# Patient Record
Sex: Male | Born: 1935 | Race: White | Hispanic: No | Marital: Married | Smoking: Never smoker
Health system: Southern US, Community
[De-identification: ages and names within clinical notes are randomized; demographics above are authoritative.]

## PROBLEM LIST (undated history)

## (undated) DIAGNOSIS — I639 Cerebral infarction, unspecified: Secondary | ICD-10-CM

## (undated) DIAGNOSIS — J449 Chronic obstructive pulmonary disease, unspecified: Secondary | ICD-10-CM

## (undated) DIAGNOSIS — Z87442 Personal history of urinary calculi: Secondary | ICD-10-CM

## (undated) DIAGNOSIS — I10 Essential (primary) hypertension: Secondary | ICD-10-CM

## (undated) DIAGNOSIS — K449 Diaphragmatic hernia without obstruction or gangrene: Secondary | ICD-10-CM

## (undated) DIAGNOSIS — Z950 Presence of cardiac pacemaker: Secondary | ICD-10-CM

## (undated) DIAGNOSIS — I2510 Atherosclerotic heart disease of native coronary artery without angina pectoris: Secondary | ICD-10-CM

## (undated) DIAGNOSIS — M1990 Unspecified osteoarthritis, unspecified site: Secondary | ICD-10-CM

## (undated) DIAGNOSIS — K922 Gastrointestinal hemorrhage, unspecified: Secondary | ICD-10-CM

## (undated) DIAGNOSIS — C801 Malignant (primary) neoplasm, unspecified: Secondary | ICD-10-CM

## (undated) DIAGNOSIS — K222 Esophageal obstruction: Secondary | ICD-10-CM

## (undated) DIAGNOSIS — K219 Gastro-esophageal reflux disease without esophagitis: Secondary | ICD-10-CM

## (undated) DIAGNOSIS — I350 Nonrheumatic aortic (valve) stenosis: Secondary | ICD-10-CM

## (undated) DIAGNOSIS — F0390 Unspecified dementia without behavioral disturbance: Secondary | ICD-10-CM

## (undated) DIAGNOSIS — K5790 Diverticulosis of intestine, part unspecified, without perforation or abscess without bleeding: Secondary | ICD-10-CM

## (undated) DIAGNOSIS — E785 Hyperlipidemia, unspecified: Secondary | ICD-10-CM

## (undated) DIAGNOSIS — I509 Heart failure, unspecified: Secondary | ICD-10-CM

## (undated) HISTORY — DX: Hyperlipidemia, unspecified: E78.5

## (undated) HISTORY — DX: Essential (primary) hypertension: I10

## (undated) HISTORY — DX: Diverticulosis of intestine, part unspecified, without perforation or abscess without bleeding: K57.90

## (undated) HISTORY — DX: Unspecified osteoarthritis, unspecified site: M19.90

## (undated) HISTORY — DX: Diaphragmatic hernia without obstruction or gangrene: K44.9

## (undated) HISTORY — DX: Atherosclerotic heart disease of native coronary artery without angina pectoris: I25.10

## (undated) HISTORY — DX: Esophageal obstruction: K22.2

## (undated) HISTORY — DX: Cerebral infarction, unspecified: I63.9

## (undated) HISTORY — DX: Gastrointestinal hemorrhage, unspecified: K92.2

## (undated) HISTORY — PX: ARTERIOVENOUS GRAFT PLACEMENT W/ ENDOSCOPIC VEIN HARVEST: SUR1030

## (undated) HISTORY — PX: EYE SURGERY: SHX253

---

## 2004-06-09 ENCOUNTER — Inpatient Hospital Stay (HOSPITAL_COMMUNITY): Admission: RE | Admit: 2004-06-09 | Discharge: 2004-06-13 | Payer: Self-pay | Admitting: Orthopedic Surgery

## 2004-06-09 HISTORY — PX: OTHER SURGICAL HISTORY: SHX169

## 2006-08-23 DIAGNOSIS — I252 Old myocardial infarction: Secondary | ICD-10-CM | POA: Insufficient documentation

## 2007-06-16 ENCOUNTER — Ambulatory Visit: Payer: Self-pay | Admitting: Cardiology

## 2007-06-16 ENCOUNTER — Ambulatory Visit: Payer: Self-pay | Admitting: Cardiothoracic Surgery

## 2007-06-16 ENCOUNTER — Inpatient Hospital Stay (HOSPITAL_COMMUNITY): Admission: EM | Admit: 2007-06-16 | Discharge: 2007-06-25 | Payer: Self-pay | Admitting: *Deleted

## 2007-06-19 ENCOUNTER — Encounter (INDEPENDENT_AMBULATORY_CARE_PROVIDER_SITE_OTHER): Payer: Self-pay | Admitting: Neurology

## 2007-06-19 ENCOUNTER — Encounter: Payer: Self-pay | Admitting: Cardiothoracic Surgery

## 2007-06-21 HISTORY — PX: CORONARY ARTERY BYPASS GRAFT: SHX141

## 2007-07-12 ENCOUNTER — Ambulatory Visit: Payer: Self-pay | Admitting: Internal Medicine

## 2007-07-14 ENCOUNTER — Ambulatory Visit: Payer: Self-pay | Admitting: Cardiothoracic Surgery

## 2007-07-14 ENCOUNTER — Encounter: Admission: RE | Admit: 2007-07-14 | Discharge: 2007-07-14 | Payer: Self-pay | Admitting: Cardiothoracic Surgery

## 2007-10-31 ENCOUNTER — Encounter: Payer: Self-pay | Admitting: Internal Medicine

## 2007-10-31 ENCOUNTER — Ambulatory Visit: Payer: Self-pay | Admitting: Internal Medicine

## 2007-10-31 ENCOUNTER — Ambulatory Visit: Payer: Self-pay

## 2007-10-31 LAB — CONVERTED CEMR LAB
ALT: 12 units/L (ref 0–53)
AST: 15 units/L (ref 0–37)
Albumin: 3.5 g/dL (ref 3.5–5.2)
Alkaline Phosphatase: 49 units/L (ref 39–117)
BUN: 12 mg/dL (ref 6–23)
Bilirubin, Direct: 0.1 mg/dL (ref 0.0–0.3)
CO2: 31 meq/L (ref 19–32)
Calcium: 8.9 mg/dL (ref 8.4–10.5)
Chloride: 108 meq/L (ref 96–112)
Cholesterol: 130 mg/dL (ref 0–200)
Creatinine, Ser: 0.9 mg/dL (ref 0.4–1.5)
GFR calc Af Amer: 106 mL/min
GFR calc non Af Amer: 88 mL/min
Glucose, Bld: 119 mg/dL — ABNORMAL HIGH (ref 70–99)
HDL: 46.6 mg/dL (ref >39.0)
LDL Cholesterol: 59 mg/dL (ref 0–99)
Potassium: 4.5 meq/L (ref 3.5–5.1)
Sodium: 141 meq/L (ref 135–145)
Total Bilirubin: 0.6 mg/dL (ref 0.3–1.2)
Total CHOL/HDL Ratio: 2.8
Total Protein: 6.6 g/dL (ref 6.0–8.3)
Triglycerides: 120 mg/dL (ref 0–149)
VLDL: 24 mg/dL (ref 0–40)

## 2008-01-04 ENCOUNTER — Ambulatory Visit: Payer: Self-pay | Admitting: Internal Medicine

## 2008-05-10 ENCOUNTER — Ambulatory Visit: Payer: Self-pay | Admitting: Internal Medicine

## 2008-05-16 ENCOUNTER — Ambulatory Visit: Payer: Self-pay

## 2008-06-14 ENCOUNTER — Emergency Department (HOSPITAL_COMMUNITY): Admission: EM | Admit: 2008-06-14 | Discharge: 2008-06-14 | Payer: Self-pay | Admitting: Emergency Medicine

## 2009-02-05 ENCOUNTER — Ambulatory Visit: Payer: Self-pay | Admitting: Internal Medicine

## 2009-02-05 DIAGNOSIS — Z951 Presence of aortocoronary bypass graft: Secondary | ICD-10-CM | POA: Insufficient documentation

## 2009-02-05 DIAGNOSIS — I10 Essential (primary) hypertension: Secondary | ICD-10-CM | POA: Insufficient documentation

## 2009-02-07 ENCOUNTER — Encounter: Payer: Self-pay | Admitting: Internal Medicine

## 2009-06-28 ENCOUNTER — Telehealth: Payer: Self-pay | Admitting: Adult Health

## 2009-12-04 ENCOUNTER — Ambulatory Visit: Payer: Self-pay | Admitting: Internal Medicine

## 2009-12-04 DIAGNOSIS — R42 Dizziness and giddiness: Secondary | ICD-10-CM | POA: Insufficient documentation

## 2009-12-11 ENCOUNTER — Encounter: Payer: Self-pay | Admitting: Internal Medicine

## 2010-01-15 ENCOUNTER — Ambulatory Visit: Payer: Self-pay | Admitting: Internal Medicine

## 2010-01-30 ENCOUNTER — Telehealth: Payer: Self-pay | Admitting: Internal Medicine

## 2010-02-23 ENCOUNTER — Encounter: Payer: Self-pay | Admitting: Internal Medicine

## 2010-02-23 DIAGNOSIS — I2510 Atherosclerotic heart disease of native coronary artery without angina pectoris: Secondary | ICD-10-CM | POA: Insufficient documentation

## 2010-02-24 ENCOUNTER — Encounter: Payer: Self-pay | Admitting: Internal Medicine

## 2010-02-24 HISTORY — PX: COLONOSCOPY: SHX174

## 2010-02-25 ENCOUNTER — Encounter: Payer: Self-pay | Admitting: Internal Medicine

## 2010-02-26 ENCOUNTER — Telehealth: Payer: Self-pay | Admitting: Internal Medicine

## 2010-02-27 ENCOUNTER — Encounter (INDEPENDENT_AMBULATORY_CARE_PROVIDER_SITE_OTHER): Payer: Self-pay | Admitting: *Deleted

## 2010-03-03 ENCOUNTER — Ambulatory Visit: Payer: Self-pay | Admitting: Internal Medicine

## 2010-03-03 DIAGNOSIS — K922 Gastrointestinal hemorrhage, unspecified: Secondary | ICD-10-CM | POA: Insufficient documentation

## 2010-03-03 DIAGNOSIS — D62 Acute posthemorrhagic anemia: Secondary | ICD-10-CM | POA: Insufficient documentation

## 2010-03-03 DIAGNOSIS — M1990 Unspecified osteoarthritis, unspecified site: Secondary | ICD-10-CM | POA: Insufficient documentation

## 2010-03-04 LAB — CONVERTED CEMR LAB
Basophils Absolute: 0 10*3/uL (ref 0.0–0.1)
Basophils Relative: 0.6 % (ref 0.0–3.0)
Eosinophils Absolute: 0.5 10*3/uL (ref 0.0–0.7)
Eosinophils Relative: 7 % — ABNORMAL HIGH (ref 0.0–5.0)
HCT: 27.8 % — ABNORMAL LOW (ref 39.0–52.0)
Hemoglobin: 9.5 g/dL — ABNORMAL LOW (ref 13.0–17.0)
Lymphocytes Relative: 28.6 % (ref 12.0–46.0)
Lymphs Abs: 1.9 10*3/uL (ref 0.7–4.0)
MCHC: 34.3 g/dL (ref 30.0–36.0)
MCV: 96.1 fL (ref 78.0–100.0)
Monocytes Absolute: 0.7 10*3/uL (ref 0.1–1.0)
Monocytes Relative: 10.3 % (ref 3.0–12.0)
Neutro Abs: 3.5 10*3/uL (ref 1.4–7.7)
Neutrophils Relative %: 53.5 % (ref 43.0–77.0)
Platelets: 347 10*3/uL (ref 150.0–400.0)
RBC: 2.89 M/uL — ABNORMAL LOW (ref 4.22–5.81)
RDW: 14.8 % — ABNORMAL HIGH (ref 11.5–14.6)
WBC: 6.6 10*3/uL (ref 4.5–10.5)

## 2010-07-30 ENCOUNTER — Ambulatory Visit: Payer: Self-pay | Admitting: Internal Medicine

## 2010-07-30 ENCOUNTER — Encounter: Payer: Self-pay | Admitting: Internal Medicine

## 2010-08-07 ENCOUNTER — Telehealth: Payer: Self-pay | Admitting: Internal Medicine

## 2010-08-07 ENCOUNTER — Encounter: Payer: Self-pay | Admitting: Physician Assistant

## 2010-08-07 ENCOUNTER — Ambulatory Visit: Payer: Self-pay | Admitting: Physician Assistant

## 2010-08-07 DIAGNOSIS — R05 Cough: Secondary | ICD-10-CM

## 2010-08-07 DIAGNOSIS — R059 Cough, unspecified: Secondary | ICD-10-CM | POA: Insufficient documentation

## 2010-08-13 ENCOUNTER — Telehealth: Payer: Self-pay | Admitting: Internal Medicine

## 2010-08-21 ENCOUNTER — Ambulatory Visit: Payer: Self-pay | Admitting: Cardiovascular Disease

## 2010-08-25 LAB — CONVERTED CEMR LAB
BUN: 17 mg/dL (ref 6–23)
CO2: 28 meq/L (ref 19–32)
Calcium: 9.2 mg/dL (ref 8.4–10.5)
Chloride: 105 meq/L (ref 96–112)
Creatinine, Ser: 1 mg/dL (ref 0.4–1.5)
GFR calc non Af Amer: 74.55 mL/min (ref >60.00)
Glucose, Bld: 112 mg/dL — ABNORMAL HIGH (ref 70–99)
Potassium: 4.7 meq/L (ref 3.5–5.1)
Sodium: 138 meq/L (ref 135–145)

## 2010-09-08 ENCOUNTER — Encounter: Payer: Self-pay | Admitting: Physician Assistant

## 2010-09-11 ENCOUNTER — Other Ambulatory Visit: Payer: Self-pay | Admitting: Internal Medicine

## 2010-09-11 ENCOUNTER — Ambulatory Visit
Admission: RE | Admit: 2010-09-11 | Discharge: 2010-09-11 | Payer: Self-pay | Source: Home / Self Care | Attending: Internal Medicine | Admitting: Internal Medicine

## 2010-09-11 ENCOUNTER — Ambulatory Visit: Admission: RE | Admit: 2010-09-11 | Discharge: 2010-09-11 | Payer: Self-pay | Source: Home / Self Care

## 2010-09-11 ENCOUNTER — Encounter: Payer: Self-pay | Admitting: Internal Medicine

## 2010-09-11 LAB — CBC WITH DIFFERENTIAL/PLATELET
Basophils Absolute: 0 10*3/uL (ref 0.0–0.1)
Basophils Relative: 0.5 % (ref 0.0–3.0)
Eosinophils Absolute: 0.2 10*3/uL (ref 0.0–0.7)
Eosinophils Relative: 3.3 % (ref 0.0–5.0)
HCT: 36.9 % — ABNORMAL LOW (ref 39.0–52.0)
Hemoglobin: 12.7 g/dL — ABNORMAL LOW (ref 13.0–17.0)
Lymphocytes Relative: 35.3 % (ref 12.0–46.0)
Lymphs Abs: 2.1 10*3/uL (ref 0.7–4.0)
MCHC: 34.5 g/dL (ref 30.0–36.0)
MCV: 98.2 fl (ref 78.0–100.0)
Monocytes Absolute: 0.6 10*3/uL (ref 0.1–1.0)
Monocytes Relative: 9.7 % (ref 3.0–12.0)
Neutro Abs: 3 10*3/uL (ref 1.4–7.7)
Neutrophils Relative %: 51.2 % (ref 43.0–77.0)
Platelets: 298 10*3/uL (ref 150.0–400.0)
RBC: 3.76 Mil/uL — ABNORMAL LOW (ref 4.22–5.81)
RDW: 14.1 % (ref 11.5–14.6)
WBC: 5.9 10*3/uL (ref 4.5–10.5)

## 2010-09-11 LAB — BASIC METABOLIC PANEL
BUN: 14 mg/dL (ref 6–23)
CO2: 29 mEq/L (ref 19–32)
Calcium: 9 mg/dL (ref 8.4–10.5)
Chloride: 107 mEq/L (ref 96–112)
Creatinine, Ser: 0.7 mg/dL (ref 0.4–1.5)
GFR: 124.58 mL/min (ref >60.00)
Glucose, Bld: 113 mg/dL — ABNORMAL HIGH (ref 70–99)
Potassium: 4.2 mEq/L (ref 3.5–5.1)
Sodium: 142 mEq/L (ref 135–145)

## 2010-09-11 LAB — LIPID PANEL
Cholesterol: 176 mg/dL (ref 0–200)
HDL: 46.7 mg/dL (ref >39.00)
LDL Cholesterol: 106 mg/dL — ABNORMAL HIGH (ref 0–99)
Total CHOL/HDL Ratio: 4
Triglycerides: 116 mg/dL (ref 0.0–149.0)
VLDL: 23.2 mg/dL (ref 0.0–40.0)

## 2010-09-11 LAB — HEPATIC FUNCTION PANEL
ALT: 14 U/L (ref 0–53)
AST: 20 U/L (ref 0–37)
Albumin: 4 g/dL (ref 3.5–5.2)
Alkaline Phosphatase: 41 U/L (ref 39–117)
Bilirubin, Direct: 0.1 mg/dL (ref 0.0–0.3)
Total Bilirubin: 0.8 mg/dL (ref 0.3–1.2)
Total Protein: 6.8 g/dL (ref 6.0–8.3)

## 2010-09-20 LAB — CONVERTED CEMR LAB
ALT: 15 units/L (ref 0–53)
AST: 21 units/L (ref 0–37)
Albumin: 3.6 g/dL (ref 3.5–5.2)
Alkaline Phosphatase: 43 units/L (ref 39–117)
BUN: 9 mg/dL (ref 6–23)
Bilirubin, Direct: 0.1 mg/dL (ref 0.0–0.3)
CO2: 31 meq/L (ref 19–32)
Calcium: 8.4 mg/dL (ref 8.4–10.5)
Chloride: 101 meq/L (ref 96–112)
Cholesterol, target level: 200 mg/dL
Cholesterol: 105 mg/dL (ref 0–200)
Creatinine, Ser: 0.8 mg/dL (ref 0.4–1.5)
GFR calc non Af Amer: 100.21 mL/min (ref >60)
Glucose, Bld: 129 mg/dL — ABNORMAL HIGH (ref 70–99)
HDL goal, serum: 40 mg/dL
HDL: 43.7 mg/dL (ref >39.00)
LDL Cholesterol: 53 mg/dL (ref 0–99)
LDL Goal: 100 mg/dL
Potassium: 3.5 meq/L (ref 3.5–5.1)
Sodium: 142 meq/L (ref 135–145)
Total Bilirubin: 0.7 mg/dL (ref 0.3–1.2)
Total CHOL/HDL Ratio: 2
Total Protein: 6.4 g/dL (ref 6.0–8.3)
Triglycerides: 44 mg/dL (ref 0.0–149.0)
VLDL: 8.8 mg/dL (ref 0.0–40.0)

## 2010-09-22 NOTE — Letter (Signed)
Summary: [facility]  [address] [place], [place] [address]   Phone: [phone]  Fax: [phone]    02/27/2010  RE:  Blake Burgess   [address]   [place], [place]  [address]   DOB: 07-Jun-1932  TO:   Health Information   [facility]   FAX: [phone]   PHONE: [phone]  Please send any and all History and Physical, Discharge Summaries, labs, colonoscopy reports, EGD reports, radiology reports, and pathology reports pertaining to recent ER visit and admission on or around 02/20/2010.  THIS PATIENT IS BEING SEEN IN OUR OFFICE BY DR. CARL GESSNER ON 03/03/10.  Please fax these records to:  Blake Burgess, CMA [facility])  FAX:   [phone] PHONE:[phone] (direct)  Sincerely,   Blake Burgess CMA [facility])   Appended Document: [facility] Records Request Letter faxed on 02/27/10...records received on 03/02/10.

## 2010-09-22 NOTE — Procedures (Signed)
Summary: Colon/[facility]  Colon/[facility]   Imported By: Lester [place] 03/17/2010 10:54:13  _____________________________________________________________________  External Attachment:    Type:   Image     Comment:   External Document

## 2010-09-22 NOTE — Assessment & Plan Note (Signed)
Summary: RECTAL BLEEDING//SEEN AT [facility]    History of Present Illness Visit Type: Initial Consult Primary GI MD: Stan Head MD [facility] Primary Provider: Tommas Olp, MD Requesting Provider: Arvilla Meres, MD Chief Complaint: Rectal Bleeding/Seen at [facility] History of Present Illness:   77 yo white man that developed a large amount of painless hematochezia on 02/23/10, was seen at [facility] and then transferred to [facility]. EGD and colonoscopy demonstrated  a Schatzki's ring, hiatal hernia, colonoic diverticulosis and internal hemorrhoids.He was tranfused 4 units of blood between [facility] and [facility]. He denies seeing any blood in his stool since discharge on July 7th. He believes that the clinical impression was that of a diverticular bleed.  He is weak and dyspneic at times, still trying to maintain activity level as before the bleed. Wife proviodes some history. No chest pain. Plavix and Arthrotec have been held. Arthrotec was started 1 week before the bleeding began. On 81 mg ASA now.   GI Review of Systems      Denies abdominal pain, acid reflux, belching, bloating, chest pain, dysphagia with liquids, dysphagia with solids, heartburn, loss of appetite, nausea, vomiting, vomiting blood, weight loss, and  weight gain.        Denies anal fissure, black tarry stools, change in bowel habit, constipation, diarrhea, diverticulosis, fecal incontinence, heme positive stool, hemorrhoids, irritable bowel syndrome, jaundice, light color stool, liver problems, rectal bleeding, and  rectal pain. Colonoscopy  Procedure date:  02/23/2010  Findings:      Diverticulosis throughout colon Internal Hemorrhoids Normal terminal ileum  Dr. Jovita Gamma, [facility]  EGD  Procedure date:  02/23/2010  Findings:      Scahtzki's ring Hiatal hernia  Dr. Leticia Penna     Current Medications (verified): 1)  Aspirin 81 Mg Tbec (Aspirin) .... Take One Tablet By Mouth Daily 2)  Ramipril 5 Mg  Caps (Ramipril) .... Take Two By Mouth Once Daily 3)  Crestor 5 Mg Tabs (Rosuvastatin Calcium) .... Take One Tablet By Mouth Daily. 4)  Metoprolol Succinate 50 Mg Xr24h-Tab (Metoprolol Succinate) .... Take One Tablet By Mouth Daily 5)  Omeprazole 20 Mg Cpdr (Omeprazole) .... Take One Tablet By Mouth Once Daily.  Allergies (verified): No Known Drug Allergies  Past History:  Past Medical History: Hypertension Hyperlipidemia CAD s/p CABG 2008       --EF 50-55%       --Myoview 9/09 EF 53%. ? trivial inferobasilar ischemia vs gut artifact Schatzki Ring Hiatus Hernia Diverticulosis Hemorrhoids Anemia Stroke "Light" GI bleed 02/23/10 - diverticulosis suspected Osteoarthritis  Past Surgical History: Reviewed history from 11/21/2008 and no changes required. 06/21/2007  OPERATION:   1. Coronary artery bypass grafting x3 (left internal mammary artery to       LAD, saphenous vein graft to obtuse marginal, saphenous vein graft       to right coronary artery).   2. Endoscopic vein harvest of the right leg greater saphenous vein.     SURGEON:  Kerin Perna, M.D.   06/09/2004 PROCEDURE:  Left total hip replacement.  Components utilized are the Dupuy  hip system with a size 54 Pinnacle cup, 36 neutral liner, two cancellus bone  screws for the cup, an S-ROM hip system with a #[id number], #[id number] sleeve, 36  plus 12 offset stem with a 36 neutral plus 0 ball.  SURGEON:  Madlyn Frankel. Charlann Boxer, M.D.        Family History: His mother died at the age of 29 with a myocardial  infarction, history of hypertension.   His father died in the 85s of old   age.  History of diabetes.   He has one brother and one two sisters   living.   One brother has a history of diabetes.   He has three brothers   deceased, one sister deceased of unknown causes. Sister with diabetes.    No FH of Colon Cancer:  Social History: Married one son and one daughter Tobacco Use - No.  Alcohol Use - no Regular Exercise  - no Drug Use - no Daily Caffeine Use 24 oz pepsi and 1 cup coffee  Review of Systems       The patient complains of anemia, arthritis/joint pain, change in vision, fatigue, and shortness of breath.         All other ROS negative except as per HPI.   Vital Signs:  Patient profile:   77 year old male Height:      68 inches Weight:      178.8 pounds BMI:     27.28 Pulse rate:   52 / minute Pulse rhythm:   irregular BP sitting:   122 / 52  (left arm) Cuff size:   regular  Vitals Entered By: Harlow Mares CMA [facility]) (March 03, 2010 8:56 AM)  Physical Exam  General:  Well developed, well nourished, no acute distress. Eyes:  PERRLA, no icterus. Mouth:  No deformity or lesions, dentition normal. Neck:  Supple; no masses or thyromegaly. Lungs:  Clear throughout to auscultation. Heart:  Regular rate and rhythm; no murmurs, rubs,  or bruits. Abdomen:  Soft, nontender and nondistended. No masses, hepatosplenomegaly or hernias noted. Normal bowel sounds. Extremities:  no edema Neurologic:  Alert and  oriented x4;  grossly normal neurologically. Cervical Nodes:  No significant cervical or supraclavicular adenopathy.  Psych:  Alert and cooperative. Normal mood and affect.   Impression & Recommendations:  Problem # 1:  GI BLEED (ICD-578.9) Assessment New sudden painless large volume requiring 4 units work-up suggests diverticular bleed ? if arthrotec causative, maybe but not necessarily so, but certainly temporally associated as started 1 week before  He is to stay off arthrotec and Plavix for now and 2 weeks after 7/4 at least defer to other clinicians about restarting those Retrial of arthrotec not contraindicated but observe carefully for other events as he is on daily PPI the increased benefit of Arthrotec of NSAID alone is questionable  As far as NSAIDs are concerned may have deleterious cardiac effects so non-NSAID Tx of OA may be best overall  Problem # 2:  ANEMIA,  SECONDARY TO ACUTE BLOOD LOSS (ICD-285.1) Assessment: New Hgb 9.8 on 7/6 undoubtedly part or all of his dyspnea and weakness  Orders: TLB-CBC Platelet - w/Differential (85025-CBCD)  Problem # 3:  OSTEOARTHRITIS (ICD-715.90) Assessment: New try acetaminophen again he could retry arthrotec?? but see issues under GI bleed A/P  Problem # 4:  CAD, NATIVE VESSEL (ICD-414.01) Assessment: Unchanged Ok to reatsrt Plavix on 7/21 if there is a need could rebleed, he is aware but if Dr. Olevia Bowens he needs the Plavix then restart and see  Patient Instructions: 1)  Please go to the basement to have your lab tests drawn today. CBC 2)  We will call you with further follow up after reviewing these results.  3)  Please limit your activity to 1/2 of your normal. 4)  Please keep follow up appointment with Dr. Charmian Muff. 5)  Please schedule a follow-up appointment as needed.  6)  Copy sent to : Tommas Olp, MD, Arvilla Meres, MD 7)  The medication list was reviewed and reconciled.  All changed / newly prescribed medications were explained.  A complete medication list was provided to the patient / caregiver. Patient: Blake Burgess Note: All result statuses are Final unless otherwise noted.  Tests: (1) CBC Platelet w/Diff (CBCD)   White Cell Count          6.6 K/uL                    4.5-10.5   Red Cell Count       [L]  2.89 Mil/uL                 4.22-5.81   Hemoglobin           [L]  9.5 g/dL                    16.1-09.6   Hematocrit           [L]  27.8 %                      39.0-52.0   MCV                       96.1 fl                     78.0-100.0   MCHC                      34.3 g/dL                   04.5-40.9   RDW                  [H]  14.8 %                      11.5-14.6   Platelet Count            347.0 K/uL                  150.0-400.0   Neutrophil %              53.5 %                      43.0-77.0   Lymphocyte %              28.6 %                      12.0-46.0   Monocyte %                 10.3 %                      3.0-12.0   Eosinophils%         [H]  7.0 %                       0.0-5.0   Basophils %               0.6 %                       0.0-3.0   Neutrophill Absolute      3.5 K/uL  1.4-7.7   Lymphocyte Absolute       1.9 K/uL                    0.7-4.0   Monocyte Absolute         0.7 K/uL                    0.1-1.0  Eosinophils, Absolute                             0.5 K/uL                    0.0-0.7   Basophils Absolute        0.0 K/uL                    0.0-0.1  Note: An exclamation mark (!) indicates a result that was not dispersed into the flowsheet. Document Creation Date: 03/03/2010 10:36 AM _______________________________________________________________________  HE WILL START FERROUS SULFATE 325 MG two times a day AND FIOLLOW-UP WITH PCP AND DR. Gala Romney. WE WILL COMMUNICATE TO HIM.  Appended Document: RECTAL BLEEDING//SEEN AT [facility] Does not have stents; can stop Plavix and continue ASA only.   Appended Document: RECTAL BLEEDING//SEEN AT [facility] notify patient re: Dr. Kandis Mannan recommendations and also cc PCP  Appended Document: RECTAL BLEEDING//SEEN AT [facility] I have left a message for the patient to stay off Plavix.  I have asked him to call back for any questions.

## 2010-09-22 NOTE — Assessment & Plan Note (Signed)
Summary: f6w      Allergies Added: NKDA  Visit Type:  Follow-up Primary Provider:  Tommas Olp MD  CC:  fatigue.  History of Present Illness: Blake Burgess is a delightful 77 year old male with history of hypertension, hyperlipidemia, and previous stroke.  He also has a history of coronary artery disease and underwent three-vessel bypass surgery in October 2008 and ejection fraction has been in the 50-55%.  We saw him last month with severe dizziness and vertigo. BP was a little soft. We stopped Maxide and referred for stone repositioning. Dizziness much better. Only bothers him when he looks up. Had some swelling after the dentist gave him some medicine and called Ms. Brewer who told him to take [facility] for 3 days and it resolved. Has not come back since. Other wise doing well. No CP or SOB. Very active though he says he feels tired.  Current Medications (verified): 1)  Aspirin 81 Mg Tbec (Aspirin) .... Take One Tablet By Mouth Daily 2)  Ramipril 5 Mg Caps (Ramipril) .... Take One Capsule By Mouth Daily 3)  Crestor 5 Mg Tabs (Rosuvastatin Calcium) .... Take One Tablet By Mouth Daily. 4)  Plavix 75 Mg Tabs (Clopidogrel Bisulfate) .... Take One Tablet By Mouth Daily 5)  Metoprolol Succinate 50 Mg Xr24h-Tab (Metoprolol Succinate) .... Take One Tablet By Mouth Daily 6)  Omeprazole 20 Mg Cpdr (Omeprazole) .... Take One Tablet By Mouth Once Daily.  Allergies (verified): No Known Drug Allergies  Past History:  Past Medical History: Last updated: 02/05/2009  1. Hypertension  2. Hyperlipidemia  3. Previous   4. CAD s/p CABG 2008       --EF 50-55%       --Myoview 9/09 EF 53%. ? triviail inferobasilar ischemia vs gut artifact  Review of Systems       As per HPI and past medical history; otherwise all systems negative.   Vital Signs:  Patient profile:   77 year old male Height:      68 inches Weight:      178 pounds BMI:     27.16 Pulse rate:   50 / minute Resp:     16 per minute BP  sitting:   130 / 64  (right arm)  Vitals Entered By: Marrion Coy, CNA (Jan 15, 2010 11:03 AM)  Physical Exam  General:  Gen: well appearing. no resp difficulty HEENT: normal Neck: supple. no JVD. Carotids 2+ bilat; not bruits. No lymphadenopathy or thryomegaly appreciated. Cor: PMI nondisplaced. Regular rate & rhythm. No rubs, gallops, murmur. Lungs: clear Abdomen: soft, nontender, nondistended. No hepatosplenomegaly. No bruits or masses. Good bowel sounds. Extremities: no cyanosis, clubbing, rash, edema Neuro: alert & orientedx3, cranial nerves grossly intact. moves all 4 extremities w/o difficulty. affect pleasant    Impression & Recommendations:  Problem # 1:  DIZZINESS (ICD-780.4) Much improved. Suspect primarily vertigo. Could have component of basilar artery stenosis but MRA does not support this. Continue current therapy. If getting worse again can f/u with neurology.  Problem # 2:  CAD, NATIVE VESSEL (ICD-414.01) .Stable. No evidence of ischemia. Continue current regimen.  Problem # 3:  HYPERTENSION, BENIGN (ICD-401.1)  Blood pressure well controlled. Continue current regimen.  The following medications were removed from the medication list:    Triamterene-hctz 37.5-25 Mg Tabs (Triamterene-hctz) ..... Once daily His updated medication list for this problem includes:    Aspirin 81 Mg Tbec (Aspirin) .Marland Kitchen... Take one tablet by mouth daily    Ramipril 5 Mg Caps (Ramipril) .Marland KitchenMarland KitchenMarland KitchenMarland Kitchen  Take one capsule by mouth daily    Metoprolol Succinate 50 Mg Xr24h-tab (Metoprolol succinate) .Marland Kitchen... Take one tablet by mouth daily  Other Orders: EKG w/ Interpretation (93000) Pt did not have an EKG this day, this was put in in error.  Sander Nephew, RN  Patient Instructions: 1)  .Your physician recommends that you schedule a follow-up appointment in: 6 months   2)  Your physician recommends that you continue on your current medications as directed. Please refer to the Current Medication list  given to you today.

## 2010-09-22 NOTE — Progress Notes (Signed)
Summary: pt needs appt asap/retal bleeding   Phone Note Call from Patient Call back at Home Phone [phone]   Caller: Spouse/Treva Reason for Call: Talk to Nurse, Talk to Doctor Summary of Call: pt was in the hospital for rectal bleeding and was told to call and get appt since he was on coumadin Initial call taken by: Omer Jack,  February 26, 2010 9:21 AM  Follow-up for Phone Call        started bleeding Sun pm he went to [facility] and was sent to [facility], got 4 units of blood and stop coumadin, had endo and colonoscopy which didn't show anything, they told him it was from his Plavix and to f/u w/us, haven't heard of Plavix causing bleeding but can make bleeding worse, will discuss w/Dr Aftan Vint and call back Meredith Staggers, RN  February 26, 2010 9:44 AM   Additional Follow-up for Phone Call Additional follow up Details #1::        Would hold Plavix for now and have him see [facility] to cosnider capsule endoscopy. Dolores Patty, MD, [facility]  February 26, 2010 4:50 PM  pts wife aware, will call tom to sch GI appt Meredith Staggers, RN  February 26, 2010 6:30 PM   spoke w/Stephanie in GI,  Tue 7/12 at 9am w/Dr Leone Payor, pts wife aware Meredith Staggers, RN  February 27, 2010 10:40 AM

## 2010-09-22 NOTE — Procedures (Signed)
Summary: Upper GI[facility]  [facility]   Imported By: Lester [place] 03/17/2010 10:45:08  _____________________________________________________________________  External Attachment:    Type:   Image     Comment:   External Document

## 2010-09-22 NOTE — Progress Notes (Signed)
Summary: B/p running high/   FYI   Phone Note Call from Patient Call back at Home Phone [phone]   Caller: Spouse/treva Summary of Call: Pt b/p running high 174/75 this morning took in left arm Initial call taken by: Judie Grieve,  January 30, 2010 8:42 AM  Follow-up for Phone Call        Spoke with wife. Pt laying down.  states he took his AM meds this am about 6:45 and at 8am BP was elevated as noted.  States that pt is very dizzy and not feeling well.  Pt did see Tommas Olp, NP with Dr Mikey Bussing in [facility] on Monday who made medication changes.  Sugguested wife call to speak with Gigi Gin since she is the practioner that made the changes.  Wife was reluctant so I called.  RN there states she had already spoke the wife this am and given pt an appointment for 9:15 (it was 9:15 when I was speaking with her).  RN (Linda)states she will call pt back to follow up and let us know the outcome.  Attempted to call pt but line busy. Follow-up by: Charolotte Capuchin, RN,  January 30, 2010 9:22 AM  Additional Follow-up for Phone Call Additional follow up Details #1::        Spoke with Bonita Quin from Dr Neita Garnet office.  pt never showed for an appointment.  She has tried to call pt as well but line has been busy. Called patient and left message on machine that Dr Neita Garnet office would like to see pt and they are to call them for an appointment. Additional Follow-up by: Charolotte Capuchin, RN,  January 30, 2010 10:58 AM

## 2010-09-22 NOTE — Letter (Signed)
Summary: [facility]   Imported By: Lester [place] 03/17/2010 10:53:13  _____________________________________________________________________  External Attachment:    Type:   Image     Comment:   External Document

## 2010-09-22 NOTE — Assessment & Plan Note (Signed)
Summary: rov/blood pressure/mj  Medications Added METOPROLOL SUCCINATE 100 MG XR24H-TAB (METOPROLOL SUCCINATE) 1 tab once daily ENALAPRIL MALEATE 20 MG TABS (ENALAPRIL MALEATE) 1 tab two times a day TYLENOL ARTHRITIS PAIN 650 MG CR-TABS (ACETAMINOPHEN) 1 tab once daily SPIRONOLACTONE 25 MG TABS (SPIRONOLACTONE) Take 1/2 tablet daily      Allergies Added: NKDA  Visit Type:  rov Referring Provider:  Arvilla Meres, MD Primary Provider:  Tommas Olp, MD  CC:  sob at times...pt states he has terrible pain his hips all the time....denies any other complaints .  History of Present Illness: Blake Burgess is a delightful 77 year old male with history of hypertension, hyperlipidemia, and previous stroke.  He also has a history of coronary artery disease and underwent three-vessel bypass surgery in October 2008 and ejection fraction has been in the 50-55%.  We saw him in the Spring and he had severe dizziness and vertigo. BP was a little soft. We stopped Maxide and referred for stone repositioning. Dizziness much better.   Over past month or so has had problems with BPs being very high. SBP range 152-215. Saw Ms. Brewer last week and metoprolol and enalapril increased again (Dr. Mikey Bussing increased a few months before). Has not seen much of a benefit. Otherwise doing very well. Mild dyspnea especially when bedning over to tie shoes. No CP. Says he doesn't each much salt but had stir fry last night. Denies LE ede,ma but rings tight. +arthritis pain in hips but still very active.   Current Medications (verified): 1)  Aspirin 81 Mg Tbec (Aspirin) .... Take One Tablet By Mouth Daily 2)  Crestor 5 Mg Tabs (Rosuvastatin Calcium) .... Take One Tablet By Mouth Daily. 3)  Metoprolol Succinate 100 Mg Xr24h-Tab (Metoprolol Succinate) .Marland Kitchen.. 1 Tab Once Daily 4)  Omeprazole 20 Mg Cpdr (Omeprazole) .... Take One Tablet By Mouth Once Daily. 5)  Enalapril Maleate 20 Mg Tabs (Enalapril Maleate) .Marland Kitchen.. 1 Tab Two Times A  Day 6)  Tylenol Arthritis Pain 650 Mg Cr-Tabs (Acetaminophen) .Marland Kitchen.. 1 Tab Once Daily  Allergies (verified): No Known Drug Allergies  Past History:  Past Medical History: Last updated: 03/03/2010 Hypertension Hyperlipidemia CAD s/p CABG 2008       --EF 50-55%       --Myoview 9/09 EF 53%. ? trivial inferobasilar ischemia vs gut artifact Schatzki Ring Hiatus Hernia Diverticulosis Hemorrhoids Anemia Stroke "Light" GI bleed 02/23/10 - diverticulosis suspected Osteoarthritis  Review of Systems       As per HPI and past medical history; otherwise all systems negative.   Vital Signs:  Patient profile:   77 year old male Height:      68 inches Weight:      179.50 pounds BMI:     27.39 Pulse rate:   62 / minute Pulse rhythm:   regular BP sitting:   190 / 80  (left arm) Cuff size:   regular  Vitals Entered By: Danielle Rankin, CMA (July 30, 2010 8:34 AM)  Physical Exam  General:  well appearing. elderly male. walks briskly no resp difficulty HEENT: normal Neck: supple. no JVP 5-6. Carotids 2+ bilat; no bruits. No lymphadenopathy or thryomegaly appreciated. Cor: PMI nondisplaced. Regular rate & rhythm. No rubs, murmur. +s4 Lungs: clear Abdomen: soft, nontender, nondistended. No hepatosplenomegaly. No bruits or masses. Good bowel sounds. Extremities: no cyanosis, clubbing, rash, edema Neuro: alert & orientedx3, cranial nerves grossly intact. moves all 4 extremities w/o difficulty. affect pleasant    Impression & Recommendations:  Problem # 1:  CAD,  NATIVE VESSEL (ICD-414.01) Stable. No evidence of ischemia. Continue current regimen.  Problem # 2:  HYPERTENSION, BENIGN (ICD-401.1) BP markedly elevated. Suspect he may be salt sensitive. Have asked him to limit salt intake. Will add spiro 12.5. Have called Ms. Brewer's nurse and asked her to draw BMET next week. Will see back in 1 month with renal artery ultrasound to r/o renal artery stenosis. If BP remains elevated can  titrate spiro to 25 once daily and/or change enalapril to Lotrel (benazepril 20/amlodipine 5)  Other Orders: EKG w/ Interpretation (93000) Renal Artery Duplex (Renal Artery Duplex)  Patient Instructions: 1)  Your physician recommends that you schedule a follow-up appointment in: 1 month with Dr. Gala Romney  January 20,12 at 11:30am with renal artery ultrasound 2)  Your physician has recommended you make the following change in your medication: Spironolactone Prescriptions: SPIRONOLACTONE 25 MG TABS (SPIRONOLACTONE) Take 1/2 tablet daily  #30 x 11   Entered by:   Lisabeth Devoid RN   Authorized by:   Dolores Patty, MD, [facility]   Signed by:   Lisabeth Devoid RN on 07/30/2010   Method used:   Electronically to        [facility]* (retail)       [address]       [place], [place]  [address]       Ph: [phone]       Fax: [phone]   RxID:   [id number]

## 2010-09-22 NOTE — Assessment & Plan Note (Signed)
Summary: pt was seen at [facility] and was told pt is ORTHOSTATIC and he...  Medications Added TRIAMTERENE-HCTZ 37.5-25 MG TABS (TRIAMTERENE-HCTZ) once daily      Allergies Added: NKDA  Visit Type:  Follow-up Primary Provider:  Tommas Olp MD  CC:  dizziness.  History of Present Illness: Gregrey is a delightful 77 year old male with history of hypertension, hyperlipidemia, and previous stroke.  He also has a history of coronary artery disease and underwent three-vessel bypass surgery in October 2008 and ejection fraction has been in the 50-55%.  Doing ok. But over past month or two notes that often when he stands up feels dizzy and lightheaded. Sits down and goes away. Ocasioanlly room spins. Can happen 3-4x/day. Has not passed out. Saw PCP and ENT and they thought he might have brain tumor or inner ear problem. Tried meclizine without relief. Also started maxizide. Had MRI which was fine but apparently MRA showed some blockages. Saw Dr. Venetia Maxon who referred him back here. Never dizzy when walking.   Says dizziness is abolute worst when he lays on his back at night and turns onto his side. Rooms starts spinning.  Feels tired. Denies dyspnea or CP. Rare headache.   Had orhtostatics today: Lying 131/76  HR 71  Standing 129/64 78 (dizzy)   Lipid Management History:      Positive NCEP/ATP III risk factors include male age 18 years old or older, hypertension, and ASHD (either angina/prior MI/prior CABG).  Negative NCEP/ATP III risk factors include non-tobacco-user status.    Current Medications (verified): 1)  Aspirin 81 Mg Tbec (Aspirin) .... Take One Tablet By Mouth Daily 2)  Ramipril 5 Mg Caps (Ramipril) .... Take One Capsule By Mouth Daily 3)  Crestor 5 Mg Tabs (Rosuvastatin Calcium) .... Take One Tablet By Mouth Daily. 4)  Plavix 75 Mg Tabs (Clopidogrel Bisulfate) .... Take One Tablet By Mouth Daily 5)  Metoprolol Succinate 50 Mg Xr24h-Tab (Metoprolol Succinate) .... Take One Tablet By  Mouth Daily 6)  Omeprazole 20 Mg Cpdr (Omeprazole) .... Take One Tablet By Mouth Once Daily. 7)  Triamterene-Hctz 37.5-25 Mg Tabs (Triamterene-Hctz) .... Once Daily  Allergies (verified): No Known Drug Allergies  Past History:  Past Medical History: Last updated: 02/05/2009  1. Hypertension  2. Hyperlipidemia  3. Previous   4. CAD s/p CABG 2008       --EF 50-55%       --Myoview 9/09 EF 53%. ? triviail inferobasilar ischemia vs gut artifact  Vital Signs:  Patient profile:   77 year old male Height:      68 inches Weight:      172 pounds BMI:     26.25 Pulse rate:   69 / minute Pulse (ortho):   78 / minute BP standing:   128 / 64  Vitals Entered By: Hardin Negus, RMA (December 04, 2009 4:19 PM)  Serial Vital Signs/Assessments:  Time      Position  BP       Pulse  Resp  Temp     By           Lying RA  131/76   [address] [place]           Sitting   127/69   69                    Hardin Negus, [place]  Standing  128/64   78                    Sherri Bascom Levels, RMA  Comments: 2 mins - BP 141/75  P 79 4 mins - BP 128/ 70  P 75 upon sitting ws just a little lightheaded and then upon standing was dizzy, stumbled and then was better By: Hardin Negus, RMA     Impression & Recommendations:  Problem # 1:  DIZZINESS (ICD-780.4) Initially I thought problem would be orthostasis but orthostatics here were normal and he still became dizzy thus I think it truly may be vertigo. Will stop diuretic and refer back to ENT for stone repositioning. If symptoms continue will need to get copy of MRA to look for vertebro-basilar insufficiency. Can also consider holter moniotr, if needed though I think arrhtymia unlikely.   Problem # 2:  CAD, NATIVE VESSEL (ICD-414.01) Stable. No evidence of ischemia. Continue current regimen.  Other Orders: EKG w/ Interpretation (93000)  Lipid Assessment/Plan:      Based on NCEP/ATP III, the patient's risk factor category is  "history of coronary disease, peripheral vascular disease, cerebrovascular disease, or aortic aneurysm".  The patient's lipid goals are as follows: Total cholesterol goal is 200; LDL cholesterol goal is 100; HDL cholesterol goal is 40; Triglyceride goal is 150.

## 2010-09-24 NOTE — Progress Notes (Signed)
Summary: c/o b/p meds was changed making him feel  bad  Medications Added AMLODIPINE BESYLATE 5 MG TABS (AMLODIPINE BESYLATE) Take one tablet by mouth daily       Phone Note Call from Patient Call back at Home Phone [phone]   Caller: Patient Reason for Call: Talk to Nurse Summary of Call: per pt calling c/o b/p med was changed making pt feel bad.  Initial call taken by: Lorne Skeens,  August 13, 2010 8:26 AM  Follow-up for Phone Call        spoke w/pt and his wife, he saw Lorin Picket last Fri for elevated BP, his spiro was increased to 25mg  once daily and enalapril was changed to Diovan due to cough, since then he has cont. to feel bad w/increase in fatigue and BP cont. to run high.  When he 1st gets up BP is around 180-200s about 1-3 hours after his meds it only comes down to 170-180s over 70-90s HR in the 60s, he reports BP is elevated in afternoons as well.  They are both concerned b/c BP is not coming down and he feels bad, will discuss w/Dr Bensimhon and call pt back Meredith Staggers, RN  August 13, 2010 10:36 AM   Additional Follow-up for Phone Call Additional follow up Details #1::        per Dr Gala Romney add amlodipine 5mg  keep appt 12/30 w/Scott, pts wife aware rx sent in [facility], RN  August 13, 2010 3:33 PM     New/Updated Medications: AMLODIPINE BESYLATE 5 MG TABS (AMLODIPINE BESYLATE) Take one tablet by mouth daily Prescriptions: AMLODIPINE BESYLATE 5 MG TABS (AMLODIPINE BESYLATE) Take one tablet by mouth daily  #30 x 6   Entered by:   Meredith Staggers, RN   Authorized by:   Dolores Patty, MD, [facility]   Signed by:   Meredith Staggers, RN on 08/13/2010   Method used:   Electronically to        [facility]* (retail)       [address]       [place], [place]  [address]       Ph: [phone]       Fax: [phone]   RxID:   [id number]

## 2010-09-24 NOTE — Assessment & Plan Note (Signed)
Summary: OK PER PAT TO DOUB. BOOK/BP AND MED CHANGE/SAF  Medications Added DIOVAN 160 MG TABS (VALSARTAN) 2 tab qam as per pt      Allergies Added: NKDA  Visit Type:  2 wk f/u Referring Provider:  Arvilla Meres, MD Primary Provider:  Tommas Olp, MD  CC:  no cardiac complaints today.  History of Present Illness: Primary Cardiologist:  Dr. Arvilla Meres  [name] is a 77 year old male with history of hypertension, hyperlipidemia, and previous stroke.  He also has a history of coronary artery disease and underwent three-vessel bypass surgery in October 2008 and ejection fraction has been in the 50-55%.  I saw him recently for high BP.  I increased his spironolactone to 25 mg once daily and changed his ACE to Diovan due to cough.  He called back with continued elevated BPs and had norvasc added.  He returns for follow up.  Since he was last seen, his Diovan was increased to 320 mg a day.  In the last couple of days, his blood pressures at home have been better controlled.  He denies chest pain, shortness of breath, syncope.  He denies extremity edema.  He denies any change in his cough with switching to an ARB.  Current Medications (verified): 1)  Aspirin 81 Mg Tbec (Aspirin) .... Take One Tablet By Mouth Daily 2)  Crestor 5 Mg Tabs (Rosuvastatin Calcium) .... Take One Tablet By Mouth Daily. 3)  Metoprolol Succinate 100 Mg Xr24h-Tab (Metoprolol Succinate) .Marland Kitchen.. 1 Tab Once Daily 4)  Omeprazole 20 Mg Cpdr (Omeprazole) .... Take One Tablet By Mouth Once Daily. 5)  Diovan 160 Mg Tabs (Valsartan) .... 2 Tab Qam As Per Pt 6)  Tylenol Arthritis Pain 650 Mg Cr-Tabs (Acetaminophen) .Marland Kitchen.. 1 Tab Once Daily 7)  Spironolactone 25 Mg Tabs (Spironolactone) .... Take 1 Tablet Daily For Blood Pressure 8)  Amlodipine Besylate 5 Mg Tabs (Amlodipine Besylate) .... Take One Tablet By Mouth Daily  Allergies (verified): No Known Drug Allergies  Past History:  Past Medical History: Last updated:  03/03/2010 Hypertension Hyperlipidemia CAD s/p CABG 2008       --EF 50-55%       --Myoview 9/09 EF 53%. ? trivial inferobasilar ischemia vs gut artifact Schatzki Ring Hiatus Hernia Diverticulosis Hemorrhoids Anemia Stroke "Light" GI bleed 02/23/10 - diverticulosis suspected Osteoarthritis  Review of Systems       He has some headaches when his BP is high.  Otherwise, as per  the HPI.  All other systems reviewed and negative.   Vital Signs:  Patient profile:   77 year old male Height:      69 inches Weight:      177.50 pounds BMI:     26.31 Pulse rate:   78 / minute Pulse rhythm:   regular BP sitting:   146 / 72  (left arm) Cuff size:   regular  Vitals Entered By: Danielle Rankin, CMA (August 21, 2010 9:35 AM)  Physical Exam  General:  Well nourished, well developed, in no acute distress HEENT: normal Neck: no JVD Cardiac:  normal S1, S2; RRR; no murmur Lungs:  clear to auscultation bilaterally, no wheezing, rhonchi or rales Abd: soft, nontender, no hepatomegaly Ext: no clubbing, cyanosis or edema Vascular: no carotid  bruits Skin: warm and dry Neuro:  CNs 2-12 intact, no focal abnormalities noted    Impression & Recommendations:  Problem # 1:  HYPERTENSION, BENIGN (ICD-401.1) Looks much better.  His cough did not improve with changing to an  ARB.  Therefore, he will switch back to enalapril 20 mg b.i.d.  He will need a bmet 2 weeks after changing.  We will check a bmet today as well.   He just started amlodipine.  He will continue to check his pressures at home.  If his pressure remains elevated we can increase his amlodipine to 10 mg a day.  He has renal Dopplers scheduled later this month and followup with Dr. Gala Romney.  He can keep that appointment.  Problem # 2:  COUGH (ICD-786.2) Not related to ACE inhibitor.  He will need to followup with his primary care physician.  Other Orders: TLB-BMP (Basic Metabolic Panel-BMET) (80048-METABOL)  Patient  Instructions: 1)  Finish your current bottle of Diovan. 2)  Then go back to Enalapril 20 mg two times a day. 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 4)  Your physician recommends that you return for lab work in: TODAY FOR A BMET 401.1 5)  Your physician recommends that you schedule a follow-up appointment in: KEEP YOUR APPOINTMENT THAT IS ALREADY SCHEDULED WITH DR. Gala Romney IN JANUARY 2012.

## 2010-09-24 NOTE — Assessment & Plan Note (Signed)
Summary: High B/P /nm  Medications Added DIOVAN 160 MG TABS (VALSARTAN) Take 1 tablet by mouth once a day for blood pressure SPIRONOLACTONE 25 MG TABS (SPIRONOLACTONE) Take 1 tablet daily for blood pressure        Referring Cray Monnin:  Arvilla Meres, MD Primary Sarita Hakanson:  Tommas Olp, MD   History of Present Illness: Primary Cardiologist:  Dr. Arvilla Meres  Blake Burgess is a 77 year old male with history of hypertension, hyperlipidemia, and previous stroke.  He also has a history of coronary artery disease and underwent three-vessel bypass surgery in October 2008 and ejection fraction has been in the 50-55%.  He had his maxzide discontinued this past Spring after developing vertigo and having low BPs.  He saw Dr. Gala Romney Dec. 8 and was started back on Spironolactone 12.5 mg.  He has had continued high BPs and went to the ED at [facility] last night.  He is added on to my schedule.  He was getting out of the shower yesterday and his wife thought that his face was swollen.  She took his blood pressure and it was extremely high  (209/118) and his heart rate was 44.  He felt lightheaded with this.  He denies palpitations.  He denies chest discomfort.  He denies shortness of breath.  He denies orthopnea or PND.  He denies significant pedal edema.  He was concerned and went to the emergency room at [facility].  It is not clear what medicine he was given to bring his pressure down.  He has a discharge note that indicates he may have  been given extra metoprolol.  It also states that he had PVCs.  He denies any other recent symptoms of lightheadedness or near syncope.  His primary care physician recently suggested that he increase his spironolactone to a whole tablet a day.  He has not yet done this.   Current Medications (verified): 1)  Aspirin 81 Mg Tbec (Aspirin) .... Take One Tablet By Mouth Daily 2)  Crestor 5 Mg Tabs (Rosuvastatin Calcium) .... Take One Tablet By Mouth  Daily. 3)  Metoprolol Succinate 100 Mg Xr24h-Tab (Metoprolol Succinate) .Marland Kitchen.. 1 Tab Once Daily 4)  Omeprazole 20 Mg Cpdr (Omeprazole) .... Take One Tablet By Mouth Once Daily. 5)  Enalapril Maleate 20 Mg Tabs (Enalapril Maleate) .Marland Kitchen.. 1 Tab Two Times A Day 6)  Tylenol Arthritis Pain 650 Mg Cr-Tabs (Acetaminophen) .Marland Kitchen.. 1 Tab Once Daily 7)  Spironolactone 25 Mg Tabs (Spironolactone) .... Take 1/2 Tablet Daily  Allergies: No Known Drug Allergies  Past History:  Past Medical History: Last updated: 03/03/2010 Hypertension Hyperlipidemia CAD s/p CABG 2008       --EF 50-55%       --Myoview 9/09 EF 53%. ? trivial inferobasilar ischemia vs gut artifact Schatzki Ring Hiatus Hernia Diverticulosis Hemorrhoids Anemia Stroke "Light" GI bleed 02/23/10 - diverticulosis suspected Osteoarthritis  Social History: Reviewed history from 03/03/2010 and no changes required. Married one son and one daughter Tobacco Use - No.  Alcohol Use - no Regular Exercise - no Drug Use - no Daily Caffeine Use 24 oz pepsi and 1 cup coffee  Review of Systems       He reports a cough that has been present for a couple of months.  It is not getting better or worse.  He denies fevers or chills.  He states he has a scant, greenish sputum.  He denies hemoptysis.  He feels that this all began when he started on enalapril.  Otherwise, as  per  the HPI.  All other systems reviewed and negative.   Vital Signs:  Patient profile:   77 year old male Height:      69 inches Weight:      179 pounds BMI:     26.53 Pulse rate:   62 / minute Resp:     16 per minute BP sitting:   160 / 70  (left arm)  Vitals Entered By: Blake Burgess (August 07, 2010 12:25 PM)  Physical Exam  General:  Well nourished, well developed, in no acute distress HEENT: normal Neck: no JVD Cardiac:  normal S1, S2; RRR; no murmur Lungs:  clear to auscultation bilaterally, no wheezing, rhonchi or rales Abd: soft, nontender, no  hepatomegaly Ext: no clubbing, cyanosis or edema Vascular: no carotid  bruits Skin: warm and dry Neuro:  CNs 2-12 intact, no focal abnormalities noted    EKG  Procedure date:  08/07/2010  Findings:      Normal Sinus Rhythm Heart rate 64 No ischemic changes  Impression & Recommendations:  Problem # 1:  HYPERTENSION, BENIGN (ICD-401.1)  He continues to have problems controlling his blood pressure.  His episode last night is unclear.  He has a normal heart rate on his EKG today.  Question whether or not he may have had a vagal episode after getting out of the shower.  I agree with increasing his spironolactone to 25 mg a day.  I would not adjust his Toprol.  He is having a cough and I have asked him to stop his enalapril and start on Diovan 160 mg a day.  It is not entirely clear to me that his cough is related to the ACE inhibitor.  He does have some sputum production which would go against an ACE inhibitor induced cough.  Therefore, if his cough does not improve with the change in his medication he will need further workup of his cough with his primary care Blake Burgess.  He will get a followup basic metabolic panel with his primary care Blake Burgess next week with results faxed to [place].  I will see him back in 2 weeks to followup on his blood pressure.  Of note, his renal arterial Dopplers are pending.  Orders: EKG w/ Interpretation (93000)  Problem # 2:  COUGH (ICD-786.2) As above.  Patient Instructions: 1)  Your physician has recommended you make the following change in your medication:  2)  Stop the Enalapril. 3)  Start Diovan for blood pressure.  This is in place of the Enalapril 4)  Increase Spironolactone to 25 mg 1 tab by mouth once daily. 5)  Your physician recommends that you return for lab work in: 1 week (BMET).  You can get drawn at your PCP's office and have them fax to [place].  It is very important you get this done. 6)  Your physician recommends that you schedule a follow-up  appointment in: 2 weeks with Blake Newcomer, Blake Burgess Prescriptions: SPIRONOLACTONE 25 MG TABS (SPIRONOLACTONE) Take 1 tablet daily for blood pressure  #30 x 3   Entered and Authorized by:   Blake Newcomer Blake Burgess   Signed by:   Blake Newcomer Blake Burgess on 08/07/2010   Method used:   Electronically to        [facility]* (retail)       [address]       [place], [place]  [address]       Ph: [phone]       Fax: [phone]  RxID:   [id number] DIOVAN 160 MG TABS (VALSARTAN) Take 1 tablet by mouth once a day for blood pressure  #30 x 3   Entered and Authorized by:   Blake Newcomer Blake Burgess   Signed by:   Blake Newcomer Blake Burgess on 08/07/2010   Method used:   Electronically to        [facility]* (retail)       [address]       [place], [place]  [address]       Ph: [phone]       Fax: [phone]   RxID:   [id number]   Appended Document: High B/P /nm LM FOR DR Janene Madeira OFFICE TO CALL BACK WITH FAX NUMBER PHONE NUMBER [phone] . OFFICE CLOSED UNTIL  SAT AM . NEED OT FAX OFFICE NOTE PER SCOTT WEAVER PA  Appended Document: High B/P /nm fax number [phone] Claris Gladden, RN, BSN

## 2010-09-24 NOTE — Progress Notes (Signed)
Summary: heart rate low/high blood pressure   Phone Note Call from Patient Call back at Home Phone [phone]   Caller: Spouse/treva Reason for Call: Talk to Nurse Summary of Call: pt heart rate drop to 41 and pt felt funny. pt went to er last night. blood pressure was high as well. pt is home now. Initial call taken by: Roe Coombs,  August 07, 2010 9:19 AM  Follow-up for Phone Call        Spoke with pt's wife regarding pt's B/P. Wife states pt's B/P was 209/118  pulse 44 yesterday. He was feeling funny, so he went to   the ER.  Pt was given an extra Metoprolol 50 mg in the ER. B/P went down. This morning B/P was 170/90 prior taken Metoprolol 100mg . Now B/P is 172/131. Pt. has an appointment with Dr. Gala Romney on Jan.20, 2012. Pt. wants to be seen sooner. An appointemnt was made for pt. with Tereso Newcomer PA. Pt. aware. Follow-up by: Ollen Gross, RN, BSN,  August 07, 2010 11:00 AM

## 2010-09-30 NOTE — Assessment & Plan Note (Signed)
Summary: f7m/dfg  Medications Added CRESTOR 5 MG TABS (ROSUVASTATIN CALCIUM) Take one tablet by mouth daily. (OUT) SIMVASTATIN 20 MG TABS (SIMVASTATIN) Take one tablet by mouth daily at bedtime ENALAPRIL MALEATE 20 MG TABS (ENALAPRIL MALEATE) Take one tablet by mouth twice a day AMLODIPINE BESYLATE 10 MG TABS (AMLODIPINE BESYLATE) Take one tablet by mouth daily at bedtime      Allergies Added: NKDA  Visit Type:  Follow-up Referring Provider:  Arvilla Meres, MD Primary Provider:  Tommas Olp  CC:  no complaints.  History of Present Illness: Primary Cardiologist:  Dr. Arvilla Meres  Mr. Bontempo is a 77 year old male with history of hypertension, hyperlipidemia, and previous stroke.  He also has a history of coronary artery disease and underwent three-vessel bypass surgery in October 2008 and ejection fraction has been in the 50-55%.  We have been following him closely recently due to severe HTN.  Tereso Newcomer in our office increased his spironolactone to 25 mg once daily and changed his ACE to Diovan due to cough.  He called back with continued elevated BPs and had norvasc added.  He returns for follow up.  Since he was last seen, his Diovan was stopped and switched back to enalapril as cough didn't change with switch to ARB.  He returns for routine f/u.  Checking SBPs at home range 150-173. Feels very good. No low BPs. He denies chest pain, shortness of breath, syncope.  He denies lower extremity edema. Having trouble affording Crestor.   Renal u/s today: Left renal normal R 0-59% (mild) stenosis)    Current Medications (verified): 1)  Aspirin 81 Mg Tbec (Aspirin) .... Take One Tablet By Mouth Daily 2)  Crestor 5 Mg Tabs (Rosuvastatin Calcium) .... Take One Tablet By Mouth Daily. (Out) 3)  Metoprolol Succinate 100 Mg Xr24h-Tab (Metoprolol Succinate) .Marland Kitchen.. 1 Tab Once Daily 4)  Omeprazole 20 Mg Cpdr (Omeprazole) .... Take One Tablet By Mouth Once Daily. 5)  Enalapril Maleate 20  Mg Tabs (Enalapril Maleate) .... Take One Tablet By Mouth Twice A Day 6)  Tylenol Arthritis Pain 650 Mg Cr-Tabs (Acetaminophen) .Marland Kitchen.. 1 Tab Once Daily 7)  Spironolactone 25 Mg Tabs (Spironolactone) .... Take 1 Tablet Daily For Blood Pressure 8)  Amlodipine Besylate 5 Mg Tabs (Amlodipine Besylate) .... Take One Tablet By Mouth Daily  Allergies (verified): No Known Drug Allergies  Past History:  Past Medical History: Last updated: 03/03/2010 Hypertension Hyperlipidemia CAD s/p CABG 2008       --EF 50-55%       --Myoview 9/09 EF 53%. ? trivial inferobasilar ischemia vs gut artifact Schatzki Ring Hiatus Hernia Diverticulosis Hemorrhoids Anemia Stroke "Light" GI bleed 02/23/10 - diverticulosis suspected Osteoarthritis  Review of Systems       As per HPI and past medical history; otherwise all systems negative.   Vital Signs:  Patient profile:   77 year old male Height:      69 inches Weight:      174 pounds BMI:     25.79 Pulse rate:   75 / minute BP sitting:   135 / 64  (left arm) Cuff size:   regular  Vitals Entered By: Hardin Negus, RMA (September 11, 2010 11:27 AM)  Physical Exam  General:  Well nourished, well developed, in no acute distress HEENT: normal Neck: no JVD Cardiac:  normal S1, S2; RRR; no murmur Lungs:  clear to auscultation bilaterally, no wheezing, rhonchi or rales Abd: soft, nontender, no hepatomegaly Ext: no clubbing, cyanosis or edema Vascular:  no carotid  bruits Skin: warm and dry Neuro:  CNs 2-12 intact, no focal abnormalities noted    Impression & Recommendations:  Problem # 1:  HYPERTENSION, BENIGN (ICD-401.1) Improved but still a bit up particularly in am. Will increase amlodipine ato 10 mg and have him take it at night.  Problem # 2:  CAD, NATIVE VESSEL (ICD-414.01) Stable. No evidence of ischemia. Continue current regimen.  Problem # 3:  HYPERLIPIDEMIA (ICD-272.4)  Will change Crestor to Zocor 20. Check lipids/liver today. Goal  LDL < 70.   Orders: TLB-BMP (Basic Metabolic Panel-BMET) (80048-METABOL) TLB-CBC Platelet - w/Differential (85025-CBCD) TLB-Hepatic/Liver Function Pnl (80076-HEPATIC) TLB-Lipid Panel (80061-LIPID)  Patient Instructions: 1)  Increase Amlodipine to 10mg  at bedtime  2)  Stop Crestor 3)  Start Simvastatin 20mg  at bedtime  4)  Labs today 5)  Follow up in 6 weeks Prescriptions: SIMVASTATIN 20 MG TABS (SIMVASTATIN) Take one tablet by mouth daily at bedtime  #30 x 6   Entered by:   Meredith Staggers, RN   Authorized by:   Dolores Patty, MD, [facility]   Signed by:   Meredith Staggers, RN on 09/11/2010   Method used:   Electronically to        [facility]* (retail)       [address]       [place], [place]  [address]       Ph: [phone]       Fax: [phone]   RxID:   [id number] AMLODIPINE BESYLATE 10 MG TABS (AMLODIPINE BESYLATE) Take one tablet by mouth daily at bedtime  #30 x 6   Entered by:   Meredith Staggers, RN   Authorized by:   Dolores Patty, MD, [facility]   Signed by:   Meredith Staggers, RN on 09/11/2010   Method used:   Electronically to        [facility]* (retail)       [address]       [place], [place]  [address]       Ph: [phone]       Fax: [phone]   RxID:   [id number]

## 2010-10-19 ENCOUNTER — Encounter: Payer: Self-pay | Admitting: Internal Medicine

## 2010-10-19 ENCOUNTER — Ambulatory Visit (INDEPENDENT_AMBULATORY_CARE_PROVIDER_SITE_OTHER): Payer: Medicare Other | Admitting: Internal Medicine

## 2010-10-19 DIAGNOSIS — I10 Essential (primary) hypertension: Secondary | ICD-10-CM

## 2010-10-19 DIAGNOSIS — R5383 Other fatigue: Secondary | ICD-10-CM

## 2010-10-19 DIAGNOSIS — I2510 Atherosclerotic heart disease of native coronary artery without angina pectoris: Secondary | ICD-10-CM

## 2010-10-19 DIAGNOSIS — R5381 Other malaise: Secondary | ICD-10-CM

## 2010-10-29 ENCOUNTER — Encounter (INDEPENDENT_AMBULATORY_CARE_PROVIDER_SITE_OTHER): Payer: Self-pay | Admitting: *Deleted

## 2010-10-30 ENCOUNTER — Telehealth: Payer: Self-pay | Admitting: Internal Medicine

## 2010-11-03 NOTE — Progress Notes (Addendum)
Summary: pt cant take simvastatin   Phone Note Call from Patient Call back at Home Phone [phone]   Caller: Spouse Reason for Call: Talk to Nurse, Talk to Doctor Summary of Call: pt cant take simvastatin it makes his hips hurt and his b/p has went up  Initial call taken by: Omer Jack,  October 30, 2010 9:21 AM  Follow-up for Phone Call        10/30/10--0930am--pt calling stating having much hip discomfort since starting simvastatin--does not want to take anymore--advised i would pass information along to dr bensimhon for review--nt Follow-up by: Ledon Snare, RN,  October 30, 2010 9:43 AM     Appended Document: pt cant take simvastatin switch back to crestor 5 daily  Appended Document: pt cant take simvastatin called pt, spoke w/family member she states pt has a call into pcp to see if there is something else to try less expensive, will c/b if needs [place]

## 2010-11-03 NOTE — Letter (Signed)
Summary: Appointment - Reschedule  [facility], Main Office  [address]   [place], [place] [address]   Phone: [phone]  Fax: [phone]     October 29, 2010 MRN: [id number]   Blake Burgess [address] [place], [place]  [address]   Dear Mr. Eagen,   Due to a change in our office schedule, your appointment on March 29,2012 at 10:45 must be changed.  It is very important that we reach you to reschedule this appointment. We look forward to participating in your health care needs. Please contact us at the number listed above at your earliest convenience to reschedule this appointment.     Sincerely, Judie Grieve [facility] Scheduling Team

## 2010-11-03 NOTE — Assessment & Plan Note (Signed)
Summary: 6 wks./DM/car      Allergies Added: ! NEOSPORIN  Visit Type:  Follow-up Referring Provider:  Arvilla Meres, MD Primary Provider:  Tommas Olp  CC:  Fatigued.  History of Present Illness: Primary Cardiologist:  Dr. Arvilla Meres  Blake Burgess is a 77 year old male with history of hypertension, hyperlipidemia, and previous stroke.  He also has a history of coronary artery disease and underwent three-vessel bypass surgery in October 2008 and ejection fraction has been in the 50-55%.  We have been following him closely recently due to severe HTN. Has had multiple recent changes in his BP medicines.  Renal u/s 1/12: 1-59% mild R RAS. Left ok.  At his last visit we increased his amlodipine and changed it to night dosing. Says he feels "fair". Really no change from previous. Feels like he just doesn't have the enegy he wants.  However, once he gets going can do anything he wants to do. Says he can shovel for 2 hours without a problem but if he is piddling in yard he gets fatigued.  Occasional brief CP but not related to exertion. No CHF.   Following BP closely. Initially after increasing amlodipine BP was low but now better. Now ranges 107/73 to 162/83. (Average SBP probably 130-140).     Current Medications (verified): 1)  Aspirin 81 Mg Tbec (Aspirin) .... Take One Tablet By Mouth Daily 2)  Simvastatin 20 Mg Tabs (Simvastatin) .... Take One Tablet By Mouth Daily At Bedtime 3)  Metoprolol Succinate 100 Mg Xr24h-Tab (Metoprolol Succinate) .Marland Kitchen.. 1 Tab Once Daily 4)  Omeprazole 20 Mg Cpdr (Omeprazole) .... Take One Tablet By Mouth Once Daily. 5)  Enalapril Maleate 20 Mg Tabs (Enalapril Maleate) .... Take One Tablet By Mouth Twice A Day 6)  Tylenol Arthritis Pain 650 Mg Cr-Tabs (Acetaminophen) .Marland Kitchen.. 1 Tab Once Daily 7)  Spironolactone 25 Mg Tabs (Spironolactone) .... Take 1 Tablet Daily For Blood Pressure  Allergies (verified): 1)  ! Neosporin  Past History:  Past Medical  History: Last updated: 03/03/2010 Hypertension Hyperlipidemia CAD s/p CABG 2008       --EF 50-55%       --Myoview 9/09 EF 53%. ? trivial inferobasilar ischemia vs gut artifact Schatzki Ring Hiatus Hernia Diverticulosis Hemorrhoids Anemia Stroke "Light" GI bleed 02/23/10 - diverticulosis suspected Osteoarthritis  Review of Systems       As per HPI and past medical history; otherwise all systems negative.   Vital Signs:  Patient profile:   77 year old male Height:      69 inches Weight:      181.25 pounds BMI:     26.86 Pulse rate:   57 / minute BP sitting:   138 / 58  (left arm) Cuff size:   regular  Vitals Entered By: Caralee Ates CMA (October 19, 2010 12:52 PM)  Physical Exam  General:  Well nourished, well developed, in no acute distress HEENT: normal Neck: no JVD Cardiac:  normal S1, S2; RRR; no murmur Lungs:  clear to auscultation bilaterally, no wheezing, rhonchi or rales Abd: soft, nontender, no hepatomegaly Ext: no clubbing, cyanosis or edema Vascular: no carotid  bruits Skin: warm and dry Neuro:  CNs 2-12 intact, no focal abnormalities noted    Impression & Recommendations:  Problem # 1:  HYPERTENSION, BENIGN (ICD-401.1) BP much improved. Will continue to montior closely and make adjustments as needed.   Problem # 2:  CAD, NATIVE VESSEL (ICD-414.01) Given fatigue and occasional CP will proceed with ETT to assess  exercise capacity, HR response to exercise and for presence of ischemia.   Other Orders: Treadmill (Treadmill)  Patient Instructions: 1)  Your physician has requested that you have an exercise tolerance test.  For further information please visit [email or web address]  Please also follow instruction sheet, as given. 2)  Your physician recommends that you schedule a follow-up appointment in: 4 months.

## 2010-11-07 ENCOUNTER — Encounter: Payer: Self-pay | Admitting: Internal Medicine

## 2010-11-19 ENCOUNTER — Encounter: Payer: Medicare Other | Admitting: Internal Medicine

## 2010-11-24 ENCOUNTER — Ambulatory Visit (INDEPENDENT_AMBULATORY_CARE_PROVIDER_SITE_OTHER): Payer: Medicare Other | Admitting: Physician Assistant

## 2010-11-24 ENCOUNTER — Encounter: Payer: Self-pay | Admitting: *Deleted

## 2010-11-24 DIAGNOSIS — R9431 Abnormal electrocardiogram [ECG] [EKG]: Secondary | ICD-10-CM

## 2010-11-24 DIAGNOSIS — R9439 Abnormal result of other cardiovascular function study: Secondary | ICD-10-CM

## 2010-11-24 DIAGNOSIS — I2510 Atherosclerotic heart disease of native coronary artery without angina pectoris: Secondary | ICD-10-CM

## 2010-11-24 NOTE — Progress Notes (Signed)
Exercise Treadmill Test  Pre-Exercise Testing Evaluation Rhythm: normal sinus  Rate: 66   PR:  .17 QRS:  .09  QT:  .43 QTc: .45     Test  Exercise Tolerance Test Ordering MD: Arvilla Meres, MD  Interpreting MD:  Tereso Newcomer PA-C  Unique Test No: 1  Treadmill:  1  Indication for ETT: Fatigue  Contraindication to ETT: No   Stress Modality: exercise - treadmill  Cardiac Imaging Performed: non   Protocol: standard Bruce - maximal  Max BP: 187/94  Max MPHR (bpm):  144 85% MPR (bpm): 122  MPHR obtained (bpm):  157 % MPHR obtained:  112  Reached 85% MPHR (min:sec):  1:00 Total Exercise Time (min-sec):  2:00  Workload in METS:  4.7 Borg Scale: 15  Reason ETT Terminated:  fatigue    ST Segment Analysis At Rest: normal ST segments - no evidence of significant ST depression With Exercise: significant ischemic ST depression  Other Information Arrhythmia:  Yes Angina during ETT:  absent (0) Quality of ETT:  diagnostic  ETT Interpretation:  abnormal - evidence of ST depression consistent with ischemia  Comments: Poor exercise tolerance. Test stopped due to hip pain. Normal BP response. No chest pain.  Patient did report DOE. EKG with 2 mm ST depression in leads V4-6 at max. Exercise. Increased ectopy with frequent PVCs, bigeminy, couplet x 1, triplet x 1, burst of SVT (4-5 beats)   Recommendations: Discussed with Dr. Excell Seltzer. With known CAD, will arrange left heart cath.

## 2010-11-24 NOTE — Assessment & Plan Note (Signed)
Abnormal exercise treadmill test.  Will arrange cardiac catheterization with Dr. Gala Romney.  The patient will remain on aspirin.  Risks and benefits of cardiac catheterization have been discussed with the patient.  These include bleeding, infection, kidney damage, stroke, heart attack, death.  The patient understands these risks and is willing to proceed.

## 2010-11-25 ENCOUNTER — Encounter: Payer: Self-pay | Admitting: *Deleted

## 2010-11-26 ENCOUNTER — Other Ambulatory Visit (INDEPENDENT_AMBULATORY_CARE_PROVIDER_SITE_OTHER): Payer: Medicare Other | Admitting: *Deleted

## 2010-11-26 ENCOUNTER — Telehealth: Payer: Self-pay | Admitting: *Deleted

## 2010-11-26 DIAGNOSIS — I2510 Atherosclerotic heart disease of native coronary artery without angina pectoris: Secondary | ICD-10-CM

## 2010-11-26 DIAGNOSIS — Z01810 Encounter for preprocedural cardiovascular examination: Secondary | ICD-10-CM

## 2010-11-26 DIAGNOSIS — R9430 Abnormal result of cardiovascular function study, unspecified: Secondary | ICD-10-CM

## 2010-11-26 LAB — BASIC METABOLIC PANEL
BUN: 11 mg/dL (ref 6–23)
CO2: 30 mEq/L (ref 19–32)
Calcium: 8.7 mg/dL (ref 8.4–10.5)
Chloride: 105 mEq/L (ref 96–112)
Creatinine, Ser: 0.8 mg/dL (ref 0.4–1.5)
GFR: 94.26 mL/min (ref >60.00)
Glucose, Bld: 119 mg/dL — ABNORMAL HIGH (ref 70–99)
Potassium: 3.8 mEq/L (ref 3.5–5.1)
Sodium: 143 mEq/L (ref 135–145)

## 2010-11-26 LAB — CBC WITH DIFFERENTIAL/PLATELET
Basophils Absolute: 0 10*3/uL (ref 0.0–0.1)
Basophils Relative: 0.4 % (ref 0.0–3.0)
Eosinophils Absolute: 0.3 10*3/uL (ref 0.0–0.7)
Eosinophils Relative: 4.6 % (ref 0.0–5.0)
HCT: 38.1 % — ABNORMAL LOW (ref 39.0–52.0)
Hemoglobin: 13.1 g/dL (ref 13.0–17.0)
Lymphocytes Relative: 31.1 % (ref 12.0–46.0)
Lymphs Abs: 1.9 10*3/uL (ref 0.7–4.0)
MCHC: 34.5 g/dL (ref 30.0–36.0)
MCV: 99.1 fl (ref 78.0–100.0)
Monocytes Absolute: 0.7 10*3/uL (ref 0.1–1.0)
Monocytes Relative: 11.4 % (ref 3.0–12.0)
Neutro Abs: 3.2 10*3/uL (ref 1.4–7.7)
Neutrophils Relative %: 52.5 % (ref 43.0–77.0)
Platelets: 307 10*3/uL (ref 150.0–400.0)
RBC: 3.84 Mil/uL — ABNORMAL LOW (ref 4.22–5.81)
RDW: 12.8 % (ref 11.5–14.6)
WBC: 6.1 10*3/uL (ref 4.5–10.5)

## 2010-11-26 LAB — PROTIME-INR
INR: 1 ratio (ref 0.8–1.0)
Prothrombin Time: 11 s (ref 10.2–12.4)

## 2010-11-26 NOTE — Telephone Encounter (Signed)
PT AWARE OF CORRECT TIME FOR CATH AND TO BE THERE AT 8:30 AM FOR 9:30 CATH ON  11/27/10 .Marland KitchenMarland KitchenAPOLOGIZED FOR ALL THE CONFUSION.

## 2010-11-27 ENCOUNTER — Inpatient Hospital Stay (HOSPITAL_BASED_OUTPATIENT_CLINIC_OR_DEPARTMENT_OTHER)
Admission: RE | Admit: 2010-11-27 | Discharge: 2010-11-27 | Disposition: A | Discharge status: Home / Self Care | Payer: Medicare Other | Source: Ambulatory Visit | Attending: Internal Medicine | Admitting: Internal Medicine

## 2010-11-27 DIAGNOSIS — R0989 Other specified symptoms and signs involving the circulatory and respiratory systems: Secondary | ICD-10-CM | POA: Insufficient documentation

## 2010-11-27 DIAGNOSIS — R0789 Other chest pain: Secondary | ICD-10-CM | POA: Insufficient documentation

## 2010-11-27 DIAGNOSIS — I2510 Atherosclerotic heart disease of native coronary artery without angina pectoris: Secondary | ICD-10-CM | POA: Insufficient documentation

## 2010-11-27 DIAGNOSIS — I2582 Chronic total occlusion of coronary artery: Secondary | ICD-10-CM | POA: Insufficient documentation

## 2010-11-27 DIAGNOSIS — R0609 Other forms of dyspnea: Secondary | ICD-10-CM | POA: Insufficient documentation

## 2010-11-27 DIAGNOSIS — I10 Essential (primary) hypertension: Secondary | ICD-10-CM | POA: Insufficient documentation

## 2010-11-30 ENCOUNTER — Other Ambulatory Visit: Payer: Medicare Other | Admitting: *Deleted

## 2010-11-30 ENCOUNTER — Telehealth: Payer: Self-pay | Admitting: Internal Medicine

## 2010-11-30 NOTE — Telephone Encounter (Signed)
Pt is aware and will increase med

## 2010-11-30 NOTE — Telephone Encounter (Signed)
Lets try to double prilosec for a few weeks and see if that helps.

## 2010-11-30 NOTE — Telephone Encounter (Signed)
Spoke w/pt he states that he is cont. To have chest pain, he states it comes on and last just a couple of sec. And then is gone, he states it is right under his left breast and doesn't radiate anywhere.  He also c/o of increased SOB in last couple of days, he states he has not taken ntg b/c it doesn't last long enough, reassured pt that cath was ok on Fri, all graphs were patent, he states he understands that but doesn't know why he is cont. To have pain.  Will let Dr Gala Romney know

## 2010-12-04 ENCOUNTER — Inpatient Hospital Stay (HOSPITAL_BASED_OUTPATIENT_CLINIC_OR_DEPARTMENT_OTHER): Admission: RE | Admit: 2010-12-04 | Payer: Medicare Other | Source: Ambulatory Visit | Admitting: Internal Medicine

## 2010-12-13 ENCOUNTER — Encounter: Payer: Self-pay | Admitting: Physician Assistant

## 2010-12-14 ENCOUNTER — Ambulatory Visit (INDEPENDENT_AMBULATORY_CARE_PROVIDER_SITE_OTHER): Payer: Medicare Other | Admitting: Physician Assistant

## 2010-12-14 ENCOUNTER — Encounter: Payer: Self-pay | Admitting: Physician Assistant

## 2010-12-14 VITALS — BP 189/70 | HR 61 | Ht 68.0 in | Wt 177.0 lb

## 2010-12-14 DIAGNOSIS — R0602 Shortness of breath: Secondary | ICD-10-CM

## 2010-12-14 DIAGNOSIS — I209 Angina pectoris, unspecified: Secondary | ICD-10-CM | POA: Insufficient documentation

## 2010-12-14 DIAGNOSIS — I10 Essential (primary) hypertension: Secondary | ICD-10-CM

## 2010-12-14 DIAGNOSIS — I2510 Atherosclerotic heart disease of native coronary artery without angina pectoris: Secondary | ICD-10-CM

## 2010-12-14 DIAGNOSIS — R079 Chest pain, unspecified: Secondary | ICD-10-CM

## 2010-12-14 LAB — BASIC METABOLIC PANEL
BUN: 14 mg/dL (ref 6–23)
CO2: 30 mEq/L (ref 19–32)
Calcium: 8.6 mg/dL (ref 8.4–10.5)
Chloride: 101 mEq/L (ref 96–112)
Creatinine, Ser: 0.9 mg/dL (ref 0.4–1.5)
GFR: 90.51 mL/min (ref >60.00)
Glucose, Bld: 116 mg/dL — ABNORMAL HIGH (ref 70–99)
Potassium: 4.2 mEq/L (ref 3.5–5.1)
Sodium: 141 mEq/L (ref 135–145)

## 2010-12-14 MED ORDER — AMLODIPINE BESYLATE 5 MG PO TABS: ORAL_TABLET | ORAL | Status: DC

## 2010-12-14 NOTE — Assessment & Plan Note (Signed)
As noted, bypass grafts patent.  Continue aspirin.

## 2010-12-14 NOTE — Assessment & Plan Note (Signed)
Uncontrolled.  Repeat blood pressure by me 166/66.  Restart amlodipine 5 mg daily.

## 2010-12-14 NOTE — Patient Instructions (Addendum)
Your physician recommends that you schedule a follow-up appointment in: 4 WEEKS WITH DR. Gala Romney AS PER SCOTT WEAVER, PA-C.  Your physician has recommended you make the following change in your medication: START NORVASC 5 MG ; FOR THE FIRST 2-3 DAYS TAKE ONLY 1/2 (HALF) TABLET THEN INCREASE TO 1 TABLET DAILY.  Your physician recommends that you return for lab work in: BMET, BNP 401.1, 786.50, 786.05

## 2010-12-14 NOTE — Assessment & Plan Note (Signed)
Check a BNP is noted.  Add very low dose Lasix if this is abnormal.

## 2010-12-14 NOTE — Progress Notes (Signed)
History of Present Illness: Primary Cardiologist:  Dr. Arvilla Meres  Blake Burgess is a 77 y.o. male with a history of hypertension, hyperlipidemia, and previous stroke.  He also has a history of coronary artery disease and underwent three-vessel bypass surgery in October 2008 and ejection fraction has been in the 50-55%.  We have been following him closely recently due to severe HTN. Has had multiple recent changes in his BP medicines.  Renal u/s 1/12: 1-59% mild R RAS. Left ok.  I recently saw him for an exercise treadmill test.  This was abnormal and we set him up for cardiac catheterization.  This was done on April 10 and demonstrated 3 of 3 patent bypass grafts and normal LV function.  The patient returns for follow up.  He continues to note occasional chest pain.  This is atypical.  It is left-sided.  It lasts a second or less.  No activity brings it on.  He continues to note dyspnea with exertion.  When he is active, he feels fine.  However, if he tries to go up steps he notes shortness of breath.  He really describes NYHA class II symptoms.  He denies orthopnea or PND.  He denies lower extremity edema.  He denies syncope.  Past Medical History  Diagnosis Date  . Other and unspecified hyperlipidemia   . Hypertension   . Coronary artery disease     a.  s/p CABG;   b. cath 4/12: EF 55%, 3vCAD, patent L-LAD, patent S-RCA, patent S-CFX (done 2/2 false pos. ETT)  . Schatzki's ring   . HH (hiatus hernia)   . Diverticular disease   . Hemorrhoids   . Stroke   . GI bleed   . Osteoarthritis     Current Outpatient Prescriptions  Medication Sig Dispense Refill  . acetaminophen (TYLENOL ARTHRITIS PAIN) 650 MG CR tablet Take 650 mg by mouth daily.        Marland Kitchen aspirin 81 MG tablet Take 81 mg by mouth daily.        Marland Kitchen co-enzyme Q-10 30 MG capsule Take 100 mg by mouth 2 (two) times daily.        . enalapril (VASOTEC) 20 MG tablet Take 20 mg by mouth 2 (two) times daily.        Marland Kitchen LIPITOR 10 MG tablet        . metoprolol (TOPROL-XL) 100 MG 24 hr tablet Take 100 mg by mouth daily.        Marland Kitchen omeprazole (PRILOSEC) 20 MG capsule Take 20 mg by mouth daily.        Marland Kitchen amLODipine (NORVASC) 5 MG tablet TAKE ONLY 1/2 (HALF) TABLET FOR THE FIRST 2-3 DAYS, THEN INCREASE TO 1 TABLET DAILY  30 tablet  11  . DISCONTD: simvastatin (ZOCOR) 20 MG tablet Take 20 mg by mouth at bedtime.        Marland Kitchen DISCONTD: spironolactone (ALDACTONE) 25 MG tablet Take 25 mg by mouth daily.          Allergies  Allergen Reactions  . Triple Antibiotic    History  Substance Use Topics  . Smoking status: Never Smoker   . Smokeless tobacco: Not on file  . Alcohol Use: No    ROS:    See history of present illness.  He denies fevers, chills, cough, wheezing.  He denies melena or hematochezia.  All other systems reviewed and negative.  Vital Signs: BP 189/70  Pulse 61  Ht 5\' 8"  (1.727 m)  Wt 177 lb (80.287 kg)  BMI 26.91 kg/m2  PHYSICAL EXAM: Well nourished, well developed, in no acute distress HEENT: normal Neck: no JVD Cardiac:  normal S1, S2; RRR; no murmur Lungs:  clear to auscultation bilaterally, no wheezing, rhonchi or rales Abd: soft, nontender, no hepatomegaly Ext: no edema; RFA site without hematoma or bruit Skin: warm and dry Neuro:  CNs 2-12 intact, no focal abnormalities noted  EKG:  Sinus rhythm, heart rate 61, normal axis, poor R wave progression, nonspecific ST-T wave changes  ASSESSMENT AND PLAN:

## 2010-12-14 NOTE — Assessment & Plan Note (Addendum)
He continues to have symptoms of atypical chest pain.  As noted, his cardiac catheterization demonstrated patent bypass grafts and normal LV function.  He called the office recently and his omeprazole was increased.  This did not help and he denies any association with meals.  His blood pressure continues to be uncontrolled.  He also has symptoms of shortness of breath.  An echocardiogram done in 2009 in demonstrates evidence of diastolic dysfunction.  He does not particularly look volume overloaded to me on exam today.  I will obtain a basic metabolic panel and BNP.  If his BNP is slightly elevated, we could try a very low dose of Lasix to see if this helps his shortness of breath.  I question whether or not his blood pressure may be exacerbating some of his symptoms as well.  For some reason, he is off of his amlodipine.  I will restart this at 5 mg a day.  When I told him his goal blood pressure is 120/80 he noted that he feels bad when his blood pressure drops this slow.  He can take a half tablet a day at first to see if this helps bring his pressure down more slowly.  He can follow up with Dr. Gala Romney in one month.

## 2010-12-15 ENCOUNTER — Telehealth: Payer: Self-pay | Admitting: *Deleted

## 2010-12-15 DIAGNOSIS — I10 Essential (primary) hypertension: Secondary | ICD-10-CM

## 2010-12-15 LAB — BRAIN NATRIURETIC PEPTIDE: Pro B Natriuretic peptide (BNP): 156 pg/mL — ABNORMAL HIGH (ref 0.0–100.0)

## 2010-12-15 MED ORDER — FUROSEMIDE 20 MG PO TABS: 20.0000 mg | ORAL_TABLET | Freq: Every day | ORAL | Status: DC

## 2010-12-15 MED ORDER — POTASSIUM CHLORIDE 10 MEQ PO TBCR: 10.0000 meq | EXTENDED_RELEASE_TABLET | Freq: Every day | ORAL | Status: DC

## 2010-12-15 NOTE — Telephone Encounter (Signed)
See meds and orders for new rx

## 2010-12-15 NOTE — Telephone Encounter (Signed)
See phone note

## 2010-12-17 ENCOUNTER — Telehealth: Payer: Self-pay | Admitting: Internal Medicine

## 2010-12-17 NOTE — Telephone Encounter (Signed)
Discussed w/Dr Bensimhon have pt stop both meds and see how he does, pt's wife is aware

## 2010-12-17 NOTE — Telephone Encounter (Signed)
pts wife states he started taking furosemide and potassium yesterday and today he has felt horrible, he has double vision and just doesn't feel right, she has not checked his BP, will discuss w/Dr Bensimhon and call her back

## 2010-12-22 NOTE — Cardiovascular Report (Signed)
NAME:  JAELIN, FACKLER                 ACCOUNT NO.:  [id number]  MEDICAL RECORD NO.:  [id number]          PATIENT TYPE:  LOCATION:                                 FACILITY:  PHYSICIAN:  Bevelyn Buckles. Kiah Vanalstine, MD     DATE OF BIRTH:  DATE OF PROCEDURE: DATE OF DISCHARGE:                           CARDIAC CATHETERIZATION   PATIENT IDENTIFICATION:  Blake Burgess is very pleasant 77 year old male with a history of severe hypertension.  He also has history of coronary artery disease status post bypass surgery in 2008 by Dr. Donata Clay.  He presented with some atypical chest pain and dyspnea.  He underwent stress test which was early positive with significant ST depression in the first few minutes of exercise.  He is thus referred to cardiac catheterization.  PROCEDURES PERFORMED: 1. Selective coronary angiography. 2. Saphenous vein graft angiography x2. 3. LIMA angiography. 4. Left heart cath. 5. Left ventriculogram.  DESCRIPTION OF PROCEDURE:  Risks and indication were explained.  Consent was signed and placed on the chart. A 4-French arterial sheath was placed in the right femoral artery using modified Seldinger technique. Standard catheters including JL-4, 3-D RCA left coronary bypass catheter and angled pigtail were used.  All catheter exchanges made over wire. There were no apparent complications.  Central aortic pressure 170/58 with a mean of 103.  LV pressure 175/10 with an EP of 23.  There was no aortic stenosis.  CORONARY ANGIOGRAPHY: 1. 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 2. LAD had an 80-90% tubular lesion proximally with some mild to     moderate stenosis in the midsection.  The distal LAD was filled by     competitive flow from the LIMA.  Left circumflex gave off a tiny OM1, moderate sized OM2, and then the distal circumflex and OM3 filled from competitive flow from the graft. There was 80% lesion in the mid AV groove circ followed by tubular  50% lesion and then in the distal AV groove circ appeared to be 80-90% lesion just prior to the takeoff of the OM3 though this was not visualized.  Right coronary artery was totally occluded proximally.  LIMA to the LAD tightened about the mid LAD was widely patent with good flow down the distal LAD.  There was no high-grade stenosis distally.  Saphenous vein graft to the OM3 was widely patent with good flow into the distal circumflex and OM3.  Saphenous vein graft to the distal right coronary artery was widely patent.  It back-filled only to the mid right coronary artery and also filled distally well to reveal a PDA and several posterolaterals.  There was no high-grade disease distally.  Left ventriculogram done in the RAO position showed an EF of 50-55%.  No regional wall motion abnormalities.  ASSESSMENT: 1. Three-vessel coronary artery disease. 2. Intact revascularization with 3 out of 3 bypass grafts patent. 3. Left ventricular ejection fraction of 50-55% with moderately     elevated filling pressures.  PLAN/DISCUSSION:  Based on his angiography, I suspect his stress test is a false positive.  We will continue with aggressive medical therapy.  Bevelyn Buckles. Kemiah Booz, MD     DRB/MEDQ  D:  11/27/2010  T:  11/28/2010  Job:  [id number]  cc:   Tommas Olp  Electronically Signed by Arvilla Meres MD on 12/22/2010 07:14:10 PM

## 2010-12-23 ENCOUNTER — Other Ambulatory Visit: Payer: Medicare Other | Admitting: *Deleted

## 2011-01-05 NOTE — Op Note (Signed)
NAMENORVILLE, DANI NO.:  [id number]   MEDICAL RECORD NO.:  [id number]          PATIENT TYPE:  INP   LOCATION:  2307                         FACILITY:  [facility]   PHYSICIAN:  Kerin Perna, M.D.  DATE OF BIRTH:  [birth year]   DATE OF PROCEDURE:  06/21/2007  DATE OF DISCHARGE:                               OPERATIVE REPORT   OPERATION:  1. Coronary artery bypass grafting x3 (left internal mammary artery to      LAD, saphenous vein graft to obtuse marginal, saphenous vein graft      to right coronary artery).  2. Endoscopic vein harvest of the right leg greater saphenous vein.   SURGEON:  Kerin Perna, M.D.   ASSISTANTS:  1. Charlett Lango, MD.  2. Rick _Arozco_________ , SA.   PREOPERATIVE DIAGNOSIS:  Severe three-vessel coronary artery disease  with unstable angina and reduced left ventricular function.   POSTOPERATIVE DIAGNOSIS:  Severe three-vessel coronary artery disease  with unstable angina and reduced left ventricular function.   ANESTHESIA:  General.   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  Cardiac catheterization by Dr. Gala Romney demonstrated severe three-  vessel coronary disease including total occlusion right coronary, 99%  stenosis of the LAD, 80% stenosis of the circumflex.  EF was 40%.  He  was on chronic Plavix after a lacunar stroke four years previously.  He  is felt to be candidate for surgical revascularization after a Plavix  washout.   Prior to surgery, I reviewed the results of the cardiac cath with the  patient and his family.  I discussed the indication and expected  benefits of coronary artery bypass surgery for treatment of the coronary  artery disease.  I also reviewed the major aspects of the planned  procedure including the use of general anesthesia and cardiopulmonary  bypass, the location of the surgical incisions and  the choice of  conduit, and the expected postoperative hospital recovery.  I reviewed  the specific risks of the operation to the patient including risks of  MI, CVA, bleeding, blood transfusion requirement, infection, and death.  He understood that due to his long term Plavix therapy, he was at  increased risk for requiring blood transfusions.  After reviewing this  information, he demonstrated his understanding and agreed to proceed  with the operation as planned under what I felt was an informed consent.   OPERATIVE FINDINGS:  The vein was harvested endoscopically and was of  good quality.  The mammary artery was a good vessel with excellent flow.  The patient had a hemoglobin of less than 8 grams on bypass and received  2 units of packed cells during the operation.   PROCEDURE:  The patient was brought to the operating room and placed  supine on the operating table where general anesthesia was induced.  The  chest, abdomen and legs were prepped with Betadine and draped as a  sterile field.  A sternal incision was made as the saphenous vein was  harvested  endoscopically from the right leg.  The left internal mammary  artery was harvested as a pedicle graft from its origin at the  subclavian vessels.  Heparin was administered and the ACT was documented  as being therapeutic.  After the vein was inspected and found to be  adequate, pursestrings were placed in the ascending aorta and right  atrium and the patient was cannulated and placed on bypass.  The  coronaries were identified for grafting and the mammary artery and vein  grafts were prepared for the distal anastomoses.  Cardioplegia catheters  were placed for both antegrade and retrograde cold blood cardioplegia.  The patient was cooled to 32 degrees and the aortic crossclamp was  applied.  There was 800 mL of cold blood cardioplegia delivered in split  doses between the antegrade aortic and retrograde coronary sinus  catheters.   There was a good cardioplegic arrest with septal temperature  dropping less than 12 degrees.  Cardioplegia was delivered every 20  minutes while the crossclamp was in place.   The distal coronary anastomoses were then performed.  First distal  anastomosis was to the distal right.  This was totally occluded  proximally.  It was a 1.5 mm vessel and a good target.  A reverse  saphenous vein was sewn end-to-side with running 7-0 Prolene with good  flow through graft.  The second distal anastomosis was of the obtuse  marginal.  This was a large 1.7 mm vessel with proximal 80% stenosis.  A  reverse saphenous vein was sewn end-to-side with running 7-0 Prolene  with good flow through the graft.  Cardioplegia was redosed.  The third  distal anastomosis was to the distal LAD.  This had a proximal 99%  stenosis.  The left IMA pedicle was brought through an opening created  in the left lateral pericardium and was brought down onto the LAD and  sewn end-to-side with running 8-0 Prolene.  There was excellent flow  through the anastomosis after briefly releasing the pedicle bulldog on  the mammary artery.  The bulldog was reapplied and the pedicle was  secured to the epicardium.  Cardioplegia was redosed.   While the crossclamp was still in place, two proximal vein anastomoses  were performed on the ascending aorta using a 4.0 mm punch and running 7-  0 Prolene.  Prior to tying down the final proximal anastomosis, air was  vented from the coronaries with a dose of retrograde warm blood  cardioplegia.  The final proximal anastomosis was tied and the  crossclamp was removed.   The heart was cardioverted back to a regular rhythm.  The cardioplegia  catheters were removed.  Air was aspirated from the vein grafts with a  27 gauge needle.  The vein grafts were opened and each had good flow.  Hemostasis was documented at the proximal and distal anastomoses.  The  patient was then rewarmed to 37 degrees.   Temporary pacing wires were  applied.  The lungs were re-expanded and the ventilator was resumed.  When the patient was adequately rewarmed, he was weaned from bypass  without difficulty without inotropes.  Blood pressure and cardiac output  were stable.  Protamine was administered without adverse reaction.  The  cannula was removed.  The mediastinum was irrigated with warm antibiotic  irrigation.  The patient had persistent diffuse coagulopathy consistent  with Plavix and he was given platelets and FFP transfusion.  The leg  incision was closed in a standard fashion.  The  superior pericardial fat  was closed.  Two mediastinal and a left pleural chest tube were placed  and brought out through separate incisions.  The sternum was closed with  interrupted steel wire.  Pectoralis fascia was closed with a running #1  Vicryl.  Subcutaneous and skin layers were closed with a running Vicryl  and sterile dressings were applied.  Total bypass time is 84 minutes,  crossclamp time of 52 minutes.      Kerin Perna, M.D.  Electronically Signed    PV/MEDQ  D:  06/21/2007  T:  06/22/2007  Job:  [id number]   cc:   Luis Abed, MD, [facility]

## 2011-01-05 NOTE — Discharge Summary (Signed)
Blake Burgess, Blake Burgess NO.:  [id number]   MEDICAL RECORD NO.:  [id number]          PATIENT TYPE:  INP   LOCATION:  2033                         FACILITY:  [facility]   PHYSICIAN:  Kerin Perna, M.D.  DATE OF BIRTH:  Nov 01, 1931   DATE OF ADMISSION:  06/16/2007  DATE OF DISCHARGE:                               DISCHARGE SUMMARY   REASON FOR CONSULTATION:  Severe 3-vessel coronary artery disease with  unstable angina.   CHIEF COMPLAINT:  Chest pain.   HISTORY OF PRESENT ILLNESS:  I was asked to evaluate this 77 year old  white male from [facility] for consideration of surgical coronary  revascularization for recently diagnosed severe 3-vessel coronary artery  disease.  The patient had no heart history until recently when he  developed exertional chest tightness resolved with rest, which  progressed to nocturnal and which woke him from sleep at 5 a.m. on the  date of admission, October 24th.  He subsequently developed dyspnea and  increased severity of the pain which not resolve with rest, and he was  brought to the emergency department, where he had nonspecific EKG  changes and a negative cardiac enzyme panel.  He underwent urgent  cardiac catheterization late yesterday which showed severe three-vessel  disease with a proximal 95% stenosis of the LAD, total occlusion of the  right coronary, and an 80% stenosis of the circumflex.  His EF was 45%  to 50%, and left ventricular end-diastolic pressure was 17 mmHg.  There  was no evidence of mitral regurgitation.  Based on his coronary anatomy  and his symptoms, he was felt to be a candidate for surgical  revascularization.  The mammary artery was patent without proximal  subclavian stenosis at the time of cath.  He is currently stable on IV  nitroglycerin and heparin in the cardiac telemetry unit.   PAST MEDICAL HISTORY:  1. Hypertension.  2. History of lacunar CVA left parietal lobe, 2004; on chronic Plavix  therapy since that time.  3. Borderline diabetes controlled by diet with baseline hemoglobin A1c      6.5.  4. GERD.  History of esophageal dilatation 3 years ago.  5. No known drug allergies.  6. Status post a left hip replacement and inguinal hernia repair.   HOME MEDICATIONS:  1. Altace 2.5 mg a day.  2. Plavix 75 mg a day.  3. Aspirin 81 mg a day.  4. Zocor 20 mg nightly.   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  He does not smoke or drink alcohol.   FAMILY HISTORY:  Positive for myocardial infarction in his mother at age  65.  Positive for diabetes in his father.  One brother and one sister  have diabetes.  One brother died of a myocardial infarction.   REVIEW OF SYSTEMS:  CONSTITUTIONAL:  Review is negative for fever or  weight loss.  ENT:  Review is negative for dental problems or current  difficulty swallowing.  He did have an esophageal dilatation in the  past.  THORACIC:  Review is negative for chest trauma or abnormal chest  x-ray, or a productive cough.  CARDIAC:  Review is positive for coronary  disease.  ABDOMINAL:  Review is positive for his GERD.  Negative for  hepatitis, jaundice, blood per rectum.  UROLOGIC:  Negative for BPH or  prostate cancer.  ENDOCRINE:  Review is the negative for thyroid  disease, but positive for borderline diabetes.  VASCULAR:  Review is  positive for his previous stroke, at which time he had normal carotid  Dopplers.  Negative for DVT claudication.   PHYSICAL EXAMINATION:  The patient is 6 feet tall and weighs 174 pounds,  blood pressure is 158/77, pulse 80 and regular, saturation 98% on room  air.  GENERAL APPEARANCE:  A pleasant 77 year old male in no distress.  HEENT EXAM:  Normocephalic.  NECK:  Without JVD or mass.  He has a right carotid bruit.  LYMPHATICS:  Reveal no palpable supraclavicular or cervical adenopathy.  Breath sounds are clear and equal, and there is no thoracic  deformity.  CARDIAC EXAM:  Reveals regular rhythm without S3, gallop or murmur.  ABDOMINAL EXAM:  Slightly scaphoid but without pulsatile mass,  tenderness or bruit.  EXTREMITIES:  Reveal no clubbing, cyanosis, edema.  Peripheral pulses  are 2+ in all extremities and there is no venous insufficiency of the  lower extremities.  NEUROLOGIC EXAM:  Is alert and oriented.  He is right-hand dominant.   LABORATORY DATA:  I have reviewed the coronary arteriograms and his  chest x-ray.  He has severe 3-vessel disease with fairly well preserved  LV function with EF of 45% to 50%.  He would benefit from surgical  revascularization.   PLAN:  The patient will undergo carotid Doppler testing and a 2-D echo,  and we will allow a few days for Plavix washout before scheduling his  surgery 4 days from today.  This with plan was discussed with the  patient, including a detailed discussion of the operation.  He  understands and agrees.      Kerin Perna, M.D.  Electronically Signed     PV/MEDQ  D:  06/17/2007  T:  06/19/2007  Job:  [id number]

## 2011-01-05 NOTE — Discharge Summary (Signed)
NAMEAQIB, LOUGH NO.:  [id number]   MEDICAL RECORD NO.:  [id number]          PATIENT TYPE:  INP   LOCATION:  2017                         FACILITY:  [facility]   PHYSICIAN:  Kerin Perna, M.D.  DATE OF BIRTH:  [birth year]   DATE OF ADMISSION:  06/16/2007  DATE OF DISCHARGE:                               DISCHARGE SUMMARY   PRIMARY ADMITTING DIAGNOSIS:  Chest pain.   ADDITIONAL-DISCHARGE DIAGNOSES:  1. Severe three-vessel coronary artery disease.  2. Unstable angina.  3. Hypertension.  4. Gastroesophageal reflux disease.  5. History of cerebrovascular accident with mild residual right-sided      weakness.  6. History of migraines.  7. History of osteoarthritis.  8. Hyperlipidemia.   PROCEDURES PERFORMED:  1. Cardiac catheterization.  2. Coronary artery bypass grafting x3 (left internal mammary artery to      the LAD, saphenous vein graft to the obtuse marginal, saphenous      vein graft to the right coronary artery).  3. Endoscopic vein harvest, right leg.   HISTORY:  The patient is a 77 year old male who developed chest pain on  the date of this admission which was acute with shortness of breath and  dyspnea on exertion.  He had symptoms at least a month prior to this,  mainly dyspnea on exertion with dull chest tightness, but these episodes  would be relieved within 5-10 minutes with rest.  On this day, he was  taken to his primary care physician's office where he received baby  aspirin and sublingual nitroglycerin which reduced his chest discomfort,  but it did persist.  An EKG performed in the office showed some  nonspecific ST and T-wave changes, and he was subsequently transferred  to [facility] for further evaluation and treatment.  He was  seen by [facility], and it was felt that he should be admitted  for cardiac catheterization.   HOSPITAL COURSE:  Mr. Dollar and was taken to the cardiac catheterization  lab and underwent  catheterization by Dr. Gala Romney.  He was found to  have severe three-vessel coronary artery disease which was not felt to  be amenable to percutaneous intervention.  Because of these findings,  surgical consultation was obtained.  The patient was seen by Dr. Kathlee Nations Trigt.  His films were reviewed, and it was agreed that he should  proceed with CABG at this time.  He had been on chronic Plavix following  his stroke 4 years previously, and it was felt that he should undergo a  Plavix washout prior to proceeding with surgery.  He remained in the  hospital during his workup, and this included carotid Doppler studies  which showed moderate plaque in the bifurcation areas with extension  into the internal carotid and external carotid arteries bilaterally.  Velocities were in the 40-60% stenosis range.  Lower extremity Doppler  showed calcified vessels.  He remained stable and pain free on heparin.   He was taken to the operating room on June 21, 2007, here he  underwent CABG x3 as described in detail above, performed  by Dr. Donata Clay.  He tolerated the procedure well and was transferred to the SICU  in stable condition.  He was able to be extubated shortly after surgery.  He was hemodynamically stable and doing well on postop day #1.  At that  time, he was transferred to the step-down unit.   Overall, his postoperative course has been uneventful.  He has been  started on beta blocker, an ACE inhibitor, and a statin  as well as  aspirin and restarted on Plavix.  He had been somewhat volume overloaded  and has been started on Lasix to which he is responding well.  He has  been afebrile, and vital signs have been stable.  His labs on postop day  #2 showed hemoglobin of 10.3, hematocrit 30.8, white count 8.9,  platelets 250. Sodium 135, potassium 3.7 which has been replaced, BUN  10, creatinine 0.88.  He is ambulating the halls without difficulty.  He  is tolerating a regular diet and  having normal bowel and bladder  function.  It is felt that, if he continues to remain stable over the  next 24 hours, he will hopefully be ready for discharge home by June 25, 2007.   DISCHARGE MEDICATIONS:  1. Enteric-coated aspirin 81 mg daily.  2. Toprol XL 25 mg daily.  3. Altace 2.5 mg daily.  4. Lipitor 80 mg nightly.  5. Plavix 75 mg daily.  6. Multivitamin daily.  7. Oxycodone 5 mg one to two q.4 h p.r.n. for pain.   DISCHARGE INSTRUCTIONS:  He is asked to refrain from driving, heavy  lifting or strenuous activity.  He may continue ambulating daily and  using his incentive spirometer.  He may shower daily and clean his  incisions with soap and water.  He will continue low-fat, low-sodium  diet.   DISCHARGE FOLLOWUP:  He was need to make an appoint see Dr. Gala Romney in  2 weeks, and he will see Dr. Donata Clay in 3 weeks with a chest x-ray  from [facility].  Our office will contact him with this  appointment.  In the interim, he is asked to contact our office if he  experiences any problems or has questions.      Coral Ceo, P.A.      Kerin Perna, M.D.  Electronically Signed    GC/MEDQ  D:  06/24/2007  T:  06/25/2007  Job:  [id number]   cc:   [facility] Office  Bevelyn Buckles. Bensimhon, MD  Dr. Mikey Bussing

## 2011-01-05 NOTE — H&P (Signed)
NAMENEWTON, FRUTIGER NO.:  [id number]   MEDICAL RECORD NO.:  [id number]          PATIENT TYPE:  INP   LOCATION:  2033                         FACILITY:  [facility]   PHYSICIAN:  Luis Abed, MD, FACCDATE OF BIRTH:  1931-12-14   DATE OF ADMISSION:  06/16/2007  DATE OF DISCHARGE:                              HISTORY & PHYSICAL   BRIEF HISTORY:  Blake Burgess is a 77 year old white male who was referred  to [facility] emergency room for cardiac evaluation, secondary to chest  discomfort.   Blake Burgess describes at least a month history of dyspnea on exertion and  then a week history of anterior chest dull tightness, without radiation  and occasionally associated with shortness of breath but denies nausea,  vomiting or diaphoresis.  These episodes would primarily occur with  exertion and be relieved within 10 minutes of rest.  However, he had  occasional rest episodes.  He states that he has had several episodes  per day but cannot specifically quantify.  He states that he has not had  any nocturnal episodes and would give these episodes a 5 to 6 on scale  of 0 to 10.  This morning, he woke up as usual around 5:00 to eat  breakfast, and around 7:30 while sitting in a recliner, he gradually  developed similar symptoms.  He did notice some shortness of breath and  dyspnea on exertion, gave it a 5.  However, the discomfort did not go  away within 10 minutes as it had previously; thus, he informed his wife  and he went to his primary care physician around 9:00 this morning.  He  received a baby aspirin and a sublingual nitroglycerin which reduced his  discomfort, but it continued to persist.  An EKG in the office showed  normal sinus rhythm, delayed R-wave, non-specific ST-T wave changes.  CK-  MB and a troponin were within normal limits.  His son transported him to  [facility], where he received four baby aspirin, another sublingual  nitroglycerin, and now his discomfort  is a 1.  He denies prior  occurrences and states that this discomfort is different from his usual  GERD.   PAST MEDICAL HISTORY:  NO KNOWN DRUG ALLERGIES.   MEDICATIONS:  Prior to admission include:  1. Include Altace 2.5 mg daily.  2. Plavix 75 mg daily.  3. Aspirin 81 mg daily.  4. Zocor 20 mg q.h.s.   MEDICAL HISTORY:  Notable for hypertension, GERD with esophageal  dilatation, on July 17, 2002.  Type 2 diabetes which is diet-  controlled.  CVA in 2004 for which he was not hospitalized and has an  occasional residual right-sided weakness.  Carotid Dopplers were done on  August 21, 2003, showed antegrade bilateral vertebral flow, mild-to-  moderate thickening, and the right carotid artery Doppler showed only  mild to moderate flow abnormality without significant stenoses.  Left  carotid shows mild-to-moderate thickening plaque seen in all vessels.  Again, Doppler showed mild-to-moderate flow abnormality without  significant stenosis.  He has a history of remote migraines and  osteoarthritis, underwent a left hip replacement on October 2005 with  Dr. Charlann Boxer and has had several left inguinal hernia repairs.  He  specifically denies any bleeding dyscrasias.  No myocardial infarction,  COPD or thyroid disorder.  He does not know his cholesterol status.   SOCIAL HISTORY:  He resides in [place] with his wife.  He has one  adopted daughter and one son.  He has three grandchildren from his  adopted daughter, no great-grandchildren.  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  He denies any tobacco, alcohol, drugs,  herbal medication, tries to follow an ADA diet.  He does not exercise  specifically, but is very active on his farm.   FAMILY HISTORY:  His mother died at the age of 80 with a myocardial  infarction, history of hypertension.  His father died in the 3s of old  age.  History of diabetes.  He has one brother and one two sisters  living.  One brother  has a history of diabetes.  He has three brothers  deceased, one sister deceased of unknown causes.   REVIEW OF SYSTEMS:  Remote headaches, reading glasses, poor dentition.  He has had multiple skin cancers removed, rare cough, weakness on the  right side, hand arthralgias, GERD, and he does complain of rare food  dysphagia.  All other systems are negative.   PHYSICAL EXAMINATION:  GENERAL:  Well-nourished, well-developed pleasant  white male in no apparent distress.  VITAL SIGNS:  Temperature is 97.1, blood pressure initially is 163/177,  pulse 83, respirations 20, 98% sats on room air.  HEENT:  Is grossly unremarkable, except for poor dentition.  NECK:  Supple without thyromegaly, adenopathy, JVD.  He does have a loud  right carotid bruit.  CHEST/MEDICAL EXCURSION:  Lungs were clear to auscultation bilaterally.  HEART:  PMI is not displaced.  Regular rate and rhythm.  I do not  appreciate any murmurs, rubs, clicks or gallops.  All pulses are  symmetrical and intact.  However, DP and PT are approximately 1+.  I do  not appreciate any abdominal or femoral bruits.  SKIN/INTEGUMENT:  Appears to be intact; however, he has some old  evolving ecchymotic areas on his abdomen and on his chest.  ABDOMEN:  Slightly rounded.  Bowel sounds present without organomegaly, masses or  tenderness.  EXTREMITIES:  Negative cyanosis, clubbing or edema.  MUSCULOSKELETAL:  Unremarkable.  He does not have any chest wall  tenderness, even over the ecchymotic areas.  NEURO:  Grossly unremarkable.   Chest x-ray shows no active disease.  EKG in our emergency room shows  normal sinus rhythm, normal intervals, borderline LVH, nonspecific ST-T  wave changes, essentially no change from October 2005.  H&H is 13.0 and  38.6, normal indices,  platelets 400, WBCs 4.8.  Remaining labs were  pending.   IMPRESSION:  1. Chest discomfort without EKG changes, and initial CK-MB and      troponin at his primary care  physician's office are within normal      limits.  However, his history is concerning for coronary artery      disease.  2. Right carotid bruit with a history of remote stroke and Dopplers      that did not show any significant stenosis as in 2004.  History is      noted per past medical history and review of systems.   DISPOSITION:  Dr. Myrtis Ser reviewed the patient's history, spoke with and  examined the patient and agrees with the above.  Given his ongoing  systems and the prolonged nature of his discomfort this morning, it was  felt that he would be best served by cardiac catheterization to further  evaluate his symptoms and make definitive diagnosis in regards to  coronary artery disease.  The procedure, risks, benefits have been  explained to the patient and his son by Dr. Myrtis Ser,  and he agrees to proceed as planned.  Fortunately, we will be able to do  the heart catheterization this afternoon.  We will bolus him with IV  heparin, begin a beta blocker and continue his home medications.  Future  labs are pending which will be reviewed.  Future course is based on  findings.      Joellyn Rued, PA-C      Luis Abed, MD, [facility]  Electronically Signed    EW/MEDQ  D:  06/16/2007  T:  06/17/2007  Job:  [id number]   cc:   Blake Olp, NP - Dr. Carron Curie, MD, [facility]  Madlyn Frankel. Charlann Boxer, M.D.

## 2011-01-05 NOTE — Assessment & Plan Note (Signed)
[facility]                            CARDIOLOGY OFFICE NOTE   ISAACK, PREBLE                          MRN:          [id number]  DATE:01/04/2008                            DOB:          02-04-1932    PRIMARY CARE Synia Douglass:  Tommas Olp, nurse practitioner in [facility].   INTERVAL HISTORY:  Blake Burgess is a delightful 77 year old male with a history  of hypertension, hyperlipidemia and previous stroke.  He underwent  bypass surgery for three-vessel coronary artery disease in October 2008.  This went quite well.  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  He does not walk for exercise sake,  but does all kinds of activities around the farm without any limitation.  He has not had any heart failure.  His ejection fraction has been 50-  55%.   REVIEW OF SYSTEMS:  No orthopnea.  No PND.  No dizziness.  No syncope.  No presyncope.  No focal neurologic symptoms.  The remainder of the  review of systems is negative.   CURRENT MEDICATIONS:  1. Aspirin 81.  2. Metoprolol 25 a day.  3. Ramipril 5 a day.  4. Crestor 5 a day.  5. Plavix 75 a day.   PHYSICAL EXAMINATION:  GENERAL:  He is an elderly male in no acute  distress, ambulates around the clinic without any respiratory  difficulty.  VITAL SIGNS:  Blood pressure is 122/70, heart rate 74.  Weight is 175.  HEENT:  Normal.  NECK:  Supple.  No JVD.  Carotid 2+ bilaterally without bruits.  There  is no lymphadenopathy or thyromegaly.  CARDIAC:  Regular rate and rhythm.  No murmurs, rubs or gallops.  LUNGS:  Clear.  ABDOMEN:  Soft, nontender, nondistended.  No hepatosplenomegaly, no  bruits and no masses.  EXTREMITIES:  Warm with no cyanosis, clubbing or  edema.  No rash.  NEURO:  Alert and oriented x3.  Cranial nerves II-XII are intact.  Moves  all fours extremities without difficulty.  Affect is pleasant.   EKG shows sinus rhythm at 74.  No ST-T wave  changes.   ASSESSMENT/PLAN:  1. Coronary artery disease.  He is doing well.  Continue current      therapy.  No evidence of ischemia.  2. Hypertension.  Blood pressure is well-controlled.  If needed, could      consider switching his Toprol over to Coreg for better blood      pressure control as needed.  3. Hyperlipidemia.  Goal LDL is less than 70.  This is followed by Ms.      Brewer.  He was transitioned off high-dose Lipitor to Crestor due      to muscle aches.  4. Carotid artery stenosis, bilateral 40-60%.  He will need a follow-      up next year.   DISPOSITION:  Will see him back in the clinic in 6 months with a carotid  ultrasound.     Bevelyn Buckles. Bensimhon, MD  Electronically Signed    DRB/MedQ  DD: 01/04/2008  DT: 01/04/2008  Job #: [id number]

## 2011-01-05 NOTE — Assessment & Plan Note (Signed)
OFFICE VISIT   Blake Burgess, Blake Burgess  DOB:  1932/03/25                                        July 14, 2007  CHART #:  [id number]   HISTORY OF PRESENT ILLNESS:  The patient is a 77 year old male,  approximately 3 weeks status post his coronary revascularization,  specifically coronary artery bypass grafting x3 on June 21, 2007.  The procedure was done by Dr. Kathlee Nations Trigt.  He presented with  progressive fatigue and weakness, as well as chest tightness.  He  reports that he is doing quite well.  He has no significant issues  related to pain.  He is increasing his activities.  He has followed up  with Dr. Gala Romney, who has made some minor adjustments to his  medications.  He has no specific complaints.   PHYSICAL EXAMINATION:  Vital signs:  Blood pressure 121/69.  Heart rate  104.  Respiratory rate 18.  Oxygen saturation 98% on room air.  Cardiac  exam:  Regular rate and rhythm.  Normal S1 and S2.  No murmurs, gallops,  or rubs.  Pulmonary examination:  Slightly diminished in the left base,  otherwise clear.  Incisions healing well without evidence of infection.  Extremities:  No edema.   Chest x-ray:  The chest x-ray was reviewed, and it reveals a very small  left-sided effusion with no other acute abnormalities.   ASSESSMENT:  Blake Burgess is doing quite well following his surgery.  He is  encouraged to continue restrictions on heavy lifting to no more than 20  pounds over the next 2 months.  He will follow up with cardiac  rehabilitation.  We will see him in the surgical office on a p.r.n.  basis.   Blake Burgess, P.A.-C.   Blake Burgess  D:  07/14/2007  T:  07/15/2007  Job:  [id number]   cc:   Blake Burgess, M.D.  Blake Buckles. Bensimhon, MD  Blake Burgess, N.P.

## 2011-01-05 NOTE — Cardiovascular Report (Signed)
NAMEKENDALL, Blake Burgess NO.:  [id number]   MEDICAL RECORD NO.:  [id number]          PATIENT TYPE:  INP   LOCATION:  2033                         FACILITY:  [facility]   PHYSICIAN:  Bevelyn Buckles. Bensimhon, MDDATE OF BIRTH:  1932-08-02   DATE OF PROCEDURE:  06/16/2007  DATE OF DISCHARGE:                            CARDIAC CATHETERIZATION   PRIMARY CARE PHYSICIAN:  Dr. Mikey Bussing in [place].   PHYSICIAN'S ASSISTANT:  Tommas Olp.   PATIENT INDICATIONS:  Mr. Morrie Sheldon is a very pleasant 77 year old male,  history of diabetes, hypertension and hyperlipidemia.  He is admitted  with chest pain.  His EKG was non-acute.  Cardiac enzymes were normal.  He was brought to the cardiac cath lab for further evaluation of  unstable angina.   PROCEDURES PERFORMED:  1. Selective coronary angiography.  2. Left heart cath.  3. Left ventriculogram.  4. Left subclavian angiography for possible CABG.  5. Angio-Seal femoral closure.   DESCRIPTION OF PROCEDURE:  The risks and indications of the procedure  were reviewed.  Consent was signed and placed on the chart.  A 5-French  arterial sheath was placed in the right femoral artery, using a modified  Seldinger technique.  Standard catheters including a JL-4, JL-3.5, JR-4  and angled pigtail were used for procedure.  All catheter exchanges were  made over wire.  There are no apparent complications.  We did use a JL-4  to image the left circumflex in the JL 03/05 to image the LAD.   Central aortic pressure 167/71 a mean of 111.  LV pressure was 166/49  with an EDP of 17.  There is no aortic stenosis.   Left main:  Was absent.   LAD was a long vessel wrapping the apex.  It gave off several small  diagonals.  There was a 90% to 95% ostial lesion followed by a diffuse  70% to 80% lesion throughout the proximal and mid-portion that was  diffusely calcified.   Left circumflex:  Once again, he had a separate ostia, gave off an on OM-  1, an OM-2 and  a large OM-3.  There was a 70% to 80% lesion in mid-  portion of the left circumflex and a 50% to 60% lesion, more distally.  In the ostium of the small second OM, there was an 80% stenosis.   Right coronary artery was totally occluded proximally.  There was faint  filling of the distal right coronary artery through left-to-left  collaterals from the LAD and circumflex.   Left ventriculogram done in the RAO position showed an ejection fraction  approximately 45%, with inferior basilar akinesis which extends into the  mid-portion of the inferior wall.   Left subclavian was widely patent, with an intact LIMA.   ASSESSMENT:  1. Severe three-vessel coronary artery disease, as described above.  2. Mildly decreased left ventricular function.   I will plan for a CVTS consult for possible bypass.      Bevelyn Buckles. Bensimhon, MD  Electronically Signed     DRB/MEDQ  D:  06/16/2007  T:  06/18/2007  Job:  [id number]   cc:   Wannetta Sender.  Tommas Olp, PA

## 2011-01-05 NOTE — Assessment & Plan Note (Signed)
[facility]                            CARDIOLOGY OFFICE NOTE   Blake Burgess, Blake Burgess                          MRN:          [id number]  DATE:05/10/2008                            DOB:          1932/03/19    PRIMARY CARE Rawleigh Rode:  Tommas Olp, NP, [facility], office of Dr.  Lindwood Qua.   INTERVAL HISTORY:  Latoya is a delightful 77 year old male with history  of hypertension, hyperlipidemia, and previous stroke.  He also has a  history of coronary artery disease and underwent three-vessel bypass  surgery in October 2008 and ejection fraction has been in the 50-55%  range.   He returns today for routine followup.  He says he had good days and bad  days.  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  His chest has been sore at the  sternotomy site, but he denies any actual angina.  He had 2 days last  week when he felt really wiped out, but that has resolved.   CURRENT MEDICATIONS:  1. Aspirin 81.  2. Toprol 25 a day.  3. Ramipril 5 a day.  4. Crestor 5 a day.  5. Plavix 75 a day.   PHYSICAL EXAMINATION:  GENERAL:  He is well appearing in no acute  distress.  Ambulates around the clinic without any respiratory  difficulty.  VITAL SIGNS:  Blood pressure is 110/60, heart rate 71, and weight is  177.  HEENT:  Normal.  NECK:  Supple.  No JVD.  Carotids are 2+ bilaterally with a faint bruit  on the right.  There is no lymphadenopathy or thyromegaly.  CARDIAC:  PMI is nondisplaced.  He is regular with no murmurs, rubs, or  gallops.  The sternal incision looks good.  LUNGS:  Clear.  ABDOMEN:  Soft, nontender, and nondistended.  No hepatosplenomegaly.  No  bruits.  No masses.  Good bowel sounds.  EXTREMITIES:  Warm with no cyanosis, clubbing, or edema.  SKIN:  No rash.  NEURO:  Alert and oriented x3.  Cranial nerves  II-XII are intact.  Moves  all 4 extremities without difficulty.  Affect is pleasant.   ASSESSMENT AND PLAN:  1. Coronary artery disease.  He is status post bypass surgery.      Overall, he seems to be doing fairly well, but he does have a      persistent fatigue.  I think it is reasonable to put him on a      treadmill and do a treadmill Myoview to gauge his functional      capacity and reassess his ejection fraction and if there is any      ischemia.  2. Fatigue as above.  We will get a Myoview.  We will also check      baseline labs including a CBC, electrolytes, and a thyroid panel.  3. Hypertension.  Blood pressure is well controlled.  4. Hyperlipidemia.  Goal LDL is less than 70.  This is followed by Ms.      Brewer.  Titrate statin as needed.  5. Carotid stenosis.  This has been in the moderate range.  He is      scheduled for a followup ultrasound in the next few months.   DISPOSITION:  Depending on the results of his tests.  Otherwise, we will  see him back for routine followup in 6 months.     Bevelyn Buckles. Bensimhon, MD  Electronically Signed    DRB/MedQ  DD: 05/10/2008  DT: 05/11/2008  Job #: [id number]

## 2011-01-05 NOTE — Assessment & Plan Note (Signed)
Blake Burgess                            CARDIOLOGY OFFICE NOTE   Blake Burgess                          MRN:          [id number]  DATE:07/12/2007                            DOB:          Jan 21, 1932    PRIMARY Blake Burgess:  Tommas Olp, nurse practitioner in [facility].   INTERVAL HISTORY:  Blake Burgess is a delightful 77 year old male with a  history of hypertension, hyperlipidemia, and previous stroke who was  admitted last month with unstable angina.  He was found to have severe 3-  vessel coronary artery disease with an ejection fraction of 45%.  He  eventually underwent bypass surgery with Dr. Kathlee Nations Trigt with a LIMA  to the LAD, a saphenous vein graft to the obtuse marginal and a  saphenous vein graft to the right coronary artery.  Postoperative course  was uncomplicated.  He did have a carotid ultrasound in the hospital,  which showed 40-60% bilateral carotid stenosis disease.   He returns today for routine followup.  He is doing great.  No chest  pain, no shortness of breath.  He is ambulating around the house as  needed without any difficulty.   CURRENT MEDICATIONS:  1. Aspirin 81.  2. Ramipril 2.5 a day.  3. Lipitor 80 a day.  4. Enalapril 5 a day.  5. Metoprolol 25 a day.  6. Plavix 75 mg 1/2 tablet a day.   PHYSICAL EXAM:  He is well-appearing.  He ambulates around the clinic  without any respiratory difficulty.  Blood pressure is 122/64, heart rate is 93, weight is 168.  HEENT:  Normal.  NECK:  Supple.  There is no JVD.  Carotids are 2+ bilaterally with a  faint bruit on the right.  There is no lymphadenopathy or thyromegaly.  CARDIAC:  PMI is not displaced.  He is regular.  No murmur, rub, or  gallop.  Sternal incision is well-healed.  No evidence of infection.  LUNGS:  Clear.  ABDOMEN:  Soft, nontender, and nondistended.  There is no  hepatosplenomegaly.  No bruits.  No masses.  Good bowel sounds.  EXTREMITIES:  Warm with no  cyanosis, clubbing, or edema.  No rash.  NEURO:  He is alert and oriented x3.  Cranial nerves 2-12 are grossly  intact.  Moves all 4 extremities with difficulty.  Affect is very  pleasant.   EKG shows normal sinus rhythm with a rate of 93 with no ST-T wave  abnormalities.   ASSESSMENT AND PLAN:  1. Coronary artery disease status post bypass.  He is doing very well      with no evidence of ischemia.  He will follow up with Dr. Donata Clay      on Friday.  I suggested he go to cardiac rehab in [place].      Regarding his medications, I will continue him on aspirin.  He is      taking Plavix due to his stroke, and we will increase this back up      to 75 a day.  He is on 2 ACE inhibitors.  We will stop the      enalapril and put him on ramipril 5 mg a day.  2. Hypertension.  Blood pressure is well controlled.  See above for      changes.  3. Hyperlipidemia.  Goal LDL is less than 70.  We will continue      Lipitor.  Recheck lipids in 3 months.  4. Carotid stenosis.  This is moderate.  He will get a followup      ultrasound every year.   DISPOSITION:  I will see him back in the clinic in 3 months to check his  lipids and follow up his echocardiogram.     Blake Buckles. Bensimhon, MD  Electronically Signed    DRB/MedQ  DD: 07/12/2007  DT: 07/12/2007  Job #: [id number]   cc:   Tommas Olp, NP

## 2011-01-08 NOTE — Discharge Summary (Signed)
NAME:  Blake Burgess, Blake Burgess                 ACCOUNT NO.:  [id number]   MEDICAL RECORD NO.:  [id number]          PATIENT TYPE:  INP   LOCATION:  0474                         FACILITY:  [facility]   PHYSICIAN:  Madlyn Frankel. Charlann Boxer, M.D.  DATE OF BIRTH:  05-22-1932   DATE OF ADMISSION:  06/09/2004  DATE OF DISCHARGE:  06/13/2004                                 DISCHARGE SUMMARY   ADMISSION DIAGNOSES:  1.  Osteoarthritis, left hip.  2.  Hypertension.  3.  Gastroesophageal reflux disease.  4.  Borderline diabetes mellitus, diet controlled.  5.  History of cerebrovascular accident in 2004.  6.  Migraines.   DISCHARGE DIAGNOSES:  1.  Osteoarthritis, left hip; status post left total hip replacement      arthroplasty.  2.  Mild postoperative blood loss anemia, does not require transfusion.  3.  Hypertension.  4.  Gastroesophageal reflux disease.  5.  Borderline diabetes mellitus, diet controlled.  6.  History of cerebrovascular accident in 2004.  7.  Migraines.   PROCEDURE:  On June 09, 2004, total hip arthroplasty; surgeon, Dr.  Durene Romans; assistant, Alvera Novel, PA-C; anesthesia, general; blood  loss, 5000 cc; fluid replaced, 2500; drain, times one.   BRIEF HISTORY:  The patient is a pleasant 77 year old general who is  followed for left hip pain. He is noted to have severe degenerative joint  disease. He has failed conservative measures and wished to proceed with  surgical intervention. He was subsequently admitted to the hospital.   CONSULTATIONS:  None.   LABORATORY DATA:  Preoperative CBC reveals hemoglobin of 13.9, hematocrit  41.2, white count 5.1, differential within normal limits with the exception  of elevated of monos of 12, elevated eos of 6.  Postoperative H&H of 10.4  and 31.5. Last known H&H of 10.6 and 31.7.  Preoperative PT and PTT 11.9 and  26 respectively. INR 0.8. Serial protimes followed. Last known PT-INR 19.7  and 2.1. Chem panel on admission all within normal  limits. Serial BMETs were  followed. CO2 went up from 30 to 32 and remaining electrolytes within normal  limits.  Preop UA negative. Blood group A positive.   Preoperative EKG dated June 03, 2004, reveals normal sinus rhythm, normal  EKG, confirmed by Dr. Sharyn Lull. A two-view chest preoperatively reveals no  active disease, dated June 03, 2004. Left hip films dated June 03, 2004, reveals advanced degenerative arthritis. Pelvis film postoperatively  on June 09, 2004, reveals satisfactory appearance of left total hip  arthroplasty.   HOSPITAL COURSE:  The patient admitted to [facility], taken to the  operating room, and underwent the above-stated procedure without  complications. The patient tolerated the procedure well, later transferred  to the recovery room, and then to the orthopedic floor for continued  postoperative care. The patient was doing fairly well on day one with  exception of moderate pain. Hemovac was discontinued and pulled without  difficulty. Discharge planning called to assist with postoperative care. He  started to get up with physical therapy. By day two he was doing much  better, pain  was under much better control, dressings changed, incision was  healing well. Labs were within normal limits.  He started to progress  initially slowly with a physical therapy. He was up ambulating approximately  20 feet by day two, but then increased to 90 feet and 130 feet by day three.  He was doing quite well by day three and was progressing with physical  therapy. Discharge planning arranged post hospital care. By June 13, 2004, he was doing quite well, he was moving better with therapy, and he was  ready to go home.   DISCHARGE PLAN:  1.  The patient is discharged on June 13, 2004.  2.  Discharge diagnosis; please see above.  3.  Discharge medications: Percocet, Robaxin, and Coumadin.  4.  Activity: Partial weightbearing 50% to left lower extremity,  home health      PT and home health nursing, hip precautions.  5.  Diet: Low sodium diabetic diet.  6.  Follow-up: In two weeks after surgery; to call the office for an      appointment.   DISPOSITION:  Home.   CONDITION ON DISCHARGE:  Improved.      ALP/MEDQ  D:  07/22/2004  T:  07/22/2004  Job:  [id number]

## 2011-01-08 NOTE — Op Note (Signed)
NAME:  Blake Burgess, Blake Burgess                 ACCOUNT NO.:  [id number]   MEDICAL RECORD NO.:  [id number]          PATIENT TYPE:  INP   LOCATION:  0003                         FACILITY:  [facility]   PHYSICIAN:  Madlyn Frankel. Charlann Boxer, M.D.  DATE OF BIRTH:  Mar 28, 1932   DATE OF PROCEDURE:  06/09/2004  DATE OF DISCHARGE:                                 OPERATIVE REPORT   PREOPERATIVE DIAGNOSIS:  End-stage left hip osteoarthritis.   POSTOPERATIVE DIAGNOSIS:  End-stage left hip osteoarthritis.   PROCEDURE:  Left total hip replacement.  Components utilized are the Dupuy  hip system with a size 54 Pinnacle cup, 36 neutral liner, two cancellus bone  screws for the cup, an S-ROM hip system with a #1813, #18FXXL sleeve, 36  plus 12 offset stem with a 36 neutral plus 0 ball.   SURGEON:  Madlyn Frankel. Charlann Boxer, M.D.   ASSISTANTDruscilla Brownie. Cherlynn June.   ANESTHESIA:  General.   ESTIMATED BLOOD LOSS:  Was 500 mL.   FLUIDS REPLACED:  Was 2500 mL.   DRAINS:  Was x1.   COMPLICATIONS:  None apparent.   INDICATIONS FOR PROCEDURE:  The patient is a very pleasant 77 year old  gentleman who had been followed in the office for left hip pain.  He was  noted to have radiographically-severe degenerative joint disease present.  He had failed conservative measures and wished not to proceed with any  further conservative effort.  This would include the use of a cane,  cortisone injection and anti-inflammatories.  He had reached a point that he  felt that his quality of life was diminishing secondary to his pain, and he  wished to proceed with a left total hip replacement.  We reviewed the risks  and benefits, including bleeding, neurovascular injury, component injury, as  well as a discussion of the implants, including bearing surfaces; and based  on his age at 77 years of age, healthy and active, I felt that he would be a  candidate for a metal on metal bearing surface, with a theoretical benefit  of this lasting for the  remainder of his life without consequence.  After a  review of all of these risks and benefits, he would consent for a left total  hip replacement.   DESCRIPTION OF PROCEDURE:  The patient was brought to the operating theater.  Once adequate anesthesia and preoperative antibiotics, with 1 g of Ancef  were administered, the patient was positioned in the right lateral decubitus  position with the left side up.  The left lower extremity was then prepped  and draped in a sterile fashion.  A laterally-based incision was made for a  posterior approach to the hip.  The short external rotators were then taken  down and severed from the posterior capsule which was later repaired, to the  posterior trochanter.  Following hip exposure, the hip was dislocated.  The  neck osteotomy was made, based off of the preoperative radiographs and the  anatomical landmarks.  Debridement of the femoral osteophytes was carried  out at this time, especially on the neck.  Following  this the hip was  rotated out of the way, and attention was directed to the acetabulum.  The  acetabular exposure was obtained with the use of retractors.  The sciatic  nerve was palpated and retracted out of the way carefully with the use of  retraction of the posterior capsule.  The significant osteophytes were noted  throughout the circumferential aspect of the acetabulum, including an  ossified labrum.  Any soft tissue was debrided.  Reaming was commenced with  a 47 reamer and carried up.  The mid-osteophyte could obscure the true size  of the acetabulum; however, the reaming to the medial wall helped visualize  the true acetabulum.  I had excellent purchase reamed at 53, and I did not  need to ream further.  A final 54 cup was then impacted into position at 15  degrees of forward flexion and 35 to 40 degrees of abduction.  There was  excellent purchase.  Two cancellus bone screws were placed, however.  A  trial liner placed.  Using  the hip guide, the hip position was identified.  With the cup in position, a 5/8th osteotome was used to debride the anterior  osteophytes off of the acetabulum.  The posterior osteophytes were later  debrided upon determining that they were significant with external rotation  and extension of the hip.  Following this preparation of the acetabulum,  attention was directed to the femur.  The femur was exposed and prepared per  protocol for the S-ROM component with actual reaming carried up to a 13.5  reamer distally.  Proximal reaming was carried up to an 80F, with good  purchase.  Milling of the proximal femur was carried up to an XXL.  The  femoral stem was noted to be at about 10 degrees of anteversion.  I added  about 5 degrees in the final component.  The final component anteversion of  the patient's hip was about 45 degrees of anteversion.  With the trial  components in place, initially I tried a 36 plus 8 offset stem; however,  there was a significant amount of impingement, and for this reason I chose a  36 plus 12 offset stem, in order to provide that extra offset for clearance.  The leg lengths appeared equal with the 36 plus 0 ball.  The hip was stable  in the sleep position, external rotation and extension, particularly after  removing the posterior osteophytes, but had some evidence of anterior  impingement with forward flexion and internal rotation.  Time was spent at  this point to debride some of the anterior capsule.  After debriding the  anterior capsule, which was noted to be hypertrophied, the hip was much more  stable.  With these final determinations, the final components were brought  to the field.  The final 36 neutral liner was positioned and impacted into  the acetabulum without complications.  Note that a hole eliminator was used.  The final 80FXXL sleeve was then impacted to the level of the neck cut and where the trial reduction was placed.  The final stem was  positioned at  about 5 degrees of anteversion, based on the patient's neutral anatomy,  which gave him about 15-20 degrees of anteversion.  Again combined  anteversion was about 45 degrees.  The hip was copiously irrigated with  normal saline solution.  A medium Hemovac drain was placed.  The posterior  capsule was prepared to reapproximate to the posterior trochanter through  drill holes.  This was carried out.  The ilial tibial band was then  reapproximated using #1 Ethibond, and the gluteus fascia reapproximated  using a #1 Vicryl running.  The remainder of the wound was then closed in  layers with #4-0 Monocryl in the skin.   The patient's hip was then cleaned, dried and dressed sterilely with Steri-  Strips, dressing, sponges and tape.  The patient was extubated and  transferred to the recovery room in stable condition.      MDO/MEDQ  D:  06/09/2004  T:  06/09/2004  Job:  [id number]

## 2011-01-08 NOTE — H&P (Signed)
NAME:  Blake Burgess, Blake Burgess                 ACCOUNT NO.:  [id number]   MEDICAL RECORD NO.:  [id number]          PATIENT TYPE:  INP   LOCATION:  NA                           [facility]ACILITY[facility]  [facility]   PHYSICIAN:  Madlyn Frankel. Charlann Boxer, M.D.  DATE OF BIRTH:  1932-02-03   DATE OF ADMISSION:  06/09/2004  DATE OF DISCHARGE:                                HISTORY & PHYSICAL   The patient IS A 77 year old gentleman who comes in complaining about left  hip pain.   HISTORY OF PRESENT ILLNESS:  Left hip pain has been off and on for many  years, refractory to any type of conservative management.  The patient was a  little bit confused, was complaining about groin pain, thought it was not  his hip.  The patient is to have a left total hip arthroplasty completed by  Dr. Charlann Boxer on Tuesday, October 18.   ALLERGIES:  None.   MEDICATIONS:  1.  Plavix 75 mg daily.  2.  Aspirin 81 mg daily.  3.  Altace one tablet daily, the patient is unsure of the dosage.   PAST MEDICAL HISTORY:  1.  Hypertension.  2.  GERD.  3.  Borderline diabetes controlled with diet.  4.  CVA in 2004.  5.  Migraines.   FAMILY MEDICAL HISTORY:  Diabetes mellitus, coronary artery disease.   SOCIAL HISTORY:  The patient is a retired Visual merchandiser.  He is married.  He has a  two-floor house with 12 stairs.   REVIEW OF SYSTEMS:  The patient is having difficulty ambulating with pain in  that left hip that has been going off and on for many years.   PHYSICAL EXAMINATION:  VITAL SIGNS:  Pulse 80, respirations 18, blood  pressure 152/78.  GENERAL:  The patient is a 77 year old healthy-appearing male in no acute  distress, pleasant mood and affect.  Alert and oriented x3.  HEENT:  Examination of the head and neck shows cranial nerves II-XII are  grossly intact.  He has full range of motion of his cervical spine with no  tenderness to palpation of paraspinal muscles or spinous processes.  CHEST:  Examination of the lungs shows active breath sounds with no  wheezes,  rhonchi, or rales.  CARDIAC:  Regular rate and rhythm, no murmur.  ABDOMEN:  Nontender, nondistended, active bowel sounds in all four  quadrants.  No pulsatile masses.  EXTREMITIES:  Examination of bilateral upper extremities shows full range of  motion, sensation grossly intact, no gross deformity.  There is moderate  tenderness to the left hip extending into his left groin with any type of  movement or weightbearing.  The skin shows no rashes.  Neurovascularly he is  intact distally, and no pedal edema.  Strength is 5/5 bilaterally in the  quadriceps, hamstrings, and plantar and dorsiflexion.   X-rays show severe left hip osteoarthritis.   IMPRESSION:  Left hip osteoarthritis, severe.   PLAN:  Left total hip arthroplasty to be completed by Madlyn Frankel. Charlann Boxer, M.D.,  on Tuesday, June 09, 2004.      TBD/MEDQ  D:  06/03/2004  T:  06/03/2004  Job:  [id number]

## 2011-01-11 ENCOUNTER — Ambulatory Visit (INDEPENDENT_AMBULATORY_CARE_PROVIDER_SITE_OTHER): Payer: Medicare Other | Admitting: Internal Medicine

## 2011-01-11 ENCOUNTER — Encounter: Payer: Self-pay | Admitting: Internal Medicine

## 2011-01-11 VITALS — BP 150/68 | HR 67 | Resp 14 | Ht 68.0 in | Wt 181.0 lb

## 2011-01-11 DIAGNOSIS — I10 Essential (primary) hypertension: Secondary | ICD-10-CM

## 2011-01-11 DIAGNOSIS — I2510 Atherosclerotic heart disease of native coronary artery without angina pectoris: Secondary | ICD-10-CM

## 2011-01-11 MED ORDER — AMLODIPINE BESYLATE 5 MG PO TABS: 5.0000 mg | ORAL_TABLET | Freq: Every day | ORAL | Status: DC

## 2011-01-11 NOTE — Assessment & Plan Note (Signed)
Doing well. Recent cath reassuring. Continue current regimen.

## 2011-01-11 NOTE — Patient Instructions (Signed)
Start Amlodipine 5 mg daily    Your physician recommends that you schedule a follow-up appointment in: 2 months.  

## 2011-01-11 NOTE — Progress Notes (Signed)
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  He also has a history of coronary artery disease and underwent three-vessel bypass surgery in October 2008 and ejection fraction has been in the 50-55%.  We have been following him closely recently due to severe HTN. Has had multiple recent changes in his BP medicines.  Renal u/s 1/12: 1-59% mild R RAS. Left ok.  I recently saw him for an exercise treadmill test.  This was abnormal and we set him up for cardiac catheterization.  This was done on April 10, 2012and demonstrated 3 of 3 patent bypass grafts and normal LV function.   He returns for f/u.   He saw Tereso Newcomer recently and continued to note occasional atypical chest pain. Amlodipine restarted for SBP 180s. Also tried low dose lasix due to BNP 150. Lasix made him feel terrible so he stopped. Feels pretty good except for fatigue. No dyspnea. Occasional mild edema. Feels bad if SBP < 140.   ROS: All other systems normal except as listed in the HPI and Problem List.     Past Medical History  Diagnosis Date  . Other and unspecified hyperlipidemia   . Hypertension   . Coronary artery disease     a.  s/p CABG;   b. cath 4/12: EF 55%, 3vCAD, patent L-LAD, patent S-RCA, patent S-CFX (done 2/2 false pos. ETT)  . Schatzki's ring   . HH (hiatus hernia)   . Diverticular disease   . Hemorrhoids   . Stroke   . GI bleed   . Osteoarthritis     Current Outpatient Prescriptions  Medication Sig Dispense Refill  . acetaminophen (TYLENOL ARTHRITIS PAIN) 650 MG CR tablet Take 650 mg by mouth daily.        Marland Kitchen aspirin 81 MG tablet Take 81 mg by mouth daily.        Marland Kitchen co-enzyme Q-10 30 MG capsule Take 100 mg by mouth daily.       . enalapril (VASOTEC) 20 MG tablet Take 20 mg by mouth 2 (two) times daily.        Marland Kitchen LIPITOR 10 MG tablet Take 10 mg by mouth daily.       . metoprolol  (TOPROL-XL) 100 MG 24 hr tablet Take 100 mg by mouth daily.        Marland Kitchen omeprazole (PRILOSEC) 20 MG capsule Take 20 mg by mouth 2 (two) times daily.       Marland Kitchen DISCONTD: amLODipine (NORVASC) 5 MG tablet TAKE ONLY 1/2 (HALF) TABLET FOR THE FIRST 2-3 DAYS, THEN INCREASE TO 1 TABLET DAILY  30 tablet  11  . DISCONTD: furosemide (LASIX) 20 MG tablet Take 1 tablet (20 mg total) by mouth daily.  30 tablet  11  . DISCONTD: potassium chloride (KLOR-CON) 10 MEQ CR tablet Take 1 tablet (10 mEq total) by mouth daily.  30 tablet  11    Allergies  Allergen Reactions  . Triple Antibiotic    History  Substance Use Topics  . Smoking status: Never Smoker   . Smokeless tobacco: Not on file  . Alcohol Use: No    ROS:    See history of present illness.  He denies fevers, chills, cough, wheezing.  He denies melena or hematochezia.  All other systems reviewed and negative.  Vital Signs: BP 150/68  Pulse 67  Resp 14  Ht 5\' 8"  (1.727 m)  Wt 181 lb (82.101 kg)  BMI 27.52 kg/m2  PHYSICAL EXAM: Well nourished, well developed, in no acute distress HEENT: normal Neck: no JVD Cardiac:  normal S1, S2; RRR; no murmur Lungs:  clear to auscultation bilaterally, no wheezing, rhonchi or rales Abd: soft, nontender, no hepatomegaly Ext: no edema; RFA site without hematoma or bruit Skin: warm and dry Neuro:  CNs 2-12 intact, no focal abnormalities noted    ASSESSMENT AND PLAN:

## 2011-01-11 NOTE — Assessment & Plan Note (Signed)
BP up. Will restart amlodipine at 5mg /day. Given how symptomatic he gets with SBP < 140. Will compromise a bit and try to keep systolics around 140-145 and see how he does. Perhaps we can come down gradually over the course of the year.

## 2011-02-05 ENCOUNTER — Ambulatory Visit: Payer: Medicare Other | Admitting: Internal Medicine

## 2011-03-18 ENCOUNTER — Encounter: Payer: Self-pay | Admitting: Internal Medicine

## 2011-03-22 ENCOUNTER — Ambulatory Visit (INDEPENDENT_AMBULATORY_CARE_PROVIDER_SITE_OTHER): Payer: Medicare Other | Admitting: Internal Medicine

## 2011-03-22 ENCOUNTER — Encounter (INDEPENDENT_AMBULATORY_CARE_PROVIDER_SITE_OTHER): Payer: Medicare Other

## 2011-03-22 ENCOUNTER — Encounter: Payer: Self-pay | Admitting: Internal Medicine

## 2011-03-22 VITALS — BP 134/64 | HR 61 | Resp 14 | Ht 67.0 in | Wt 168.0 lb

## 2011-03-22 DIAGNOSIS — R0989 Other specified symptoms and signs involving the circulatory and respiratory systems: Secondary | ICD-10-CM

## 2011-03-22 DIAGNOSIS — I10 Essential (primary) hypertension: Secondary | ICD-10-CM

## 2011-03-22 NOTE — Patient Instructions (Signed)
48 hr Ambulatory BP cuff  Your physician recommends that you schedule a follow-up appointment in: 3 months.

## 2011-03-22 NOTE — Progress Notes (Signed)
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  He also has a history of coronary artery disease and underwent three-vessel bypass surgery in October 2008 and ejection fraction has been in the 50-55%.  We have been following him closely recently due to severe HTN. Has had multiple recent changes in his BP medicines.  Renal u/s 1/12: 1-59% mild R RAS. Left ok.  Underwent exercise treadmill test in march 2012.  This was abnormal and we set him up for cardiac catheterization.  This was done on December 01, 2010 and demonstrated 3 of 3 patent bypass grafts and normal LV function.   He returns for f/u.   Over past year we have been aggressively titrating his BP meds. However this has been complicated by the fact that he feels miserable if SBP < 140.  Also tried low dose lasix due to BNP 150. Lasix made him feel terrible so he stopped.   In May we added back amlodipine 5mg /day for BP 150/88. York Spaniel it made him feel terrible so he stopped. Only takes it once in a while. Feels pretty good except for fatigue. No dyspnea or CP. Diarrhea x 2 months - seeing Tommas Olp. Taking taking BP regularly at home but feels that it is up and down.    ROS: All other systems normal except as listed in the HPI and Problem List.     Past Medical History  Diagnosis Date  . Other and unspecified hyperlipidemia   . Hypertension   . Coronary artery disease     a.  s/p CABG;   b. cath 4/12: EF 55%, 3vCAD, patent L-LAD, patent S-RCA, patent S-CFX (done 2/2 false pos. ETT)  . Schatzki's ring   . HH (hiatus hernia)   . Diverticular disease   . Hemorrhoids   . Stroke   . GI bleed   . Osteoarthritis     Current Outpatient Prescriptions  Medication Sig Dispense Refill  . acetaminophen (TYLENOL ARTHRITIS PAIN) 650 MG CR tablet Take 650 mg by mouth daily.        Marland Kitchen aspirin 81 MG tablet Take 81 mg by mouth  daily.        Marland Kitchen co-enzyme Q-10 30 MG capsule Take 100 mg by mouth daily.       . enalapril (VASOTEC) 20 MG tablet Take 20 mg by mouth 2 (two) times daily.        Marland Kitchen LIPITOR 10 MG tablet Take 10 mg by mouth daily.       . metoprolol (TOPROL-XL) 100 MG 24 hr tablet Take 100 mg by mouth daily.        Marland Kitchen omeprazole (PRILOSEC) 20 MG capsule Take 20 mg by mouth 2 (two) times daily.         Allergies  Allergen Reactions  . Triple Antibiotic    History  Substance Use Topics  . Smoking status: Never Smoker   . Smokeless tobacco: Not on file  . Alcohol Use: No    ROS:    See history of present illness.  He denies fevers, chills, cough, wheezing.  He denies melena or hematochezia.  All other systems reviewed and negative.  Vital Signs: BP 128/61  Pulse 61  Resp 14  Ht 5\' 7"  (1.702 m)  Wt 168 lb (76.204 kg)  BMI 26.31 kg/m2  PHYSICAL EXAM: Well nourished, well developed, in no acute distress HEENT: normal  Neck: no JVD Cardiac:  normal S1, S2; RRR; no murmur Lungs:  clear to auscultation bilaterally, no wheezing, rhonchi or rales Abd: soft, nontender, no hepatomegaly Ext: no edema; RFA site without hematoma or bruit Skin: warm and dry Neuro:  CNs 2-12 intact, no focal abnormalities noted  ECG: Sinus brady 61 occasional PVC 1avb 200 ms   No ST-T wave abnormalities.    ASSESSMENT AND PLAN:

## 2011-05-24 LAB — DIFFERENTIAL
Basophils Absolute: 0
Basophils Relative: 0
Eosinophils Absolute: 0
Eosinophils Relative: 0
Lymphocytes Relative: 13
Lymphs Abs: 1
Monocytes Absolute: 0.9
Monocytes Relative: 13 — ABNORMAL HIGH
Neutro Abs: 5.4
Neutrophils Relative %: 74

## 2011-05-24 LAB — POCT I-STAT, CHEM 8
BUN: 13
Calcium, Ion: 1.14
Chloride: 101
Creatinine, Ser: 1.2
Glucose, Bld: 117 — ABNORMAL HIGH
HCT: 40
Hemoglobin: 13.6
Potassium: 4.1
Sodium: 136
TCO2: 24

## 2011-05-24 LAB — CBC
HCT: 39.8
Hemoglobin: 13.1
MCHC: 33
MCV: 97.4
Platelets: 275
RBC: 4.09 — ABNORMAL LOW
RDW: 12.7
WBC: 7.4

## 2011-06-01 LAB — BASIC METABOLIC PANEL
BUN: 10
CO2: 27
Calcium: 8.5
Chloride: 102
Creatinine, Ser: 0.83
GFR calc Af Amer: >60
GFR calc non Af Amer: >60
Glucose, Bld: 105 — ABNORMAL HIGH
Potassium: 3.6
Sodium: 138

## 2011-06-02 LAB — COMPREHENSIVE METABOLIC PANEL
AST: 15
BUN: 8
CO2: 24
Chloride: 104
Creatinine, Ser: 0.72
GFR calc Af Amer: >60
GFR calc non Af Amer: >60
Glucose, Bld: 95
Total Bilirubin: 0.6

## 2011-06-02 LAB — BLOOD GAS, ARTERIAL
Acid-Base Excess: 0.3
Bicarbonate: 23.7
FIO2: 0.21
O2 Saturation: 97.2
TCO2: 24.8
pO2, Arterial: 84.3

## 2011-06-02 LAB — BASIC METABOLIC PANEL
BUN: 10
BUN: 10
BUN: 8
CO2: 23
CO2: 25
CO2: 27
CO2: 28
Calcium: 8.2 — ABNORMAL LOW
Calcium: 8.3 — ABNORMAL LOW
Calcium: 8.3 — ABNORMAL LOW
Calcium: 8.7
Chloride: 100
Chloride: 105
Chloride: 107
Chloride: 109
Creatinine, Ser: 0.87
Creatinine, Ser: 0.88
Creatinine, Ser: 0.95
GFR calc Af Amer: >60
GFR calc Af Amer: >60
GFR calc Af Amer: >60
GFR calc Af Amer: >60
GFR calc non Af Amer: >60
GFR calc non Af Amer: >60
GFR calc non Af Amer: >60
GFR calc non Af Amer: >60
Glucose, Bld: 114 — ABNORMAL HIGH
Glucose, Bld: 115 — ABNORMAL HIGH
Glucose, Bld: 76
Glucose, Bld: 96
Potassium: 3.5
Potassium: 3.7
Potassium: 3.9
Potassium: 4.3
Sodium: 135
Sodium: 138
Sodium: 139
Sodium: 141

## 2011-06-02 LAB — POCT I-STAT 4, (NA,K, GLUC, HGB,HCT)
Glucose, Bld: 123 — ABNORMAL HIGH
Glucose, Bld: 127 — ABNORMAL HIGH
Glucose, Bld: 87
HCT: 30 — ABNORMAL LOW
HCT: 33 — ABNORMAL LOW
Hemoglobin: 10.2 — ABNORMAL LOW
Hemoglobin: 11.2 — ABNORMAL LOW
Hemoglobin: 11.6 — ABNORMAL LOW
Hemoglobin: 7.5 — CL
Operator id: 3406
Operator id: 3406
Potassium: 3.8
Potassium: 3.8
Potassium: 4.3
Potassium: 4.9
Sodium: 133 — ABNORMAL LOW
Sodium: 134 — ABNORMAL LOW
Sodium: 139

## 2011-06-02 LAB — CBC
HCT: 27.8 — ABNORMAL LOW
HCT: 30.8 — ABNORMAL LOW
HCT: 30.8 — ABNORMAL LOW
HCT: 31.8 — ABNORMAL LOW
HCT: 35.7 — ABNORMAL LOW
HCT: 36 — ABNORMAL LOW
HCT: 36.2 — ABNORMAL LOW
HCT: 38.6 — ABNORMAL LOW
Hemoglobin: 10.3 — ABNORMAL LOW
Hemoglobin: 10.5 — ABNORMAL LOW
Hemoglobin: 10.7 — ABNORMAL LOW
Hemoglobin: 12.1 — ABNORMAL LOW
Hemoglobin: 12.5 — ABNORMAL LOW
Hemoglobin: 9.4 — ABNORMAL LOW
MCHC: 33.4
MCHC: 33.6
MCHC: 33.6
MCHC: 33.7
MCHC: 33.7
MCHC: 33.8
MCHC: 34.1
MCHC: 34.5
MCV: 93
MCV: 94.8
MCV: 94.8
MCV: 94.9
MCV: 95.2
MCV: 95.6
MCV: 96.7
MCV: 96.7
MCV: 97.3
Platelets: 212
Platelets: 216
Platelets: 250
Platelets: 253
Platelets: 333
Platelets: 344
Platelets: 352
Platelets: 366
RBC: 2.92 — ABNORMAL LOW
RBC: 3.22 — ABNORMAL LOW
RBC: 3.31 — ABNORMAL LOW
RBC: 3.36 — ABNORMAL LOW
RBC: 3.72 — ABNORMAL LOW
RBC: 3.82 — ABNORMAL LOW
RBC: 3.99 — ABNORMAL LOW
RDW: 12.4
RDW: 12.7
RDW: 12.8
RDW: 13.5
RDW: 13.5
RDW: 13.8
RDW: 13.9
WBC: 4.8
WBC: 7.3
WBC: 7.9
WBC: 7.9
WBC: 8.9
WBC: 9.9

## 2011-06-02 LAB — CREATININE, SERUM
Creatinine, Ser: 0.86
GFR calc Af Amer: >60
GFR calc non Af Amer: >60

## 2011-06-02 LAB — CROSSMATCH
ABO/RH(D): A POS
Antibody Screen: NEGATIVE

## 2011-06-02 LAB — LIPID PANEL
HDL: 36 — ABNORMAL LOW
Triglycerides: 57
VLDL: 11

## 2011-06-02 LAB — POCT I-STAT 3, ART BLOOD GAS (G3+)
Acid-base deficit: 1
Acid-base deficit: 2
Acid-base deficit: 3 — ABNORMAL HIGH
Bicarbonate: 23.1
Bicarbonate: 23.8
Bicarbonate: 25.9 — ABNORMAL HIGH
O2 Saturation: 100
O2 Saturation: 96
O2 Saturation: 98
Operator id: 3406
TCO2: 23
TCO2: 24
TCO2: 25
TCO2: 27
pCO2 arterial: 33.3 — ABNORMAL LOW
pCO2 arterial: 44.4
pH, Arterial: 7.378
pH, Arterial: 7.405
pO2, Arterial: 119 — ABNORMAL HIGH
pO2, Arterial: 276 — ABNORMAL HIGH
pO2, Arterial: 388 — ABNORMAL HIGH

## 2011-06-02 LAB — PREPARE PLATELET PHERESIS

## 2011-06-02 LAB — HEMOGLOBIN A1C
Hgb A1c MFr Bld: 6.5 — ABNORMAL HIGH
Mean Plasma Glucose: 154

## 2011-06-02 LAB — APTT
aPTT: 29
aPTT: 37

## 2011-06-02 LAB — PREPARE FRESH FROZEN PLASMA

## 2011-06-02 LAB — I-STAT EC8
Acid-base deficit: 3 — ABNORMAL HIGH
Bicarbonate: 22.9
Glucose, Bld: 124 — ABNORMAL HIGH
Sodium: 140
TCO2: 24
pH, Arterial: 7.342 — ABNORMAL LOW

## 2011-06-02 LAB — MAGNESIUM
Magnesium: 2.7 — ABNORMAL HIGH
Magnesium: 3 — ABNORMAL HIGH

## 2011-06-02 LAB — CARDIAC PANEL(CRET KIN+CKTOT+MB+TROPI)
CK, MB: 2.2
Total CK: 67
Troponin I: 0.03
Troponin I: 0.04

## 2011-06-02 LAB — PROTIME-INR
INR: 0.9
INR: 1.4
Prothrombin Time: 17.6 — ABNORMAL HIGH

## 2011-06-02 LAB — HEMOGLOBIN AND HEMATOCRIT, BLOOD: HCT: 23.2 — ABNORMAL LOW

## 2011-06-02 LAB — CK TOTAL AND CKMB (NOT AT ARMC): Relative Index: INVALID

## 2011-06-02 LAB — HEPARIN LEVEL (UNFRACTIONATED)
Heparin Unfractionated: 0.55
Heparin Unfractionated: 0.64

## 2011-06-02 LAB — TSH: TSH: 1.306

## 2011-06-02 LAB — POCT I-STAT GLUCOSE: Operator id: 190201

## 2011-06-02 LAB — TROPONIN I: Troponin I: 0.01

## 2012-02-04 ENCOUNTER — Encounter (HOSPITAL_COMMUNITY): Payer: Self-pay | Admitting: Physical Medicine and Rehabilitation

## 2012-02-04 ENCOUNTER — Emergency Department (HOSPITAL_COMMUNITY): Payer: Medicare Other

## 2012-02-04 ENCOUNTER — Emergency Department (HOSPITAL_COMMUNITY)
Admission: EM | Admit: 2012-02-04 | Discharge: 2012-02-04 | Disposition: A | Discharge status: Home / Self Care | Payer: Medicare Other | Attending: Emergency Medicine | Admitting: Emergency Medicine

## 2012-02-04 DIAGNOSIS — R42 Dizziness and giddiness: Secondary | ICD-10-CM | POA: Insufficient documentation

## 2012-02-04 DIAGNOSIS — I25810 Atherosclerosis of coronary artery bypass graft(s) without angina pectoris: Secondary | ICD-10-CM | POA: Insufficient documentation

## 2012-02-04 DIAGNOSIS — R0789 Other chest pain: Secondary | ICD-10-CM | POA: Insufficient documentation

## 2012-02-04 DIAGNOSIS — R079 Chest pain, unspecified: Secondary | ICD-10-CM | POA: Insufficient documentation

## 2012-02-04 DIAGNOSIS — I10 Essential (primary) hypertension: Secondary | ICD-10-CM | POA: Insufficient documentation

## 2012-02-04 DIAGNOSIS — R059 Cough, unspecified: Secondary | ICD-10-CM | POA: Insufficient documentation

## 2012-02-04 DIAGNOSIS — R05 Cough: Secondary | ICD-10-CM | POA: Insufficient documentation

## 2012-02-04 DIAGNOSIS — H538 Other visual disturbances: Secondary | ICD-10-CM | POA: Insufficient documentation

## 2012-02-04 DIAGNOSIS — R51 Headache: Secondary | ICD-10-CM

## 2012-02-04 LAB — DIFFERENTIAL
Basophils Absolute: 0 10*3/uL (ref 0.0–0.1)
Basophils Relative: 1 % (ref 0–1)
Eosinophils Relative: 6 % — ABNORMAL HIGH (ref 0–5)
Monocytes Absolute: 0.6 10*3/uL (ref 0.1–1.0)

## 2012-02-04 LAB — POCT I-STAT TROPONIN I: Troponin i, poc: 0 ng/mL (ref 0.00–0.08)

## 2012-02-04 LAB — BASIC METABOLIC PANEL
BUN: 19 mg/dL (ref 6–23)
Calcium: 9 mg/dL (ref 8.4–10.5)
GFR calc non Af Amer: 81 mL/min — ABNORMAL LOW (ref >90)
Glucose, Bld: 139 mg/dL — ABNORMAL HIGH (ref 70–99)
Sodium: 140 mEq/L (ref 135–145)

## 2012-02-04 LAB — CBC
HCT: 38.7 % — ABNORMAL LOW (ref 39.0–52.0)
MCHC: 33.6 g/dL (ref 30.0–36.0)
MCV: 95.3 fL (ref 78.0–100.0)
RDW: 12.5 % (ref 11.5–15.5)

## 2012-02-04 MED ORDER — TRAMADOL HCL 50 MG PO TABS: 50.0000 mg | ORAL_TABLET | Freq: Four times a day (QID) | ORAL | Status: AC | PRN

## 2012-02-04 MED ORDER — MORPHINE SULFATE 4 MG/ML IJ SOLN
4.0000 mg | Freq: Once | INTRAMUSCULAR | Status: AC
  Administered 2012-02-04: 4 mg via INTRAVENOUS
  Filled 2012-02-04: qty 1

## 2012-02-04 MED ORDER — ONDANSETRON HCL 4 MG/2ML IJ SOLN
4.0000 mg | Freq: Once | INTRAMUSCULAR | Status: AC
  Administered 2012-02-04: 4 mg via INTRAVENOUS
  Filled 2012-02-04: qty 2

## 2012-02-04 MED ORDER — SODIUM CHLORIDE 0.9 % IV BOLUS (SEPSIS)
500.0000 mL | Freq: Once | INTRAVENOUS | Status: AC
  Administered 2012-02-04: 500 mL via INTRAVENOUS

## 2012-02-04 NOTE — Discharge Instructions (Signed)

## 2012-02-04 NOTE — ED Provider Notes (Signed)
History     CSN: [id number]  Arrival date & time 02/04/12  1118   First MD Initiated Contact with Patient 02/04/12 1324      Chief Complaint  Patient presents with  . Chest Pain  . Headache    (Consider location/radiation/quality/duration/timing/severity/associated sxs/prior treatment) HPI Pt has had 2 days of episodic HA with blurred vision. Pt is unable to describe pain. No N/v, photophobia, focal weakness or numbness. Pt c/o chronic nasal congestion. No fever or chills. No neck pain or meningismus. Pt also states he has had episodic chest pain only last a few sec then resolves. This has been more chronic in nature. Currently no chest pain. Pain does not radiate or seem to be associated with HA.   Past Medical History  Diagnosis Date  . Other and unspecified hyperlipidemia   . Hypertension   . Coronary artery disease     a.  s/p CABG;   b. cath 4/12: EF 55%, 3vCAD, patent L-LAD, patent S-RCA, patent S-CFX (done 2/2 false pos. ETT)  . Schatzki's ring   . HH (hiatus hernia)   . Diverticular disease   . Hemorrhoids   . Stroke   . GI bleed   . Osteoarthritis     Past Surgical History  Procedure Date  . Coronary artery bypass graft 06/21/2007    CABG x 3 Surgeon Kerin Perna, MD  . Hip replace 06/09/2004    left hip Surgeon Madlyn Frankel. Charlann Boxer, MD  . Arteriovenous graft placement w/ endoscopic vein harvest     of the right leg greater spahenous vein. Surgeon: Kathlee Nations Trigt,M.D.  . Colonoscopy 02/24/2010    Hemorrhoids, Diverticulosis. Performed at [facility] hospital. Normal terminal ileum. Dr. Jovita Gamma, [facility]    Family History  Problem Relation Age of Onset  . Heart attack Mother   . Hypertension Mother   . Diabetes Father   . Diabetes Brother   . Diabetes Sister     History  Substance Use Topics  . Smoking status: Never Smoker   . Smokeless tobacco: Not on file  . Alcohol Use: No      Review of Systems  Constitutional: Negative for fever, chills and diaphoresis.    HENT: Positive for congestion. Negative for neck pain.   Eyes: Negative for photophobia.  Respiratory: Negative for shortness of breath.   Cardiovascular: Positive for chest pain. Negative for palpitations and leg swelling.  Gastrointestinal: Negative for nausea, vomiting and abdominal pain.  Musculoskeletal: Negative for back pain.  Skin: Negative for pallor and rash.  Neurological: Positive for light-headedness and headaches. Negative for weakness and numbness.    Allergies  Neomycin-bacitracin zn-polymyx  Home Medications   Current Outpatient Rx  Name Route Sig Dispense Refill  . ACETAMINOPHEN ER 650 MG PO TBCR Oral Take 650 mg by mouth daily as needed. For pain    . ASPIRIN 81 MG PO TABS Oral Take 81 mg by mouth daily.      . ENALAPRIL MALEATE 20 MG PO TABS Oral Take 20 mg by mouth 2 (two) times daily.      Marland Kitchen LIPITOR 10 MG PO TABS Oral Take 10 mg by mouth daily.     Marland Kitchen METOPROLOL SUCCINATE ER 100 MG PO TB24 Oral Take 100 mg by mouth daily.      Marland Kitchen OMEPRAZOLE 20 MG PO CPDR Oral Take 20 mg by mouth daily.     . TRAMADOL HCL 50 MG PO TABS Oral Take 1 tablet (50 mg total) by mouth  every 6 (six) hours as needed for pain. 20 tablet 0    BP 175/52  Pulse 67  Temp 97.3 F (36.3 C) (Oral)  Resp 15  SpO2 99%  Physical Exam  Nursing note and vitals reviewed. Constitutional: He is oriented to person, place, and time. He appears well-developed and well-nourished. No distress.  HENT:  Head: Normocephalic and atraumatic.  Mouth/Throat: Oropharynx is clear and moist.       Palpation of maxillary sinuses worsens HA. Mild turbinate swelling   Eyes: EOM are normal. Pupils are equal, round, and reactive to light.  Neck: Normal range of motion. Neck supple.       No meningismus   Cardiovascular: Normal rate and regular rhythm.   Pulmonary/Chest: Effort normal and breath sounds normal. No respiratory distress. He has no wheezes. He has no rales.  Abdominal: Soft. Bowel sounds are normal.  There is no tenderness. There is no rebound and no guarding.  Musculoskeletal: Normal range of motion. He exhibits no edema and no tenderness.  Neurological: He is alert and oriented to person, place, and time.       Cn II-XII intact, sensation intact, 5/5 motor  Skin: Skin is warm and dry. No rash noted. No erythema.  Psychiatric: He has a normal mood and affect. His behavior is normal.    ED Course  Procedures (including critical care time)  Labs Reviewed  BASIC METABOLIC PANEL - Abnormal; Notable for the following:    Glucose, Bld 139 (*)     GFR calc non Af Amer 81 (*)     All other components within normal limits  CBC - Abnormal; Notable for the following:    RBC 4.06 (*)     HCT 38.7 (*)     All other components within normal limits  DIFFERENTIAL - Abnormal; Notable for the following:    Eosinophils Relative 6 (*)     All other components within normal limits  POCT I-STAT TROPONIN I  PROTIME-INR  APTT  POCT I-STAT TROPONIN I  URINALYSIS, ROUTINE W REFLEX MICROSCOPIC   Dg Chest 2 View  02/04/2012  *RADIOLOGY REPORT*  Clinical Data: Chest pain, headache, productive cough, history CABG, hypertension  CHEST - 2 VIEW  Comparison: 08/06/2010  Findings: Normal heart size post CABG. Mediastinal contours and pulmonary vascularity normal. Lungs clear. Minimal chronic peribronchial thickening noted. No pleural effusion or pneumothorax. Minimal end plate spur formation thoracic spine.  IMPRESSION: No acute abnormalities.  Original Report Authenticated By: Lollie Marrow, M.D.   Ct Head Wo Contrast  02/04/2012  *RADIOLOGY REPORT*  Clinical Data: Headaches with dizziness and blurred vision for 2 days.  CT HEAD WITHOUT CONTRAST  Technique:  Contiguous axial images were obtained from the base of the skull through the vertex without contrast.  Comparison: Head CT 05/08/2010.  MRI brain 10/30/2009.  Findings: There is stable mild chronic periventricular white matter disease.  Scattered foci of  low density in the basal ganglia and left thalamus are stable.  There is no evidence of acute cortical based infarct, acute intracranial hemorrhage, mass lesion, brain edema or extra-axial fluid collection.  Increased density of the basilar artery is unchanged.  The visualized paranasal sinuses are clear and the calvarium is intact.  IMPRESSION: Stable chronic small vessel ischemic changes.  No acute or reversible findings identified.  Original Report Authenticated By: Gerrianne Scale, M.D.     1. Atypical chest pain   2. Headache      Date: 02/04/2012  Rate:  60  Rhythm: normal sinus rhythm  QRS Axis: normal  Intervals: normal  ST/T Wave abnormalities: normal  Conduction Disutrbances:none  Narrative Interpretation:   Old EKG Reviewed: unchanged    MDM  Pt with very atypical chest pain for ACS. Neg trop, normal EKG and relatively recent cath that was normal. No chest pain currently. Will repeat trop at 3 hrs.   HA now improved with small dose of pain meds. Pt appears comfortable. CT head with no acute findings. Will finish fluid bolus and if 2nd trop neg f/u with PMD and cardiologist.         Loren Racer, MD 02/04/12 [phone]

## 2012-02-04 NOTE — ED Notes (Addendum)
Pt presents to department for evaluation of midsternal non radiating chest pain and headache. States pain 2/10, intermittent x2 days. Also states "throbbing" headache and shortness of breath. He also states dizziness this morning, but denies fall and syncopal episode. He is conscious alert and oriented x4. Speaking complete sentences. History of CABG.

## 2012-02-22 ENCOUNTER — Ambulatory Visit (INDEPENDENT_AMBULATORY_CARE_PROVIDER_SITE_OTHER): Payer: Medicare Other | Admitting: Cardiology

## 2012-02-22 ENCOUNTER — Encounter: Payer: Self-pay | Admitting: Cardiology

## 2012-02-22 VITALS — BP 170/80 | HR 64 | Ht 68.0 in | Wt 172.8 lb

## 2012-02-22 DIAGNOSIS — I2510 Atherosclerotic heart disease of native coronary artery without angina pectoris: Secondary | ICD-10-CM

## 2012-02-22 DIAGNOSIS — R5383 Other fatigue: Secondary | ICD-10-CM

## 2012-02-22 DIAGNOSIS — E785 Hyperlipidemia, unspecified: Secondary | ICD-10-CM

## 2012-02-22 DIAGNOSIS — I10 Essential (primary) hypertension: Secondary | ICD-10-CM

## 2012-02-22 DIAGNOSIS — R5381 Other malaise: Secondary | ICD-10-CM

## 2012-02-22 NOTE — Assessment & Plan Note (Signed)
Since his symptoms are no different than last year I doubt that this is an ischemic equivalent. He will continue his secondary risk reduction. No further cardiovascular testing is suggested.

## 2012-02-22 NOTE — Assessment & Plan Note (Signed)
I have asked him to get a lipid profile with a goal LDL less than 100 and HDL greater than 40.

## 2012-02-22 NOTE — Assessment & Plan Note (Signed)
It sounds like his blood pressure is quite labile. He would be reluctant to have up titration of his medications will explant the risk of stroke. No change in therapy is planned today.

## 2012-02-22 NOTE — Assessment & Plan Note (Signed)
This is his predominant complaint and I will check a testosterone level and I wrote level.

## 2012-02-22 NOTE — Patient Instructions (Addendum)
The current medical regimen is effective;  continue present plan and medications.  Please have fasting blood work at your primary care doctor's office  (Fasting Lipid, liver, testosterone level and TSH)  Follow up in 1 year with Dr Antoine Poche.  You will receive a letter in the mail 2 months before you are due.  Please call us when you receive this letter to schedule your follow up appointment.

## 2012-02-22 NOTE — Progress Notes (Signed)
HPI The patient presents for one-year followup of his known coronary disease. Last year he did have an abnormal stress test followed by catheterization which demonstrated patent bypass grafts. At that time he was complaining of increased fatigue. He still gets this. Some days are worse than others. He says his back they are more frequent than his good days. He doesn't describe any of the dyspnea that he had prior to bypass. He doesn't describe any chest pressure, neck or arm discomfort. He's not having any palpitations, presyncope syncope. He has no PND or orthopnea. He thinks he sleeps well. Of note his blood pressure is elevated today. He says he doesn't feel well and his blood pressure is low. He says it does fluctuate he says recently systolic was 111 and another time 124.  Allergies  Allergen Reactions  . Neomycin-Bacitracin Zn-Polymyx Other (See Comments)    Hurt and redness    Current Outpatient Prescriptions  Medication Sig Dispense Refill  . acetaminophen (TYLENOL ARTHRITIS PAIN) 650 MG CR tablet Take 650 mg by mouth daily as needed. For pain      . aspirin 81 MG tablet Take 81 mg by mouth daily.        . enalapril (VASOTEC) 20 MG tablet Take 20 mg by mouth 2 (two) times daily.        Marland Kitchen LIPITOR 10 MG tablet Take 10 mg by mouth daily.       . metoprolol (TOPROL-XL) 100 MG 24 hr tablet Take 100 mg by mouth daily.        Marland Kitchen omeprazole (PRILOSEC) 20 MG capsule Take 20 mg by mouth daily.         Past Medical History  Diagnosis Date  . Other and unspecified hyperlipidemia   . Hypertension   . Coronary artery disease     a.  s/p CABG;   b. cath 4/12: EF 55%, 3vCAD, patent L-LAD, patent S-RCA, patent S-CFX (done 2/2 false pos. ETT)  . Schatzki's ring   . HH (hiatus hernia)   . Diverticular disease   . Hemorrhoids   . Stroke   . GI bleed   . Osteoarthritis     Past Surgical History  Procedure Date  . Coronary artery bypass graft 06/21/2007    CABG x 3 Surgeon Kerin Perna,  MD  . Hip replace 06/09/2004    left hip Surgeon Madlyn Frankel. Charlann Boxer, MD  . Arteriovenous graft placement w/ endoscopic vein harvest     of the right leg greater spahenous vein. Surgeon: Kathlee Nations Trigt,M.D.  . Colonoscopy 02/24/2010    Hemorrhoids, Diverticulosis. Performed at [facility] hospital. Normal terminal ileum. Dr. Jovita Gamma, [facility]    ROS:  Hip pain.  Otherwise as stated in the HPI and negative for all other systems.  PHYSICAL EXAM BP 170/80  Pulse 64  Ht 5\' 8"  (1.727 m)  Wt 172 lb 12.8 oz (78.382 kg)  BMI 26.27 kg/m2 GENERAL:  Well appearing HEENT:  Pupils equal round and reactive, fundi not visualized, oral mucosa unremarkable, poor dentition NECK:  No jugular venous distention, waveform within normal limits, carotid upstroke brisk and symmetric, no bruits, no thyromegaly LYMPHATICS:  No cervical, inguinal adenopathy LUNGS:  Clear to auscultation bilaterally BACK:  No CVA tenderness CHEST:  Well healed sternotomy scar. HEART:  PMI not displaced or sustained,S1 and S2 within normal limits, no S3, no S4, no clicks, no rubs, no murmurs ABD:  Flat, positive bowel sounds normal in frequency in pitch, no bruits, no  rebound, no guarding, no midline pulsatile mass, no hepatomegaly, no splenomegaly EXT:  2 plus pulses throughout, no edema, no cyanosis no clubbing SKIN:  No rashes no nodules NEURO:  Cranial nerves II through XII grossly intact, motor grossly intact throughout PSYCH:  Cognitively intact, oriented to person place and time  EKG:  ASSESSMENT AND PLAN

## 2012-03-03 NOTE — Addendum Note (Signed)
Addended by: Lacie Scotts on: 03/03/2012 02:49 PM   Modules accepted: Orders

## 2013-01-10 DIAGNOSIS — M1990 Unspecified osteoarthritis, unspecified site: Secondary | ICD-10-CM | POA: Insufficient documentation

## 2013-02-18 DIAGNOSIS — N200 Calculus of kidney: Secondary | ICD-10-CM | POA: Insufficient documentation

## 2013-02-18 DIAGNOSIS — E785 Hyperlipidemia, unspecified: Secondary | ICD-10-CM | POA: Insufficient documentation

## 2013-02-18 DIAGNOSIS — M1990 Unspecified osteoarthritis, unspecified site: Secondary | ICD-10-CM | POA: Insufficient documentation

## 2013-02-18 DIAGNOSIS — I639 Cerebral infarction, unspecified: Secondary | ICD-10-CM | POA: Insufficient documentation

## 2013-02-18 DIAGNOSIS — K219 Gastro-esophageal reflux disease without esophagitis: Secondary | ICD-10-CM | POA: Insufficient documentation

## 2013-02-18 DIAGNOSIS — D649 Anemia, unspecified: Secondary | ICD-10-CM | POA: Insufficient documentation

## 2013-02-18 DIAGNOSIS — K222 Esophageal obstruction: Secondary | ICD-10-CM | POA: Insufficient documentation

## 2013-02-18 DIAGNOSIS — E538 Deficiency of other specified B group vitamins: Secondary | ICD-10-CM | POA: Insufficient documentation

## 2013-02-19 DIAGNOSIS — Z96649 Presence of unspecified artificial hip joint: Secondary | ICD-10-CM | POA: Insufficient documentation

## 2013-02-19 DIAGNOSIS — I498 Other specified cardiac arrhythmias: Secondary | ICD-10-CM | POA: Insufficient documentation

## 2013-02-19 DIAGNOSIS — E119 Type 2 diabetes mellitus without complications: Secondary | ICD-10-CM | POA: Insufficient documentation

## 2013-02-19 DIAGNOSIS — Z951 Presence of aortocoronary bypass graft: Secondary | ICD-10-CM | POA: Insufficient documentation

## 2013-02-19 DIAGNOSIS — I519 Heart disease, unspecified: Secondary | ICD-10-CM | POA: Insufficient documentation

## 2013-02-19 DIAGNOSIS — I6789 Other cerebrovascular disease: Secondary | ICD-10-CM | POA: Insufficient documentation

## 2013-02-19 DIAGNOSIS — Z8673 Personal history of transient ischemic attack (TIA), and cerebral infarction without residual deficits: Secondary | ICD-10-CM | POA: Insufficient documentation

## 2013-02-19 DIAGNOSIS — I252 Old myocardial infarction: Secondary | ICD-10-CM | POA: Insufficient documentation

## 2013-03-22 DIAGNOSIS — H9201 Otalgia, right ear: Secondary | ICD-10-CM | POA: Insufficient documentation

## 2013-03-22 DIAGNOSIS — B029 Zoster without complications: Secondary | ICD-10-CM | POA: Insufficient documentation

## 2013-06-03 ENCOUNTER — Inpatient Hospital Stay (HOSPITAL_COMMUNITY)
Admission: EM | Admit: 2013-06-03 | Discharge: 2013-06-05 | DRG: 378 | Disposition: A | Discharge status: Home / Self Care | Payer: Medicare Other | Source: Other Acute Inpatient Hospital | Attending: Internal Medicine | Admitting: Internal Medicine

## 2013-06-03 ENCOUNTER — Observation Stay (HOSPITAL_COMMUNITY)
Admission: AD | Admit: 2013-06-03 | Discharge: 2013-06-03 | Disposition: A | Discharge status: Home / Self Care | Payer: Medicare Other | Source: Other Acute Inpatient Hospital | Attending: Internal Medicine | Admitting: Internal Medicine

## 2013-06-03 ENCOUNTER — Telehealth (HOSPITAL_COMMUNITY): Payer: Self-pay | Admitting: *Deleted

## 2013-06-03 ENCOUNTER — Other Ambulatory Visit: Payer: Self-pay | Admitting: Internal Medicine

## 2013-06-03 DIAGNOSIS — R5381 Other malaise: Secondary | ICD-10-CM

## 2013-06-03 DIAGNOSIS — R05 Cough: Secondary | ICD-10-CM

## 2013-06-03 DIAGNOSIS — Z833 Family history of diabetes mellitus: Secondary | ICD-10-CM

## 2013-06-03 DIAGNOSIS — I10 Essential (primary) hypertension: Secondary | ICD-10-CM

## 2013-06-03 DIAGNOSIS — R059 Cough, unspecified: Secondary | ICD-10-CM

## 2013-06-03 DIAGNOSIS — Z8249 Family history of ischemic heart disease and other diseases of the circulatory system: Secondary | ICD-10-CM

## 2013-06-03 DIAGNOSIS — K219 Gastro-esophageal reflux disease without esophagitis: Secondary | ICD-10-CM | POA: Diagnosis present

## 2013-06-03 DIAGNOSIS — R1013 Epigastric pain: Secondary | ICD-10-CM

## 2013-06-03 DIAGNOSIS — R1314 Dysphagia, pharyngoesophageal phase: Secondary | ICD-10-CM

## 2013-06-03 DIAGNOSIS — G8929 Other chronic pain: Secondary | ICD-10-CM

## 2013-06-03 DIAGNOSIS — R079 Chest pain, unspecified: Secondary | ICD-10-CM

## 2013-06-03 DIAGNOSIS — A048 Other specified bacterial intestinal infections: Secondary | ICD-10-CM | POA: Diagnosis present

## 2013-06-03 DIAGNOSIS — E869 Volume depletion, unspecified: Secondary | ICD-10-CM | POA: Insufficient documentation

## 2013-06-03 DIAGNOSIS — E785 Hyperlipidemia, unspecified: Secondary | ICD-10-CM

## 2013-06-03 DIAGNOSIS — K922 Gastrointestinal hemorrhage, unspecified: Secondary | ICD-10-CM

## 2013-06-03 DIAGNOSIS — J4489 Other specified chronic obstructive pulmonary disease: Secondary | ICD-10-CM | POA: Diagnosis present

## 2013-06-03 DIAGNOSIS — R1310 Dysphagia, unspecified: Secondary | ICD-10-CM

## 2013-06-03 DIAGNOSIS — M1990 Unspecified osteoarthritis, unspecified site: Secondary | ICD-10-CM

## 2013-06-03 DIAGNOSIS — I2510 Atherosclerotic heart disease of native coronary artery without angina pectoris: Secondary | ICD-10-CM

## 2013-06-03 DIAGNOSIS — J449 Chronic obstructive pulmonary disease, unspecified: Secondary | ICD-10-CM | POA: Diagnosis present

## 2013-06-03 DIAGNOSIS — F17200 Nicotine dependence, unspecified, uncomplicated: Secondary | ICD-10-CM | POA: Diagnosis present

## 2013-06-03 DIAGNOSIS — K5731 Diverticulosis of large intestine without perforation or abscess with bleeding: Principal | ICD-10-CM | POA: Diagnosis present

## 2013-06-03 DIAGNOSIS — R0602 Shortness of breath: Secondary | ICD-10-CM

## 2013-06-03 DIAGNOSIS — K921 Melena: Secondary | ICD-10-CM | POA: Insufficient documentation

## 2013-06-03 DIAGNOSIS — R1319 Other dysphagia: Secondary | ICD-10-CM

## 2013-06-03 DIAGNOSIS — D62 Acute posthemorrhagic anemia: Secondary | ICD-10-CM

## 2013-06-03 DIAGNOSIS — R42 Dizziness and giddiness: Secondary | ICD-10-CM

## 2013-06-03 LAB — URINALYSIS, ROUTINE W REFLEX MICROSCOPIC
Bilirubin Urine: NEGATIVE
Glucose, UA: NEGATIVE mg/dL
Hgb urine dipstick: NEGATIVE
Ketones, ur: NEGATIVE mg/dL
Leukocytes, UA: NEGATIVE
Nitrite: NEGATIVE
Protein, ur: NEGATIVE mg/dL
Specific Gravity, Urine: 1.008 (ref 1.005–1.030)
Urobilinogen, UA: 0.2 mg/dL (ref 0.0–1.0)
pH: 5.5 (ref 5.0–8.0)

## 2013-06-03 LAB — COMPREHENSIVE METABOLIC PANEL
ALT: 8 U/L (ref 0–53)
AST: 17 U/L (ref 0–37)
BUN: 15 mg/dL (ref 6–23)
CO2: 26 mEq/L (ref 19–32)
Calcium: 8.2 mg/dL — ABNORMAL LOW (ref 8.4–10.5)
Creatinine, Ser: 0.78 mg/dL (ref 0.50–1.35)
GFR calc Af Amer: >90 mL/min (ref >90)
GFR calc non Af Amer: >90 mL/min (ref >90)
Glucose, Bld: 121 mg/dL — ABNORMAL HIGH (ref 70–99)
Sodium: 140 mEq/L (ref 135–145)
Total Protein: 5.9 g/dL — ABNORMAL LOW (ref 6.0–8.3)

## 2013-06-03 LAB — CBC WITH DIFFERENTIAL/PLATELET
Basophils Absolute: 0 K/uL (ref 0.0–0.1)
Basophils Relative: 0 % (ref 0–1)
Eosinophils Absolute: 0.3 K/uL (ref 0.0–0.7)
Eosinophils Relative: 4 % (ref 0–5)
HCT: 32.6 % — ABNORMAL LOW (ref 39.0–52.0)
Hemoglobin: 11.2 g/dL — ABNORMAL LOW (ref 13.0–17.0)
Lymphocytes Relative: 25 % (ref 12–46)
Lymphs Abs: 1.5 K/uL (ref 0.7–4.0)
MCH: 32.1 pg (ref 26.0–34.0)
MCHC: 34.4 g/dL (ref 30.0–36.0)
MCV: 93.4 fL (ref 78.0–100.0)
Monocytes Absolute: 0.5 K/uL (ref 0.1–1.0)
Monocytes Relative: 9 % (ref 3–12)
Neutro Abs: 3.8 K/uL (ref 1.7–7.7)
Neutrophils Relative %: 62 % (ref 43–77)
Platelets: 249 K/uL (ref 150–400)
RBC: 3.49 MIL/uL — ABNORMAL LOW (ref 4.22–5.81)
RDW: 12.7 % (ref 11.5–15.5)
WBC: 6.2 K/uL (ref 4.0–10.5)

## 2013-06-03 LAB — PROTIME-INR
INR: 1.02 (ref 0.00–1.49)
Prothrombin Time: 13.2 seconds (ref 11.6–15.2)

## 2013-06-03 MED ORDER — LORAZEPAM 0.5 MG PO TABS: 0.5000 mg | ORAL_TABLET | Freq: Two times a day (BID) | ORAL | Status: DC | PRN

## 2013-06-03 MED ORDER — SODIUM CHLORIDE 0.9 % IJ SOLN: 3.0000 mL | Freq: Two times a day (BID) | INTRAMUSCULAR | Status: DC

## 2013-06-03 MED ORDER — ATENOLOL 50 MG PO TABS
50.0000 mg | ORAL_TABLET | Freq: Every day | ORAL | Status: DC
  Administered 2013-06-03: 50 mg via ORAL
  Filled 2013-06-03: qty 1

## 2013-06-03 MED ORDER — MORPHINE SULFATE 2 MG/ML IJ SOLN: 1.0000 mg | INTRAMUSCULAR | Status: DC | PRN

## 2013-06-03 MED ORDER — PANTOPRAZOLE SODIUM 40 MG IV SOLR
40.0000 mg | Freq: Two times a day (BID) | INTRAVENOUS | Status: DC
  Administered 2013-06-03: 40 mg via INTRAVENOUS
  Filled 2013-06-03 (×2): qty 40

## 2013-06-03 MED ORDER — ONDANSETRON HCL 4 MG PO TABS: 4.0000 mg | ORAL_TABLET | Freq: Four times a day (QID) | ORAL | Status: DC | PRN

## 2013-06-03 MED ORDER — OXYCODONE HCL 5 MG PO TABS
5.0000 mg | ORAL_TABLET | ORAL | Status: DC | PRN
  Administered 2013-06-04: 5 mg via ORAL
  Filled 2013-06-03: qty 1

## 2013-06-03 MED ORDER — TIOTROPIUM BROMIDE MONOHYDRATE 18 MCG IN CAPS
18.0000 ug | ORAL_CAPSULE | Freq: Every day | RESPIRATORY_TRACT | Status: DC
  Filled 2013-06-03: qty 5

## 2013-06-03 MED ORDER — SODIUM CHLORIDE 0.9 % IV SOLN
INTRAVENOUS | Status: DC
  Administered 2013-06-03: 16:00:00 via INTRAVENOUS

## 2013-06-03 MED ORDER — IPRATROPIUM BROMIDE 0.02 % IN SOLN: 0.5000 mg | RESPIRATORY_TRACT | Status: DC

## 2013-06-03 MED ORDER — ONDANSETRON HCL 4 MG/2ML IJ SOLN: 4.0000 mg | Freq: Four times a day (QID) | INTRAMUSCULAR | Status: DC | PRN

## 2013-06-03 MED ORDER — ALBUTEROL SULFATE (5 MG/ML) 0.5% IN NEBU: 2.5000 mg | INHALATION_SOLUTION | Freq: Four times a day (QID) | RESPIRATORY_TRACT | Status: DC | PRN

## 2013-06-03 MED ORDER — IPRATROPIUM BROMIDE 0.02 % IN SOLN: 0.5000 mg | Freq: Four times a day (QID) | RESPIRATORY_TRACT | Status: DC | PRN

## 2013-06-03 MED ORDER — AMLODIPINE BESYLATE 10 MG PO TABS
10.0000 mg | ORAL_TABLET | Freq: Every day | ORAL | Status: DC
  Administered 2013-06-03: 10 mg via ORAL
  Filled 2013-06-03: qty 1

## 2013-06-03 MED ORDER — OXYCODONE HCL 5 MG PO TABS: 5.0000 mg | ORAL_TABLET | ORAL | Status: DC | PRN

## 2013-06-03 MED ORDER — ALUM & MAG HYDROXIDE-SIMETH 200-200-20 MG/5ML PO SUSP: 30.0000 mL | Freq: Four times a day (QID) | ORAL | Status: DC | PRN

## 2013-06-03 MED ORDER — ACETAMINOPHEN 325 MG PO TABS: 650.0000 mg | ORAL_TABLET | Freq: Four times a day (QID) | ORAL | Status: DC | PRN

## 2013-06-03 MED ORDER — LABETALOL HCL 5 MG/ML IV SOLN: 10.0000 mg | INTRAVENOUS | Status: DC | PRN

## 2013-06-03 MED ORDER — AMLODIPINE BESYLATE 10 MG PO TABS
10.0000 mg | ORAL_TABLET | Freq: Every day | ORAL | Status: DC
  Administered 2013-06-04 – 2013-06-05 (×2): 10 mg via ORAL
  Filled 2013-06-03 (×2): qty 1

## 2013-06-03 MED ORDER — ALBUTEROL SULFATE (5 MG/ML) 0.5% IN NEBU: 2.5000 mg | INHALATION_SOLUTION | RESPIRATORY_TRACT | Status: DC | PRN

## 2013-06-03 MED ORDER — SODIUM CHLORIDE 0.9 % IV SOLN
INTRAVENOUS | Status: DC
  Administered 2013-06-03 – 2013-06-05 (×3): via INTRAVENOUS

## 2013-06-03 MED ORDER — ATORVASTATIN CALCIUM 40 MG PO TABS
40.0000 mg | ORAL_TABLET | Freq: Every day | ORAL | Status: DC
  Filled 2013-06-03: qty 1

## 2013-06-03 MED ORDER — SODIUM CHLORIDE 0.9 % IJ SOLN
3.0000 mL | Freq: Two times a day (BID) | INTRAMUSCULAR | Status: DC
  Administered 2013-06-03: 3 mL via INTRAVENOUS

## 2013-06-03 MED ORDER — ONDANSETRON HCL 4 MG/2ML IJ SOLN
4.0000 mg | Freq: Four times a day (QID) | INTRAMUSCULAR | Status: DC | PRN
Start: 2013-06-03 — End: 2013-06-05

## 2013-06-03 MED ORDER — ATENOLOL 50 MG PO TABS
50.0000 mg | ORAL_TABLET | Freq: Every day | ORAL | Status: DC
  Administered 2013-06-04 – 2013-06-05 (×2): 50 mg via ORAL
  Filled 2013-06-03 (×2): qty 1

## 2013-06-03 MED ORDER — ACETAMINOPHEN 650 MG RE SUPP: 650.0000 mg | Freq: Four times a day (QID) | RECTAL | Status: DC | PRN

## 2013-06-03 MED ORDER — ACETAMINOPHEN 325 MG PO TABS
650.0000 mg | ORAL_TABLET | Freq: Four times a day (QID) | ORAL | Status: DC | PRN
  Administered 2013-06-04: 325 mg via ORAL
  Filled 2013-06-03: qty 2

## 2013-06-03 MED ORDER — INFLUENZA VAC SPLIT QUAD 0.5 ML IM SUSP: 0.5000 mL | INTRAMUSCULAR | Status: DC

## 2013-06-03 MED ORDER — PANTOPRAZOLE SODIUM 40 MG IV SOLR
40.0000 mg | Freq: Two times a day (BID) | INTRAVENOUS | Status: DC
  Administered 2013-06-03 – 2013-06-04 (×2): 40 mg via INTRAVENOUS
  Filled 2013-06-03 (×4): qty 40

## 2013-06-03 NOTE — Discharge Summary (Deleted)
[facility] History and Physical  Blake Burgess WUJ:[id number] DOB: 03/10/1950 DOA: 06/03/2013  Referring physician:  PCP: Alice Reichert, MD  Specialists: Gastroenterology  Chief Complaint: GI bleed  HPI: Blake Burgess is a 79 y.o. male with a past medical history of coronary artery disease, presently on aspirin therapy, who underwent EGD on 01/10/2013 for workup of dysphagia. Procedure revealed H. pylori gastritis, normal-appearing esophagus, dilated to 56 [place]. Procedure performed by Dr. Cindie Laroche of a [facility] gastroenterology. He reports feeling ill last Friday, having weakness, dizziness and lightheadedness, particularly going from sitting to standing. He stated overall just not feeling well through the weekend then this morning starting at approximately 5:30, had 3 episodes of maroon-colored stools. He presented to the emergency department at [facility] where he had 2 more episodes of  bloody bowel movements, characterized as maroon colored that was witnessed by emergency room staff. Patient denies nausea vomiting or hematemesis. He does report having some lower abdominal discomfort which resolved by the time he arrived at [facility]. Lab work at [place] revealed a hemoglobin of 12.4 with hematocrit of 36.4. His BUN was 22 and had an INR of 0.95. He was found to be hypertensive with systolic blood pressures in the 170s. Patient was transferred to [facility] for a GI evaluation.                                     Review of Systems: The patient denies anorexia, fever, weight loss, vision loss, decreased hearing, hoarseness, chest pain, syncope, dyspnea on exertion, peripheral edema, balance deficits, hemoptysis, severe indigestion/heartburn, hematuria, incontinence, genital sores, suspicious skin lesions, transient blindness, difficulty walking, depression, unusual weight change, enlarged lymph nodes, angioedema, and breast masses.    Past Medical History  Diagnosis  Date  . Hypertension   . Hyperlipidemia   . Anxiety   . Gastroesophageal reflux disease     possible small Schatzki's ring; esophageal dilatation in 2005  . COPD (chronic obstructive pulmonary disease)     Chronic bronchitis; 100-pack-year cigarette consumption  . Chest pain 2005    noncritical coronary disease in 2005: 40% distal RCA, 30% CX, 50% OM 2, EF-60%,  . Overweight(278.02)   . Tobacco abuse     100 pack years  . H. pylori infection 2014    treated with prevpac   Past Surgical History  Procedure Laterality Date  . Cervical spine surgery      C5-6 and C6-7: Relief of spinal stenosis with corpectomy, foraminotomy and fusion  . Orif ankle fracture      right  . Appendectomy    . Tonsillectomy    . Esophagogastroduodenoscopy  07/21/2004    RMR:   A couple of tiny, distal esophageal erosions, otherwise normal esophagus aside from a Schatzki ring status post Maloney dilation as described above/ Small hiatal hernia, otherwise normal stomach, normal D1-D2  . Colonoscopy  08/12/2004    RMR:   Friable anal canal, likely source of patient's hematochezia/  Otherwise normal rectum, normal colon.  . Orif tibia fracture      Left  . Esophagogastroduodenoscopy (egd) with esophageal dilation N/A 01/10/2013    NFA:OZHYQM esophagus s/p dilation/Abnormal stomach of uncertain clinical significance-s/p bx positive for H. pylori, status post Prevpac treatment   Social History:  reports that he has been smoking Cigarettes.  He has a 100 pack-year smoking history. He does not have any smokeless tobacco  history on file. He reports that he drinks about 1.2 ounces of alcohol per week. He reports that he does not use illicit drugs. He is currently married and lives in [place] with his wife.  Allergies  Allergen Reactions  . Tomato Rash    Family History  Problem Relation Age of Onset  . Heart attack Mother     deceased, age 68s  . Ulcers Brother   . Diabetes Brother   . Colon cancer Neg  Hx   . Hypertension       Prior to Admission medications   Medication Sig Start Date End Date Taking? Authorizing Provider  albuterol (PROVENTIL HFA;VENTOLIN HFA) 108 (90 BASE) MCG/ACT inhaler Inhale 2 puffs into the lungs every 6 (six) hours as needed. Shortness of Breath    Historical Provider, MD  amLODipine (NORVASC) 5 MG tablet Take 1 tablet (5 mg total) by mouth daily. 05/25/13   Jodelle Gross, NP  aspirin EC 81 MG tablet Take 81 mg by mouth daily.    Historical Provider, MD  atenolol (TENORMIN) 50 MG tablet Take 50 mg by mouth every morning.     Historical Provider, MD  atorvastatin (LIPITOR) 20 MG tablet Take 20 mg by mouth daily.  12/15/12   Historical Provider, MD  atorvastatin (LIPITOR) 40 MG tablet Take 40 mg by mouth daily.    Historical Provider, MD  LORazepam (ATIVAN) 1 MG tablet Take 1 mg by mouth every 4 (four) hours as needed. For nerves    Historical Provider, MD  nitroGLYCERIN (NITROSTAT) 0.4 MG SL tablet Place 0.4 mg under the tongue every 5 (five) minutes as needed. Chest Pain    Historical Provider, MD  oxyCODONE-acetaminophen (PERCOCET/ROXICET) 5-325 MG per tablet Take 1 tablet by mouth every 4 (four) hours as needed for pain. 04/18/13   Joya Gaskins, MD  pantoprazole (PROTONIX) 40 MG tablet Take 1 tablet (40 mg total) by mouth daily before breakfast. 02/28/13   Tiffany Kocher, PA-C  polyethylene glycol powder (GLYCOLAX/MIRALAX) powder Take 17 g by mouth daily. 02/28/13   Tiffany Kocher, PA-C  tiotropium (SPIRIVA) 18 MCG inhalation capsule Place 18 mcg into inhaler and inhale every morning.    Historical Provider, MD   Physical Exam: Filed Vitals:   06/03/13 1303  BP: 174/63  Pulse: 79  Temp: 98.4 F (36.9 C)  Resp: 11     General:  Patient is in no acute distress, chronically ill appearing. Awake alert and oriented  Eyes: Pupils are equal round reactive to light extraocular movement is intact  Neck: Neck is supple symmetrical no jugular venous distention  or carotid bruits  Cardiovascular: Regular rate and rhythm normal S1-S2 no murmurs rubs or gallops  Respiratory: Clear to auscultation bilaterally no wheezing rhonchi or rales normal story effort. Breath sounds are diminished bilaterally.  Abdomen:  Overall soft, mild tenderness to palpation over the lower abdominal region otherwise no palpable masses, guarding, rebound tenderness  Skin:  No skin lesions rashes noted  Musculoskeletal:  Present range of motion to all extremities, no extremity edema  Psychiatric:  Patient is awake alert and oriented x3  Neurologic:  Cranial nerves II through XII are grossly intact no alteration to sensation global 5 of 5 muscle strength  Labs on Admission:  Basic Metabolic Panel: No results found for this basename: NA, K, CL, CO2, GLUCOSE, BUN, CREATININE, CALCIUM, MG, PHOS,  in the last 168 hours Liver Function Tests: No results found for this basename: AST, ALT, ALKPHOS,  BILITOT, PROT, ALBUMIN,  in the last 168 hours No results found for this basename: LIPASE, AMYLASE,  in the last 168 hours No results found for this basename: AMMONIA,  in the last 168 hours CBC: No results found for this basename: WBC, NEUTROABS, HGB, HCT, MCV, PLT,  in the last 168 hours Cardiac Enzymes: No results found for this basename: CKTOTAL, CKMB, CKMBINDEX, TROPONINI,  in the last 168 hours  BNP (last 3 results) No results found for this basename: PROBNP,  in the last 8760 hours CBG: No results found for this basename: GLUCAP,  in the last 168 hours  Radiological Exams on Admission: No results found.    Assessment/Plan Active Problems:   Hypertension   Hyperlipidemia   Gastroesophageal reflux disease   COPD (chronic obstructive pulmonary disease)   Lower GI bleed   1. Probable lower GI bleed. Possibilities include diverticular bleed versus AVM. He reportedly had approximately 5 episodes of bloody stools this morning characterized as maroon colored. He is  hemodynamically stable, with lab work obtained at [facility] showing a hemoglobin of 12.4 with hematocrit of 36.4. I have consulted gastroenterology, spoken with Dr.Ganem. Will type and screen patient, provide Protonix IV twice a day, IV fluid hydration, recheck a CBC, discontinue antiplatelet therapy for now, awaiting further recommendations from gastroenterology.  2. Volume depletion. Patient reporting dizziness and lightheadedness, particularly going from sitting to standing. He presents with complaints of GI bleed. Initial hemoglobin 12.4. Will provide IV fluid resuscitation with normal saline at 100 mL per hour. Obtain orthostatics in a.m. 3. Chronic obstructive pulmonary disease. Stable. Continue with when necessary duo nebs. 4. Hypertension. Patient not take his a.m. antihypertensive agents, and will restart atenolol and amlodipine, provide when necessary labetalol for systolic blood pressures greater than 165.  5. Dyslipidemia. Continue statin therapy 6. History of coronary artery disease. Patient presently chest pain-free. Will discontinue antiplatelet therapy for now due to the presence of GI bleed. Will continue statin and beta blocker.  7. Gastroesophageal reflux disease. PPI therapy 8. DVT prophylaxis. Bilateral extremity SCDs     Code Status: Full Code  Family Communication: Plan discussed with patient at bedside  Disposition Plan: I do not anticipate patient will require greater than 2 night hospitalization, will place patient in obs  Time spent: 65 minutes  Jeralyn Bennett [facility] Pager [phone]  If 7PM-7AM, please contact night-coverage [email or web address] Password [email or web address] 06/03/2013, 2:41 PM

## 2013-06-03 NOTE — H&P (Addendum)
Jeralyn Bennett, MD Physician Signed  Discharge Summaries Service date: 06/03/2013 2:41 PM   [facility]  History and Physical  Blake Burgess:[id number] DOB: 03/10/1950 DOA: 06/03/2013  Referring physician:  PCP: Alice Reichert, MD  Specialists: Gastroenterology  Chief Complaint: GI bleed   HPI: Blake REIERSON is a 79 y.o. male with a past medical history of coronary artery disease, presently on aspirin therapy, who underwent EGD on 01/10/2013 for workup of dysphagia. Procedure revealed H. pylori gastritis, normal-appearing esophagus, dilated to 56 [place]. Procedure performed by Dr. Cindie Laroche of a [facility] gastroenterology. He reports feeling ill last Friday, having weakness, dizziness and lightheadedness, particularly going from sitting to standing. He stated overall just not feeling well through the weekend then this morning starting at approximately 5:30, had 3 episodes of maroon-colored stools. He presented to the emergency department at [facility] where he had 2 more episodes of bloody bowel movements, characterized as maroon colored that was witnessed by emergency room staff. Patient denies nausea vomiting or hematemesis. He does report having some lower abdominal discomfort which resolved by the time he arrived at [facility]. Lab work at [facility] revealed a hemoglobin of 12.4 with hematocrit of 36.4. His BUN was 22 and had an INR of 0.95. He was found to be hypertensive with systolic blood pressures in the 170s. Patient was transferred to [facility] for a GI evaluation.   Review of Systems: The patient denies anorexia, fever, weight loss, vision loss, decreased hearing, hoarseness, chest pain, syncope, dyspnea on exertion, peripheral edema, balance deficits, hemoptysis, severe indigestion/heartburn, hematuria, incontinence, genital sores, suspicious skin lesions, transient blindness, difficulty walking, depression, unusual weight change, enlarged lymph nodes, angioedema,  and breast masses.    Past Medical History    Diagnosis  Date    .  Hypertension     .  Hyperlipidemia     .  Anxiety     .  Gastroesophageal reflux disease       possible small Schatzki's ring; esophageal dilatation in 2005    .  COPD (chronic obstructive pulmonary disease)       Chronic bronchitis; 100-pack-year cigarette consumption    .  Chest pain  2005      noncritical coronary disease in 2005: 40% distal RCA, 30% CX, 50% OM 2, EF-60%,    .  Overweight(278.02)     .  Tobacco abuse       100 pack years    .  H. pylori infection  2014      treated with prevpac       Past Surgical History    Procedure  Laterality  Date    .  Cervical spine surgery        C5-6 and C6-7: Relief of spinal stenosis with corpectomy, foraminotomy and fusion    .  Orif ankle fracture        right    .  Appendectomy      .  Tonsillectomy      .  Esophagogastroduodenoscopy   07/21/2004      RMR: A couple of tiny, distal esophageal erosions, otherwise normal esophagus aside from a Schatzki ring status post Maloney dilation as described above/ Small hiatal hernia, otherwise normal stomach, normal D1-D2    .  Colonoscopy   08/12/2004      RMR: Friable anal canal, likely source of patient's hematochezia/ Otherwise normal rectum, normal colon.    .  Orif tibia fracture  Left    .  Esophagogastroduodenoscopy (egd) with esophageal dilation  N/A  01/10/2013      HYQ:MVHQIO esophagus s/p dilation/Abnormal stomach of uncertain clinical significance-s/p bx positive for H. pylori, status post Prevpac treatment     Social History: reports that he has been smoking Cigarettes. He has a 100 pack-year smoking history. He does not have any smokeless tobacco history on file. He reports that he drinks about 1.2 ounces of alcohol per week. He reports that he does not use illicit drugs.  He is currently married and lives in [place] with his wife.    Allergies    Allergen  Reactions    .  Tomato  Rash        Family History    Problem  Relation  Age of Onset    .  Heart attack  Mother       deceased, age 19s    .  Ulcers  Brother     .  Diabetes  Brother     .  Colon cancer  Neg Hx     .  Hypertension         Prior to Admission medications    Medication  Sig  Start Date  End Date  Taking?  Authorizing Provider    albuterol (PROVENTIL HFA;VENTOLIN HFA) 108 (90 BASE) MCG/ACT inhaler  Inhale 2 puffs into the lungs every 6 (six) hours as needed. Shortness of Breath     Historical Provider, MD    amLODipine (NORVASC) 5 MG tablet  Take 1 tablet (5 mg total) by mouth daily.  05/25/13    Jodelle Gross, NP    aspirin EC 81 MG tablet  Take 81 mg by mouth daily.     Historical Provider, MD    atenolol (TENORMIN) 50 MG tablet  Take 50 mg by mouth every morning.     Historical Provider, MD    atorvastatin (LIPITOR) 20 MG tablet  Take 20 mg by mouth daily.  12/15/12    Historical Provider, MD    atorvastatin (LIPITOR) 40 MG tablet  Take 40 mg by mouth daily.     Historical Provider, MD    LORazepam (ATIVAN) 1 MG tablet  Take 1 mg by mouth every 4 (four) hours as needed. For nerves     Historical Provider, MD    nitroGLYCERIN (NITROSTAT) 0.4 MG SL tablet  Place 0.4 mg under the tongue every 5 (five) minutes as needed. Chest Pain     Historical Provider, MD    oxyCODONE-acetaminophen (PERCOCET/ROXICET) 5-325 MG per tablet  Take 1 tablet by mouth every 4 (four) hours as needed for pain.  04/18/13    Joya Gaskins, MD    pantoprazole (PROTONIX) 40 MG tablet  Take 1 tablet (40 mg total) by mouth daily before breakfast.  02/28/13    Tiffany Kocher, PA-C    polyethylene glycol powder (GLYCOLAX/MIRALAX) powder  Take 17 g by mouth daily.  02/28/13    Tiffany Kocher, PA-C    tiotropium (SPIRIVA) 18 MCG inhalation capsule  Place 18 mcg into inhaler and inhale every morning.     Historical Provider, MD    Physical Exam:    Filed Vitals:     06/03/13 1303    BP:  174/63    Pulse:  79    Temp:  98.4 F (36.9 C)     Resp:  11     General: Patient is in no acute distress, chronically  ill appearing. Awake alert and oriented  Eyes: Pupils are equal round reactive to light extraocular movement is intact  Neck: Neck is supple symmetrical no jugular venous distention or carotid bruits  Cardiovascular: Regular rate and rhythm normal S1-S2 no murmurs rubs or gallops  Respiratory: Clear to auscultation bilaterally no wheezing rhonchi or rales normal story effort. Breath sounds are diminished bilaterally.  Abdomen: Overall soft, mild tenderness to palpation over the lower abdominal region otherwise no palpable masses, guarding, rebound tenderness  Skin: No skin lesions rashes noted  Musculoskeletal: Present range of motion to all extremities, no extremity edema  Psychiatric: Patient is awake alert and oriented x3  Neurologic: Cranial nerves II through XII are grossly intact no alteration to sensation global 5 of 5 muscle strength Labs on Admission:  Basic Metabolic Panel:  No results found for this basename: NA, K, CL, CO2, GLUCOSE, BUN, CREATININE, CALCIUM, MG, PHOS, in the last 168 hours  Liver Function Tests:  No results found for this basename: AST, ALT, ALKPHOS, BILITOT, PROT, ALBUMIN, in the last 168 hours  No results found for this basename: LIPASE, AMYLASE, in the last 168 hours  No results found for this basename: AMMONIA, in the last 168 hours  CBC:  No results found for this basename: WBC, NEUTROABS, HGB, HCT, MCV, PLT, in the last 168 hours  Cardiac Enzymes:  No results found for this basename: CKTOTAL, CKMB, CKMBINDEX, TROPONINI, in the last 168 hours  BNP (last 3 results)  No results found for this basename: PROBNP, in the last 8760 hours  CBG:  No results found for this basename: GLUCAP, in the last 168 hours  Radiological Exams on Admission:  No results found.  Assessment/Plan  Active Problems:  Hypertension  Hyperlipidemia  Gastroesophageal reflux disease  COPD (chronic obstructive  pulmonary disease)  Lower GI bleed   1. Probable lower GI bleed. Possibilities include diverticular bleed versus AVM. He reportedly had approximately 5 episodes of bloody stools this morning characterized as maroon colored. He is hemodynamically stable, with lab work obtained at [facility] showing a hemoglobin of 12.4 with hematocrit of 36.4. I have consulted gastroenterology, spoken with Dr.Ganem. Will type and screen patient, provide Protonix IV twice a day, IV fluid hydration, recheck a CBC, discontinue antiplatelet therapy for now, awaiting further recommendations from gastroenterology.  2. Volume depletion. Patient reporting dizziness and lightheadedness, particularly going from sitting to standing. He presents with complaints of GI bleed. Initial hemoglobin 12.4. Will provide IV fluid resuscitation with normal saline at 100 mL per hour. Obtain orthostatics in a.m. 3. Chronic obstructive pulmonary disease. Stable. Continue with when necessary duo nebs. 4. Hypertension. Patient not take his a.m. antihypertensive agents, and will restart atenolol and amlodipine, provide when necessary labetalol for systolic blood pressures greater than 165.  5. Dyslipidemia. Continue statin therapy 6. History of coronary artery disease. Patient presently chest pain-free. Will discontinue antiplatelet therapy for now due to the presence of GI bleed. Will continue statin and beta blocker.  7. Gastroesophageal reflux disease. PPI therapy 8. DVT prophylaxis. Bilateral extremity SCDs  Code Status: Full Code  Family Communication: Plan discussed with patient at bedside  Disposition Plan: I do not anticipate patient will require greater than 2 night hospitalization, will place patient in obs  Time spent: 65 minutes  Jeralyn Bennett  [facility]  Pager [phone]  If 7PM-7AM, please contact night-coverage  [email or web address]  Password [email or web address]  06/03/2013, 2:41 PM

## 2013-06-03 NOTE — Consult Note (Signed)
Subjective:   HPI  The patient is a 79 year old male who was not feeling well last Friday and was feeling weak dizzy and lightheaded. This morning at about 5:30 he had red and maroon-colored stools with some clots. He went to the emergency room at [place] [facility] with a couple more episodes of bloody stool. He denied vomiting hematemesis or upper abdominal pain. He was having some lower abdominal cramping discomfort. His hemoglobin was 12.4 and hematocrit 36.4 at [place] [facility]. His BUN was 22.  He tells me that this is the third time he has had GI bleeding. He states that the last time this happened he was sent to [facility] where he had both an EGD and colonoscopy. Apparently the bleeding stopped before they found a definite source but that he had diverticulosis of the colon. I do not see any records from the colonoscopy. No history of peptic ulcer. Review of Systems No chest pain or shortness of breath   Past Medical History  Diagnosis Date  . Hypertension   . Hyperlipidemia   . Anxiety   . Gastroesophageal reflux disease     possible small Schatzki's ring; esophageal dilatation in 2005  . COPD (chronic obstructive pulmonary disease)     Chronic bronchitis; 100-pack-year cigarette consumption  . Chest pain 2005    noncritical coronary disease in 2005: 40% distal RCA, 30% CX, 50% OM 2, EF-60%,  . Overweight(278.02)   . Tobacco abuse     100 pack years  . H. pylori infection 2014    treated with prevpac   Past Surgical History  Procedure Laterality Date  . Cervical spine surgery      C5-6 and C6-7: Relief of spinal stenosis with corpectomy, foraminotomy and fusion  . Orif ankle fracture      right  . Appendectomy    . Tonsillectomy    . Esophagogastroduodenoscopy  07/21/2004    RMR:   A couple of tiny, distal esophageal erosions, otherwise normal esophagus aside from a Schatzki ring status post Maloney dilation as described above/ Small hiatal hernia, otherwise normal  stomach, normal D1-D2  . Colonoscopy  08/12/2004    RMR:   Friable anal canal, likely source of patient's hematochezia/  Otherwise normal rectum, normal colon.  . Orif tibia fracture      Left  . Esophagogastroduodenoscopy (egd) with esophageal dilation N/A 01/10/2013    FAO:ZHYQMV esophagus s/p dilation/Abnormal stomach of uncertain clinical significance-s/p bx positive for H. pylori, status post Prevpac treatment   History   Social History  . Marital Status: Married    Spouse Name: N/A    Number of Children: 3  . Years of Education: N/A   Occupational History  . disability    Social History Main Topics  . Smoking status: Current Every Day Smoker -- 2.00 packs/day for 50 years    Types: Cigarettes  . Smokeless tobacco: Not on file     Comment: 1 pack per day as of 06/2012  . Alcohol Use: 1.2 oz/week    2 Cans of beer per week     Comment: occasionally, usually holidays but sometimes on weekends, prior heavy use  . Drug Use: No  . Sexual Activity: Not on file   Other Topics Concern  . Not on file   Social History Narrative  . No narrative on file   family history includes Diabetes in his brother; Heart attack in his mother; Hypertension in an other family member; Ulcers in his brother. There  is no history of Colon cancer. Current facility-administered medications:0.9 %  sodium chloride infusion, , Intravenous, Continuous, Jeralyn Bennett, MD, Last Rate: 100 mL/hr at 06/03/13 1536;  acetaminophen (TYLENOL) suppository 650 mg, 650 mg, Rectal, Q6H PRN, Jeralyn Bennett, MD;  acetaminophen (TYLENOL) tablet 650 mg, 650 mg, Oral, Q6H PRN, Jeralyn Bennett, MD;  albuterol (PROVENTIL) (5 MG/ML) 0.5% nebulizer solution 2.5 mg, 2.5 mg, Nebulization, Q4H PRN, Jeralyn Bennett, MD alum & mag hydroxide-simeth (MAALOX/MYLANTA) 200-200-20 MG/5ML suspension 30 mL, 30 mL, Oral, Q6H PRN, Jeralyn Bennett, MD;  amLODipine (NORVASC) tablet 10 mg, 10 mg, Oral, Daily, Jeralyn Bennett, MD, 10 mg at  06/03/13 1537;  atenolol (TENORMIN) tablet 50 mg, 50 mg, Oral, Daily, Jeralyn Bennett, MD, 50 mg at 06/03/13 1536;  atorvastatin (LIPITOR) tablet 40 mg, 40 mg, Oral, q1800, Jeralyn Bennett, MD ipratropium (ATROVENT) nebulizer solution 0.5 mg, 0.5 mg, Nebulization, Q4H, Jeralyn Bennett, MD;  labetalol (NORMODYNE,TRANDATE) injection 10 mg, 10 mg, Intravenous, Q2H PRN, Jeralyn Bennett, MD;  LORazepam (ATIVAN) tablet 0.5 mg, 0.5 mg, Oral, BID PRN, Jeralyn Bennett, MD;  morphine 2 MG/ML injection 1 mg, 1 mg, Intravenous, Q3H PRN, Jeralyn Bennett, MD;  ondansetron (ZOFRAN) injection 4 mg, 4 mg, Intravenous, Q6H PRN, Jeralyn Bennett, MD ondansetron (ZOFRAN) tablet 4 mg, 4 mg, Oral, Q6H PRN, Jeralyn Bennett, MD;  oxyCODONE (Oxy IR/ROXICODONE) immediate release tablet 5 mg, 5 mg, Oral, Q4H PRN, Jeralyn Bennett, MD;  pantoprazole (PROTONIX) injection 40 mg, 40 mg, Intravenous, Q12H, Jeralyn Bennett, MD, 40 mg at 06/03/13 1539;  sodium chloride 0.9 % injection 3 mL, 3 mL, Intravenous, Q12H, Jeralyn Bennett, MD Allergies  Allergen Reactions  . Tomato Rash     Objective:     BP 174/63  Pulse 79  Temp(Src) 97.7 F (36.5 C) (Oral)  Resp 11  Ht 5\' 9"  (1.753 m)  Wt 76.6 kg (168 lb 14 oz)  BMI 24.93 kg/m2  SpO2 100%   he is in no distress  Nonicteric  Heart regular rhythm  Lungs clear  Abdomen: Bowel sounds present, soft, nontender   Laboratory No components found with this basename: d1      Assessment:     Suspect lower GI bleed       Plan:      continue supportive care. Transfuse blood as needed. If he has further bleeding I would send him for a nuclear medicine GI bleeding scan to see if we can localize the region in the lower GI tract where this is coming from. We will see if we can tract down records from [facility] and there last colonoscopy.  Lab Results  Component Value Date   HGB 11.2* 06/03/2013   HGB 15.7 04/17/2013   HGB 14.9 07/31/2012   HCT 32.6* 06/03/2013   HCT 45.5  04/17/2013   HCT 44 03/10/2013   HCT 43.3 07/31/2012   ALKPHOS 70 08/08/2012   ALKPHOS 68 07/31/2012   ALKPHOS 59 06/28/2011   ALKPHOS 90 04/09/2011   AST 24 08/08/2012   AST 22 07/31/2012   AST 19 06/28/2011   AST 14 04/09/2011   ALT 23 08/08/2012   ALT 22 07/31/2012   ALT 40 06/28/2011

## 2013-06-04 DIAGNOSIS — K922 Gastrointestinal hemorrhage, unspecified: Secondary | ICD-10-CM

## 2013-06-04 DIAGNOSIS — E785 Hyperlipidemia, unspecified: Secondary | ICD-10-CM

## 2013-06-04 DIAGNOSIS — J449 Chronic obstructive pulmonary disease, unspecified: Secondary | ICD-10-CM

## 2013-06-04 DIAGNOSIS — R1314 Dysphagia, pharyngoesophageal phase: Secondary | ICD-10-CM

## 2013-06-04 DIAGNOSIS — R1013 Epigastric pain: Secondary | ICD-10-CM

## 2013-06-04 DIAGNOSIS — I10 Essential (primary) hypertension: Secondary | ICD-10-CM

## 2013-06-04 DIAGNOSIS — G8929 Other chronic pain: Secondary | ICD-10-CM

## 2013-06-04 LAB — BASIC METABOLIC PANEL
BUN: 12 mg/dL (ref 6–23)
Calcium: 8.5 mg/dL (ref 8.4–10.5)
Creatinine, Ser: 0.74 mg/dL (ref 0.50–1.35)
GFR calc Af Amer: >90 mL/min (ref >90)
GFR calc non Af Amer: 85 mL/min — ABNORMAL LOW (ref >90)
Glucose, Bld: 104 mg/dL — ABNORMAL HIGH (ref 70–99)
Potassium: 3.7 mEq/L (ref 3.5–5.1)

## 2013-06-04 LAB — CBC
HCT: 33.2 % — ABNORMAL LOW (ref 39.0–52.0)
Hemoglobin: 11.6 g/dL — ABNORMAL LOW (ref 13.0–17.0)
Hemoglobin: 11.1 g/dL — ABNORMAL LOW (ref 13.0–17.0)
MCH: 32.3 pg (ref 26.0–34.0)
MCH: 31.9 pg (ref 26.0–34.0)
MCHC: 34.8 g/dL (ref 30.0–36.0)
MCHC: 33.4 g/dL (ref 30.0–36.0)
MCV: 95.4 fL (ref 78.0–100.0)
Platelets: 256 10*3/uL (ref 150–400)
RDW: 12.6 % (ref 11.5–15.5)
RDW: 12.9 % (ref 11.5–15.5)

## 2013-06-04 LAB — TYPE AND SCREEN

## 2013-06-04 MED ORDER — PANTOPRAZOLE SODIUM 40 MG PO TBEC
40.0000 mg | DELAYED_RELEASE_TABLET | Freq: Every day | ORAL | Status: DC
  Administered 2013-06-05: 40 mg via ORAL
  Filled 2013-06-04: qty 1

## 2013-06-04 NOTE — Progress Notes (Signed)
[facility] Progress Note  Subjective: Correction on patient's age. He is 79 years old not 12 as previously stated.  He has not had any further bleeding is being here at the hospital. He tells me that he had a colonoscopy a couple of years ago and has been told that he had diverticula in the colon.  Objective: Vital signs in last 24 hours: Temp:  [97.7 F (36.5 C)-98.4 F (36.9 C)] 97.8 F (36.6 C) (10/13 0700) Pulse Rate:  [54-79] 63 (10/13 0700) Resp:  [10-21] 10 (10/13 0700) BP: (121-174)/(45-80) 150/65 mmHg (10/13 0700) SpO2:  [96 %-100 %] 100 % (10/13 0700) Weight:  [76.6 kg (168 lb 14 oz)] 76.6 kg (168 lb 14 oz) (10/12 2100) Weight change:    PE:  He is in no distress  Abdomen soft nontender  Lab Results: Results for orders placed during the hospital encounter of 06/03/13 (from the past 24 hour(s))  BASIC METABOLIC PANEL     Status: Abnormal   Collection Time    06/04/13  4:42 AM      Result Value Range   Sodium 137  135 - 145 mEq/L   Potassium 3.7  3.5 - 5.1 mEq/L   Chloride 105  96 - 112 mEq/L   CO2 24  19 - 32 mEq/L   Glucose, Bld 104 (*) 70 - 99 mg/dL   BUN 12  6 - 23 mg/dL   Creatinine, Ser 1.61  0.50 - 1.35 mg/dL   Calcium 8.5  8.4 - 09.6 mg/dL   GFR calc non Af Amer 85 (*) >90 mL/min   GFR calc Af Amer >90  >90 mL/min  CBC     Status: Abnormal   Collection Time    06/04/13  4:42 AM      Result Value Range   WBC 6.7  4.0 - 10.5 K/uL   RBC 3.59 (*) 4.22 - 5.81 MIL/uL   Hemoglobin 11.6 (*) 13.0 - 17.0 g/dL   HCT 04.5 (*) 40.9 - 81.1 %   MCV 92.8  78.0 - 100.0 fL   MCH 32.3  26.0 - 34.0 pg   MCHC 34.8  30.0 - 36.0 g/dL   RDW 91.4  78.2 - 95.6 %   Platelets 256  150 - 400 K/uL  TYPE AND SCREEN     Status: None   Collection Time    06/04/13  8:15 AM      Result Value Range   ABO/RH(D) A POS     Antibody Screen NEG     Sample Expiration 06/07/2013      Studies/Results: @[id number] @    Assessment: Lower GI bleed most likely  diverticular in origin given the patient's history. This appears to have stopped clinically for the moment.  Plan: Given the improvement in hemoglobin and hematocrit and no signs of active bleeding at this time I would recommend advancing diet and watching him clinically. If he has further bleeding a nuclear medicine GI bleed scan would be the next step if bleeding is active. I do not anticipate repeat colonoscopy.    Graylin Shiver 06/04/2013, 9:44 AM  Lab Results  Component Value Date   HGB 11.6* 06/04/2013   HGB 11.2* 06/03/2013   HGB 13.0 02/04/2012   HCT 33.3* 06/04/2013   HCT 32.6* 06/03/2013   HCT 38.7* 02/04/2012   ALKPHOS 42 06/03/2013   ALKPHOS 41 09/11/2010   ALKPHOS 43 02/05/2009   AST 17 06/03/2013   AST 20 09/11/2010   AST 21 02/05/2009  ALT 8 06/03/2013   ALT 14 09/11/2010   ALT 15 02/05/2009

## 2013-06-04 NOTE — Progress Notes (Signed)
Pt's 02 sat 84 % R/A while sleeping. 02@ 2 LNP initiated 02 sat 98-99% . We will continue to monitor.

## 2013-06-04 NOTE — H&P (Deleted)
Blake Camilli, MD Physician Signed  Discharge Summaries Service date: 06/03/2013 2:41 PM   [facility]  History and Physical  Blake Burgess MRN:[id number] DOB: 03/10/1950 DOA: 06/03/2013  Referring physician:  PCP: MCINNIS,ANGUS G, MD  Specialists: Gastroenterology  Chief Complaint: GI bleed   HPI: Blake Burgess is a 79 y.o. male with a past medical history of coronary artery disease, presently on aspirin therapy, who underwent EGD on 01/10/2013 for workup of dysphagia. Procedure revealed H. pylori gastritis, normal-appearing esophagus, dilated to 56 [place]. Procedure performed by Dr. Robert Rourke of a [facility]. He reports feeling ill last Friday, having weakness, dizziness and lightheadedness, particularly going from sitting to standing. He stated overall just not feeling well through the weekend then this morning starting at approximately 5:30, had 3 episodes of maroon-colored stools. He presented to the emergency department at [facility] where he had 2 more episodes of bloody bowel movements, characterized as maroon colored that was witnessed by emergency room staff. Patient denies nausea vomiting or hematemesis. He does report having some lower abdominal discomfort which resolved by the time he arrived at [facility]. Lab work at [facility] revealed a hemoglobin of 12.4 with hematocrit of 36.4. His BUN was 22 and had an INR of 0.95. He was found to be hypertensive with systolic blood pressures in the 170s. Patient was transferred to [facility] for a GI evaluation.   Review of Systems: The patient denies anorexia, fever, weight loss, vision loss, decreased hearing, hoarseness, chest pain, syncope, dyspnea on exertion, peripheral edema, balance deficits, hemoptysis, severe indigestion/heartburn, hematuria, incontinence, genital sores, suspicious skin lesions, transient blindness, difficulty walking, depression, unusual weight change, enlarged lymph nodes, angioedema,  and breast masses.    Past Medical History    Diagnosis  Date    .  Hypertension     .  Hyperlipidemia     .  Anxiety     .  Gastroesophageal reflux disease       possible small Schatzki's ring; esophageal dilatation in 2005    .  COPD (chronic obstructive pulmonary disease)       Chronic bronchitis; 100-pack-year cigarette consumption    .  Chest pain  2005      noncritical coronary disease in 2005: 40% distal RCA, 30% CX, 50% OM 2, EF-60%,    .  Overweight(278.02)     .  Tobacco abuse       100 pack years    .  H. pylori infection  2014      treated with prevpac       Past Surgical History    Procedure  Laterality  Date    .  Cervical spine surgery        C5-6 and C6-7: Relief of spinal stenosis with corpectomy, foraminotomy and fusion    .  Orif ankle fracture        right    .  Appendectomy      .  Tonsillectomy      .  Esophagogastroduodenoscopy   07/21/2004      RMR: A couple of tiny, distal esophageal erosions, otherwise normal esophagus aside from a Schatzki ring status post Maloney dilation as described above/ Small hiatal hernia, otherwise normal stomach, normal D1-D2    .  Colonoscopy   08/12/2004      RMR: Friable anal canal, likely source of patient's hematochezia/ Otherwise normal rectum, normal colon.    .  Orif tibia fracture          Left    .  Esophagogastroduodenoscopy (egd) with esophageal dilation  N/A  01/10/2013      RMR:Normal esophagus s/p dilation/Abnormal stomach of uncertain clinical significance-s/p bx positive for H. pylori, status post Prevpac treatment     Social History: reports that he has been smoking Cigarettes. He has a 100 pack-year smoking history. He does not have any smokeless tobacco history on file. He reports that he drinks about 1.2 ounces of alcohol per week. He reports that he does not use illicit drugs.  He is currently married and lives in [place] with his wife.    Allergies    Allergen  Reactions    .  Tomato  Rash        Family History    Problem  Relation  Age of Onset    .  Heart attack  Mother       deceased, age 50s    .  Ulcers  Brother     .  Diabetes  Brother     .  Colon cancer  Neg Hx     .  Hypertension         Prior to Admission medications    Medication  Sig  Start Date  End Date  Taking?  Authorizing Provider    albuterol (PROVENTIL HFA;VENTOLIN HFA) 108 (90 BASE) MCG/ACT inhaler  Inhale 2 puffs into the lungs every 6 (six) hours as needed. Shortness of Breath     Historical Provider, MD    amLODipine (NORVASC) 5 MG tablet  Take 1 tablet (5 mg total) by mouth daily.  05/25/13    Kathryn M Lawrence, NP    aspirin EC 81 MG tablet  Take 81 mg by mouth daily.     Historical Provider, MD    atenolol (TENORMIN) 50 MG tablet  Take 50 mg by mouth every morning.     Historical Provider, MD    atorvastatin (LIPITOR) 20 MG tablet  Take 20 mg by mouth daily.  12/15/12    Historical Provider, MD    atorvastatin (LIPITOR) 40 MG tablet  Take 40 mg by mouth daily.     Historical Provider, MD    LORazepam (ATIVAN) 1 MG tablet  Take 1 mg by mouth every 4 (four) hours as needed. For nerves     Historical Provider, MD    nitroGLYCERIN (NITROSTAT) 0.4 MG SL tablet  Place 0.4 mg under the tongue every 5 (five) minutes as needed. Chest Pain     Historical Provider, MD    oxyCODONE-acetaminophen (PERCOCET/ROXICET) 5-325 MG per tablet  Take 1 tablet by mouth every 4 (four) hours as needed for pain.  04/18/13    Donald W Wickline, MD    pantoprazole (PROTONIX) 40 MG tablet  Take 1 tablet (40 mg total) by mouth daily before breakfast.  02/28/13    Leslie S Lewis, PA-C    polyethylene glycol powder (GLYCOLAX/MIRALAX) powder  Take 17 g by mouth daily.  02/28/13    Leslie S Lewis, PA-C    tiotropium (SPIRIVA) 18 MCG inhalation capsule  Place 18 mcg into inhaler and inhale every morning.     Historical Provider, MD    Physical Exam:    Filed Vitals:     06/03/13 1303    BP:  174/63    Pulse:  79    Temp:  98.4 F (36.9 C)     Resp:  11     General: Patient is in no acute distress, chronically   ill appearing. Awake alert and oriented  Eyes: Pupils are equal round reactive to light extraocular movement is intact  Neck: Neck is supple symmetrical no jugular venous distention or carotid bruits  Cardiovascular: Regular rate and rhythm normal S1-S2 no murmurs rubs or gallops  Respiratory: Clear to auscultation bilaterally no wheezing rhonchi or rales normal story effort. Breath sounds are diminished bilaterally.  Abdomen: Overall soft, mild tenderness to palpation over the lower abdominal region otherwise no palpable masses, guarding, rebound tenderness  Skin: No skin lesions rashes noted  Musculoskeletal: Present range of motion to all extremities, no extremity edema  Psychiatric: Patient is awake alert and oriented x3  Neurologic: Cranial nerves II through XII are grossly intact no alteration to sensation global 5 of 5 muscle strength Labs on Admission:  Basic Metabolic Panel:  No results found for this basename: NA, K, CL, CO2, GLUCOSE, BUN, CREATININE, CALCIUM, MG, PHOS, in the last 168 hours  Liver Function Tests:  No results found for this basename: AST, ALT, ALKPHOS, BILITOT, PROT, ALBUMIN, in the last 168 hours  No results found for this basename: LIPASE, AMYLASE, in the last 168 hours  No results found for this basename: AMMONIA, in the last 168 hours  CBC:  No results found for this basename: WBC, NEUTROABS, HGB, HCT, MCV, PLT, in the last 168 hours  Cardiac Enzymes:  No results found for this basename: CKTOTAL, CKMB, CKMBINDEX, TROPONINI, in the last 168 hours  BNP (last 3 results)  No results found for this basename: PROBNP, in the last 8760 hours  CBG:  No results found for this basename: GLUCAP, in the last 168 hours  Radiological Exams on Admission:  No results found.  Assessment/Plan  Active Problems:  Hypertension  Hyperlipidemia  Gastroesophageal reflux disease  COPD (chronic obstructive  pulmonary disease)  Lower GI bleed   1. Probable lower GI bleed. Possibilities include diverticular bleed versus AVM. He reportedly had approximately 5 episodes of bloody stools this morning characterized as maroon colored. He is hemodynamically stable, with lab work obtained at [facility] showing a hemoglobin of 12.4 with hematocrit of 36.4. I have consulted gastroenterology, spoken with Dr.Ganem. Will type and screen patient, provide Protonix IV twice a day, IV fluid hydration, recheck a CBC, discontinue antiplatelet therapy for now, awaiting further recommendations from gastroenterology.  2. Volume depletion. Patient reporting dizziness and lightheadedness, particularly going from sitting to standing. He presents with complaints of GI bleed. Initial hemoglobin 12.4. Will provide IV fluid resuscitation with normal saline at 100 mL per hour. Obtain orthostatics in a.m. 3. Chronic obstructive pulmonary disease. Stable. Continue with when necessary duo nebs. 4. Hypertension. Patient not take his a.m. antihypertensive agents, and will restart atenolol and amlodipine, provide when necessary labetalol for systolic blood pressures greater than 165.  5. Dyslipidemia. Continue statin therapy 6. History of coronary artery disease. Patient presently chest pain-free. Will discontinue antiplatelet therapy for now due to the presence of GI bleed. Will continue statin and beta blocker.  7. Gastroesophageal reflux disease. PPI therapy 8. DVT prophylaxis. Bilateral extremity SCDs  Code Status: Full Code  Family Communication: Plan discussed with patient at bedside  Disposition Plan: I do not anticipate patient will require greater than 2 night hospitalization, will place patient in obs  Time spent: 65 minutes  Neziah Vogelgesang  [facility]  Pager [phone]  If 7PM-7AM, please contact night-coverage  [email or web address]  Password [email or web address]  06/03/2013, 2:41 PM       with patient at bedside  Disposition Plan: I do not anticipate patient will require greater than 2 night hospitalization, will place patient in obs  Time spent: 65 minutes  Vanessa Barbara [facility]  [facility]  Pager [phone]  If 7PM-7AM, please contact night-coverage  [email or web address]  Password [email or web address]   06/03/2013, 2:41 PM

## 2013-06-04 NOTE — Progress Notes (Addendum)
Pt TX to 4N-31, VSS, called report. Per Pt: passed 'normal brown stool'

## 2013-06-04 NOTE — Progress Notes (Signed)
[facility] Progress Note  TEAM 1 - Stepdown/ICU TEAM   Blake Burgess NFA:[id number] DOB: May 29, 1932 DOA: 06/03/2013 PCP: No primary provider on file.  Admit HPI / Brief Narrative: 79 y.o. male with a past medical history of coronary artery disease, presently on aspirin therapy, who underwent EGD on 01/10/2013 for workup of dysphagia. Procedure revealed H. pylori gastritis, normal-appearing esophagus, dilated to 56 [place]. Procedure performed by Dr. Cindie Laroche of a [facility]. He reported feeling ill, having weakness, dizziness and lightheadedness, particularly going from sitting to standing. He stated overall just not feeling well then had 3 episodes of maroon-colored stools. He presented to the emergency department at [facility] where he had 2 more episodes of bloody bowel movements, characterized as maroon colored. Patient denied nausea vomiting or hematemesis. He did report having some lower abdominal discomfort which resolved by the time he arrived at [facility]. Lab work at [facility] revealed a hemoglobin of 12.4 with hematocrit of 36.4. His BUN was 22 and had an INR of 0.95. He was found to be hypertensive with systolic blood pressures in the 170s. Patient was transferred to [facility] for a GI evaluation.   Assessment/Plan:  Lower GI Bleed - suspect diverticular source  No plans for colonoscopy at the present time - GI following  Acute blood loss anemia  Due to GIB - has NOT required transfusion thus far this admit - follow serial CBC  Presyncope / volume depletion  Due to above - begin physical therapy/occupational therapy - blood pressure stable  GERD Continue home treatment regimen  HTN BP reasonably controlled at the present time given current clinical situation - follow without change today  COPD w/ ongoing tobacco abuse Well compensated at the present time  CAD noncritical coronary disease in 2005: 40% distal RCA, 30% CX, 50%  OM 2, EF-60%  HLD Continue home medical therapy  Code Status: FULL Family Communication: no family present at time of exam Disposition Plan: transfer to medical bed - follow serial CBC - PT/OT evaluations  Consultants: GI - [facility]   Procedures: none  Antibiotics: none  DVT prophylaxis: SCDs  HPI/Subjective: The patient is alert and conversant.  He denies having a maroon stool over the last 24 hours.  He denies abdominal pain cramps nausea or vomiting.  He is somewhat hungry.  Objective: Blood pressure 150/65, pulse 63, temperature 97.8 F (36.6 C), temperature source Oral, resp. rate 10, height 5\' 9"  (1.753 m), weight 76.6 kg (168 lb 14 oz), SpO2 100.00%.  Intake/Output Summary (Last 24 hours) at 06/04/13 1141 Last data filed at 06/04/13 0800  Gross per 24 hour  Intake 978.33 ml  Output   2000 ml  Net -1021.67 ml   Exam: General: No acute respiratory distress Lungs: Clear to auscultation bilaterally without wheezes or crackles Cardiovascular: Regular rate and rhythm without murmur gallop or rub normal S1 and S2 Abdomen: Nontender, nondistended, soft, bowel sounds positive, no rebound, no ascites, no appreciable mass Extremities: No significant cyanosis, clubbing, or edema bilateral lower extremities  Data Reviewed: Basic Metabolic Panel:  Recent Labs Lab 06/03/13 1544 06/04/13 0442  NA 140 137  K 4.5 3.7  CL 106 105  CO2 26 24  GLUCOSE 121* 104*  BUN 15 12  CREATININE 0.78 0.74  CALCIUM 8.2* 8.5   Liver Function Tests:  Recent Labs Lab 06/03/13 1544  AST 17  ALT 8  ALKPHOS 42  BILITOT 0.5  PROT 5.9*  ALBUMIN 3.3*   CBC:  Recent  Labs Lab 06/03/13 1544 06/04/13 0442  WBC 6.2 6.7  NEUTROABS 3.8  --   HGB 11.2* 11.6*  HCT 32.6* 33.3*  MCV 93.4 92.8  PLT 249 256    Recent Results (from the past 240 hour(s))  MRSA PCR SCREENING     Status: None   Collection Time    06/03/13  1:46 PM      Result Value Range Status   MRSA by PCR NEGATIVE   NEGATIVE Final   Comment:            The GeneXpert MRSA Assay (FDA     approved for NASAL specimens     only), is one component of a     comprehensive MRSA colonization     surveillance program. It is not     intended to diagnose MRSA     infection nor to guide or     monitor treatment for     MRSA infections.     Studies:  Recent x-ray studies have been reviewed in detail by the Attending Physician  Scheduled Meds:  Scheduled Meds: . amLODipine  10 mg Oral Daily  . atenolol  50 mg Oral Daily  . pantoprazole (PROTONIX) IV  40 mg Intravenous Q12H  . sodium chloride  3 mL Intravenous Q12H    Time spent on care of this patient: 35 mins   [facility]  [facility] Office  [phone] Pager - Text Page per Loretha Stapler as per below:  On-Call/Text Page:      [email or web address]      password [email or web address]  If 7PM-7AM, please contact night-coverage [email or web address] Password [email or web address] 06/04/2013, 11:41 AM   LOS: 1 day

## 2013-06-04 NOTE — H&P (Signed)
Sharron Petruska, MD Physician Signed  Discharge Summaries Service date: 06/03/2013 2:41 PM   [facility]  History and Physical  Blake Burgess MRN:[id number] DOB: 03/10/1950 DOA: 06/03/2013  Referring physician:  PCP: MCINNIS,ANGUS G, MD  Specialists: Gastroenterology  Chief Complaint: GI bleed   HPI: Blake Burgess is a 79 y.o. male with a past medical history of coronary artery disease, presently on aspirin therapy, who underwent EGD on 01/10/2013 for workup of dysphagia. Procedure revealed H. pylori gastritis, normal-appearing esophagus, dilated to 56 [place]. Procedure performed by Dr. Robert Rourke of a [facility]. He reports feeling ill last Friday, having weakness, dizziness and lightheadedness, particularly going from sitting to standing. He stated overall just not feeling well through the weekend then this morning starting at approximately 5:30, had 3 episodes of maroon-colored stools. He presented to the emergency department at [facility] where he had 2 more episodes of bloody bowel movements, characterized as maroon colored that was witnessed by emergency room staff. Patient denies nausea vomiting or hematemesis. He does report having some lower abdominal discomfort which resolved by the time he arrived at [facility]. Lab work at [facility] revealed a hemoglobin of 12.4 with hematocrit of 36.4. His BUN was 22 and had an INR of 0.95. He was found to be hypertensive with systolic blood pressures in the 170s. Patient was transferred to [facility] for a GI evaluation.   Review of Systems: The patient denies anorexia, fever, weight loss, vision loss, decreased hearing, hoarseness, chest pain, syncope, dyspnea on exertion, peripheral edema, balance deficits, hemoptysis, severe indigestion/heartburn, hematuria, incontinence, genital sores, suspicious skin lesions, transient blindness, difficulty walking, depression, unusual weight change, enlarged lymph nodes, angioedema,  and breast masses.    Past Medical History    Diagnosis  Date    .  Hypertension     .  Hyperlipidemia     .  Anxiety     .  Gastroesophageal reflux disease       possible small Schatzki's ring; esophageal dilatation in 2005    .  COPD (chronic obstructive pulmonary disease)       Chronic bronchitis; 100-pack-year cigarette consumption    .  Chest pain  2005      noncritical coronary disease in 2005: 40% distal RCA, 30% CX, 50% OM 2, EF-60%,    .  Overweight(278.02)     .  Tobacco abuse       100 pack years    .  H. pylori infection  2014      treated with prevpac       Past Surgical History    Procedure  Laterality  Date    .  Cervical spine surgery        C5-6 and C6-7: Relief of spinal stenosis with corpectomy, foraminotomy and fusion    .  Orif ankle fracture        right    .  Appendectomy      .  Tonsillectomy      .  Esophagogastroduodenoscopy   07/21/2004      RMR: A couple of tiny, distal esophageal erosions, otherwise normal esophagus aside from a Schatzki ring status post Maloney dilation as described above/ Small hiatal hernia, otherwise normal stomach, normal D1-D2    .  Colonoscopy   08/12/2004      RMR: Friable anal canal, likely source of patient's hematochezia/ Otherwise normal rectum, normal colon.    .  Orif tibia fracture          Left    .  Esophagogastroduodenoscopy (egd) with esophageal dilation  N/A  01/10/2013      RMR:Normal esophagus s/p dilation/Abnormal stomach of uncertain clinical significance-s/p bx positive for H. pylori, status post Prevpac treatment     Social History: reports that he has been smoking Cigarettes. He has a 100 pack-year smoking history. He does not have any smokeless tobacco history on file. He reports that he drinks about 1.2 ounces of alcohol per week. He reports that he does not use illicit drugs.  He is currently married and lives in [place] with his wife.    Allergies    Allergen  Reactions    .  Tomato  Rash        Family History    Problem  Relation  Age of Onset    .  Heart attack  Mother       deceased, age 50s    .  Ulcers  Brother     .  Diabetes  Brother     .  Colon cancer  Neg Hx     .  Hypertension         Prior to Admission medications    Medication  Sig  Start Date  End Date  Taking?  Authorizing Provider    albuterol (PROVENTIL HFA;VENTOLIN HFA) 108 (90 BASE) MCG/ACT inhaler  Inhale 2 puffs into the lungs every 6 (six) hours as needed. Shortness of Breath     Historical Provider, MD    amLODipine (NORVASC) 5 MG tablet  Take 1 tablet (5 mg total) by mouth daily.  05/25/13    Kathryn M Lawrence, NP    aspirin EC 81 MG tablet  Take 81 mg by mouth daily.     Historical Provider, MD    atenolol (TENORMIN) 50 MG tablet  Take 50 mg by mouth every morning.     Historical Provider, MD    atorvastatin (LIPITOR) 20 MG tablet  Take 20 mg by mouth daily.  12/15/12    Historical Provider, MD    atorvastatin (LIPITOR) 40 MG tablet  Take 40 mg by mouth daily.     Historical Provider, MD    LORazepam (ATIVAN) 1 MG tablet  Take 1 mg by mouth every 4 (four) hours as needed. For nerves     Historical Provider, MD    nitroGLYCERIN (NITROSTAT) 0.4 MG SL tablet  Place 0.4 mg under the tongue every 5 (five) minutes as needed. Chest Pain     Historical Provider, MD    oxyCODONE-acetaminophen (PERCOCET/ROXICET) 5-325 MG per tablet  Take 1 tablet by mouth every 4 (four) hours as needed for pain.  04/18/13    Donald W Wickline, MD    pantoprazole (PROTONIX) 40 MG tablet  Take 1 tablet (40 mg total) by mouth daily before breakfast.  02/28/13    Leslie S Lewis, PA-C    polyethylene glycol powder (GLYCOLAX/MIRALAX) powder  Take 17 g by mouth daily.  02/28/13    Leslie S Lewis, PA-C    tiotropium (SPIRIVA) 18 MCG inhalation capsule  Place 18 mcg into inhaler and inhale every morning.     Historical Provider, MD    Physical Exam:    Filed Vitals:     06/03/13 1303    BP:  174/63    Pulse:  79    Temp:  98.4 F (36.9 C)     Resp:  11     General: Patient is in no acute distress, chronically   ill appearing. Awake alert and oriented  Eyes: Pupils are equal round reactive to light extraocular movement is intact  Neck: Neck is supple symmetrical no jugular venous distention or carotid bruits  Cardiovascular: Regular rate and rhythm normal S1-S2 no murmurs rubs or gallops  Respiratory: Clear to auscultation bilaterally no wheezing rhonchi or rales normal story effort. Breath sounds are diminished bilaterally.  Abdomen: Overall soft, mild tenderness to palpation over the lower abdominal region otherwise no palpable masses, guarding, rebound tenderness  Skin: No skin lesions rashes noted  Musculoskeletal: Present range of motion to all extremities, no extremity edema  Psychiatric: Patient is awake alert and oriented x3  Neurologic: Cranial nerves II through XII are grossly intact no alteration to sensation global 5 of 5 muscle strength Labs on Admission:  Basic Metabolic Panel:  No results found for this basename: NA, K, CL, CO2, GLUCOSE, BUN, CREATININE, CALCIUM, MG, PHOS, in the last 168 hours  Liver Function Tests:  No results found for this basename: AST, ALT, ALKPHOS, BILITOT, PROT, ALBUMIN, in the last 168 hours  No results found for this basename: LIPASE, AMYLASE, in the last 168 hours  No results found for this basename: AMMONIA, in the last 168 hours  CBC:  No results found for this basename: WBC, NEUTROABS, HGB, HCT, MCV, PLT, in the last 168 hours  Cardiac Enzymes:  No results found for this basename: CKTOTAL, CKMB, CKMBINDEX, TROPONINI, in the last 168 hours  BNP (last 3 results)  No results found for this basename: PROBNP, in the last 8760 hours  CBG:  No results found for this basename: GLUCAP, in the last 168 hours  Radiological Exams on Admission:  No results found.  Assessment/Plan  Active Problems:  Hypertension  Hyperlipidemia  Gastroesophageal reflux disease  COPD (chronic obstructive  pulmonary disease)  Lower GI bleed   1. Probable lower GI bleed. Possibilities include diverticular bleed versus AVM. He reportedly had approximately 5 episodes of bloody stools this morning characterized as maroon colored. He is hemodynamically stable, with lab work obtained at [facility] showing a hemoglobin of 12.4 with hematocrit of 36.4. I have consulted gastroenterology, spoken with Dr.Ganem. Will type and screen patient, provide Protonix IV twice a day, IV fluid hydration, recheck a CBC, discontinue antiplatelet therapy for now, awaiting further recommendations from gastroenterology.  2. Volume depletion. Patient reporting dizziness and lightheadedness, particularly going from sitting to standing. He presents with complaints of GI bleed. Initial hemoglobin 12.4. Will provide IV fluid resuscitation with normal saline at 100 mL per hour. Obtain orthostatics in a.m. 3. Chronic obstructive pulmonary disease. Stable. Continue with when necessary duo nebs. 4. Hypertension. Patient not take his a.m. antihypertensive agents, and will restart atenolol and amlodipine, provide when necessary labetalol for systolic blood pressures greater than 165.  5. Dyslipidemia. Continue statin therapy 6. History of coronary artery disease. Patient presently chest pain-free. Will discontinue antiplatelet therapy for now due to the presence of GI bleed. Will continue statin and beta blocker.  7. Gastroesophageal reflux disease. PPI therapy 8. DVT prophylaxis. Bilateral extremity SCDs  Code Status: Full Code  Family Communication: Plan discussed with patient at bedside  Disposition Plan: I do not anticipate patient will require greater than 2 night hospitalization, will place patient in obs  Time spent: 65 minutes  Blake Burgess  [facility]  Pager [phone]  If 7PM-7AM, please contact night-coverage  [email or web address]  Password [email or web address]  06/03/2013, 2:41 PM       with patient at bedside  Disposition Plan: I do not anticipate patient will require greater than 2 night hospitalization, will place patient in obs  Time spent: 65 minutes  Vanessa Barbara [facility]  [facility]  Pager [phone]  If 7PM-7AM, please contact night-coverage  [email or web address]  Password [email or web address]   06/03/2013, 2:41 PM

## 2013-06-04 NOTE — Progress Notes (Signed)
Utilization review completed.  

## 2013-06-04 NOTE — Progress Notes (Signed)
Pt to be TX to 4N-31, VSS, called report

## 2013-06-05 DIAGNOSIS — I2510 Atherosclerotic heart disease of native coronary artery without angina pectoris: Secondary | ICD-10-CM

## 2013-06-05 MED ORDER — ASPIRIN 81 MG PO TABS: 81.0000 mg | ORAL_TABLET | Freq: Every day | ORAL | Status: DC

## 2013-06-05 MED ORDER — AMLODIPINE BESYLATE 10 MG PO TABS: 5.0000 mg | ORAL_TABLET | Freq: Every day | ORAL | Status: DC

## 2013-06-05 MED ORDER — INFLUENZA VAC SPLIT QUAD 0.5 ML IM SUSP: 0.5000 mL | INTRAMUSCULAR | Status: DC

## 2013-06-05 NOTE — Progress Notes (Signed)
[facility] Progress Note  Subjective: No signs of further bleeding. Review of her records showed that he had a colonoscopy in 2011 by Dr. Stan Head and it showed universal diverticulosis.  Objective: Vital signs in last 24 hours: Temp:  [97.3 F (36.3 C)-98 F (36.7 C)] 98 F (36.7 C) (10/14 0600) Pulse Rate:  [54-71] 71 (10/14 0600) Resp:  [13-18] 18 (10/14 0600) BP: (112-153)/(61-70) 153/70 mmHg (10/14 0600) SpO2:  [97 %-100 %] 97 % (10/14 0600) Weight change:    PE:  He is in no distress  Abdomen soft nontender  Lab Results: Results for orders placed during the hospital encounter of 06/03/13 (from the past 24 hour(s))  CBC     Status: Abnormal   Collection Time    06/04/13  2:00 PM      Result Value Range   WBC 6.1  4.0 - 10.5 K/uL   RBC 3.48 (*) 4.22 - 5.81 MIL/uL   Hemoglobin 11.1 (*) 13.0 - 17.0 g/dL   HCT 16.1 (*) 09.6 - 04.5 %   MCV 95.4  78.0 - 100.0 fL   MCH 31.9  26.0 - 34.0 pg   MCHC 33.4  30.0 - 36.0 g/dL   RDW 40.9  81.1 - 91.4 %   Platelets 262  150 - 400 K/uL    Studies/Results: @RISRSLT24 @    Assessment: Diverticular bleed, resolved  Plan: From a GI standpoint I think he is okay for discharge.    Andris Brothers F 06/05/2013, 9:57 AM

## 2013-06-05 NOTE — Evaluation (Signed)
Occupational Therapy Evaluation Patient Details Name: Blake Burgess MRN: [id number] DOB: 1931/10/04 Today's Date: 06/05/2013 Time: 1030-1046 OT Time Calculation (min): 16 min  OT Assessment / Plan / Recommendation History of present illness 79 y.o. male with a past medical history of coronary artery disease, presently on aspirin therapy, who underwent EGD on 01/10/2013 for workup of dysphagia. Procedure revealed H. pylori gastritis, normal-appearing esophagus, dilated to 56 [place]. Procedure performed by Dr. Cindie Laroche of a [facility]. He reported feeling ill, having weakness, dizziness and lightheadedness, particularly going from sitting to standing. He stated overall just not feeling well then had 3 episodes of maroon-colored stools. He presented to the emergency department at [facility] where he had 2 more episodes of bloody bowel movements, characterized as maroon colored. Patient denied nausea vomiting or hematemesis. He did report having some lower abdominal discomfort which resolved by the time he arrived at [facility]. Lab work at [facility] revealed a hemoglobin of 12.4 with hematocrit of 36.4. His BUN was 22 and had an INR of 0.95. He was found to be hypertensive with systolic blood pressures in the 170s. Patient was transferred to [facility] for a GI evaluation.     Clinical Impression   Pt moving well during evaluation. OT provided education to pt on safety. No further OT needs at this time.     OT Assessment  Patient does not need any further OT services    Follow Up Recommendations  No OT follow up;Supervision - Intermittent    Barriers to Discharge      Equipment Recommendations  None recommended by OT    Recommendations for Other Services    Frequency       Precautions / Restrictions     Pertinent Vitals/Pain No pain reported.     ADL  Eating/Feeding: Independent Where Assessed - Eating/Feeding: Chair Grooming: Supervision/safety (to  retrieve items) Where Assessed - Grooming: Supported sitting Upper Body Bathing: Supervision/safety (to retrieve items) Where Assessed - Upper Body Bathing: Supported sitting Lower Body Bathing: Supervision/safety Where Assessed - Lower Body Bathing: Unsupported sit to stand Upper Body Dressing: Supervision/safety (to retrieve items) Where Assessed - Upper Body Dressing: Supported sitting Lower Body Dressing: Supervision/safety Where Assessed - Lower Body Dressing: Unsupported sit to stand Toilet Transfer: Modified independent Toilet Transfer Method: Sit to Barista: Regular height toilet Tub/Shower Transfer: Simulated;Modified independent Tub/Shower Transfer Method: Science writer: Walk in shower Transfers/Ambulation Related to ADLs: Supervision for ambulation as he was holding onto furniture in room. Mod I for transfers ADL Comments: Pt reports that he feels his balance has been declining over time. OT recommended pt sitting on his shower chair in shower to wash LE's for safety and also educated to not grab his towel rack when stepping out as he says he does-told him to use hand held assist instead. Educated to stand in front of bed/chair when pulling up LB clothing. Spoke with him about safe shoewear and also to have rugs picked up in house to prevent falls.     OT Diagnosis:    OT Problem List:   OT Treatment Interventions:     OT Goals(Current goals can be found in the care plan section)    Visit Information  Last OT Received On: 06/05/13 Assistance Needed: +1 History of Present Illness: 79 y.o. male with a past medical history of coronary artery disease, presently on aspirin therapy, who underwent EGD on 01/10/2013 for workup of dysphagia. Procedure revealed H. pylori gastritis,  normal-appearing esophagus, dilated to 56 [place]. Procedure performed by Dr. Cindie Laroche of a [facility]. He reported feeling ill, having  weakness, dizziness and lightheadedness, particularly going from sitting to standing. He stated overall just not feeling well then had 3 episodes of maroon-colored stools. He presented to the emergency department at [facility] where he had 2 more episodes of bloody bowel movements, characterized as maroon colored. Patient denied nausea vomiting or hematemesis. He did report having some lower abdominal discomfort which resolved by the time he arrived at [facility]. Lab work at [facility] revealed a hemoglobin of 12.4 with hematocrit of 36.4. His BUN was 22 and had an INR of 0.95. He was found to be hypertensive with systolic blood pressures in the 170s. Patient was transferred to [facility] for a GI evaluation.         Prior Functioning     Home Living Family/patient expects to be discharged to:: Private residence Living Arrangements: Spouse/significant other Available Help at Discharge: Family Type of Home: House Home Access: Stairs to enter Secretary/administrator of Steps: 1 Entrance Stairs-Rails: None Home Layout: Able to live on main level with bedroom/bathroom Home Equipment: Cane - single point;Shower seat Prior Function Level of Independence: Independent Communication Communication: HOH Dominant Hand: Right         Vision/Perception     Cognition  Cognition Arousal/Alertness: Awake/alert Behavior During Therapy: WFL for tasks assessed/performed Overall Cognitive Status: Within Functional Limits for tasks assessed    Extremity/Trunk Assessment Upper Extremity Assessment Upper Extremity Assessment: Overall WFL for tasks assessed Lower Extremity Assessment Lower Extremity Assessment: Defer to PT evaluation     Mobility Bed Mobility Bed Mobility: Not assessed Transfers Transfers: Sit to Stand;Stand to Sit Sit to Stand: 6: Modified independent (Device/Increase time);From chair/3-in-1;From toilet Stand to Sit: 6: Modified independent (Device/Increase time);To  toilet;To chair/3-in-1 Details for Transfer Assistance: simulated regular height toilet transfer and cues for hand placement and technique-pt did well.      Exercise     Balance     End of Session OT - End of Session Activity Tolerance: Patient tolerated treatment well Patient left: in chair;with call bell/phone within reach  GO     Earlie Raveling OTR/L [phone] 06/05/2013, 1:41 PM

## 2013-06-05 NOTE — Progress Notes (Signed)
Pt d/c to home by car with family. Assessment stable. Prescriptions given. Pt verbalizes understanding of d/c instructions. 

## 2013-06-05 NOTE — Evaluation (Addendum)
Physical Therapy Evaluation Patient Details Name: Blake Burgess MRN: [id number] DOB: 1932-06-12 Today's Date: 06/05/2013 Time: 4098-1191 PT Time Calculation (min): 13 min  PT Assessment / Plan / Recommendation History of Present Illness  79 y.o. male with a past medical history of coronary artery disease, presently on aspirin therapy, who underwent EGD on 01/10/2013 for workup of dysphagia. Procedure revealed H. pylori gastritis, normal-appearing esophagus, dilated to 56 [place]. Procedure performed by Dr. Cindie Laroche of a [facility]. He reported feeling ill, having weakness, dizziness and lightheadedness, particularly going from sitting to standing. He stated overall just not feeling well then had 3 episodes of maroon-colored stools. He presented to the emergency department at [facility] where he had 2 more episodes of bloody bowel movements, characterized as maroon colored. Patient denied nausea vomiting or hematemesis. He did report having some lower abdominal discomfort which resolved by the time he arrived at [facility]. Lab work at [facility] revealed a hemoglobin of 12.4 with hematocrit of 36.4. His BUN was 22 and had an INR of 0.95. He was found to be hypertensive with systolic blood pressures in the 170s. Patient was transferred to [facility] for a GI evaluation.    Clinical Impression  NO ACUTE PT NEEDS AT THIS TIME, PT INDEPENDENT    PT Assessment  Patent does not need any further PT services    Follow Up Recommendations  No PT follow up          Equipment Recommendations  None recommended by PT             Mobility  Bed Mobility Bed Mobility: Supine to Sit;Sitting - Scoot to Edge of Bed Supine to Sit: 6: Modified independent (Device/Increase time) Sitting - Scoot to Edge of Bed: 6: Modified independent (Device/Increase time) Details for Bed Mobility Assistance: increased time to perform, VCs for positioing  Transfers Transfers: Sit to  Stand;Stand to Sit Sit to Stand: 6: Modified independent (Device/Increase time) Stand to Sit: 6: Modified independent (Device/Increase time) Details for Transfer Assistance: No assist needed Ambulation/Gait Ambulation/Gait Assistance: 7: Independent Ambulation Distance (Feet): 400 Feet Assistive device: None Ambulation/Gait Assistance Details: WFL Stairs: Yes Stairs Assistance: 7: Independent Stair Management Technique: One rail Right;Alternating pattern;Forwards Number of Stairs: 5      PT Goals(Current goals can be found in the care plan section) Acute Rehab PT Goals Patient Stated Goal: to go home PT Goal Formulation: No goals set, d/c therapy  Visit Information  Last PT Received On: 06/05/13 Assistance Needed: +1 History of Present Illness: 79 y.o. male with a past medical history of coronary artery disease, presently on aspirin therapy, who underwent EGD on 01/10/2013 for workup of dysphagia. Procedure revealed H. pylori gastritis, normal-appearing esophagus, dilated to 56 [place]. Procedure performed by Dr. Cindie Laroche of a [facility]. He reported feeling ill, having weakness, dizziness and lightheadedness, particularly going from sitting to standing. He stated overall just not feeling well then had 3 episodes of maroon-colored stools. He presented to the emergency department at [facility] where he had 2 more episodes of bloody bowel movements, characterized as maroon colored. Patient denied nausea vomiting or hematemesis. He did report having some lower abdominal discomfort which resolved by the time he arrived at [facility]. Lab work at [facility] revealed a hemoglobin of 12.4 with hematocrit of 36.4. His BUN was 22 and had an INR of 0.95. He was found to be hypertensive with systolic blood pressures in the 170s. Patient was transferred to [facility] for  a GI evaluation.         Prior Functioning  Home Living Family/patient expects to be discharged to::  Private residence Living Arrangements: Spouse/significant other Available Help at Discharge: Family Type of Home: House Home Access: Stairs to enter Secretary/administrator of Steps: 1 Entrance Stairs-Rails: None Home Layout: Able to live on main level with bedroom/bathroom Home Equipment: Cane - single point;Shower seat Prior Function Level of Independence: Independent Communication Communication: HOH Dominant Hand: Right    Cognition  Cognition Arousal/Alertness: Awake/alert Behavior During Therapy: WFL for tasks assessed/performed Overall Cognitive Status: Within Functional Limits for tasks assessed    Extremity/Trunk Assessment Upper Extremity Assessment Upper Extremity Assessment: Defer to OT evaluation Lower Extremity Assessment Lower Extremity Assessment: Overall WFL for tasks assessed   Balance High Level Balance High Level Balance Activites: Side stepping;Backward walking;Direction changes;Turns;Head turns High Level Balance Comments: Independent  End of Session PT - End of Session Equipment Utilized During Treatment: Gait belt Activity Tolerance: Patient tolerated treatment well Patient left: in bed;with call bell/phone within reach (sitting EOB) Nurse Communication: Mobility status  GP Functional Assessment Tool Used: clinical judgement Functional Limitation: Mobility: Walking and moving around Mobility: Walking and Moving Around Current Status (Q4696): At least 1 percent but less than 20 percent impaired, limited or restricted Mobility: Walking and Moving Around Goal Status [phone]): At least 1 percent but less than 20 percent impaired, limited or restricted Mobility: Walking and Moving Around Discharge Status [phone]): At least 1 percent but less than 20 percent impaired, limited or restricted   Fabio Asa 06/05/2013, 9:49 AM  Charlotte Crumb, PT DPT  [phone]

## 2013-06-05 NOTE — Discharge Summary (Addendum)
Physician Discharge Summary  Blake Burgess:[id number] DOB: [birth year] DOA: 06/03/2013  PCP: No primary provider on file.  Admit date: 06/03/2013 Discharge date: 06/05/2013  Time spent: >35 minutes  Recommendations for Outpatient Follow-up:  F/u with PCP in 2 weeks  Discharge Diagnoses:  Active Problems: GI bleeding;   HTN, CAD Presyncope / volume depletion   Discharge Condition: stable   Diet recommendation: heart healthy   Filed Weights   06/03/13 2100  Weight: 76.6 kg (168 lb 14 oz)    History of present illness:  79 y.o. male with a past medical history of coronary artery disease, presently on aspirin therapy, who underwent EGD on 01/10/2013 for workup of dysphagia. Procedure revealed H. pylori gastritis, normal-appearing esophagus, dilated to 56 [place]. Procedure performed by Dr. Cindie Laroche of a [facility] gastroenterology. He reports feeling ill last Friday, having weakness, dizziness and lightheadedness, particularly going from sitting to standing. He stated overall just not feeling well through the weekend then this morning starting at approximately 5:30, had 3 episodes of maroon-colored stools. He presented to the emergency department at [facility] where he had 2 more episodes of bloody bowel movements, characterized as maroon colored that was witnessed by emergency room staff. Patient denies nausea vomiting or hematemesis. He does report having some lower abdominal discomfort which resolved by the time he arrived at [facility]. Lab work at [place] revealed a hemoglobin of 12.4 with hematocrit of 36.4.    Hospital Course:  Patient had no new new episodes of bleeding; no dizziness, hematochezia resolved likely due to diverticulosis; patient was seen evaluated by GI who did not recommend colonoscopy at this time; he cleared the patient to be discharged and follow up outpatient   Procedures:  CT (i.e. Studies not automatically included, echos, thoracentesis, etc;  not x-rays)  Consultations:  GI  Discharge Exam: Filed Vitals:   06/05/13 0600  BP: 153/70  Pulse: 71  Temp: 98 F (36.7 C)  Resp: 18    General: alert Cardiovascular: s1,s2 rrr Respiratory: cta bl   Discharge Instructions  Discharge Orders   Future Orders Complete By Expires   Diet - low sodium heart healthy  As directed    Discharge instructions  As directed    Comments:     Please follow up with primary care doctor in 1-2 weeks   Increase activity slowly  As directed        Medication List    STOP taking these medications       meloxicam 7.5 MG tablet  Commonly known as:  MOBIC      TAKE these medications       amLODipine 10 MG tablet  Commonly known as:  NORVASC  Take 0.5 tablets (5 mg total) by mouth daily.     aspirin 81 MG tablet  Take 1 tablet (81 mg total) by mouth daily.  Start taking on:  06/12/2013     atorvastatin 10 MG tablet  Commonly known as:  LIPITOR  Take 10 mg by mouth at bedtime.     enalapril 20 MG tablet  Commonly known as:  VASOTEC  Take 20 mg by mouth daily.     gabapentin 100 MG capsule  Commonly known as:  NEURONTIN  Take 100 mg by mouth daily.     HYDROcodone-acetaminophen 5-325 MG per tablet  Commonly known as:  NORCO/VICODIN  Take 1 tablet by mouth 2 (two) times daily as needed for pain.     metoprolol succinate 100 MG 24 hr  tablet  Commonly known as:  TOPROL-XL  Take 50 mg by mouth daily.     omeprazole 20 MG capsule  Commonly known as:  PRILOSEC  Take 20 mg by mouth daily.       No Known Allergies     Follow-up Information   Follow up with Graylin Shiver, MD In 4 weeks.   Specialty:  Gastroenterology   Contact information:   [address] [place] [address] [phone]        The results of significant diagnostics from this hospitalization (including imaging, microbiology, ancillary and laboratory) are listed below for reference.    Significant Diagnostic Studies: No results  found.  Microbiology: Recent Results (from the past 240 hour(s))  MRSA PCR SCREENING     Status: None   Collection Time    06/03/13  1:46 PM      Result Value Range Status   MRSA by PCR NEGATIVE  NEGATIVE Final   Comment:            The GeneXpert MRSA Assay (FDA     approved for NASAL specimens     only), is one component of a     comprehensive MRSA colonization     surveillance program. It is not     intended to diagnose MRSA     infection nor to guide or     monitor treatment for     MRSA infections.     Labs: Basic Metabolic Panel:  Recent Labs Lab 06/03/13 1544 06/04/13 0442  NA 140 137  K 4.5 3.7  CL 106 105  CO2 26 24  GLUCOSE 121* 104*  BUN 15 12  CREATININE 0.78 0.74  CALCIUM 8.2* 8.5   Liver Function Tests:  Recent Labs Lab 06/03/13 1544  AST 17  ALT 8  ALKPHOS 42  BILITOT 0.5  PROT 5.9*  ALBUMIN 3.3*   No results found for this basename: LIPASE, AMYLASE,  in the last 168 hours No results found for this basename: AMMONIA,  in the last 168 hours CBC:  Recent Labs Lab 06/03/13 1544 06/04/13 0442 06/04/13 1400  WBC 6.2 6.7 6.1  NEUTROABS 3.8  --   --   HGB 11.2* 11.6* 11.1*  HCT 32.6* 33.3* 33.2*  MCV 93.4 92.8 95.4  PLT 249 256 262   Cardiac Enzymes: No results found for this basename: CKTOTAL, CKMB, CKMBINDEX, TROPONINI,  in the last 168 hours BNP: BNP (last 3 results) No results found for this basename: PROBNP,  in the last 8760 hours CBG: No results found for this basename: GLUCAP,  in the last 168 hours     Signed:  Esperanza Sheets  [facility] 06/05/2013, 9:18 AM

## 2013-08-31 ENCOUNTER — Encounter: Payer: Self-pay | Admitting: Cardiology

## 2013-08-31 ENCOUNTER — Ambulatory Visit (INDEPENDENT_AMBULATORY_CARE_PROVIDER_SITE_OTHER): Payer: Medicare Other | Admitting: Cardiology

## 2013-08-31 VITALS — BP 154/67 | HR 71 | Ht 69.0 in | Wt 166.0 lb

## 2013-08-31 DIAGNOSIS — R5381 Other malaise: Secondary | ICD-10-CM

## 2013-08-31 DIAGNOSIS — I10 Essential (primary) hypertension: Secondary | ICD-10-CM

## 2013-08-31 DIAGNOSIS — I2510 Atherosclerotic heart disease of native coronary artery without angina pectoris: Secondary | ICD-10-CM

## 2013-08-31 DIAGNOSIS — R5383 Other fatigue: Secondary | ICD-10-CM

## 2013-08-31 DIAGNOSIS — R42 Dizziness and giddiness: Secondary | ICD-10-CM

## 2013-08-31 LAB — CBC
HCT: 34.7 % — ABNORMAL LOW (ref 39.0–52.0)
Hemoglobin: 11.5 g/dL — ABNORMAL LOW (ref 13.0–17.0)
MCHC: 33.2 g/dL (ref 30.0–36.0)
MCV: 92.7 fl (ref 78.0–100.0)
PLATELETS: 402 10*3/uL — AB (ref 150.0–400.0)
RBC: 3.74 Mil/uL — ABNORMAL LOW (ref 4.22–5.81)
RDW: 13 % (ref 11.5–14.6)
WBC: 7.2 10*3/uL (ref 4.5–10.5)

## 2013-08-31 NOTE — Progress Notes (Signed)
HPI The patient presents for one-year followup of his known coronary disease  In 2012 he did have an abnormal stress test followed by catheterization which demonstrated patent bypass grafts. He does have some dyspnea. This is sporadic. He can do activities such as shoveling out the pig stye and not necessarily have significant complaints although he might get winded. With this activity he doesn't get chest pressure, neck or arm discomfort. However, on another day he might get dyspneic just taking a shower. She's not describing any resting shortness of breath, PND or orthopnea. He's not having any palpitations, presyncope or syncope. He has had no weight gain edema. I did note that he was in the hospital in the fall with some GI bleeding without clear etiology. I reviewed these records.  No Known Allergies  Current Outpatient Prescriptions  Medication Sig Dispense Refill  . aspirin 81 MG tablet Take 1 tablet (81 mg total) by mouth daily.  30 tablet    . atorvastatin (LIPITOR) 10 MG tablet Take 10 mg by mouth at bedtime.       . enalapril (VASOTEC) 20 MG tablet Take 20 mg by mouth daily.       . meloxicam (MOBIC) 7.5 MG tablet Take 7.5 mg by mouth as directed.       . metoprolol (TOPROL-XL) 100 MG 24 hr tablet Take 50 mg by mouth 2 (two) times daily.       Marland Kitchen omeprazole (PRILOSEC) 20 MG capsule Take 20 mg by mouth daily.        No current facility-administered medications for this visit.    Past Medical History  Diagnosis Date  . Other and unspecified hyperlipidemia   . Hypertension   . Coronary artery disease     a.  s/p CABG;   b. cath 4/12: EF 55%, 3vCAD, patent L-LAD, patent S-RCA, patent S-CFX (done 2/2 false pos. ETT)  . Schatzki's ring   . HH (hiatus hernia)   . Diverticular disease   . Hemorrhoids   . Stroke   . GI bleed   . Osteoarthritis     Past Surgical History  Procedure Laterality Date  . Coronary artery bypass graft  06/21/2007    CABG x 3 Surgeon Ivin Poot, MD    . Hip replace  06/09/2004    left hip Surgeon Pietro Cassis. Alvan Dame, MD  . Arteriovenous graft placement w/ endoscopic vein harvest      of the right leg greater spahenous vein. Surgeon: Tharon Aquas Trigt,M.D.  . Colonoscopy  02/24/2010    Hemorrhoids, Diverticulosis. Performed at [facility]. Normal terminal ileum. Dr. June Leap, [facility]    ROS:  Hip pain.  Otherwise as stated in the HPI and negative for all other systems.  PHYSICAL EXAM BP 154/67  Pulse 71  Ht 5\' 9"  (1.753 m)  Wt 166 lb (75.297 kg)  BMI 24.50 kg/m2 GENERAL:  Well appearing HEENT:  Pupils equal round and reactive, fundi not visualized, oral mucosa unremarkable, poor dentition NECK:  No jugular venous distention, waveform within normal limits, carotid upstroke brisk and symmetric, no bruits, no thyromegaly LYMPHATICS:  No cervical, inguinal adenopathy LUNGS:  Clear to auscultation bilaterally BACK:  No CVA tenderness CHEST:  Well healed sternotomy scar. HEART:  PMI not displaced or sustained,S1 and S2 within normal limits, no S3, no S4, no clicks, no rubs, no murmurs ABD:  Flat, positive bowel sounds normal in frequency in pitch, no bruits, no rebound, no guarding, no midline pulsatile mass, no hepatomegaly, no  splenomegaly EXT:  2 plus pulses throughout, no edema, no cyanosis no clubbing  EKG:  Sinus rhythm, rate 73, right axis deviation probably lead placement, premature ventricular contraction, no acute ST-T wave changes, QTC slightly prolonged.  08/31/2013  ASSESSMENT AND PLAN  CAD:  He has some atypical symptoms but nothing that he says has really changed since his catheterization in 2012. I nothing stress testing would be helpful to evaluate his mild dyspnea as he had a false positive before. We will continue with risk reduction.  HTN:  He does not feel good when his blood pressure is lower. We will continue him on the meds as listed.  DYSPNEA:  Given his GI bleeding and this complaint I will check a CBC.

## 2013-08-31 NOTE — Patient Instructions (Signed)
The current medical regimen is effective;  continue present plan and medications.  Please have CBC today.  Follow up in 1 year with Dr Percival Spanish.  You will receive a letter in the mail 2 months before you are due.  Please call us when you receive this letter to schedule your follow up appointment.

## 2014-09-02 ENCOUNTER — Ambulatory Visit (INDEPENDENT_AMBULATORY_CARE_PROVIDER_SITE_OTHER): Payer: Medicare Other | Admitting: Cardiology

## 2014-09-02 ENCOUNTER — Encounter: Payer: Self-pay | Admitting: Cardiology

## 2014-09-02 VITALS — BP 156/80 | HR 69 | Ht 69.0 in | Wt 168.1 lb

## 2014-09-02 DIAGNOSIS — I10 Essential (primary) hypertension: Secondary | ICD-10-CM

## 2014-09-02 DIAGNOSIS — I2510 Atherosclerotic heart disease of native coronary artery without angina pectoris: Secondary | ICD-10-CM

## 2014-09-02 DIAGNOSIS — R0989 Other specified symptoms and signs involving the circulatory and respiratory systems: Secondary | ICD-10-CM

## 2014-09-02 NOTE — Progress Notes (Signed)
HPI The patient presents for one-year followup of his known coronary disease  In 2012 he did have an abnormal stress test followed by catheterization which demonstrated patent bypass grafts.   Since then he has had no new cardiovascular complaints.  He still does some activity such as shoveling out the pig stye.  However, he is limited by some joint pain.  The patient denies any new symptoms such as neck or arm discomfort. There has been no new shortness of breath, PND or orthopnea. There have been no reported palpitations, presyncope or syncope.   He does have fatigue.  He has had some rare fleeting sharp chest pain not like previous angina.    No Known Allergies  Current Outpatient Prescriptions  Medication Sig Dispense Refill  . acetaminophen (TYLENOL) 500 MG tablet Take 1,000 mg by mouth 2 (two) times daily.    Marland Kitchen aspirin 81 MG tablet Take 1 tablet (81 mg total) by mouth daily. 30 tablet   . atorvastatin (LIPITOR) 10 MG tablet Take 10 mg by mouth at bedtime.     . enalapril (VASOTEC) 20 MG tablet Take 20 mg by mouth daily.     . metoprolol (TOPROL-XL) 100 MG 24 hr tablet Take 50 mg by mouth daily.     Marland Kitchen omeprazole (PRILOSEC) 20 MG capsule Take 20 mg by mouth daily.      No current facility-administered medications for this visit.    Past Medical History  Diagnosis Date  . Other and unspecified hyperlipidemia   . Hypertension   . Coronary artery disease     a.  s/p CABG;   b. cath 4/12: EF 55%, 3vCAD, patent L-LAD, patent S-RCA, patent S-CFX (done 2/2 false pos. ETT)  . Schatzki's ring   . HH (hiatus hernia)   . Diverticular disease   . Hemorrhoids   . Stroke   . GI bleed   . Osteoarthritis     Past Surgical History  Procedure Laterality Date  . Coronary artery bypass graft  06/21/2007    CABG x 3 Surgeon Ivin Poot, MD  . Hip replace  06/09/2004    left hip Surgeon Pietro Cassis. Alvan Dame, MD  . Arteriovenous graft placement w/ endoscopic vein harvest      of the right leg  greater spahenous vein. Surgeon: Tharon Aquas Trigt,M.D.  . Colonoscopy  02/24/2010    Hemorrhoids, Diverticulosis. Performed at [facility]. Normal terminal ileum. Dr. June Leap, [facility]    ROS:  Hip pain and fatigue.  Otherwise as stated in the HPI and negative for all other systems.  PHYSICAL EXAM BP 156/80 mmHg  Pulse 69  Ht 5\' 9"  (1.753 m)  Wt 168 lb 1.6 oz (76.25 kg)  BMI 24.81 kg/m2 GENERAL:  Well appearing HEENT:  Pupils equal round and reactive, fundi not visualized, oral mucosa unremarkable, poor dentition NECK:  No jugular venous distention, waveform within normal limits, carotid upstroke brisk and symmetric, soft right bruitIand aa, no thyromegaly LYMPHATICS:  No cervical, inguinal adenopathy LUNGS:  Clear to auscultation bilaterally BACK:  No CVA tenderness CHEST:  Well healed sternotomy scar. HEART:  PMI not displaced or sustained,S1 and S2 within normal limits, no S3, no S4, no clicks, no rubs, no murmurs ABD:  Flat, positive bowel sounds normal in frequency in pitch, no bruits, no rebound, no guarding, no midline pulsatile mass, no hepatomegaly, no splenomegaly EXT:  2 plus pulses throughout, no edema, no cyanosis no clubbing  EKG:  Sinus rhythm, rate 69, axis within normal  limits, intervals within normal limits, no acute ST-T wave changes..  09/02/2014  ASSESSMENT AND PLAN  CAD:  He has some atypical symptoms but nothing that he says has really changed since his catheterization in 2012.   No stress testing is indicated.  He will continue with risk reduction.   HTN:  He does not feel good when his blood pressure is lower.  He reports and that is in the 130s at home. He will check his blood pressure cuff reduced his primary care doctor to make sure it is accurate. He will remain on the meds as listed.  BRUIT:  I will order a carotid Doppler.  HYPERLIPIDEMIA:  He had this checked with his PCP.  I differ to her therapy.

## 2014-09-02 NOTE — Patient Instructions (Signed)
Your physician wants you to follow-up in: 1 Year You will receive a reminder letter in the mail two months in advance. If you don't receive a letter, please call our office to schedule the follow-up appointment.  Your physician has requested that you have a carotid duplex. This test is an ultrasound of the carotid arteries in your neck. It looks at blood flow through these arteries that supply the brain with blood. Allow one hour for this exam. There are no restrictions or special instructions.

## 2014-10-21 ENCOUNTER — Ambulatory Visit (HOSPITAL_COMMUNITY)
Admission: RE | Admit: 2014-10-21 | Discharge: 2014-10-21 | Disposition: A | Discharge status: Home / Self Care | Payer: Medicare Other | Source: Ambulatory Visit | Attending: Cardiology | Admitting: Cardiology

## 2014-10-21 DIAGNOSIS — R0989 Other specified symptoms and signs involving the circulatory and respiratory systems: Secondary | ICD-10-CM

## 2014-10-21 NOTE — Progress Notes (Signed)
Carotid Duplex Completed.  No evidence of a significant stenosis in bilateral ICAs. Oda Cogan, BS, RDMS, RVT

## 2015-02-19 ENCOUNTER — Telehealth: Payer: Self-pay | Admitting: Cardiology

## 2015-02-19 NOTE — Telephone Encounter (Signed)
Sounds good .. I agree with that recommendation.  Dr. Lemmie Evens

## 2015-02-19 NOTE — Telephone Encounter (Signed)
Called patient back. States "has to make himself go" (to get up, move around the house). Fatigued only - denies any dyspnea, chest pain, other pain, nausea, dizziness, fever, or URI symptoms. Denies weight gain, edema. No recent med changes.  Wife reports checking BP, was 158/79 and HR of 69  Notes these symptoms occur occasionally.  Wife thinks he can be seen today by primary. Advised to contact PCP for work-in - will route to [employer] for additional considerations.

## 2015-02-19 NOTE — Telephone Encounter (Signed)
Mrs.Stellmach is calling because Mr. Erhard has been very fatique , no energy. Please call   Thanks

## 2015-09-04 ENCOUNTER — Ambulatory Visit: Payer: Medicare Other | Admitting: Cardiology

## 2015-10-09 NOTE — Progress Notes (Signed)
HPI The patient presents for one-year followup of his known coronary disease  In 2012 he did have an abnormal stress test followed by catheterization which demonstrated patent bypass grafts.   Since then he has had no new cardiovascular complaints.  He still does some activity such as shoveling out the pig stye. 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  He did have a recent ED visit with hip pain.  This was apparently severe but much improved since injection.  The patient denies any new symptoms such as chest discomfort, neck or arm discomfort. There has been no new shortness of breath, PND or orthopnea. There have been no reported palpitations, presyncope or syncope.  No Known Allergies  Current Outpatient Prescriptions  Medication Sig Dispense Refill  . aspirin 81 MG tablet Take 1 tablet (81 mg total) by mouth daily. 30 tablet   . atorvastatin (LIPITOR) 10 MG tablet Take 10 mg by mouth at bedtime.     . enalapril (VASOTEC) 20 MG tablet Take 20 mg by mouth daily.     . metoprolol (TOPROL-XL) 100 MG 24 hr tablet Take 50 mg by mouth daily.     Marland Kitchen omeprazole (PRILOSEC) 20 MG capsule Take 20 mg by mouth daily.      No current facility-administered medications for this visit.    Past Medical History  Diagnosis Date  . Other and unspecified hyperlipidemia   . Hypertension   . Coronary artery disease     a.  s/p CABG;   b. cath 4/12: EF 55%, 3vCAD, patent L-LAD, patent S-RCA, patent S-CFX (done after a false pos. ETT)  . Schatzki's ring   . HH (hiatus hernia)   . Diverticular disease   . Hemorrhoids   . Stroke ([place])   . GI bleed   . Osteoarthritis     Past Surgical History  Procedure Laterality Date  . Coronary artery bypass graft  06/21/2007    CABG x 3 Surgeon Ivin Poot, MD  . Hip replace  06/09/2004    left hip Surgeon Pietro Cassis. Alvan Dame, MD  . Arteriovenous graft placement w/ endoscopic vein harvest      of the right leg greater spahenous vein. Surgeon: Tharon Aquas Trigt,M.D.  .  Colonoscopy  02/24/2010    Hemorrhoids, Diverticulosis. Performed at [facility]. Normal terminal ileum. Dr. June Leap, [facility]    ROS:  As stated in the HPI and negative for all other systems.  PHYSICAL EXAM BP 150/70 mmHg  Pulse 54  Ht 5\' 8"  (1.727 m)  Wt 168 lb 7 oz (76.403 kg)  BMI 25.62 kg/m2 GENERAL:  Well appearing HEENT:  Pupils equal round and reactive, fundi not visualized, oral mucosa unremarkable, poor dentition NECK:  No jugular venous distention, waveform within normal limits, carotid upstroke brisk and symmetric, soft right bruitIand aa, no thyromegaly LYMPHATICS:  No cervical, inguinal adenopathy LUNGS:  Clear to auscultation bilaterally BACK:  No CVA tenderness CHEST:  Well healed sternotomy scar. HEART:  PMI not displaced or sustained,S1 and S2 within normal limits, no S3, no S4, no clicks, no rubs, no murmurs ABD:  Flat, positive bowel sounds normal in frequency in pitch, no bruits, no rebound, no guarding, no midline pulsatile mass, no hepatomegaly, no splenomegaly EXT:  2 plus pulses throughout, no edema, no cyanosis no clubbing  EKG:  Sinus rhythm, rate 55, axis within normal limits, intervals within normal limits, no acute ST-T wave changes..  10/10/2015  ASSESSMENT AND PLAN  CAD:  The patient has  no new sypmtoms.  No further cardiovascular testing is indicated.  We will continue with aggressive risk reduction and meds as listed.  HTN:  His blood pressure is mildly elevated but he does not want to take more medications.     HYPERLIPIDEMIA:  He has not had a recent lipid profile and I will ask him to get this and have the results sent to me.  He was given a written order.

## 2015-10-10 ENCOUNTER — Encounter: Payer: Self-pay | Admitting: Cardiology

## 2015-10-10 ENCOUNTER — Ambulatory Visit (INDEPENDENT_AMBULATORY_CARE_PROVIDER_SITE_OTHER): Payer: Medicare Other | Admitting: Cardiology

## 2015-10-10 VITALS — BP 150/70 | HR 54 | Ht 68.0 in | Wt 168.4 lb

## 2015-10-10 DIAGNOSIS — I2510 Atherosclerotic heart disease of native coronary artery without angina pectoris: Secondary | ICD-10-CM | POA: Diagnosis not present

## 2015-10-10 NOTE — Patient Instructions (Signed)
Medication Instructions:   NO CHANGE  Labwork:  Your physician recommends that you return for lab work [place]  Follow-Up:  Your physician wants you to follow-up in: [place] will receive a reminder letter in the mail two months in advance. If you don't receive a letter, please call our office to schedule the follow-up appointment.   If you need a refill on your cardiac medications before your next appointment, please call your pharmacy.

## 2015-10-27 ENCOUNTER — Encounter: Payer: Self-pay | Admitting: Cardiology

## 2016-01-05 ENCOUNTER — Telehealth: Payer: Self-pay | Admitting: Cardiology

## 2016-01-05 NOTE — Telephone Encounter (Signed)
Called pt back. He says he's been experiencing some swelling in his ankles and feet. His socks leave a small indention when he removes them. It does not pit when the pt presses in on the swelling with his fingers. He denies CP, palpitations, dizziness, n/v. He says he only gets SOB when he really exerts himself.  Please advise, will route to Dr Percival Spanish and Meredith Pel.

## 2016-01-05 NOTE — Telephone Encounter (Signed)
Pt c/o swelling: STAT is pt has developed SOB within 24 hours  1. How long have you been experiencing swelling? About a week  2. Where is the swelling located? Feet/ankles- both  3.  Are you currently taking a "fluid pill"? Yes- pt wife does not know name  4.  Are you currently SOB? No- pt wife states he does not feel well  5.  Have you traveled recently?no

## 2016-01-05 NOTE — Telephone Encounter (Signed)
Without SOB I would not suggest a change in medications.

## 2016-01-06 NOTE — Telephone Encounter (Signed)
F/u  Pt stated- returning RN phone call; Please call back and discuss.   

## 2016-01-06 NOTE — Telephone Encounter (Signed)
Spoke with pt, aware of dr hochrein's recommendations. ?

## 2016-02-03 DIAGNOSIS — R42 Dizziness and giddiness: Secondary | ICD-10-CM | POA: Insufficient documentation

## 2016-02-05 ENCOUNTER — Telehealth: Payer: Self-pay | Admitting: Internal Medicine

## 2016-02-05 NOTE — Telephone Encounter (Signed)
I received an on-call page from Dr. Aggie Moats in the [place], [place] emergency department regarding Blake Burgess. Apparently he presented yesterday with chest pain and underwent triple rule out which was negative. Coronary enzymes were negative. He presented again less than 24 hours later with some recurrent chest pain symptoms. Initial troponin there is negative and EKG is nonischemic. The etiology of his pain is unclear. We were contacted because of his history of coronary disease. Blake Burgess had bypass surgery in 2008 and a heart catheterization in 2012 after an abnormal stress test which was a false positive and showed patent bypass grafts. I discussed the case with the ER physician and feel that if the patient has a second negative troponin, he would be safe for discharge, but we would certainly like to consider stress testing in our office. I will recheck to Dr. Percival Spanish to schedule that study and follow-up with him.  Pixie Casino, MD, [facility] Attending Cardiologist [place]

## 2016-02-05 NOTE — Telephone Encounter (Signed)
Please schedule follow-up with me next week.

## 2016-02-09 NOTE — Telephone Encounter (Signed)
Pt have appt on 06/23 @ 2:00 pm

## 2016-02-12 ENCOUNTER — Ambulatory Visit: Payer: Medicare Other | Admitting: Cardiology

## 2016-02-12 NOTE — Progress Notes (Signed)
HPI The patient presents for followup of his known coronary disease  In 2012 he did have an abnormal stress test followed by catheterization which demonstrated patent bypass grafts.  He was added to the schedule because he wa sin the ED in [place].  He had negative cardiac enzymes. He was sent home and came back within 24 hours again with chest pain but no evidence of ischemia on EKG or elevated enzymes. He presents for follow-up of this. Since I last saw him he was in the hospital at [facility].  He first came in after a very brief syncopal episode and was found to be bradycardic. I did get to review some discharge records but not complete records. There was apparently some heart block. He was taken off the beta blocker. Norvasc was added. He was supposed to increase his ACE inhibitor to twice daily but he didn't do this. She came back a couple of days later with some chest discomfort and was just in the emergency room at that time. He was not thought to have any angina and his heart rate was improved. He now returns for follow-up of this. He says he is doing well. He denies any chest pressure ongoing. He's had no neck or arm discomfort. She's had no further presyncope or syncope other than some mild orthostatic symptoms. He denies any shortness of breath, PND or orthopnea. He's not been as active as he was previously since he got out of the hospital.  No Known Allergies  Current Outpatient Prescriptions  Medication Sig Dispense Refill  . amLODipine (NORVASC) 5 MG tablet Take 2.5 mg by mouth daily.    Marland Kitchen aspirin 81 MG tablet Take 1 tablet (81 mg total) by mouth daily. 30 tablet   . atorvastatin (LIPITOR) 10 MG tablet Take 10 mg by mouth at bedtime.     . enalapril (VASOTEC) 20 MG tablet Take 20 mg by mouth.     Marland Kitchen omeprazole (PRILOSEC) 20 MG capsule Take 20 mg by mouth daily.      No current facility-administered medications for this visit.    Past Medical History  Diagnosis Date  . Other and  unspecified hyperlipidemia   . Hypertension   . Coronary artery disease     a.  s/p CABG;   b. cath 4/12: EF 55%, 3vCAD, patent L-LAD, patent S-RCA, patent S-CFX (done after a false pos. ETT)  . Schatzki's ring   . HH (hiatus hernia)   . Diverticular disease   . Hemorrhoids   . Stroke ([place])   . GI bleed   . Osteoarthritis     Past Surgical History  Procedure Laterality Date  . Coronary artery bypass graft  06/21/2007    CABG x 3 Surgeon Ivin Poot, MD  . Hip replace  06/09/2004    left hip Surgeon Pietro Cassis. Alvan Dame, MD  . Arteriovenous graft placement w/ endoscopic vein harvest      of the right leg greater spahenous vein. Surgeon: Tharon Aquas Trigt,M.D.  . Colonoscopy  02/24/2010    Hemorrhoids, Diverticulosis. Performed at [place]. Normal terminal ileum. Dr. June Leap, [facility]    ROS:  As stated in the HPI and negative for all other systems.  PHYSICAL EXAM BP 146/42 mmHg  Pulse 52  Ht 5\' 9"  (1.753 m)  Wt 175 lb (79.379 kg)  BMI 25.83 kg/m2 GENERAL:  Well appearing HEENT:  Pupils equal round and reactive, fundi not visualized, oral mucosa unremarkable, poor dentition NECK:  No jugular venous distention,  waveform within normal limits, carotid upstroke brisk and symmetric, soft bilateral bruits, no thyromegaly LYMPHATICS:  No cervical, inguinal adenopathy LUNGS:  Clear to auscultation bilaterally BACK:  No CVA tenderness CHEST:  Well healed sternotomy scar. HEART:  PMI not displaced or sustained,S1 and S2 within normal limits, no S3, no S4, no clicks, no rubs, 2 out of 6 apical early peaking systolic murmur radiating out the aortic Tract, no diastolic murmurs ABD:  Flat, positive bowel sounds normal in frequency in pitch, no bruits, no rebound, no guarding, no midline pulsatile mass, no hepatomegaly, no splenomegaly EXT:  2 plus pulses upper and decreased DP/PT bilateral, no edema, no cyanosis no clubbing  EKG:  Sinus rhythm , 2-1 AV block, ventricular rate 60,  interventricular conduction delay, no acute ST-T wave changes.   02/13/2016  ASSESSMENT AND PLAN  CAD:  Given the recent chest pain I will plan a stress test.  He cannot walk on a treadmill.  Therefore he will have a [employer].  HTN:  His blood pressure is mildly elevated.  He will continue on the meds as listed.    HYPERLIPIDEMIA:   His LDL was 55 in March.  He will continue on the meds as listed.  BRADYCARDIA:  He doesn't seem to have any symptoms. At this point he will remain off the atenolol. If he has further symptoms I will consider an event monitor or Holter to evaluate.    DECREASED PEDAL PULSES:  Check ABIs.

## 2016-02-13 ENCOUNTER — Ambulatory Visit (INDEPENDENT_AMBULATORY_CARE_PROVIDER_SITE_OTHER): Payer: Medicare Other | Admitting: Cardiology

## 2016-02-13 ENCOUNTER — Encounter: Payer: Self-pay | Admitting: Cardiology

## 2016-02-13 VITALS — BP 146/42 | HR 52 | Ht 69.0 in | Wt 175.0 lb

## 2016-02-13 DIAGNOSIS — R001 Bradycardia, unspecified: Secondary | ICD-10-CM

## 2016-02-13 DIAGNOSIS — I2510 Atherosclerotic heart disease of native coronary artery without angina pectoris: Secondary | ICD-10-CM | POA: Diagnosis not present

## 2016-02-13 DIAGNOSIS — R55 Syncope and collapse: Secondary | ICD-10-CM

## 2016-02-13 DIAGNOSIS — I739 Peripheral vascular disease, unspecified: Secondary | ICD-10-CM

## 2016-02-13 NOTE — Patient Instructions (Signed)
Medication Instructions:  Continue current medications  Labwork: NONE  Testing/Procedures: Your physician has requested that you have a lexiscan myoview. For further information please visit [email or web address]. Please follow instruction sheet, as given.  Your physician has requested that you have an ankle brachial index (ABI). During this test an ultrasound and blood pressure cuff are used to evaluate the arteries that supply the arms and legs with blood. Allow thirty minutes for this exam. There are no restrictions or special instructions.  Follow-Up: Your physician recommends that you schedule a follow-up appointment in: 2 Months   Any Other Special Instructions Will Be Listed Below (If Applicable).   If you need a refill on your cardiac medications before your next appointment, please call your pharmacy.

## 2016-02-17 ENCOUNTER — Telehealth (HOSPITAL_COMMUNITY): Payer: Self-pay

## 2016-02-17 NOTE — Telephone Encounter (Signed)
Encounter complete. 

## 2016-02-18 ENCOUNTER — Ambulatory Visit (HOSPITAL_COMMUNITY)
Admission: RE | Admit: 2016-02-18 | Discharge: 2016-02-18 | Disposition: A | Discharge status: Home / Self Care | Payer: Medicare Other | Source: Ambulatory Visit | Attending: Cardiovascular Disease | Admitting: Cardiovascular Disease

## 2016-02-18 ENCOUNTER — Other Ambulatory Visit: Payer: Self-pay | Admitting: Cardiology

## 2016-02-18 DIAGNOSIS — R42 Dizziness and giddiness: Secondary | ICD-10-CM | POA: Insufficient documentation

## 2016-02-18 DIAGNOSIS — I10 Essential (primary) hypertension: Secondary | ICD-10-CM | POA: Diagnosis not present

## 2016-02-18 DIAGNOSIS — I2510 Atherosclerotic heart disease of native coronary artery without angina pectoris: Secondary | ICD-10-CM | POA: Diagnosis present

## 2016-02-18 DIAGNOSIS — R5383 Other fatigue: Secondary | ICD-10-CM | POA: Insufficient documentation

## 2016-02-18 DIAGNOSIS — I779 Disorder of arteries and arterioles, unspecified: Secondary | ICD-10-CM | POA: Diagnosis not present

## 2016-02-18 DIAGNOSIS — R0989 Other specified symptoms and signs involving the circulatory and respiratory systems: Secondary | ICD-10-CM

## 2016-02-18 DIAGNOSIS — R079 Chest pain, unspecified: Secondary | ICD-10-CM | POA: Insufficient documentation

## 2016-02-18 DIAGNOSIS — R0609 Other forms of dyspnea: Secondary | ICD-10-CM | POA: Insufficient documentation

## 2016-02-18 LAB — MYOCARDIAL PERFUSION IMAGING
CHL CUP NUCLEAR SRS: 0
CSEPPHR: 77 {beats}/min
LV dias vol: 141 mL (ref 62–150)
LVSYSVOL: 66 mL
Rest HR: 52 {beats}/min
SDS: 0
SSS: 0
TID: 1.16

## 2016-02-18 MED ORDER — TECHNETIUM TC 99M TETROFOSMIN IV KIT
9.4000 | PACK | Freq: Once | INTRAVENOUS | Status: AC | PRN
  Administered 2016-02-18: 9.4 via INTRAVENOUS
  Filled 2016-02-18: qty 9

## 2016-02-18 MED ORDER — REGADENOSON 0.4 MG/5ML IV SOLN
0.4000 mg | Freq: Once | INTRAVENOUS | Status: AC
  Administered 2016-02-18: 0.4 mg via INTRAVENOUS

## 2016-02-18 MED ORDER — TECHNETIUM TC 99M TETROFOSMIN IV KIT
31.0000 | PACK | Freq: Once | INTRAVENOUS | Status: AC | PRN
  Administered 2016-02-18: 31 via INTRAVENOUS
  Filled 2016-02-18: qty 31

## 2016-02-18 MED ORDER — AMINOPHYLLINE 25 MG/ML IV SOLN
75.0000 mg | Freq: Once | INTRAVENOUS | Status: AC
  Administered 2016-02-18: 75 mg via INTRAVENOUS

## 2016-02-20 ENCOUNTER — Ambulatory Visit (HOSPITAL_COMMUNITY)
Admission: RE | Admit: 2016-02-20 | Discharge: 2016-02-20 | Disposition: A | Discharge status: Home / Self Care | Payer: Medicare Other | Source: Ambulatory Visit | Attending: Cardiology | Admitting: Cardiology

## 2016-02-20 DIAGNOSIS — E785 Hyperlipidemia, unspecified: Secondary | ICD-10-CM | POA: Insufficient documentation

## 2016-02-20 DIAGNOSIS — R938 Abnormal findings on diagnostic imaging of other specified body structures: Secondary | ICD-10-CM | POA: Insufficient documentation

## 2016-02-20 DIAGNOSIS — R0989 Other specified symptoms and signs involving the circulatory and respiratory systems: Secondary | ICD-10-CM | POA: Insufficient documentation

## 2016-02-20 DIAGNOSIS — I10 Essential (primary) hypertension: Secondary | ICD-10-CM | POA: Insufficient documentation

## 2016-02-20 DIAGNOSIS — I2510 Atherosclerotic heart disease of native coronary artery without angina pectoris: Secondary | ICD-10-CM | POA: Insufficient documentation

## 2016-04-01 ENCOUNTER — Encounter: Payer: Self-pay | Admitting: Cardiology

## 2016-04-12 NOTE — Progress Notes (Signed)
HPI The patient presents for followup of his known coronary disease  In 2012 he did have an abnormal stress test followed by catheterization which demonstrated patent bypass grafts.  I saw him in June after an ED visit in [place] for chest pain.  He also had syncope and bradycardia.  His beta blocker was stopped in the ED.  I ordered a perfusion study that was negative for ischemia.  Since I last saw him he has done well.  The patient denies any new symptoms such as chest discomfort, neck or arm discomfort. There has been no new shortness of breath, PND or orthopnea. There have been no reported palpitations, presyncope or syncope.  He does have fatigue but no further events such as that he had in June.   He has five pigs.    No Known Allergies  Current Outpatient Prescriptions  Medication Sig Dispense Refill  . amLODipine (NORVASC) 5 MG tablet Take 5 mg by mouth daily.    Marland Kitchen aspirin 81 MG tablet Take 1 tablet (81 mg total) by mouth daily. 30 tablet   . atorvastatin (LIPITOR) 10 MG tablet Take 10 mg by mouth at bedtime.     . enalapril (VASOTEC) 20 MG tablet Take 20 mg by mouth.     Marland Kitchen omeprazole (PRILOSEC) 20 MG capsule Take 20 mg by mouth daily.     . tamsulosin (FLOMAX) 0.4 MG CAPS capsule Take 0.4 mg by mouth daily.     No current facility-administered medications for this visit.     Past Medical History:  Diagnosis Date  . Coronary artery disease    a.  s/p CABG;   b. cath 4/12: EF 55%, 3vCAD, patent L-LAD, patent S-RCA, patent S-CFX (done after a false pos. ETT)  . Diverticular disease   . GI bleed   . Hemorrhoids   . HH (hiatus hernia)   . Hypertension   . Osteoarthritis   . Other and unspecified hyperlipidemia   . Schatzki's ring   . Stroke [facility])     Past Surgical History:  Procedure Laterality Date  . ARTERIOVENOUS GRAFT PLACEMENT W/ ENDOSCOPIC VEIN HARVEST     of the right leg greater spahenous vein. Surgeon: Tharon Aquas Trigt,M.D.  . COLONOSCOPY  02/24/2010   Hemorrhoids,  Diverticulosis. Performed at [place]. Normal terminal ileum. Dr. June Leap, [place] GRAFT  06/21/2007   CABG x 3 Surgeon Ivin Poot, MD  . hip replace  06/09/2004   left hip Surgeon Pietro Cassis. Alvan Dame, MD    ROS:    As stated in the HPI and negative for all other systems.  PHYSICAL EXAM BP (!) 158/73   Pulse 86   Ht 5\' 8"  (1.727 m)   Wt 170 lb (77.1 kg)   BMI 25.85 kg/m  GENERAL:  Well appearing HEENT:  Pupils equal round and reactive, fundi not visualized, oral mucosa unremarkable, poor dentition NECK:  No jugular venous distention, waveform within normal limits, carotid upstroke brisk and symmetric, soft bilateral bruits, no thyromegaly LUNGS:  Clear to auscultation bilaterally BACK:  No CVA tenderness CHEST:  Well healed sternotomy scar. HEART:  PMI not displaced or sustained,S1 and S2 within normal limits, no S3, no S4, no clicks, no rubs, 2 out of 6 apical early peaking systolic murmur radiating out the aortic tract, no diastolic murmurs ABD:  Flat, positive bowel sounds normal in frequency in pitch, no bruits, no rebound, no guarding, no midline pulsatile mass, no hepatomegaly, no splenomegaly EXT:  2 plus pulses upper and decreased DP/PT bilateral, no edema, no cyanosis no clubbing   ASSESSMENT AND PLAN  CAD:  He denies any new symptoms since his Lexiscan Myoview.  No change in therapy is planned.   HTN:  His blood pressure is mildly elevated.  He will continue on the meds as listed.  He does not want it to be any lower because he does not feel good when it is below 140.    HYPERLIPIDEMIA:   His LDL was 55 in March.  He will continue on the meds as listed.  BRADYCARDIA:  He doesn't seem to have any symptoms. No change in therapy is planned.    DECREASED PEDAL PULSES:  ABIs this year did not demonstrate significant disease.

## 2016-04-15 ENCOUNTER — Encounter: Payer: Self-pay | Admitting: Cardiology

## 2016-04-15 ENCOUNTER — Encounter (INDEPENDENT_AMBULATORY_CARE_PROVIDER_SITE_OTHER): Payer: Self-pay

## 2016-04-15 ENCOUNTER — Ambulatory Visit (INDEPENDENT_AMBULATORY_CARE_PROVIDER_SITE_OTHER): Payer: Medicare Other | Admitting: Cardiology

## 2016-04-15 VITALS — BP 158/73 | HR 86 | Ht 68.0 in | Wt 170.0 lb

## 2016-04-15 DIAGNOSIS — I2510 Atherosclerotic heart disease of native coronary artery without angina pectoris: Secondary | ICD-10-CM

## 2016-04-15 NOTE — Patient Instructions (Signed)
Medication Instructions:  Continue current medication therapy  Labwork: NONE ORDERED  Testing/Procedures: NONE ORDERED  Follow-Up: Your physician wants you to follow-up in: 1 Year. You will receive a reminder letter in the mail two months in advance. If you don't receive a letter, please call our office to schedule the follow-up appointment.   Any Other Special Instructions Will Be Listed Below (If Applicable).   If you need a refill on your cardiac medications before your next appointment, please call your pharmacy.

## 2016-04-16 ENCOUNTER — Encounter: Payer: Self-pay | Admitting: Cardiology

## 2016-07-09 ENCOUNTER — Telehealth: Payer: Self-pay | Admitting: *Deleted

## 2016-07-09 NOTE — Telephone Encounter (Signed)
Requesting surgical clearance:   1. Type of surgery: Lumbar Surgery  2. Surgeon: Dr Izell [place]  3. Surgical date: 08/04/16  4. Medications that need to be help: Aspirin  5. [facility]- (P) [phone]  (F) [phone]   Pt saw you last on August 24th, Is pt cleared for surgery and how long Aspirin need to be held.

## 2016-07-12 ENCOUNTER — Telehealth: Payer: Self-pay | Admitting: Cardiology

## 2016-07-12 NOTE — Telephone Encounter (Signed)
New message  Pt needs surgical clearance  1. Back surgery 2. 12/13 3. No meds have been discussed 4. Dr. Jimmye Norman @ [facility] 5. [phone]

## 2016-07-12 NOTE — Telephone Encounter (Signed)
Request for surgical clearance:  1. What type of surgery is being performed? L3-5 decompression & L4-5 fusion (possible L3-5 fusion)  2. When is this surgery scheduled? 08/04/16  3. Are there any medications that need to be held prior to surgery and how long? ASA  4. Name of physician performing surgery? Dr. Jimmye Norman  5. What is your office phone and fax number? Ph: [phone] Fax: [phone]

## 2016-07-14 NOTE — Telephone Encounter (Signed)
He had a negative perfusion study this year and no new symptoms since then.  Therefore, based on ACC/AHA guidelines, the patient would be at acceptable risk for the planned procedure without further cardiovascular testing.  He can hold his ASA x 5 days prior to his surgery.

## 2016-07-19 NOTE — Telephone Encounter (Signed)
Clearance faxed via Epic and faxed Machine 

## 2016-08-04 DIAGNOSIS — M48062 Spinal stenosis, lumbar region with neurogenic claudication: Secondary | ICD-10-CM | POA: Insufficient documentation

## 2016-11-25 DIAGNOSIS — Z62822 Parent-foster child conflict: Secondary | ICD-10-CM | POA: Insufficient documentation

## 2016-12-21 DIAGNOSIS — Z9181 History of falling: Secondary | ICD-10-CM | POA: Insufficient documentation

## 2017-02-03 ENCOUNTER — Encounter: Payer: Self-pay | Admitting: Cardiology

## 2017-02-03 ENCOUNTER — Ambulatory Visit (INDEPENDENT_AMBULATORY_CARE_PROVIDER_SITE_OTHER): Payer: Medicare Other | Admitting: Cardiology

## 2017-02-03 VITALS — BP 140/74 | HR 86 | Ht 69.0 in | Wt 166.0 lb

## 2017-02-03 DIAGNOSIS — I2510 Atherosclerotic heart disease of native coronary artery without angina pectoris: Secondary | ICD-10-CM | POA: Diagnosis not present

## 2017-02-03 DIAGNOSIS — I10 Essential (primary) hypertension: Secondary | ICD-10-CM | POA: Diagnosis not present

## 2017-02-03 DIAGNOSIS — E785 Hyperlipidemia, unspecified: Secondary | ICD-10-CM | POA: Diagnosis not present

## 2017-02-03 NOTE — Patient Instructions (Signed)
Medication Instructions:  Your physician recommends that you continue on your current medications as directed. Please refer to the Current Medication list given to you today.  Follow-Up: Your physician wants you to follow-up in: 1 year with Dr. Hochrein. You will receive a reminder letter in the mail two months in advance. If you don't receive a letter, please call our office to schedule the follow-up appointment.   Any Other Special Instructions Will Be Listed Below (If Applicable).     If you need a refill on your cardiac medications before your next appointment, please call your pharmacy.   

## 2017-02-03 NOTE — Progress Notes (Signed)
HPI The patient presents for followup of his known coronary disease  In 2012 he did have an abnormal stress test followed by catheterization which demonstrated patent bypass grafts.  His last stress test last year was unremarkable.  Since I last saw him he has had back surgery and has done well with this.  He now has more than 10 pigs.  He completed PT.  He is doing well.  The patient denies any new symptoms such as chest discomfort, neck or arm discomfort. There has been no new shortness of breath, PND or orthopnea. There have been no reported palpitations, presyncope or syncope.  He did get dizzy with squatting recently.     No Known Allergies  Current Outpatient Prescriptions  Medication Sig Dispense Refill  . amLODipine (NORVASC) 5 MG tablet Take 5 mg by mouth daily.    Marland Kitchen aspirin 81 MG tablet Take 1 tablet (81 mg total) by mouth daily. 30 tablet   . atorvastatin (LIPITOR) 10 MG tablet Take 10 mg by mouth at bedtime.     Marland Kitchen doxycycline (VIBRAMYCIN) 100 MG capsule Take 100 mg by mouth 2 (two) times daily.    . enalapril (VASOTEC) 20 MG tablet Take 20 mg by mouth.     . furosemide (LASIX) 20 MG tablet Take 20 mg by mouth as needed.    Marland Kitchen omeprazole (PRILOSEC) 20 MG capsule Take 20 mg by mouth daily.     . tamsulosin (FLOMAX) 0.4 MG CAPS capsule Take 0.4 mg by mouth daily.     No current facility-administered medications for this visit.     Past Medical History:  Diagnosis Date  . Coronary artery disease    a.  s/p CABG;   b. cath 4/12: EF 55%, 3vCAD, patent L-LAD, patent S-RCA, patent S-CFX (done after a false pos. ETT)  . Diverticular disease   . GI bleed   . Hemorrhoids   . HH (hiatus hernia)   . Hypertension   . Osteoarthritis   . Other and unspecified hyperlipidemia   . Schatzki's ring   . Stroke [facility])     Past Surgical History:  Procedure Laterality Date  . ARTERIOVENOUS GRAFT PLACEMENT W/ ENDOSCOPIC VEIN HARVEST     of the right leg greater spahenous vein. Surgeon: Tharon Aquas Trigt,M.D.  . COLONOSCOPY  02/24/2010   Hemorrhoids, Diverticulosis. Performed at [place]. Normal terminal ileum. Dr. June Leap, [place] GRAFT  06/21/2007   CABG x 3 Surgeon Ivin Poot, MD  . hip replace  06/09/2004   left hip Surgeon Pietro Cassis. Alvan Dame, MD    ROS:    As stated in the HPI and negative for all other systems.  PHYSICAL EXAM BP 140/74   Pulse 86   Ht 5\' 9"  (1.753 m)   Wt 166 lb (75.3 kg)   BMI 24.51 kg/m   GENERAL:  Well appearing NECK:  No jugular venous distention, waveform within normal limits, carotid upstroke brisk and symmetric, no bruits, no thyromegaly LUNGS:  Clear to auscultation bilaterally CHEST:  Well healed sternotomy scar. HEART:  PMI not displaced or sustained,S1 and S2 within normal limits, no S3, no S4, no clicks, no rubs, 2/6 apical systolic murmur, no diastolic murmurs ABD:  Flat, positive bowel sounds normal in frequency in pitch, no bruits, no rebound, no guarding, no midline pulsatile mass, no hepatomegaly, no splenomegaly EXT:  2 plus pulses upper and decreased DP/PT bilateral, no edema, no cyanosis no clubbing  EKG:  Sinus  rhythm, rate 86, axis within normal limits, intervals within normal limits, rare premature ectopic complexes, no acute ST-T wave changes.  ASSESSMENT AND PLAN  CAD:  He denies any new symptoms since his [place].  No change in therapy is planned.  HTN:  The blood pressure is at target. No change in medications is indicated. We will continue with therapeutic lifestyle changes (TLC).  HYPERLIPIDEMIA:   I do not see a recent lipid profile.  I will defer to Lacie Draft, NP  BRADYCARDIA:  He has no symptoms with this and it is improved off of beta blocker.   DECREASED PEDAL PULSES:    He has no symptoms related to this.

## 2017-09-30 ENCOUNTER — Other Ambulatory Visit: Payer: Self-pay

## 2017-09-30 ENCOUNTER — Encounter (HOSPITAL_COMMUNITY): Payer: Self-pay

## 2017-09-30 ENCOUNTER — Emergency Department (HOSPITAL_COMMUNITY): Payer: Medicare Other

## 2017-09-30 ENCOUNTER — Inpatient Hospital Stay (HOSPITAL_COMMUNITY)
Admission: EM | Admit: 2017-09-30 | Discharge: 2017-10-04 | DRG: 244 | Disposition: A | Discharge status: Home / Self Care | Payer: Medicare Other | Attending: Cardiology | Admitting: Cardiology

## 2017-09-30 DIAGNOSIS — Z8249 Family history of ischemic heart disease and other diseases of the circulatory system: Secondary | ICD-10-CM

## 2017-09-30 DIAGNOSIS — Z8673 Personal history of transient ischemic attack (TIA), and cerebral infarction without residual deficits: Secondary | ICD-10-CM

## 2017-09-30 DIAGNOSIS — R079 Chest pain, unspecified: Secondary | ICD-10-CM | POA: Diagnosis not present

## 2017-09-30 DIAGNOSIS — Z833 Family history of diabetes mellitus: Secondary | ICD-10-CM | POA: Diagnosis not present

## 2017-09-30 DIAGNOSIS — I441 Atrioventricular block, second degree: Secondary | ICD-10-CM | POA: Diagnosis not present

## 2017-09-30 DIAGNOSIS — E785 Hyperlipidemia, unspecified: Secondary | ICD-10-CM | POA: Diagnosis not present

## 2017-09-30 DIAGNOSIS — Z951 Presence of aortocoronary bypass graft: Secondary | ICD-10-CM | POA: Diagnosis not present

## 2017-09-30 DIAGNOSIS — Z7982 Long term (current) use of aspirin: Secondary | ICD-10-CM | POA: Diagnosis not present

## 2017-09-30 DIAGNOSIS — R0602 Shortness of breath: Secondary | ICD-10-CM | POA: Diagnosis not present

## 2017-09-30 DIAGNOSIS — I442 Atrioventricular block, complete: Secondary | ICD-10-CM | POA: Diagnosis not present

## 2017-09-30 DIAGNOSIS — I10 Essential (primary) hypertension: Secondary | ICD-10-CM | POA: Diagnosis not present

## 2017-09-30 DIAGNOSIS — I2510 Atherosclerotic heart disease of native coronary artery without angina pectoris: Secondary | ICD-10-CM

## 2017-09-30 DIAGNOSIS — Z79899 Other long term (current) drug therapy: Secondary | ICD-10-CM

## 2017-09-30 DIAGNOSIS — Z955 Presence of coronary angioplasty implant and graft: Secondary | ICD-10-CM | POA: Diagnosis not present

## 2017-09-30 DIAGNOSIS — E119 Type 2 diabetes mellitus without complications: Secondary | ICD-10-CM | POA: Diagnosis not present

## 2017-09-30 DIAGNOSIS — Z950 Presence of cardiac pacemaker: Secondary | ICD-10-CM

## 2017-09-30 DIAGNOSIS — I361 Nonrheumatic tricuspid (valve) insufficiency: Secondary | ICD-10-CM | POA: Diagnosis not present

## 2017-09-30 DIAGNOSIS — E7800 Pure hypercholesterolemia, unspecified: Secondary | ICD-10-CM | POA: Diagnosis not present

## 2017-09-30 DIAGNOSIS — R51 Headache: Secondary | ICD-10-CM | POA: Diagnosis not present

## 2017-09-30 LAB — COMPREHENSIVE METABOLIC PANEL
ALBUMIN: 3.3 g/dL — AB (ref 3.5–5.0)
ALT: 13 U/L — AB (ref 17–63)
AST: 17 U/L (ref 15–41)
Alkaline Phosphatase: 47 U/L (ref 38–126)
Anion gap: 10 (ref 5–15)
BUN: 10 mg/dL (ref 6–20)
CHLORIDE: 107 mmol/L (ref 101–111)
CO2: 24 mmol/L (ref 22–32)
CREATININE: 0.88 mg/dL (ref 0.61–1.24)
Calcium: 8.7 mg/dL — ABNORMAL LOW (ref 8.9–10.3)
GFR calc Af Amer: >60 mL/min (ref >60)
GLUCOSE: 110 mg/dL — AB (ref 65–99)
POTASSIUM: 3.7 mmol/L (ref 3.5–5.1)
Sodium: 141 mmol/L (ref 135–145)
Total Bilirubin: 0.8 mg/dL (ref 0.3–1.2)
Total Protein: 5.8 g/dL — ABNORMAL LOW (ref 6.5–8.1)

## 2017-09-30 LAB — I-STAT TROPONIN, ED: TROPONIN I, POC: 0 ng/mL (ref 0.00–0.08)

## 2017-09-30 LAB — BRAIN NATRIURETIC PEPTIDE: B Natriuretic Peptide: 522 pg/mL — ABNORMAL HIGH (ref 0.0–100.0)

## 2017-09-30 LAB — CBC
HEMATOCRIT: 33.7 % — AB (ref 39.0–52.0)
HEMATOCRIT: 32 % — AB (ref 39.0–52.0)
Hemoglobin: 10.8 g/dL — ABNORMAL LOW (ref 13.0–17.0)
Hemoglobin: 10.3 g/dL — ABNORMAL LOW (ref 13.0–17.0)
MCH: 30.9 pg (ref 26.0–34.0)
MCH: 30.7 pg (ref 26.0–34.0)
MCHC: 32 g/dL (ref 30.0–36.0)
MCHC: 32.2 g/dL (ref 30.0–36.0)
MCV: 96.3 fL (ref 78.0–100.0)
MCV: 95.5 fL (ref 78.0–100.0)
PLATELETS: 266 10*3/uL (ref 150–400)
Platelets: 286 10*3/uL (ref 150–400)
RBC: 3.5 MIL/uL — ABNORMAL LOW (ref 4.22–5.81)
RBC: 3.35 MIL/uL — AB (ref 4.22–5.81)
RDW: 13.8 % (ref 11.5–15.5)
RDW: 13.8 % (ref 11.5–15.5)
WBC: 6.3 10*3/uL (ref 4.0–10.5)
WBC: 5.6 10*3/uL (ref 4.0–10.5)

## 2017-09-30 LAB — BASIC METABOLIC PANEL
Anion gap: 11 (ref 5–15)
BUN: 10 mg/dL (ref 6–20)
CALCIUM: 8.8 mg/dL — AB (ref 8.9–10.3)
CO2: 23 mmol/L (ref 22–32)
Chloride: 107 mmol/L (ref 101–111)
Creatinine, Ser: 0.89 mg/dL (ref 0.61–1.24)
GFR calc Af Amer: >60 mL/min (ref >60)
GLUCOSE: 118 mg/dL — AB (ref 65–99)
Potassium: 3.8 mmol/L (ref 3.5–5.1)
Sodium: 141 mmol/L (ref 135–145)

## 2017-09-30 LAB — TSH: TSH: 1.165 u[IU]/mL (ref 0.350–4.500)

## 2017-09-30 LAB — MAGNESIUM
MAGNESIUM: 2 mg/dL (ref 1.7–2.4)
Magnesium: 2.1 mg/dL (ref 1.7–2.4)

## 2017-09-30 LAB — TROPONIN I: Troponin I: <0.03 ng/mL (ref <0.03)

## 2017-09-30 MED ORDER — ASPIRIN 81 MG PO CHEW
324.0000 mg | CHEWABLE_TABLET | ORAL | Status: AC
  Filled 2017-09-30: qty 4

## 2017-09-30 MED ORDER — ASPIRIN 300 MG RE SUPP: 300.0000 mg | RECTAL | Status: AC

## 2017-09-30 MED ORDER — ONDANSETRON HCL 4 MG/2ML IJ SOLN: 4.0000 mg | Freq: Four times a day (QID) | INTRAMUSCULAR | Status: DC | PRN

## 2017-09-30 MED ORDER — ASPIRIN EC 81 MG PO TBEC: 81.0000 mg | DELAYED_RELEASE_TABLET | Freq: Every day | ORAL | Status: DC

## 2017-09-30 MED ORDER — NITROGLYCERIN 0.4 MG SL SUBL: 0.4000 mg | SUBLINGUAL_TABLET | SUBLINGUAL | Status: DC | PRN

## 2017-09-30 MED ORDER — PANTOPRAZOLE SODIUM 40 MG PO TBEC
40.0000 mg | DELAYED_RELEASE_TABLET | Freq: Every day | ORAL | Status: DC
  Administered 2017-10-01 – 2017-10-04 (×4): 40 mg via ORAL
  Filled 2017-09-30 (×4): qty 1

## 2017-09-30 MED ORDER — ASPIRIN EC 81 MG PO TBEC
81.0000 mg | DELAYED_RELEASE_TABLET | Freq: Every day | ORAL | Status: DC
  Administered 2017-10-01 – 2017-10-04 (×4): 81 mg via ORAL
  Filled 2017-09-30 (×4): qty 1

## 2017-09-30 MED ORDER — ENALAPRIL MALEATE 10 MG PO TABS
20.0000 mg | ORAL_TABLET | Freq: Every day | ORAL | Status: DC
  Administered 2017-10-01 – 2017-10-04 (×4): 20 mg via ORAL
  Filled 2017-09-30 (×4): qty 2

## 2017-09-30 MED ORDER — ACETAMINOPHEN 325 MG PO TABS
650.0000 mg | ORAL_TABLET | ORAL | Status: DC | PRN
  Administered 2017-10-03: 650 mg via ORAL
  Filled 2017-09-30: qty 2

## 2017-09-30 MED ORDER — HEPARIN SODIUM (PORCINE) 5000 UNIT/ML IJ SOLN
5000.0000 [IU] | Freq: Three times a day (TID) | INTRAMUSCULAR | Status: DC
  Administered 2017-09-30 – 2017-10-04 (×10): 5000 [IU] via SUBCUTANEOUS
  Filled 2017-09-30 (×10): qty 1

## 2017-09-30 MED ORDER — ATORVASTATIN CALCIUM 10 MG PO TABS
10.0000 mg | ORAL_TABLET | Freq: Every day | ORAL | Status: DC
  Administered 2017-09-30 – 2017-10-03 (×4): 10 mg via ORAL
  Filled 2017-09-30 (×4): qty 1

## 2017-09-30 NOTE — ED Notes (Signed)
Heart healthy tray ordered 

## 2017-09-30 NOTE — ED Provider Notes (Signed)
[facility] EMERGENCY DEPARTMENT Provider Note  CSN: [id number] Arrival date & time: 09/30/17 1410  Chief Complaint(s) Chest Pain  HPI Blake Burgess is a 84 y.o. male with a history of hypertension, diabetes, CAD status post stenting and CABG with a recent echo performed at [facility] which revealed mild to moderate aortic stenosis.  He presents today for 2 weeks of gradually worsening dyspnea on exertion with intermittent substernal chest discomfort that is nonradiating and nonexertional.  Chest pain is brief lasting only seconds to minutes.  Self resolves.  No associated diaphoresis or nausea.  He denies any recent fevers but does endorse several weeks of congestion and nonproductive cough.  No vomiting, diarrhea, abdominal pain.  No urinary symptoms.  No peripheral edema.  Patient was  Seen several times since the beginning of February for this chest pain and shortness of breath.  He was noted to have a new murmur by his primary care provider who ordered an echocardiogram revealing the mild to moderate aortic stenosis.  There is no mention of ejection fracture in the node itself and I do not have access to the results on care everywhere.  Last ejection fracture that we have was from a Lexiscan stress test performed in June 2017 revealing an ejection fracture of 55-65%.  HPI  Past Medical History Past Medical History:  Diagnosis Date  . Coronary artery disease    a.  s/p CABG;   b. cath 4/12: EF 55%, 3vCAD, patent L-LAD, patent S-RCA, patent S-CFX (done after a false pos. ETT)  . Diverticular disease   . GI bleed   . Hemorrhoids   . HH (hiatus hernia)   . Hypertension   . Osteoarthritis   . Other and unspecified hyperlipidemia   . Schatzki's ring   . Stroke [facility])    Patient Active Problem List   Diagnosis Date Noted  . Chest pain, unspecified 12/14/2010  . Shortness of breath 12/14/2010  . FATIGUE 10/19/2010  . COUGH 08/07/2010  . ANEMIA, SECONDARY TO  ACUTE BLOOD LOSS 03/03/2010  . GI BLEED 03/03/2010  . OSTEOARTHRITIS 03/03/2010  . DIZZINESS 12/04/2009  . HYPERLIPIDEMIA 02/05/2009  . HYPERTENSION, BENIGN 02/05/2009  . CAD, NATIVE VESSEL 02/05/2009   Home Medication(s) Prior to Admission medications   Medication Sig Start Date End Date Taking? Authorizing Provider  aspirin 81 MG tablet Take 1 tablet (81 mg total) by mouth daily. 06/12/13   Kinnie Feil, MD  atorvastatin (LIPITOR) 10 MG tablet Take 10 mg by mouth at bedtime.     [provider]  doxycycline (VIBRAMYCIN) 100 MG capsule Take 100 mg by mouth 2 (two) times daily.    [provider]  enalapril (VASOTEC) 20 MG tablet Take 20 mg by mouth.     [provider]  furosemide (LASIX) 20 MG tablet Take 20 mg by mouth as needed.    [provider]  omeprazole (PRILOSEC) 20 MG capsule Take 20 mg by mouth daily.     [provider]  tamsulosin (FLOMAX) 0.4 MG CAPS capsule Take 0.4 mg by mouth daily. 02/02/16   [provider]  Past Surgical History Past Surgical History:  Procedure Laterality Date  . ARTERIOVENOUS GRAFT PLACEMENT W/ ENDOSCOPIC VEIN HARVEST     of the right leg greater spahenous vein. Surgeon: Tharon Aquas Trigt,M.D.  . COLONOSCOPY  02/24/2010   Hemorrhoids, Diverticulosis. Performed at [place]. Normal terminal ileum. Dr. June Leap, [place] GRAFT  06/21/2007   CABG x 3 Surgeon Ivin Poot, MD  . hip replace  06/09/2004   left hip Surgeon Pietro Cassis. Alvan Dame, MD   Family History Family History  Problem Relation Age of Onset  . Heart attack Mother   . Hypertension Mother   . Diabetes Father   . Diabetes Brother   . Diabetes Sister     Social History Social History   Tobacco Use  . Smoking status: Never Smoker  . Smokeless tobacco: Never Used    Substance Use Topics  . Alcohol use: No  . Drug use: No   Allergies Patient has no known allergies.  Review of Systems Review of Systems All other systems are reviewed and are negative for acute change except as noted in the HPI  Physical Exam Vital Signs  I have reviewed the triage vital signs BP (!) 190/58   Pulse 76   Temp 98.1 F (36.7 C)   Resp 18   Wt 75.3 kg (166 lb)   SpO2 100%   BMI 24.51 kg/m   Physical Exam  Constitutional: He is oriented to person, place, and time. He appears well-developed and well-nourished. No distress.  HENT:  Head: Normocephalic and atraumatic.  Nose: Nose normal.  Eyes: Conjunctivae and EOM are normal. Pupils are equal, round, and reactive to light. Right eye exhibits no discharge. Left eye exhibits no discharge. No scleral icterus.  Neck: Normal range of motion. Neck supple.  Cardiovascular: A regularly irregular rhythm present. Bradycardia present. Exam reveals no gallop and no friction rub.  Murmur heard.  Systolic murmur is present with a grade of 3/6. Pulses:      Radial pulses are 2+ on the right side, and 2+ on the left side.  Pulmonary/Chest: Effort normal and breath sounds normal. No stridor. No respiratory distress. He has no rales.  Abdominal: Soft. He exhibits no distension. There is no tenderness.  Musculoskeletal: He exhibits no edema or tenderness.  Neurological: He is alert and oriented to person, place, and time.  Skin: Skin is warm and dry. No rash noted. He is not diaphoretic. No erythema.  Psychiatric: He has a normal mood and affect.  Vitals reviewed.   ED Results and Treatments Labs (all labs ordered are listed, but only abnormal results are displayed) Labs Reviewed  BASIC METABOLIC PANEL - Abnormal; Notable for the following components:      Result Value   Glucose, Bld 118 (*)    Calcium 8.8 (*)    All other components within normal limits  CBC - Abnormal; Notable for the following components:   RBC  3.50 (*)    Hemoglobin 10.8 (*)    HCT 33.7 (*)    All other components within normal limits  MAGNESIUM  BRAIN NATRIURETIC PEPTIDE  I-STAT TROPONIN, ED  EKG  EKG Interpretation  Date/Time:  Friday September 30 2017 14:18:53 EST Ventricular Rate:  57 PR Interval:    QRS Duration: 100 QT Interval:  472 QTC Calculation: 459 R Axis:   91 Text Interpretation:   Critical Test Result: AV Block possible 3rd degree block vs Sinus bradycardia with 2nd degree A-V block (Mobitz I) with 2:1 A-V conduction with occasional Premature ventricular complexes in a pattern of bigeminy   Rightward axis Septal infarct , age undetermined Abnormal ECG changed from prior ecg Reconfirmed by Addison Lank [phone]) on 09/30/2017 3:47:42 PM      Radiology Dg Chest Port 1 View  Result Date: 09/30/2017 CLINICAL DATA:  Chest pain short of breath EXAM: PORTABLE CHEST 1 VIEW COMPARISON:  09/28/2017 FINDINGS: Postop CABG. Negative for heart failure. Lungs are clear without infiltrate or effusion. No interval change from the prior study. IMPRESSION: No active disease. Electronically Signed   By: Franchot Gallo M.D.   On: 09/30/2017 15:18   Pertinent labs & imaging results that were available during my care of the patient were reviewed by me and considered in my medical decision making (see chart for details).  Medications Ordered in ED Medications - No data to display                                                                                                                                  Procedures Procedures CRITICAL CARE Performed by: Grayce Sessions Keene Gilkey Total critical care time: 45 minutes Critical care time was exclusive of separately billable procedures and treating other patients. Critical care was necessary to treat or prevent imminent or life-threatening deterioration. Critical care  was time spent personally by me on the following activities: development of treatment plan with patient and/or surrogate as well as nursing, discussions with consultants, evaluation of patient's response to treatment, examination of patient, obtaining history from patient or surrogate, ordering and performing treatments and interventions, ordering and review of laboratory studies, ordering and review of radiographic studies, pulse oximetry and re-evaluation of patient's condition.   (including critical care time)  Medical Decision Making / ED Course I have reviewed the nursing notes for this encounter and the patient's prior records (if available in EHR or on provided paperwork).    EKG concerning for 3rd block with junctional beat vs 2nd block with abbarent conduction. HDS at this time. Pads applied. Screening labs obtained.   Chest x-ray without evidence suggestive of pneumonia, pneumothorax, pneumomediastinum.  No abnormal contour of the mediastinum to suggest dissection. No evidence of acute injuries.  Consulting cardiology.  5:21 PM Cardiology at bedside and agreed with 3rd degree block. Will admit for further management.   Final Clinical Impression(s) / ED Diagnoses Final diagnoses:  Complete heart block ([place])      This chart was dictated using voice recognition software.  Despite best efforts to proofread,  errors can occur which can change the documentation meaning.  Fatima Blank, MD 09/30/17 1807

## 2017-09-30 NOTE — ED Provider Notes (Signed)
MSE was initiated and I personally evaluated the patient and placed orders (if any) at  2:39 PM on September 30, 2017.  Patient is here with exertional shortness of breath and mild chest discomfort.  Symptom has been ongoing for the past 2 weeks.  Report having some mild chest discomfort earlier today lasting for less than 10 minutes and improves with aspirin.  Currently chest pain-free.  Was seen at an outside hospital twice for the past 2 days for the same complaint.  Was told that his workup has been unremarkable.  No active complaint at this time.  Heart is regular with systolic murmur, Lungs CTAB, abd soft and non tender, no peripheral edema.  The patient appears stable so that the remainder of the MSE may be completed by another provider.   Domenic Moras, PA-C 09/30/17 1442    Jola Schmidt, MD 10/01/17 1027

## 2017-09-30 NOTE — ED Triage Notes (Signed)
Pt presents to the ed with complaints of chest pain that is worse with exertion and decreases with rest.  He was seen yesterday at [facility] for the same and discharged. Pt is pain free at this time and received 324 of ASA PTA

## 2017-09-30 NOTE — H&P (Addendum)
Admit date: 09/30/2017 Referring Physician: Dr. Leonette Monarch Primary Cardiologist: none Chief complaint/reason for admission: chest pain, SOB and heart block  HPI: Blake Burgess is a 84 y.o. male who is being seen today for the evaluation of heart block and chest pain at the request of Dr. Leonette Monarch.  This is an 84yo male with a history of ASCAD s/p CABG and last cath 2012 with patent LIMA to LAD, SVG to RCA and SVG to LCx (done for false positive ETT), hiatal hernia, HTN, CVA and Schatzki's ring.  He had an echo done at [facility] recently showing mild to moderate AS.  This was done after his PCP noted a new murmur.  He had a normal nuclear stress test in June 2017.  He has been having SOB and mild chest pain off and on for 2 weeks.  He went to ER at [facility] twice in the past 2 days with similar complains and workup was normal and he was sent home. This am he had CP lasting about 10 minutes and took ASA with improvement in his sx.  He says that the CP is exertional and improves with rest as well as NTG.  He says that the pain only lasts a few minutes and usually resolves on its own.    In ER today chest xray is normal.  EKG shows NSR with PVCs and intermittent heart block.  Initial POC trop 0.  He currently denies any CP or SOB.  He says that he has felt weak and fatigued for several weeks with dizziness but no syncope.  PMH:    Past Medical History:  Diagnosis Date  . Coronary artery disease    a.  s/p CABG;   b. cath 4/12: EF 55%, 3vCAD, patent L-LAD, patent S-RCA, patent S-CFX (done after a false pos. ETT)  . Diverticular disease   . GI bleed   . Hemorrhoids   . HH (hiatus hernia)   . Hypertension   . Osteoarthritis   . Other and unspecified hyperlipidemia   . Schatzki's ring   . Stroke [facility])     PSH:    Past Surgical History:  Procedure Laterality Date  . ARTERIOVENOUS GRAFT PLACEMENT W/ ENDOSCOPIC VEIN HARVEST     of the right leg greater spahenous vein. Surgeon: Tharon Aquas Trigt,M.D.   . COLONOSCOPY  02/24/2010   Hemorrhoids, Diverticulosis. Performed at [place]. Normal terminal ileum. Dr. June Leap, [place] GRAFT  06/21/2007   CABG x 3 Surgeon Ivin Poot, MD  . hip replace  06/09/2004   left hip Surgeon Pietro Cassis. Alvan Dame, MD    ALLERGIES:   Patient has no known allergies.  Prior to Admit Meds:   (Not in a hospital admission) Family HX:    Family History  Problem Relation Age of Onset  . Heart attack Mother   . Hypertension Mother   . Diabetes Father   . Diabetes Brother   . Diabetes Sister    Social HX:    Social History   Socioeconomic History  . Marital status: Married    Spouse name: Not on file  . Number of children: 2  . Years of education: Not on file  . Highest education level: Not on file  Social Needs  . Financial resource strain: Not on file  . Food insecurity - worry: Not on file  . Food insecurity - inability: Not on file  . Transportation needs - medical: Not on file  .  Transportation needs - non-medical: Not on file  Occupational History    Employer: RETIRED  Tobacco Use  . Smoking status: Never Smoker  . Smokeless tobacco: Never Used  Substance and Sexual Activity  . Alcohol use: No  . Drug use: No  . Sexual activity: Not on file  Other Topics Concern  . Not on file  Social History Narrative   No Regular exercise. Daily Caffeine: 24 oz pepsi and 1 cup coffee.      ROS:  All ROS were addressed and are negative except what is stated in the HPI  PHYSICAL EXAM Vitals:   09/30/17 1615 09/30/17 1630  BP: (!) 181/62 (!) 168/66  Pulse: 65 (!) 58  Resp: 17 13  Temp:    SpO2: 99% 97%   General: Well developed, well nourished, in no acute distress Head: Eyes PERRLA, No xanthomas.   Normal cephalic and atramatic  Lungs:   Clear bilaterally to auscultation and percussion. Heart:   HRRR S1 S2 Pulses are 2+ & equal. 2/6 SM at RUSB to LLSB            No carotid bruit. No JVD.  No abdominal bruits. No  femoral bruits. Abdomen: Bowel sounds are positive, abdomen soft and non-tender without masses or                  Hernia's noted. Msk:  Back normal, normal gait. Normal strength and tone for age. Extremities:   No clubbing, cyanosis or edema.  DP +1 Neuro: Alert and oriented X 3. Psych:  Good affect, responds appropriately   Labs:   Lab Results  Component Value Date   WBC 6.3 09/30/2017   HGB 10.8 (L) 09/30/2017   HCT 33.7 (L) 09/30/2017   MCV 96.3 09/30/2017   PLT 286 09/30/2017   Recent Labs  Lab 09/30/17 1440  NA 141  K 3.8  CL 107  CO2 23  BUN 10  CREATININE 0.89  CALCIUM 8.8*  GLUCOSE 118*   Lab Results  Component Value Date   CKTOTAL 64 06/17/2007   CKMB 2.2 06/17/2007   TROPONINI 0.04        NO INDICATION OF MYOCARDIAL INJURY. 06/17/2007   No results found for: PTT Lab Results  Component Value Date   INR 1.02 06/03/2013   INR 1.05 02/04/2012   INR 1.0 11/26/2010     Lab Results  Component Value Date   CHOL 176 09/11/2010   CHOL 105 02/05/2009   CHOL 130 10/31/2007   Lab Results  Component Value Date   HDL 46.70 09/11/2010   HDL 43.70 02/05/2009   HDL 46.6 10/31/2007   Lab Results  Component Value Date   LDLCALC 106 (H) 09/11/2010   LDLCALC 53 02/05/2009   LDLCALC 59 10/31/2007   Lab Results  Component Value Date   TRIG 116.0 09/11/2010   TRIG 44.0 02/05/2009   TRIG 120 10/31/2007   Lab Results  Component Value Date   CHOLHDL 4 09/11/2010   CHOLHDL 2 02/05/2009   CHOLHDL 2.8 CALC 10/31/2007   No results found for: LDLDIRECT    Radiology:  Dg Chest Port 1 View  Result Date: 09/30/2017 CLINICAL DATA:  Chest pain short of breath EXAM: PORTABLE CHEST 1 VIEW COMPARISON:  09/28/2017 FINDINGS: Postop CABG. Negative for heart failure. Lungs are clear without infiltrate or effusion. No interval change from the prior study. IMPRESSION: No active disease. Electronically Signed   By: Franchot Gallo M.D.   On: 09/30/2017 15:18  Telemetry     NSR with PVCs and complete heart block with low junctional escape and PJCs- Personally Reviewed  ECG    NSR with PVCs and complete heart block with low junctional escape and PJCs - Personally Reviewed   ASSESSMENT/PLAN:   1.  Chest pain - this is exertional and associated with SOB.  EKG shows no acute ST changes and initial POC trop is 0.   - will admit to tele bed - cycle trop - Heparin per pharmacy for DVT prophylaxis - transition to IV heparin only if trop becomes positive - no BB due to heart block - repeat echo - echo a year ago with mild to moderate AS  2.  SOB - chest xray is clear.  No h/o fever, chills, cough - likely related to heart block - check BNP  3.  ASCAD s/p remote CABG with cath 2012 with patent grafts and nuclear stress test 2017 with no ischemia.  - continue ASA, statin  4.  HTN - Bp elevated - continue ACE I - may need additional BP meds but avoid AVN blocking agents  5.  Hyperlipidemia with LDL goal < 70 - check FLP and ALT in am  6.  Complete heart block - he is on no rate slowing or AVN blocking agents - continue to follow on tele - check TSH - EP consult in am - continue Zoll pads  Fransico Him, MD  09/30/2017  5:00 PM

## 2017-10-01 ENCOUNTER — Inpatient Hospital Stay (HOSPITAL_COMMUNITY): Payer: Medicare Other

## 2017-10-01 DIAGNOSIS — I361 Nonrheumatic tricuspid (valve) insufficiency: Secondary | ICD-10-CM

## 2017-10-01 LAB — BASIC METABOLIC PANEL
ANION GAP: 11 (ref 5–15)
BUN: 9 mg/dL (ref 6–20)
CALCIUM: 8.7 mg/dL — AB (ref 8.9–10.3)
CO2: 24 mmol/L (ref 22–32)
CREATININE: 0.86 mg/dL (ref 0.61–1.24)
Chloride: 104 mmol/L (ref 101–111)
GFR calc Af Amer: >60 mL/min (ref >60)
GLUCOSE: 100 mg/dL — AB (ref 65–99)
Potassium: 3.6 mmol/L (ref 3.5–5.1)
Sodium: 139 mmol/L (ref 135–145)

## 2017-10-01 LAB — CBC
HEMATOCRIT: 33.3 % — AB (ref 39.0–52.0)
Hemoglobin: 10.5 g/dL — ABNORMAL LOW (ref 13.0–17.0)
MCH: 30.1 pg (ref 26.0–34.0)
MCHC: 31.5 g/dL (ref 30.0–36.0)
MCV: 95.4 fL (ref 78.0–100.0)
Platelets: 272 10*3/uL (ref 150–400)
RBC: 3.49 MIL/uL — ABNORMAL LOW (ref 4.22–5.81)
RDW: 13.6 % (ref 11.5–15.5)
WBC: 5.9 10*3/uL (ref 4.0–10.5)

## 2017-10-01 LAB — LIPID PANEL
Cholesterol: 112 mg/dL (ref 0–200)
HDL: 39 mg/dL — AB (ref >40)
LDL CALC: 59 mg/dL (ref 0–99)
Total CHOL/HDL Ratio: 2.9 RATIO
Triglycerides: 68 mg/dL (ref <150)
VLDL: 14 mg/dL (ref 0–40)

## 2017-10-01 LAB — PROTIME-INR
INR: 1.18
Prothrombin Time: 14.9 seconds (ref 11.4–15.2)

## 2017-10-01 LAB — TROPONIN I

## 2017-10-01 LAB — SURGICAL PCR SCREEN
MRSA, PCR: NEGATIVE
Staphylococcus aureus: NEGATIVE

## 2017-10-01 LAB — ECHOCARDIOGRAM COMPLETE: WEIGHTICAEL: 2529.6 [oz_av]

## 2017-10-01 NOTE — Progress Notes (Signed)
  Echocardiogram 2D Echocardiogram has been performed.  Blake Burgess 10/01/2017, 1:09 PM

## 2017-10-01 NOTE — Consult Note (Signed)
Cardiology Consultation:   Patient ID: [name]; [id number]; [birth year]   Admit date: 09/30/2017 Date of Consult: 10/01/2017  Primary Care Provider: Lacie Draft, NP Primary Cardiologist: Hochrein Primary Electrophysiologist: New   Patient Profile:   Blake Burgess is a 84 y.o. male with a hx of coronary artery disease who is being seen today for the evaluation of heart block at the request of Golden Hurter.  History of Present Illness:   [name] he has a history of coronary artery disease status post CABG with cath in 2012 showing patent LIMA to LAD, saphenous vein to the RCA, and saphenous vein to the circumflex, hypertension, CVA, and a Schatzki's ring.  He had a recent echo at [facility] with mild to moderate left ear.  He had been having shortness of breath and mild chest pain for 2 weeks.  He went to the emergency room at [facility] twice within the past 2 days of admission and was sent home with a normal workup.  He had chest pain at home lasting 10 minutes on the day of presentation.  He took an aspirin with improvement in symptoms.  The chest pain was exertional and improved with rest.  EKG in the emergency room showed sinus rhythm with PVCs and intermittent heart block.  He is currently in 2-1 heart block.  He does say that he gets short of breath with just about any exertion as well as fatigued and weak.  He generally feels well at rest.  Currently he has a headache.  Past Medical History:  Diagnosis Date  . Coronary artery disease    a.  s/p CABG;   b. cath 4/12: EF 55%, 3vCAD, patent L-LAD, patent S-RCA, patent S-CFX (done after a false pos. ETT)  . Diverticular disease   . GI bleed   . Hemorrhoids   . HH (hiatus hernia)   . Hypertension   . Osteoarthritis   . Other and unspecified hyperlipidemia   . Schatzki's ring   . Stroke [facility])     Past Surgical History:  Procedure Laterality Date  . ARTERIOVENOUS GRAFT PLACEMENT W/ ENDOSCOPIC VEIN HARVEST     of the  right leg greater spahenous vein. Surgeon: Tharon Aquas Trigt,M.D.  . COLONOSCOPY  02/24/2010   Hemorrhoids, Diverticulosis. Performed at [facility]. Normal terminal ileum. Dr. June Leap, [place] GRAFT  06/21/2007   CABG x 3 Surgeon Ivin Poot, MD  . hip replace  06/09/2004   left hip Surgeon Pietro Cassis. Alvan Dame, MD     Home Medications:  Prior to Admission medications   Medication Sig Start Date End Date Taking? Authorizing Provider  acetaminophen (TYLENOL) 500 MG tablet Take 1,000 mg by mouth 2 (two) times daily.   Yes [provider]  aspirin 81 MG tablet Take 1 tablet (81 mg total) by mouth daily. 06/12/13  Yes Buriev, Arie Sabina, MD  atorvastatin (LIPITOR) 10 MG tablet Take 10 mg by mouth at bedtime.    Yes [provider]  Cyanocobalamin 1000 MCG/ML KIT Inject 1,000 mcg as directed every 30 (thirty) days.   Yes [provider]  enalapril (VASOTEC) 20 MG tablet Take 20 mg by mouth daily.    Yes [provider]  furosemide (LASIX) 20 MG tablet Take 20 mg by mouth as needed for fluid or edema.    Yes [provider]  omeprazole (PRILOSEC) 20 MG capsule Take 20 mg by mouth daily.    Yes [provider]  tamsulosin (FLOMAX) 0.4 MG CAPS capsule Take 0.4 mg by mouth daily. 02/02/16  Yes [provider]  triamcinolone cream (KENALOG) 0.1 % Apply 1 application to both ankles once a day for itching 08/18/17  Yes [provider]    Inpatient Medications: Scheduled Meds: . aspirin  324 mg Oral NOW   Or  . aspirin  300 mg Rectal NOW  . aspirin EC  81 mg Oral Daily  . atorvastatin  10 mg Oral QHS  . enalapril  20 mg Oral Daily  . heparin  5,000 Units Subcutaneous Q8H  . pantoprazole  40 mg Oral Daily   Continuous Infusions:  PRN Meds: acetaminophen, nitroGLYCERIN, ondansetron (ZOFRAN) IV  Allergies:   No Known Allergies  Social History:   Social History   Socioeconomic History  . Marital  status: Married    Spouse name: Not on file  . Number of children: 2  . Years of education: Not on file  . Highest education level: Not on file  Social Needs  . Financial resource strain: Not on file  . Food insecurity - worry: Not on file  . Food insecurity - inability: Not on file  . Transportation needs - medical: Not on file  . Transportation needs - non-medical: Not on file  Occupational History    Employer: RETIRED  Tobacco Use  . Smoking status: Never Smoker  . Smokeless tobacco: Never Used  Substance and Sexual Activity  . Alcohol use: No  . Drug use: No  . Sexual activity: Not on file  Other Topics Concern  . Not on file  Social History Narrative   No Regular exercise. Daily Caffeine: 24 oz pepsi and 1 cup coffee.     Family History:    Family History  Problem Relation Age of Onset  . Heart attack Mother   . Hypertension Mother   . Diabetes Father   . Diabetes Brother   . Diabetes Sister      ROS:  Please see the history of present illness.   All other ROS reviewed and negative.     Physical Exam/Data:   Vitals:   10/01/17 0103 10/01/17 0558 10/01/17 0600 10/01/17 0903  BP: (!) 163/58 (!) 155/69 (!) 181/66 (!) 156/60  Pulse: (!) 58 62 66   Resp:  18 20   Temp: 97.7 F (36.5 C) 98.5 F (36.9 C) 98.5 F (36.9 C)   TempSrc: Oral Oral Oral   SpO2: (!) 12% 93% 97%   Weight: 158 lb 1.6 oz (71.7 kg)       Intake/Output Summary (Last 24 hours) at 10/01/2017 0909 Last data filed at 10/01/2017 0600 Gross per 24 hour  Intake -  Output 800 ml  Net -800 ml   Filed Weights   09/30/17 1424 10/01/17 0103  Weight: 166 lb (75.3 kg) 158 lb 1.6 oz (71.7 kg)   Body mass index is 23.35 kg/m.  General:  Well nourished, well developed, in no acute distress HEENT: normal Lymph: no adenopathy Neck: no JVD Endocrine:  No thryomegaly Vascular: No carotid bruits; FA pulses 2+ bilaterally without bruits  Cardiac:  normal S1, S2; RRR; no murmur  Lungs:  clear to  auscultation bilaterally, no wheezing, rhonchi or rales  Abd: soft, nontender, no hepatomegaly  Ext: no edema Musculoskeletal:  No deformities, BUE and BLE strength normal and equal Skin: warm and dry  Neuro:  CNs 2-12 intact, no focal abnormalities noted Psych:  Normal affect   EKG:  The EKG was personally reviewed and demonstrates:  Sinus rhythm, Second degree AV block Telemetry:  Telemetry was personally reviewed and demonstrates:  Sinus rhythm, 2:1 AV block, intermittent PVCs  Relevant CV Studies: Myoview 02/18/16  The left ventricular ejection fraction is normal (55-65%).  Nuclear stress EF: 54%.  There was no ST segment deviation noted during stress.  The study is normal.  This is a low risk study.   This is a normal pharmacologic nuclear study with no evidence of prior infarct or ischemia. Normal LVEF.   Laboratory Data:  Chemistry Recent Labs  Lab 09/30/17 1440 09/30/17 1809 10/01/17 0619  NA 141 141 139  K 3.8 3.7 3.6  CL 107 107 104  CO2 23 24 24   GLUCOSE 118* 110* 100*  BUN 10 10 9   CREATININE 0.89 0.88 0.86  CALCIUM 8.8* 8.7* 8.7*  GFRNONAA >60 >60 >60  GFRAA >60 >60 >60  ANIONGAP 11 10 11     Recent Labs  Lab 09/30/17 1809  PROT 5.8*  ALBUMIN 3.3*  AST 17  ALT 13*  ALKPHOS 47  BILITOT 0.8   Hematology Recent Labs  Lab 09/30/17 1440 09/30/17 1809 10/01/17 0619  WBC 6.3 5.6 5.9  RBC 3.50* 3.35* 3.49*  HGB 10.8* 10.3* 10.5*  HCT 33.7* 32.0* 33.3*  MCV 96.3 95.5 95.4  MCH 30.9 30.7 30.1  MCHC 32.0 32.2 31.5  RDW 13.8 13.8 13.6  PLT 286 266 272   Cardiac Enzymes Recent Labs  Lab 09/30/17 1809 09/30/17 2344 10/01/17 0619  TROPONINI <0.03 <0.03 <0.03    Recent Labs  Lab 09/30/17 1540  TROPIPOC 0.00    BNP Recent Labs  Lab 09/30/17 1530  BNP 522.0*    DDimer No results for input(s): DDIMER in the last 168 hours.  Radiology/Studies:  Dg Chest Port 1 View  Result Date: 09/30/2017 CLINICAL DATA:  Chest pain short of  breath EXAM: PORTABLE CHEST 1 VIEW COMPARISON:  09/28/2017 FINDINGS: Postop CABG. Negative for heart failure. Lungs are clear without infiltrate or effusion. No interval change from the prior study. IMPRESSION: No active disease. Electronically Signed   By: Franchot Gallo M.D.   On: 09/30/2017 15:18    Assessment and Plan:   1. 2:1 AV block: Patient presented to the hospital complaining of weakness, fatigue, shortness of breath, and chest pain.  Troponin has been negative thus far.  He is currently on heparin for DVT prophylaxis but otherwise is not heparinized.  It is likely that his heart block is causing his symptoms of weakness and fatigue.  I did discuss with him pacemaker implant.  He is agreeable and Forney Kleinpeter plan for Monday.  Awaiting results of echocardiogram to be performed today. 2. Shortness of breath: Chest x-ray without major abnormality.  Likely due to complete heart block 3. Coronary artery disease status post CABG: Cath in 2012 with patent grafts and stress test 2017 without ischemia.  No current chest pain.  Continue aspirin and statin. 4. Hypertension: Blood pressure elevated today.  Continue his ACE inhibitor.  Avoiding AV nodal blockers.  Elevated blood pressure likely due to heart block. 5. Hyperlipidemia: Currently at goal of less than 70. 6. Headaches: Tylenol as needed   For questions or updates, please contact [place] Please consult [email or web address] for contact info under Cardiology/STEMI.   Signed, Julyanna Scholle Meredith Leeds, MD  10/01/2017 9:09 AM

## 2017-10-02 DIAGNOSIS — I441 Atrioventricular block, second degree: Secondary | ICD-10-CM

## 2017-10-02 MED ORDER — SODIUM CHLORIDE 0.9 % IR SOLN
80.0000 mg | Status: AC
  Administered 2017-10-03: 80 mg

## 2017-10-02 MED ORDER — SODIUM CHLORIDE 0.9 % IV SOLN
INTRAVENOUS | Status: DC
  Administered 2017-10-03: 06:00:00 via INTRAVENOUS

## 2017-10-02 MED ORDER — CEFAZOLIN SODIUM-DEXTROSE 2-4 GM/100ML-% IV SOLN
2.0000 g | INTRAVENOUS | Status: AC
  Administered 2017-10-03: 2 g via INTRAVENOUS

## 2017-10-02 NOTE — Progress Notes (Signed)
Progress Note  Patient Name: Blake Burgess Date of Encounter: 10/02/2017  Primary Cardiologist: No primary care provider on file.   Subjective   No complaints overnight.  Inpatient Medications    Scheduled Meds: . aspirin EC  81 mg Oral Daily  . atorvastatin  10 mg Oral QHS  . enalapril  20 mg Oral Daily  . heparin  5,000 Units Subcutaneous Q8H  . pantoprazole  40 mg Oral Daily   Continuous Infusions:  PRN Meds: acetaminophen, nitroGLYCERIN, ondansetron (ZOFRAN) IV   Vital Signs    Vitals:   10/01/17 1934 10/02/17 0027 10/02/17 0452 10/02/17 0824  BP: (!) 163/56 (!) 167/52 (!) 172/46 (!) 155/50  Pulse: (!) 54 (!) 51 (!) 49 (!) 41  Resp: 16 16 16 19   Temp: 97.9 F (36.6 C) 98 F (36.7 C) 98.1 F (36.7 C)   TempSrc: Oral Oral Oral   SpO2: 99% 97% 97% 97%  Weight:        Intake/Output Summary (Last 24 hours) at 10/02/2017 1040 Last data filed at 10/02/2017 0455 Gross per 24 hour  Intake -  Output 400 ml  Net -400 ml   Filed Weights   09/30/17 1424 10/01/17 0103  Weight: 166 lb (75.3 kg) 158 lb 1.6 oz (71.7 kg)    Telemetry    This rhythm with frequent episodes of second-degree AV block, pauses up to 2-2.5 seconds- Personally Reviewed  ECG    No new tracing- Personally Reviewed  Physical Exam  Comfortable, relaxed GEN: No acute distress.   Neck: No JVD Cardiac:  irregular, early peaking aortic systolic ejection murmur, no diastolic murmurs, rubs, or gallops.  Respiratory: Clear to auscultation bilaterally. GI: Soft, nontender, non-distended  MS: No edema; No deformity. Neuro:  Nonfocal  Psych: Normal affect   Labs    Chemistry Recent Labs  Lab 09/30/17 1440 09/30/17 1809 10/01/17 0619  NA 141 141 139  K 3.8 3.7 3.6  CL 107 107 104  CO2 23 24 24   GLUCOSE 118* 110* 100*  BUN 10 10 9   CREATININE 0.89 0.88 0.86  CALCIUM 8.8* 8.7* 8.7*  PROT  --  5.8*  --   ALBUMIN  --  3.3*  --   AST  --  17  --   ALT  --  13*  --   ALKPHOS  --  47   --   BILITOT  --  0.8  --   GFRNONAA >60 >60 >60  GFRAA >60 >60 >60  ANIONGAP 11 10 11      Hematology Recent Labs  Lab 09/30/17 1440 09/30/17 1809 10/01/17 0619  WBC 6.3 5.6 5.9  RBC 3.50* 3.35* 3.49*  HGB 10.8* 10.3* 10.5*  HCT 33.7* 32.0* 33.3*  MCV 96.3 95.5 95.4  MCH 30.9 30.7 30.1  MCHC 32.0 32.2 31.5  RDW 13.8 13.8 13.6  PLT 286 266 272    Cardiac Enzymes Recent Labs  Lab 09/30/17 1809 09/30/17 2344 10/01/17 0619  TROPONINI <0.03 <0.03 <0.03    Recent Labs  Lab 09/30/17 1540  TROPIPOC 0.00     BNP Recent Labs  Lab 09/30/17 1530  BNP 522.0*     DDimer No results for input(s): DDIMER in the last 168 hours.   Radiology    Dg Chest Port 1 View  Result Date: 09/30/2017 CLINICAL DATA:  Chest pain short of breath EXAM: PORTABLE CHEST 1 VIEW COMPARISON:  09/28/2017 FINDINGS: Postop CABG. Negative for heart failure. Lungs are clear without infiltrate or effusion.  No interval change from the prior study. IMPRESSION: No active disease. Electronically Signed   By: Franchot Gallo M.D.   On: 09/30/2017 15:18    Cardiac Studies   Echo October 01, 2017 - Left ventricle: Abnormal septal motion The cavity size was mildly   dilated. Wall thickness was increased in a pattern of mild LVH.   Systolic function was normal. The estimated ejection fraction was   in the range of 50% to 55%. Doppler parameters are consistent   with abnormal left ventricular relaxation (grade 1 diastolic   dysfunction). - Aortic valve: There was mild stenosis. Valve area (VTI): 1.88   cm^2. Valve area (Vmax): 1.95 cm^2. Valve area (Vmean): 1.98   cm^2. - Mitral valve: Calcified annulus. Moderately thickened, moderately   calcified leaflets . There was mild regurgitation. - Left atrium: The atrium was mildly dilated. - Atrial septum: No defect or patent foramen ovale was identified.  Myoview 02/18/16  The left ventricular ejection fraction is normal (55-65%).  Nuclear stress EF:  54%.  There was no ST segment deviation noted during stress.  The study is normal.  This is a low risk study.  This is a normal pharmacologic nuclear study with no evidence of prior infarct or ischemia. Normal LVEF.   Patient Profile     84 y.o. male with symptomatic second-degree AV block, history of CAD with previous bypass surgery, treated hypertension and hyperlipidemia.  Assessment & Plan    He feels well as long as he does not get out of a chair.  His echo shows normal left ventricular systolic function and only mild aortic valve stenosis.  Scheduled for a dual-chamber permanent pacemaker tomorrow. This procedure has been fully reviewed with the patient and written informed consent has been obtained.   For questions or updates, please contact [place] Please consult [email or web address] for contact info under Cardiology/STEMI.      Signed, Sanda Klein, MD  10/02/2017, 10:40 AM

## 2017-10-03 ENCOUNTER — Inpatient Hospital Stay (HOSPITAL_COMMUNITY)
Admission: EM | Disposition: A | Discharge status: Home / Self Care | Payer: Self-pay | Source: Home / Self Care | Attending: Cardiology

## 2017-10-03 ENCOUNTER — Encounter (HOSPITAL_COMMUNITY): Payer: Self-pay | Admitting: Internal Medicine

## 2017-10-03 HISTORY — PX: PACEMAKER IMPLANT: EP1218

## 2017-10-03 SURGERY — PACEMAKER IMPLANT

## 2017-10-03 MED ORDER — ACETAMINOPHEN 325 MG PO TABS
325.0000 mg | ORAL_TABLET | ORAL | Status: DC | PRN
  Administered 2017-10-03 – 2017-10-04 (×2): 650 mg via ORAL
  Filled 2017-10-03 (×2): qty 2

## 2017-10-03 MED ORDER — FENTANYL CITRATE (PF) 100 MCG/2ML IJ SOLN
INTRAMUSCULAR | Status: DC | PRN
  Administered 2017-10-03: 12.5 ug via INTRAVENOUS

## 2017-10-03 MED ORDER — CEFAZOLIN SODIUM-DEXTROSE 1-4 GM/50ML-% IV SOLN
1.0000 g | Freq: Four times a day (QID) | INTRAVENOUS | Status: AC
  Administered 2017-10-03 – 2017-10-04 (×3): 1 g via INTRAVENOUS
  Filled 2017-10-03 (×3): qty 50

## 2017-10-03 MED ORDER — HEPARIN (PORCINE) IN NACL 2-0.9 UNIT/ML-% IJ SOLN
INTRAMUSCULAR | Status: AC
  Filled 2017-10-03: qty 500

## 2017-10-03 MED ORDER — CEFAZOLIN SODIUM-DEXTROSE 2-4 GM/100ML-% IV SOLN
INTRAVENOUS | Status: AC
  Filled 2017-10-03: qty 100

## 2017-10-03 MED ORDER — MIDAZOLAM HCL 5 MG/5ML IJ SOLN
INTRAMUSCULAR | Status: AC
  Filled 2017-10-03: qty 5

## 2017-10-03 MED ORDER — ONDANSETRON HCL 4 MG/2ML IJ SOLN
4.0000 mg | Freq: Four times a day (QID) | INTRAMUSCULAR | Status: DC | PRN
Start: 2017-10-03 — End: 2017-10-04

## 2017-10-03 MED ORDER — SODIUM CHLORIDE 0.9 % IR SOLN
Status: AC
  Filled 2017-10-03: qty 2

## 2017-10-03 MED ORDER — METOPROLOL TARTRATE 5 MG/5ML IV SOLN
INTRAVENOUS | Status: DC | PRN
  Administered 2017-10-03: 5 mg via INTRAVENOUS

## 2017-10-03 MED ORDER — METOPROLOL TARTRATE 5 MG/5ML IV SOLN
INTRAVENOUS | Status: AC
  Filled 2017-10-03: qty 5

## 2017-10-03 MED ORDER — HYDRALAZINE HCL 20 MG/ML IJ SOLN
INTRAMUSCULAR | Status: DC | PRN
  Administered 2017-10-03: 10 mg via INTRAVENOUS

## 2017-10-03 MED ORDER — HEPARIN (PORCINE) IN NACL 2-0.9 UNIT/ML-% IJ SOLN
INTRAMUSCULAR | Status: AC | PRN
  Administered 2017-10-03: 500 mL

## 2017-10-03 MED ORDER — FENTANYL CITRATE (PF) 100 MCG/2ML IJ SOLN
INTRAMUSCULAR | Status: AC
  Filled 2017-10-03: qty 2

## 2017-10-03 MED ORDER — LIDOCAINE HCL (PF) 1 % IJ SOLN
INTRAMUSCULAR | Status: DC | PRN
  Administered 2017-10-03: 60 mL

## 2017-10-03 MED ORDER — LIDOCAINE HCL 1 % IJ SOLN
INTRAMUSCULAR | Status: AC
  Filled 2017-10-03: qty 60

## 2017-10-03 MED ORDER — MIDAZOLAM HCL 5 MG/5ML IJ SOLN
INTRAMUSCULAR | Status: DC | PRN
  Administered 2017-10-03: 1 mg via INTRAVENOUS

## 2017-10-03 MED ORDER — HYDRALAZINE HCL 20 MG/ML IJ SOLN
INTRAMUSCULAR | Status: AC
  Filled 2017-10-03: qty 1

## 2017-10-03 SURGICAL SUPPLY — 12 items
CABLE SURGICAL S-101-97-12 (CABLE) ×2 IMPLANT
CATH RIGHTSITE C315HIS02 (CATHETERS) ×2 IMPLANT
IPG PACE AZUR XT DR MRI W1DR01 (Pacemaker) ×1 IMPLANT
LEAD CAPSURE NOVUS 5076-52CM (Lead) ×2 IMPLANT
LEAD SELECT SECURE 3830 383069 (Lead) ×1 IMPLANT
PACE AZURE XT DR MRI W1DR01 (Pacemaker) ×2 IMPLANT
PAD DEFIB LIFELINK (PAD) ×2 IMPLANT
SELECT SECURE 3830 383069 (Lead) ×2 IMPLANT
SHEATH CLASSIC 7F (SHEATH) ×4 IMPLANT
SLITTER 6232ADJ (MISCELLANEOUS) ×2 IMPLANT
TRAY PACEMAKER INSERTION (PACKS) ×2 IMPLANT
WIRE HI TORQ VERSACORE-J 145CM (WIRE) ×2 IMPLANT

## 2017-10-03 NOTE — Progress Notes (Signed)
Pt's bp elevated greater than 034 systolic.  Paged cardiology master at this time.

## 2017-10-03 NOTE — Progress Notes (Signed)
Notified [facility] of pt being in bigeminy without any s/s of acute distress.  States, "Its ok.  We will be ordering meds to help with this."

## 2017-10-03 NOTE — Progress Notes (Signed)
Electrophysiology Rounding Note  Patient Name: Blake Burgess Date of Encounter: 10/03/2017  Primary Cardiologist: Percival Spanish Electrophysiologist: Lovena Le (new this admission)   Subjective   The patient is doing well today.  At this time, the patient denies chest pain, shortness of breath, or any new concerns.  Inpatient Medications    Scheduled Meds: . aspirin EC  81 mg Oral Daily  . atorvastatin  10 mg Oral QHS  . enalapril  20 mg Oral Daily  . gentamicin irrigation  80 mg Irrigation On Call  . heparin  5,000 Units Subcutaneous Q8H  . pantoprazole  40 mg Oral Daily   Continuous Infusions: . sodium chloride 50 mL/hr at 10/03/17 0554  .  ceFAZolin (ANCEF) IV     PRN Meds: acetaminophen, nitroGLYCERIN, ondansetron (ZOFRAN) IV   Vital Signs    Vitals:   10/02/17 2140 10/02/17 2339 10/03/17 0535 10/03/17 0600  BP: (!) 150/55 (!) 145/48 (!) 182/67   Pulse: (!) 54 (!) 53 (!) 32   Resp: 20 17 13    Temp: 97.6 F (36.4 C) 97.9 F (36.6 C) 98 F (36.7 C)   TempSrc: Oral Oral Oral   SpO2: 97% 96% 99%   Weight:    159 lb 13.3 oz (72.5 kg)  Height:    5\' 9"  (1.753 m)    Intake/Output Summary (Last 24 hours) at 10/03/2017 0726 Last data filed at 10/03/2017 0554 Gross per 24 hour  Intake 0 ml  Output 300 ml  Net -300 ml   Filed Weights   09/30/17 1424 10/01/17 0103 10/03/17 0600  Weight: 166 lb (75.3 kg) 158 lb 1.6 oz (71.7 kg) 159 lb 13.3 oz (72.5 kg)    Physical Exam    GEN- The patient is elderly appearing, alert and oriented x 3 today.   Head- normocephalic, atraumatic Eyes-  Sclera clear, conjunctiva pink Ears- hearing intact Oropharynx- clear Neck- supple Lungs- Clear to ausculation bilaterally, normal work of breathing Heart- Bradycardic irregular rate and rhythm, no murmurs, rubs or gallops GI- soft, NT, ND, + BS Extremities- no clubbing, cyanosis, or edema Skin- no rash or lesion Psych- euthymic mood, full affect Neuro- strength and sensation are  intact  Labs    CBC Recent Labs    09/30/17 1809 10/01/17 0619  WBC 5.6 5.9  HGB 10.3* 10.5*  HCT 32.0* 33.3*  MCV 95.5 95.4  PLT 266 706   Basic Metabolic Panel Recent Labs    09/30/17 1530 09/30/17 1809 10/01/17 0619  NA  --  141 139  K  --  3.7 3.6  CL  --  107 104  CO2  --  24 24  GLUCOSE  --  110* 100*  BUN  --  10 9  CREATININE  --  0.88 0.86  CALCIUM  --  8.7* 8.7*  MG 2.1 2.0  --    Liver Function Tests Recent Labs    09/30/17 1809  AST 17  ALT 13*  ALKPHOS 47  BILITOT 0.8  PROT 5.8*  ALBUMIN 3.3*   No results for input(s): LIPASE, AMYLASE in the last 72 hours. Cardiac Enzymes Recent Labs    09/30/17 1809 09/30/17 2344 10/01/17 0619  TROPONINI <0.03 <0.03 <0.03   Fasting Lipid Panel Recent Labs    10/01/17 0619  CHOL 112  HDL 39*  LDLCALC 59  TRIG 68  CHOLHDL 2.9   Thyroid Function Tests Recent Labs    09/30/17 1809  TSH 1.165    Telemetry  Sinus rhythm, high grade heart block, PVC's (personally reviewed)  Radiology    No results found.   Patient Profile     MANUELITO POAGE is a 84 y.o. male admitted with symptomatic bradycardia  Assessment & Plan    1.  High grade heart block Persists despite BB wash out Plan pacemaker implant today (EF normal this admission). Risks, benefits reviewed with patient who wishes to proceed.   2.  HTN BP elevated Will resume Toprol after pacemaker implanted  Dr Lovena Le to see later today  Signed, Chanetta Marshall, NP  10/03/2017, 7:26 AM   EP Attending  Patient seen and examined. Agree with the findings as noted above. He has symptomatic bradycardia which improved but did not resolve with cessation of his beta blocker. I have discussed the indications/risks/benefits/goals/expectations of PPM insertion and he wishes to proceed.  Mikle Bosworth.D.

## 2017-10-03 NOTE — Progress Notes (Signed)
Notified Blake Marshall, NP of pt's current bp.  States, "ok, will be sending for him soon and will be soon sedated.  If bp elevated post procedure, will address then."

## 2017-10-04 ENCOUNTER — Inpatient Hospital Stay (HOSPITAL_COMMUNITY): Payer: Medicare Other

## 2017-10-04 MED ORDER — METOPROLOL SUCCINATE ER 50 MG PO TB24: 50.0000 mg | ORAL_TABLET | Freq: Every day | ORAL | 1 refills | Status: DC

## 2017-10-04 MED ORDER — METOPROLOL SUCCINATE ER 50 MG PO TB24
50.0000 mg | ORAL_TABLET | Freq: Every day | ORAL | Status: DC
  Administered 2017-10-04: 50 mg via ORAL
  Filled 2017-10-04: qty 1

## 2017-10-04 MED ORDER — HYDRALAZINE HCL 20 MG/ML IJ SOLN
10.0000 mg | INTRAMUSCULAR | Status: AC
  Administered 2017-10-04: 10 mg via INTRAVENOUS
  Filled 2017-10-04: qty 1

## 2017-10-04 MED FILL — Lidocaine HCl Local Inj 1%: INTRAMUSCULAR | Qty: 20 | Status: AC

## 2017-10-04 NOTE — Progress Notes (Signed)
Order received to discharge patient.  Telemetry monitor removed and CCMD notified.  PIV access removed.  Discharge instructions, follow up, medications and instructions for their use discussed with patient. 

## 2017-10-04 NOTE — Progress Notes (Signed)
Notified Dr. Radford Pax on call pt with multiform PVCs, [place], physically assessed and shows no signs of distress, HR remains in 80s.

## 2017-10-04 NOTE — Care Management Important Message (Signed)
Important Message  Patient Details  Name: Blake Burgess MRN: [id number] Date of Birth: [birth year]   Medicare Important Message Given:  Yes    Orbie Pyo 10/04/2017, 11:18 AM

## 2017-10-04 NOTE — Care Management Note (Signed)
Case Management Note Marvetta Gibbons RN, BSN Unit 4E-Case Manager [phone]  Patient Details  Name: Blake Burgess MRN: [id number] Date of Birth: May 30, 1932  Subjective/Objective:   Pt admitted with complete heart block - s/p PPM                 Action/Plan: PTA pt lived at home with spouse- plan to return home- no CM needs noted for transition home.   Expected Discharge Date:  10/04/17               Expected Discharge Plan:  Home/Self Care  In-House Referral:  NA  Discharge planning Services  CM Consult  Post Acute Care Choice:  NA Choice offered to:  NA  DME Arranged:    DME Agency:     HH Arranged:    HH Agency:     Status of Service:  Completed, signed off  If discussed at [place] of Stay Meetings, dates discussed:    Discharge Disposition: home/self care   Additional Comments:  Dawayne Patricia, RN 10/04/2017, 11:04 AM

## 2017-10-04 NOTE — Progress Notes (Signed)
Placed call to patient's son.  Left message indicating that patient had been discharged and was ready for a ride home.

## 2017-10-04 NOTE — Discharge Summary (Signed)
ELECTROPHYSIOLOGY PROCEDURE DISCHARGE SUMMARY    Patient ID: Blake Burgess,  MRN: [id number], DOB/AGE: Feb 05, 1932 84 y.o.  Admit date: 09/30/2017 Discharge date: 10/04/2017  Primary Care Physician: Lacie Draft, NP Primary Cardiologist: Percival Spanish Electrophysiologist: Lovena Le  Primary Discharge Diagnosis:  Symptomatic heart block status post pacemaker implantation this admission  Secondary Discharge Diagnosis:  1.  HTN 2.  CAD s/p CABG 3.  Prior CVA  No Known Allergies   Procedures This Admission:  1.  Implantation of a MDT dual chamber PPM on 10/03/17 by Dr Lovena Le.  The patient received a MDT model number Azure PPM with model number 5076 right atrial lead and 3830 right ventricular lead. There were no immediate post procedure complications. 2.  CXR on 10/04/17 demonstrated no pneumothorax status post device implantation.   Brief HPI/Hospital Course:  Blake Burgess is a 84 y.o. male admitted with symptomatic bradycardia that persisted despite BB washout.  Risks, benefits, and alternatives to PPM implantation were reviewed with the patient who wished to proceed.  The patient underwent implantation of a MDT dual chamber PPM with details as outlined above.  He  was monitored on telemetry overnight which demonstrated sinus rhythm with V pacing, PVC's.  Left chest was without hematoma or ecchymosis.  The device was interrogated and found to be functioning normally.  CXR was obtained and demonstrated no pneumothorax status post device implantation.  Wound care, arm mobility, and restrictions were reviewed with the patient.  The patient was examined and considered stable for discharge to home.    Physical Exam: Vitals:   10/04/17 0200 10/04/17 0252 10/04/17 0746 10/04/17 0748  BP:  (!) 185/76 (!) 157/77 (!) 187/63  Pulse: (!) 39  94 100  Resp: (!) _0 Temp:  98.1 F (36.7 C) 97.6 F (36.4 C)   TempSrc:  Oral Oral   SpO2: 98% 97%    Weight:      Height:        GEN- The  patient is elderly appearing, alert and oriented x 3 today.   HEENT: normocephalic, atraumatic; sclera clear, conjunctiva pink; hearing intact; oropharynx clear; neck supple  Lungs- Clear to ausculation bilaterally, normal work of breathing.  No wheezes, rales, rhonchi Heart- Regular rate and rhythm (paced) GI- soft, non-tender, non-distended, bowel sounds present  Extremities- no clubbing, cyanosis, or edema  MS- no significant deformity or atrophy Skin- warm and dry, no rash or lesion, left chest without hematoma/ecchymosis Psych- euthymic mood, full affect Neuro- strength and sensation are intact   Labs:   Lab Results  Component Value Date   WBC 5.9 10/01/2017   HGB 10.5 (L) 10/01/2017   HCT 33.3 (L) 10/01/2017   MCV 95.4 10/01/2017   PLT 272 10/01/2017    Recent Labs  Lab 09/30/17 1809 10/01/17 0619  NA 141 139  K 3.7 3.6  CL 107 104  CO2 24 24  BUN 10 9  CREATININE 0.88 0.86  CALCIUM 8.7* 8.7*  PROT 5.8*  --   BILITOT 0.8  --   ALKPHOS 47  --   ALT 13*  --   AST 17  --   GLUCOSE 110* 100*    Discharge Medications:  Allergies as of 10/04/2017   No Known Allergies     Medication List    TAKE these medications   acetaminophen 500 MG tablet Commonly known as:  TYLENOL Take 1,000 mg by mouth 2 (two) times daily.   aspirin 81 MG tablet  Take 1 tablet (81 mg total) by mouth daily.   atorvastatin 10 MG tablet Commonly known as:  LIPITOR Take 10 mg by mouth at bedtime.   Cyanocobalamin 1000 MCG/ML Kit Inject 1,000 mcg as directed every 30 (thirty) days.   enalapril 20 MG tablet Commonly known as:  VASOTEC Take 20 mg by mouth daily.   furosemide 20 MG tablet Commonly known as:  LASIX Take 20 mg by mouth as needed for fluid or edema.   metoprolol succinate 50 MG 24 hr tablet Commonly known as:  TOPROL-XL Take 1 tablet (50 mg total) by mouth daily. Take with or immediately following a meal.   omeprazole 20 MG capsule Commonly known as:   PRILOSEC Take 20 mg by mouth daily.   tamsulosin 0.4 MG Caps capsule Commonly known as:  FLOMAX Take 0.4 mg by mouth daily.   triamcinolone cream 0.1 % Commonly known as:  KENALOG Apply 1 application to both ankles once a day for itching       Disposition:  Discharge Instructions    Diet - low sodium heart healthy   Complete by:  As directed    Increase activity slowly   Complete by:  As directed      Follow-up Information    [place] [facility] Follow up on 10/20/2017.   Specialty:  Cardiology Why:  at [facility] information: [address], [place] [address] [phone]          Duration of Discharge Encounter: Greater than 30 minutes including physician time.  Signed, Chanetta Marshall, NP 10/04/2017 8:14 AM  EP Attending  Patient seen and examined. Agree with above. The patient vitals are better today and his PPM interogation under my direction demonstrates normal DDD PM function with his bundle pacing. His BP is up, likely due to 1:1 conduction and washout of his beta blocker. We will restart his beta blocker and DC home. Usual PPM followup.   Mikle Bosworth.D.

## 2017-10-04 NOTE — Discharge Instructions (Signed)
° ° °  Supplemental Discharge Instructions for  Pacemaker/Defibrillator Patients  Activity No heavy lifting or vigorous activity with your left/right arm for 6 to 8 weeks.  Do not raise your left/right arm above your head for one week.  Gradually raise your affected arm as drawn below.           __          10/08/17                10/09/17                      10/10/17                  10/11/17  NO DRIVING for 1 week    ; you may begin driving on  9/92/42   .  WOUND CARE - Keep the wound area clean and dry.  Do not get this area wet for one week. No showers for one week; you may shower on    10/11/17 . - The tape/steri-strips on your wound will fall off; do not pull them off.  No bandage is needed on the site.  DO  NOT apply any creams, oils, or ointments to the wound area. - If you notice any drainage or discharge from the wound, any swelling or bruising at the site, or you develop a fever > 101? F after you are discharged home, call the office at once.  Special Instructions - You are still able to use cellular telephones; use the ear opposite the side where you have your pacemaker/defibrillator.  Avoid carrying your cellular phone near your device. - When traveling through airports, show security personnel your identification card to avoid being screened in the metal detectors.  Ask the security personnel to use the hand wand. - Avoid arc welding equipment, MRI testing (magnetic resonance imaging), TENS units (transcutaneous nerve stimulators).  Call the office for questions about other devices. - Avoid electrical appliances that are in poor condition or are not properly grounded. - Microwave ovens are safe to be near or to operate.

## 2017-10-11 ENCOUNTER — Ambulatory Visit (INDEPENDENT_AMBULATORY_CARE_PROVIDER_SITE_OTHER): Payer: Self-pay | Admitting: *Deleted

## 2017-10-11 ENCOUNTER — Ambulatory Visit (INDEPENDENT_AMBULATORY_CARE_PROVIDER_SITE_OTHER): Payer: Medicare Other | Admitting: Cardiology

## 2017-10-11 ENCOUNTER — Encounter: Payer: Self-pay | Admitting: Cardiology

## 2017-10-11 VITALS — BP 134/52 | HR 82 | Ht 69.0 in | Wt 164.0 lb

## 2017-10-11 DIAGNOSIS — I10 Essential (primary) hypertension: Secondary | ICD-10-CM | POA: Diagnosis not present

## 2017-10-11 DIAGNOSIS — I5031 Acute diastolic (congestive) heart failure: Secondary | ICD-10-CM | POA: Insufficient documentation

## 2017-10-11 DIAGNOSIS — I442 Atrioventricular block, complete: Secondary | ICD-10-CM

## 2017-10-11 DIAGNOSIS — Z950 Presence of cardiac pacemaker: Secondary | ICD-10-CM | POA: Insufficient documentation

## 2017-10-11 DIAGNOSIS — E785 Hyperlipidemia, unspecified: Secondary | ICD-10-CM | POA: Diagnosis not present

## 2017-10-11 DIAGNOSIS — Z951 Presence of aortocoronary bypass graft: Secondary | ICD-10-CM | POA: Diagnosis not present

## 2017-10-11 NOTE — Assessment & Plan Note (Signed)
Controlled.  

## 2017-10-11 NOTE — Progress Notes (Signed)
10/11/2017 KAEGAN HETTICH   1932-02-15  [id number]  Primary Physician Lacie Draft, NP Primary Cardiologist: Dr Percival Spanish Dr Lovena Le  HPI:  Pleasant 84 y/o male seen in the office today as a post pacemaker/ hospitalization follow up. Mr Lemmons has a history of CAD, s/p CABG x 3 in 2008. He had a cath in 2012 that revealed 3/3 patent grafts with a patent LIMA to LAD, SVG to RCA and SVG to LCx (cath done for false positive ETT). Other medical problems include hiatal hernia, HTN, HLD, prior CVA and a Schatzki's ring. He had an echo done at [facility] recently showing mild to moderate AS. This was done after his PCP noted a new murmur.  He had a normal nuclear stress test in June 2017. The patien has been having SOB and mild chest pain off and on for 2 weeks. He presented to the ED 09/25/17 and was noted to be in CHB. He underwent placement of a MDT pacemaker 10/03/17.   Since discharge he says he feels a little better. His main complaint is still DOE. During his hospitalization his BNP was noted to be >500 and his wgt went from 158 lbs to 164 lbs.  I suspect he may have some diastolic CHF possibly from his CHB. Also today his EKG shows frequent (?) PVCs with an atrial paced beat during repolarization. A Limited pacer check in the office today did note "atrial lead failure" on 10/05/17. After discussion with the DOD he will be sent to the pacemaker clinic this afternoon for further evaluation.    Current Outpatient Medications  Medication Sig Dispense Refill  . acetaminophen (TYLENOL) 500 MG tablet Take 1,000 mg by mouth 2 (two) times daily.    Marland Kitchen aspirin 81 MG tablet Take 1 tablet (81 mg total) by mouth daily. 30 tablet   . atorvastatin (LIPITOR) 10 MG tablet Take 10 mg by mouth at bedtime.     . Cyanocobalamin 1000 MCG/ML KIT Inject 1,000 mcg as directed every 30 (thirty) days.    . enalapril (VASOTEC) 20 MG tablet Take 20 mg by mouth daily.     . furosemide (LASIX) 20 MG tablet Take 20 mg by  mouth as needed for fluid or edema.     . metoprolol succinate (TOPROL-XL) 50 MG 24 hr tablet Take 1 tablet (50 mg total) by mouth daily. Take with or immediately following a meal. 90 tablet 1  . omeprazole (PRILOSEC) 20 MG capsule Take 20 mg by mouth daily.     Marland Kitchen triamcinolone cream (KENALOG) 0.1 % Apply 1 application to both ankles once a day for itching  0   No current facility-administered medications for this visit.     No Known Allergies  Past Medical History:  Diagnosis Date  . Coronary artery disease    a.  s/p CABG;   b. cath 4/12: EF 55%, 3vCAD, patent L-LAD, patent S-RCA, patent S-CFX (done after a false pos. ETT)  . Diverticular disease   . GI bleed   . Hemorrhoids   . HH (hiatus hernia)   . Hypertension   . Osteoarthritis   . Other and unspecified hyperlipidemia   . Schatzki's ring   . Stroke [facility])     Social History   Socioeconomic History  . Marital status: Married    Spouse name: Not on file  . Number of children: 2  . Years of education: Not on file  . Highest education level: Not on file  Social  Needs  . Financial resource strain: Not on file  . Food insecurity - worry: Not on file  . Food insecurity - inability: Not on file  . Transportation needs - medical: Not on file  . Transportation needs - non-medical: Not on file  Occupational History    Employer: RETIRED  Tobacco Use  . Smoking status: Never Smoker  . Smokeless tobacco: Never Used  Substance and Sexual Activity  . Alcohol use: No  . Drug use: No  . Sexual activity: Not on file  Other Topics Concern  . Not on file  Social History Narrative   No Regular exercise. Daily Caffeine: 24 oz pepsi and 1 cup coffee.      Family History  Problem Relation Age of Onset  . Heart attack Mother   . Hypertension Mother   . Diabetes Father   . Diabetes Brother   . Diabetes Sister      Review of Systems: General: negative for chills, fever, night sweats or weight changes.  Cardiovascular:  negative for chest pain, dyspnea on exertion, edema, orthopnea, palpitations, paroxysmal nocturnal dyspnea or shortness of breath Dermatological: negative for rash Respiratory: negative for cough or wheezing Urologic: negative for hematuria Abdominal: negative for nausea, vomiting, diarrhea, bright red blood per rectum, melena, or hematemesis Neurologic: negative for visual changes, syncope, or dizziness All other systems reviewed and are otherwise negative except as noted above.    Blood pressure (!) 134/52, pulse 82, height 5' 9"  (1.753 m), weight 164 lb (74.4 kg).  General appearance: alert, cooperative, appears stated age and no distress Neck: no carotid bruit and no JVD Lungs: few crackles Lt base. Pacer site without hematoma Heart: regular rate and rhythm and frequent extra systole, systolic murmur AOV Extremities: extremities normal, atraumatic, no cyanosis or edema Skin: Skin color, texture, turgor normal. No rashes or lesions Neurologic: Grossly normal  EKG AV paced with frequent PVCs- with atrial pacer spikes during repolarization.   ASSESSMENT AND PLAN:   CHB (complete heart block) ([place]) Admitted 09/30/17 with symptomatic CHB  Pacemaker MDT pacemaker implanted 10/03/17  Hx of CABG-2008 CABG x 3 in 2008. Cath in 2012 showed patent grafts-LIMA-LAD, SVG-RCA, patent SVG-CFX  (false positive stress led to cath). Low risk Nuc 2017  Essential hypertension Controlled  Dyslipidemia, goal LDL below 70 LDL 59 Feb 8250  Acute diastolic (congestive) heart failure (HCC) Wgt up 6 lbs, BNP > 500- start daily Lasix   PLAN  I suggested the pt take his PRN lasix 20 mg daily for at least a week to see if his DOE doesn't improve. I sent him to the pacer clinic a [facility] for further evaluation.   Kerin Ransom PA-C 10/11/2017 3:59 PM

## 2017-10-11 NOTE — Patient Instructions (Addendum)
Lurena Joiner PA has recommended that you go to [address] for a device clinic appointment TODAY   Your physician recommends that you schedule a follow-up appointment in [place] with Dr. Percival Spanish

## 2017-10-11 NOTE — Assessment & Plan Note (Signed)
CABG x 3 in 2008. Cath in 2012 showed patent grafts-LIMA-LAD, SVG-RCA, patent SVG-CFX  (false positive stress led to cath). Low risk Nuc 2017

## 2017-10-11 NOTE — Progress Notes (Signed)
Patient seen in office today at the request of Katherene Ponto, [place]. Per referral EKG noted to have pacer spikes within the PVCs. Upon assessment in the office safety pacing and bigeminal PVCs noted. No safety pacing noted at lower rate of 60. Per GT patient programed to lower rate of 60bpm. Rate response turned on with an activity threshold of Medium High. ROV in DC for wound check on 2/28.

## 2017-10-11 NOTE — Assessment & Plan Note (Signed)
MDT pacemaker implanted 10/03/17

## 2017-10-11 NOTE — Assessment & Plan Note (Signed)
Admitted 09/30/17 with symptomatic CHB

## 2017-10-11 NOTE — Assessment & Plan Note (Signed)
LDL 59 Feb 2019

## 2017-10-11 NOTE — Assessment & Plan Note (Signed)
Wgt up 6 lbs, BNP > 500- start daily Lasix

## 2017-10-20 ENCOUNTER — Ambulatory Visit (INDEPENDENT_AMBULATORY_CARE_PROVIDER_SITE_OTHER): Payer: Medicare Other | Admitting: *Deleted

## 2017-10-20 DIAGNOSIS — I442 Atrioventricular block, complete: Secondary | ICD-10-CM

## 2017-10-20 DIAGNOSIS — R55 Syncope and collapse: Secondary | ICD-10-CM

## 2017-10-20 DIAGNOSIS — R001 Bradycardia, unspecified: Secondary | ICD-10-CM | POA: Diagnosis not present

## 2017-10-20 LAB — CUP PACEART INCLINIC DEVICE CHECK
Battery Remaining Longevity: 100 mo
Battery Voltage: 3.18 V
Brady Statistic AP VS Percent: 0.01 %
Brady Statistic AS VS Percent: 6 %
Date Time Interrogation Session: 20190228165148
Implantable Lead Implant Date: 20190211
Implantable Lead Implant Date: 20190211
Implantable Lead Model: 5076
Implantable Pulse Generator Implant Date: 20190211
Lead Channel Impedance Value: 304 Ohm
Lead Channel Impedance Value: 399 Ohm
Lead Channel Pacing Threshold Amplitude: 0.5 V
Lead Channel Pacing Threshold Amplitude: 0.5 V
Lead Channel Pacing Threshold Pulse Width: 0.4 ms
Lead Channel Sensing Intrinsic Amplitude: 3.875 mV
Lead Channel Sensing Intrinsic Amplitude: 5.875 mV
Lead Channel Setting Sensing Sensitivity: 0.6 mV
MDC IDC LEAD LOCATION: 753859
MDC IDC LEAD LOCATION: 753860
MDC IDC MSMT LEADCHNL RA PACING THRESHOLD PULSEWIDTH: 0.4 ms
MDC IDC MSMT LEADCHNL RV IMPEDANCE VALUE: 247 Ohm
MDC IDC MSMT LEADCHNL RV IMPEDANCE VALUE: 513 Ohm
MDC IDC MSMT LEADCHNL RV SENSING INTR AMPL: 1.875 mV
MDC IDC MSMT LEADCHNL RV SENSING INTR AMPL: 1.875 mV
MDC IDC SET LEADCHNL RA PACING AMPLITUDE: 3.5 V
MDC IDC SET LEADCHNL RV PACING AMPLITUDE: 2.5 V
MDC IDC SET LEADCHNL RV PACING PULSEWIDTH: 1 ms
MDC IDC STAT BRADY AP VP PERCENT: 31.72 %
MDC IDC STAT BRADY AS VP PERCENT: 62.27 %
MDC IDC STAT BRADY RA PERCENT PACED: 33.14 %
MDC IDC STAT BRADY RV PERCENT PACED: 93.99 %

## 2017-10-20 NOTE — Progress Notes (Signed)
Wound check appointment s/p PPM placement with HIS bundle RV lead by Dr. Lovena Le 10/03/17. Steri-strips removed. Wound without redness or edema. Incision edges approximated, wound well healed. Normal device function. Thresholds, sensing, and impedances consistent with implant measurements. Difficult to assess HIS threshold @ 108ms d/t PVCs/dependency. No apparent LOC @ 28ms, but patient became dizzy around 1V @ 7ms. Assistance acquired to check radial pulse and obtain 12 lead rhythm strip during threshold- 0.5V @ 0.94ms. Device programmed at 3.5V in RA for extra safety margin until 3 month visit, RV output decreased to 2.5V @ 80ms per GT. Histogram distribution appropriate for patient and level of activity. No AT/AF episodes. 1 episode of 1:1 SVT lasting 1 second at 164bpm. Patient educated about wound care, arm mobility, lifting restrictions and Carelink monitoring. ROV with GT 01/12/18.

## 2017-10-21 ENCOUNTER — Other Ambulatory Visit: Payer: Self-pay | Admitting: Internal Medicine

## 2017-11-02 ENCOUNTER — Other Ambulatory Visit: Payer: Self-pay | Admitting: Internal Medicine

## 2017-11-04 ENCOUNTER — Telehealth: Payer: Self-pay | Admitting: Cardiology

## 2017-11-04 NOTE — Telephone Encounter (Signed)
Spoke with Blake Burgess at Dr. Cline Cools office [facility]) who sees this patient for pain management. Patient reported that he was on a blood thinner.Blake Burgess advised that no blood thinners are listed on medication profile but he was on antiplatelet aspirin.  Patient needs MRI. Please advise if he can have this done and fax response to their office.  Fax # [phone]

## 2017-11-04 NOTE — Telephone Encounter (Signed)
LVM with [facility] triage line with pt's DOB and call back number to device clinic regarding pts pacemaker and MRI capability.

## 2017-11-04 NOTE — Telephone Encounter (Signed)
Spoke with Vivien Rota at [employer] informed her that pts device is MRI capable but she would need to have someone from [employer] come out and program the device to an MRI safe mode and gave her number to [employer].

## 2017-11-04 NOTE — Telephone Encounter (Signed)
New Message    Is this patient on a blood thinner ?  Katie at [facility] is calling for his pain management

## 2017-11-16 ENCOUNTER — Other Ambulatory Visit: Payer: Self-pay | Admitting: Internal Medicine

## 2017-12-13 ENCOUNTER — Telehealth: Payer: Self-pay

## 2017-12-13 NOTE — Telephone Encounter (Signed)
   [facility] Pre-operative Risk Assessment    Request for surgical clearance:  1. What type of surgery is being performed? CT Myelogram  2. When is this surgery scheduled? TBD  3. What type of clearance is required (medical clearance vs. Pharmacy clearance to hold med vs. Both)? Pharmacy  4. Are there any medications that need to be held prior to surgery and how long? Aspirin for 5 days prior to procedure  5. Practice name and name of physician performing surgery? [facility]  Dr.Daniel Jimmye Norman  6. What is your office phone number [phone]   7.   What is your office fax number [phone]  8.   Anesthesia type (None, local, MAC, general) ?       Karie Soda 12/13/2017, 12:24 PM  _________________________________________________________________   (provider comments below)

## 2017-12-13 NOTE — Telephone Encounter (Signed)
Dr. Percival Spanish, can this patient's aspirin be held for 5 days prior to CT myelogram?   Please route response back to CV DIV PREOP

## 2017-12-15 NOTE — Telephone Encounter (Signed)
Gae Bon,  I tried to send to CV Div Preop but that did not work.  OK for this patient to hold ASA as needed for the procedure.

## 2017-12-15 NOTE — Telephone Encounter (Signed)
   Primary Cardiologist:James Hochrein, MD  Chart reviewed as part of pre-operative protocol. Pre-op clearance already addressed by colleagues in earlier phone notes. To summarize recommendations, Dr. Percival Spanish states "OK for this patient to hold ASA as needed for the procedure. "  Will route this bundled recommendation to requesting provider via Epic fax function. Please call with questions.  Charlie Pitter, PA-C 12/15/2017, 2:40 PM

## 2018-01-04 NOTE — Progress Notes (Signed)
HPI The patient presents for followup of his known coronary disease  In 2012 he did have an abnormal stress test followed by catheterization which demonstrated patent bypass grafts.  His last stress in 2017 was unremarkable.  In Feb he had chest pain and SOB and came to the ED and was noted to be in CHB.  He had a pacemaker placed.    Since I last saw him he has done well.  He feels a little weak today but he thinks that his BP is too low for him but that it usually comes up to the 150s.  The patient denies any new symptoms such as chest discomfort, neck or arm discomfort. There has been no new shortness of breath, PND or orthopnea. There have been no reported palpitations, presyncope or syncope.   No Known Allergies  Current Outpatient Medications  Medication Sig Dispense Refill  . acetaminophen (TYLENOL) 500 MG tablet Take 1,000 mg by mouth 2 (two) times daily.    Marland Kitchen amLODipine (NORVASC) 5 MG tablet Take 5 mg by mouth daily.    Marland Kitchen aspirin 81 MG tablet Take 1 tablet (81 mg total) by mouth daily. 30 tablet   . atorvastatin (LIPITOR) 10 MG tablet Take 10 mg by mouth at bedtime.     . Cyanocobalamin 1000 MCG/ML KIT Inject 1,000 mcg as directed every 30 (thirty) days.    . enalapril (VASOTEC) 20 MG tablet Take 20 mg by mouth daily.     . metoprolol succinate (TOPROL-XL) 50 MG 24 hr tablet Take 1 tablet (50 mg total) by mouth daily. Take with or immediately following a meal. 90 tablet 1  . omeprazole (PRILOSEC) 20 MG capsule Take 20 mg by mouth daily.     . tamsulosin (FLOMAX) 0.4 MG CAPS capsule Take 0.4 mg by mouth daily.     No current facility-administered medications for this visit.     Past Medical History:  Diagnosis Date  . Coronary artery disease    a.  s/p CABG;   b. cath 4/12: EF 55%, 3vCAD, patent L-LAD, patent S-RCA, patent S-CFX (done after a false pos. ETT)  . Diverticular disease   . GI bleed   . Hemorrhoids   . HH (hiatus hernia)   . Hypertension   . Osteoarthritis     . Other and unspecified hyperlipidemia   . Schatzki's ring   . Stroke [facility])     Past Surgical History:  Procedure Laterality Date  . ARTERIOVENOUS GRAFT PLACEMENT W/ ENDOSCOPIC VEIN HARVEST     of the right leg greater spahenous vein. Surgeon: Tharon Aquas Trigt,M.D.  . COLONOSCOPY  02/24/2010   Hemorrhoids, Diverticulosis. Performed at [place]. Normal terminal ileum. Dr. June Leap, [place] GRAFT  06/21/2007   CABG x 3 Surgeon Ivin Poot, MD  . hip replace  06/09/2004   left hip Surgeon Pietro Cassis. Alvan Dame, [place] IMPLANT N/A 10/03/2017   Procedure: PACEMAKER IMPLANT;  Surgeon: Evans Lance, MD;  Location: [facility] CV LAB;  Service: Cardiovascular;  Laterality: N/A;    ROS:  As stated in the HPI and negative for all other systems.  PHYSICAL EXAM BP (!) 136/52 (BP Location: Left Arm, Patient Position: Sitting, Cuff Size: Normal)   Pulse 68   Ht _0  (1.753 m)   Wt 164 lb (74.4 kg)   BMI 24.22 kg/m   GENERAL:  Well appearing for his age NECK:  No jugular venous distention, waveform  within normal limits, carotid upstroke brisk and symmetric, no bruits, no thyromegaly LUNGS:  Clear to auscultation bilaterally CHEST:  Unremarkable HEART:  PMI not displaced or sustained,S1 and S2 within normal limits, no S3, no S4, no clicks, no rubs, 2 out of 6 apical systolic murmur early peaking and nonradiating, no diastolic murmurs  ABD:  Flat, positive bowel sounds normal in frequency in pitch, no bruits, no rebound, no guarding, no midline pulsatile mass, no hepatomegaly, no splenomegaly EXT:  2 plus pulses throughout, no edema, no cyanosis no clubbing  EKG:  NA  Lab Results  Component Value Date   CHOL 112 10/01/2017   TRIG 68 10/01/2017   HDL 39 (L) 10/01/2017   LDLCALC 59 10/01/2017     ASSESSMENT AND PLAN  CAD:  The patient has no new sypmtoms.  No further cardiovascular testing is indicated.  We will continue with aggressive risk reduction  and meds as listed.  HTN:  The blood pressure is at target.  No change in therapy.   HYPERLIPIDEMIA:    The LDL was at target as above.  No change in therapy.   PPM PLACEMENT:   He is up to date with follow up.    I do note that he has PVCs noted on device clinic follow-up.  However, he is not really feeling these.  Therefore, no change in therapy is indicated.  DECREASED PEDAL PULSES: Again this visit he has no symptoms related to this.  No change in therapy.

## 2018-01-05 ENCOUNTER — Encounter: Payer: Self-pay | Admitting: Cardiology

## 2018-01-05 ENCOUNTER — Ambulatory Visit (INDEPENDENT_AMBULATORY_CARE_PROVIDER_SITE_OTHER): Payer: Medicare Other | Admitting: Cardiology

## 2018-01-05 VITALS — BP 136/52 | HR 68 | Ht 69.0 in | Wt 164.0 lb

## 2018-01-05 DIAGNOSIS — I10 Essential (primary) hypertension: Secondary | ICD-10-CM | POA: Diagnosis not present

## 2018-01-05 DIAGNOSIS — I2510 Atherosclerotic heart disease of native coronary artery without angina pectoris: Secondary | ICD-10-CM

## 2018-01-05 NOTE — Patient Instructions (Signed)
Medication Instructions:  Continue current medication  If you need a refill on your cardiac medications before your next appointment, please call your pharmacy.  Labwork: None Ordered   Testing/Procedures: None Ordered  Follow-Up: Your physician wants you to follow-up in: 1 Year. You should receive a reminder letter in the mail two months in advance. If you do not receive a letter, please call our office [phone].      Thank you for choosing [facility] at [facility]!!

## 2018-01-12 ENCOUNTER — Ambulatory Visit (INDEPENDENT_AMBULATORY_CARE_PROVIDER_SITE_OTHER): Payer: Medicare Other | Admitting: Internal Medicine

## 2018-01-12 ENCOUNTER — Encounter: Payer: Self-pay | Admitting: Internal Medicine

## 2018-01-12 VITALS — BP 136/70 | HR 76 | Ht 69.0 in | Wt 164.8 lb

## 2018-01-12 DIAGNOSIS — I442 Atrioventricular block, complete: Secondary | ICD-10-CM

## 2018-01-12 DIAGNOSIS — Z950 Presence of cardiac pacemaker: Secondary | ICD-10-CM | POA: Diagnosis not present

## 2018-01-12 DIAGNOSIS — Z951 Presence of aortocoronary bypass graft: Secondary | ICD-10-CM

## 2018-01-12 DIAGNOSIS — I2510 Atherosclerotic heart disease of native coronary artery without angina pectoris: Secondary | ICD-10-CM

## 2018-01-12 DIAGNOSIS — I10 Essential (primary) hypertension: Secondary | ICD-10-CM

## 2018-01-12 NOTE — Patient Instructions (Signed)
Medication Instructions:  Your physician recommends that you continue on your current medications as directed. Please refer to the Current Medication list given to you today.  Labwork: None ordered.  Testing/Procedures: None ordered.  Follow-Up: Your physician wants you to follow-up in: 9 months with Dr. Lovena Le.   You will receive a reminder letter in the mail two months in advance. If you don't receive a letter, please call our office to schedule the follow-up appointment.  Remote monitoring is used to monitor your Pacemaker from home. This monitoring reduces the number of office visits required to check your device to one time per year. It allows [place] to keep an eye on the functioning of your device to ensure it is working properly. You are scheduled for a device check from home on 04/13/2018. You may send your transmission at any time that day. If you have a wireless device, the transmission will be sent automatically. After your physician reviews your transmission, you will receive a postcard with your next transmission date.  Any Other Special Instructions Will Be Listed Below (If Applicable).  If you need a refill on your cardiac medications before your next appointment, please call your pharmacy.

## 2018-01-12 NOTE — Progress Notes (Signed)
HPI Blake Burgess returns today for followup of CHB, s/p PPM insertion. He is a pleasant elderly man with CAD, HTN, who presented with symptomatic CHB back in February and underwent insertion of a Medtronic DDD PM with a His bundle lead placed. He has been stable from a cardiac perspective. He denies chest pain or sob. His main complaint involves hip pain. No edema.  No Known Allergies   Current Outpatient Medications  Medication Sig Dispense Refill  . acetaminophen (TYLENOL) 500 MG tablet Take 1,000 mg by mouth 2 (two) times daily.    Marland Kitchen amLODipine (NORVASC) 5 MG tablet Take 5 mg by mouth daily.    Marland Kitchen aspirin 81 MG tablet Take 1 tablet (81 mg total) by mouth daily. 30 tablet   . atorvastatin (LIPITOR) 10 MG tablet Take 10 mg by mouth at bedtime.     . Cyanocobalamin 1000 MCG/ML KIT Inject 1,000 mcg as directed every 30 (thirty) days.    . enalapril (VASOTEC) 20 MG tablet Take 20 mg by mouth daily.     . metoprolol succinate (TOPROL-XL) 50 MG 24 hr tablet Take 1 tablet (50 mg total) by mouth daily. Take with or immediately following a meal. 90 tablet 1  . omeprazole (PRILOSEC) 20 MG capsule Take 20 mg by mouth daily.     . tamsulosin (FLOMAX) 0.4 MG CAPS capsule Take 0.4 mg by mouth daily.     No current facility-administered medications for this visit.      Past Medical History:  Diagnosis Date  . Coronary artery disease    a.  s/p CABG;   b. cath 4/12: EF 55%, 3vCAD, patent L-LAD, patent S-RCA, patent S-CFX (done after a false pos. ETT)  . Diverticular disease   . GI bleed   . Hemorrhoids   . HH (hiatus hernia)   . Hypertension   . Osteoarthritis   . Other and unspecified hyperlipidemia   . Schatzki's ring   . Stroke ([place])     ROS:   All systems reviewed and negative except as noted in the HPI.   Past Surgical History:  Procedure Laterality Date  . ARTERIOVENOUS GRAFT PLACEMENT W/ ENDOSCOPIC VEIN HARVEST     of the right leg greater spahenous vein. Surgeon: Tharon Aquas  Trigt,M.D.  . COLONOSCOPY  02/24/2010   Hemorrhoids, Diverticulosis. Performed at [facility]. Normal terminal ileum. Dr. June Leap, [place] GRAFT  06/21/2007   CABG x 3 Surgeon Ivin Poot, MD  . hip replace  06/09/2004   left hip Surgeon Pietro Cassis. Alvan Dame, [place] IMPLANT N/A 10/03/2017   Procedure: PACEMAKER IMPLANT;  Surgeon: Evans Lance, MD;  Location: [facility];  Service: Cardiovascular;  Laterality: N/A;     Family History  Problem Relation Age of Onset  . Heart attack Mother   . Hypertension Mother   . Diabetes Father   . Diabetes Brother   . Diabetes Sister      Social History   Socioeconomic History  . Marital status: Married    Spouse name: Not on file  . Number of children: 2  . Years of education: Not on file  . Highest education level: Not on file  Occupational History    Employer: RETIRED  Social Needs  . Financial resource strain: Not on file  . Food insecurity:    Worry: Not on file    Inability: Not on file  . Transportation needs:  Medical: Not on file    Non-medical: Not on file  Tobacco Use  . Smoking status: Never Smoker  . Smokeless tobacco: Never Used  Substance and Sexual Activity  . Alcohol use: No  . Drug use: No  . Sexual activity: Not on file  Lifestyle  . Physical activity:    Days per week: Not on file    Minutes per session: Not on file  . Stress: Not on file  Relationships  . Social connections:    Talks on phone: Not on file    Gets together: Not on file    Attends religious service: Not on file    Active member of club or organization: Not on file    Attends meetings of clubs or organizations: Not on file    Relationship status: Not on file  . Intimate partner violence:    Fear of current or ex partner: Not on file    Emotionally abused: Not on file    Physically abused: Not on file    Forced sexual activity: Not on file  Other Topics Concern  . Not on file  Social History  Narrative   No Regular exercise. Daily Caffeine: 24 oz pepsi and 1 cup coffee.      BP 136/70   Pulse 76   Ht 5' 9"  (1.753 m)   Wt 164 lb 12.8 oz (74.8 kg)   SpO2 99%   BMI 24.34 kg/m   Physical Exam:  Well appearing 84 yo man, NAD HEENT: Unremarkable Neck:  6 cm JVD, no thyromegally Lymphatics:  No adenopathy Back:  No CVA tenderness Lungs:  Clear with no wheezes HEART:  Regular rate rhythm, no murmurs, no rubs, no clicks Abd:  soft, positive bowel sounds, no organomegally, no rebound, no guarding Ext:  2 plus pulses, no edema, no cyanosis, no clubbing Skin:  No rashes no nodules Neuro:  CN II through XII intact, motor grossly intact  EKG - NSR with ventricular pacing, QRS 100 ms  DEVICE  Normal device function.  See PaceArt for details.   Assess/Plan: 1. CHB - he is asymptomatic, s/p DDD PM insertion with a His bundle lead. 2. PPM - his medtronic DDD PM is working normally. We will follow. 3. HTN - his blood pressure is minimally elevated. Will follow. 4. CAD - he denies anginal symptoms although he is limited by his arthritic complaints.  Blake Burgess.D.

## 2018-01-13 LAB — CUP PACEART INCLINIC DEVICE CHECK
Implantable Lead Implant Date: 20190211
Implantable Lead Implant Date: 20190211
Implantable Lead Location: 753860
Implantable Lead Model: 3830
Implantable Lead Model: 5076
Implantable Pulse Generator Implant Date: 20190211
MDC IDC LEAD LOCATION: 753859
MDC IDC SESS DTM: 20190524084901

## 2018-02-03 ENCOUNTER — Ambulatory Visit: Payer: Medicare Other | Admitting: Cardiology

## 2018-04-04 ENCOUNTER — Other Ambulatory Visit: Payer: Self-pay | Admitting: Nurse Practitioner

## 2018-04-13 ENCOUNTER — Ambulatory Visit (INDEPENDENT_AMBULATORY_CARE_PROVIDER_SITE_OTHER): Payer: Medicare Other | Admitting: *Deleted

## 2018-04-13 DIAGNOSIS — I442 Atrioventricular block, complete: Secondary | ICD-10-CM

## 2018-04-13 DIAGNOSIS — R55 Syncope and collapse: Secondary | ICD-10-CM

## 2018-04-13 NOTE — Progress Notes (Signed)
Remote pacemaker transmission.   

## 2018-04-14 ENCOUNTER — Encounter: Payer: Self-pay | Admitting: Cardiology
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
## 2018-04-18 ENCOUNTER — Encounter: Payer: Self-pay | Admitting: Cardiology

## 2018-04-18 ENCOUNTER — Ambulatory Visit (INDEPENDENT_AMBULATORY_CARE_PROVIDER_SITE_OTHER): Payer: Medicare Other | Admitting: Cardiology

## 2018-04-18 VITALS — BP 132/60 | HR 64 | Ht 69.0 in | Wt 172.0 lb

## 2018-04-18 DIAGNOSIS — I517 Cardiomegaly: Secondary | ICD-10-CM

## 2018-04-18 DIAGNOSIS — I2510 Atherosclerotic heart disease of native coronary artery without angina pectoris: Secondary | ICD-10-CM | POA: Diagnosis not present

## 2018-04-18 DIAGNOSIS — I130 Hypertensive heart and chronic kidney disease with heart failure and stage 1 through stage 4 chronic kidney disease, or unspecified chronic kidney disease: Secondary | ICD-10-CM

## 2018-04-18 DIAGNOSIS — Z951 Presence of aortocoronary bypass graft: Secondary | ICD-10-CM

## 2018-04-18 DIAGNOSIS — R7989 Other specified abnormal findings of blood chemistry: Secondary | ICD-10-CM | POA: Insufficient documentation

## 2018-04-18 DIAGNOSIS — R778 Other specified abnormalities of plasma proteins: Secondary | ICD-10-CM

## 2018-04-18 DIAGNOSIS — I1310 Hypertensive heart and chronic kidney disease without heart failure, with stage 1 through stage 4 chronic kidney disease, or unspecified chronic kidney disease: Secondary | ICD-10-CM | POA: Insufficient documentation

## 2018-04-18 DIAGNOSIS — R748 Abnormal levels of other serum enzymes: Secondary | ICD-10-CM

## 2018-04-18 NOTE — Addendum Note (Signed)
Addended by: Vennie Homans on: 04/18/2018 04:25 PM   Modules accepted: Orders

## 2018-04-18 NOTE — Patient Instructions (Signed)
Medication Instructions:  Continue current medications  If you need a refill on your cardiac medications before your next appointment, please call your pharmacy.  Labwork: None Ordered   Testing/Procedures: Your physician has requested that you have a lexiscan myoview. For further information please visit [email or web address]. Please follow instruction sheet, as given.   Follow-Up: Your physician wants you to follow-up in: After Stress Test.      Thank you for choosing [facility] at [facility]!!

## 2018-04-20 ENCOUNTER — Telehealth (HOSPITAL_COMMUNITY): Payer: Self-pay

## 2018-04-20 NOTE — Telephone Encounter (Signed)
Encounter complete. 

## 2018-04-25 ENCOUNTER — Ambulatory Visit (HOSPITAL_COMMUNITY)
Admission: RE | Admit: 2018-04-25 | Discharge: 2018-04-25 | Disposition: A | Discharge status: Home / Self Care | Payer: Medicare Other | Source: Ambulatory Visit | Attending: Cardiology | Admitting: Cardiology

## 2018-04-25 DIAGNOSIS — I2510 Atherosclerotic heart disease of native coronary artery without angina pectoris: Secondary | ICD-10-CM

## 2018-04-25 DIAGNOSIS — Z951 Presence of aortocoronary bypass graft: Secondary | ICD-10-CM | POA: Insufficient documentation

## 2018-04-25 LAB — MYOCARDIAL PERFUSION IMAGING
CHL CUP NUCLEAR SSS: 5
LV dias vol: 119 mL (ref 62–150)
LV sys vol: 69 mL
Peak HR: 80 {beats}/min
Rest HR: 69 {beats}/min
SDS: 4
SRS: 1
TID: 1.05

## 2018-04-25 MED ORDER — TECHNETIUM TC 99M TETROFOSMIN IV KIT
24.5000 | PACK | Freq: Once | INTRAVENOUS | Status: AC | PRN
  Administered 2018-04-25: 24.5 via INTRAVENOUS
  Filled 2018-04-25: qty 25

## 2018-04-25 MED ORDER — REGADENOSON 0.4 MG/5ML IV SOLN
0.4000 mg | Freq: Once | INTRAVENOUS | Status: AC
  Administered 2018-04-25: 0.4 mg via INTRAVENOUS

## 2018-04-25 MED ORDER — TECHNETIUM TC 99M TETROFOSMIN IV KIT
8.0000 | PACK | Freq: Once | INTRAVENOUS | Status: AC | PRN
  Administered 2018-04-25: 8 via INTRAVENOUS
  Filled 2018-04-25: qty 8

## 2018-04-27 ENCOUNTER — Telehealth: Payer: Self-pay | Admitting: Cardiology

## 2018-04-27 NOTE — Telephone Encounter (Signed)
Follow Up:    Returning your call from yesterday, concerning his Stress Test results.

## 2018-05-01 NOTE — Telephone Encounter (Signed)
Pt aware of his result

## 2018-05-09 DIAGNOSIS — M25511 Pain in right shoulder: Secondary | ICD-10-CM | POA: Insufficient documentation

## 2018-05-09 DIAGNOSIS — M19011 Primary osteoarthritis, right shoulder: Secondary | ICD-10-CM | POA: Insufficient documentation

## 2018-05-19 LAB — CUP PACEART REMOTE DEVICE CHECK
Battery Remaining Longevity: 99 mo
Brady Statistic AP VP Percent: 17.92 %
Brady Statistic AP VS Percent: 0.01 %
Brady Statistic AS VP Percent: 80.59 %
Brady Statistic AS VS Percent: 1.47 %
Brady Statistic RA Percent Paced: 18.46 %
Brady Statistic RV Percent Paced: 98.47 %
Implantable Lead Implant Date: 20190211
Implantable Lead Implant Date: 20190211
Implantable Lead Location: 753859
Implantable Lead Location: 753860
Implantable Pulse Generator Implant Date: 20190211
Lead Channel Impedance Value: 228 Ohm
Lead Channel Impedance Value: 399 Ohm
Lead Channel Impedance Value: 475 Ohm
Lead Channel Pacing Threshold Amplitude: 0.5 V
Lead Channel Pacing Threshold Amplitude: 1.625 V
Lead Channel Pacing Threshold Pulse Width: 0.4 ms
Lead Channel Sensing Intrinsic Amplitude: 2.125 mV
Lead Channel Sensing Intrinsic Amplitude: 3.375 mV
Lead Channel Setting Pacing Amplitude: 2.5 V
Lead Channel Setting Sensing Sensitivity: 0.6 mV
MDC IDC MSMT BATTERY VOLTAGE: 3.04 V
MDC IDC MSMT LEADCHNL RA IMPEDANCE VALUE: 285 Ohm
MDC IDC MSMT LEADCHNL RA SENSING INTR AMPL: 3.375 mV
MDC IDC MSMT LEADCHNL RV PACING THRESHOLD PULSEWIDTH: 0.4 ms
MDC IDC MSMT LEADCHNL RV SENSING INTR AMPL: 2.125 mV
MDC IDC SESS DTM: 20190822113222
MDC IDC SET LEADCHNL RA PACING AMPLITUDE: 1.5 V
MDC IDC SET LEADCHNL RV PACING PULSEWIDTH: 1 ms

## 2018-05-22 ENCOUNTER — Emergency Department (HOSPITAL_COMMUNITY)
Admission: EM | Admit: 2018-05-22 | Discharge: 2018-05-22 | Disposition: A | Discharge status: Home / Self Care | Payer: Medicare Other | Attending: Emergency Medicine | Admitting: Emergency Medicine

## 2018-05-22 ENCOUNTER — Encounter (HOSPITAL_COMMUNITY): Payer: Self-pay | Admitting: Emergency Medicine

## 2018-05-22 ENCOUNTER — Emergency Department (HOSPITAL_COMMUNITY): Payer: Medicare Other

## 2018-05-22 DIAGNOSIS — I10 Essential (primary) hypertension: Secondary | ICD-10-CM | POA: Insufficient documentation

## 2018-05-22 DIAGNOSIS — I2510 Atherosclerotic heart disease of native coronary artery without angina pectoris: Secondary | ICD-10-CM | POA: Insufficient documentation

## 2018-05-22 DIAGNOSIS — Z7982 Long term (current) use of aspirin: Secondary | ICD-10-CM | POA: Insufficient documentation

## 2018-05-22 DIAGNOSIS — R0602 Shortness of breath: Secondary | ICD-10-CM | POA: Insufficient documentation

## 2018-05-22 DIAGNOSIS — Z79899 Other long term (current) drug therapy: Secondary | ICD-10-CM | POA: Diagnosis not present

## 2018-05-22 DIAGNOSIS — Z8673 Personal history of transient ischemic attack (TIA), and cerebral infarction without residual deficits: Secondary | ICD-10-CM | POA: Insufficient documentation

## 2018-05-22 DIAGNOSIS — R079 Chest pain, unspecified: Secondary | ICD-10-CM | POA: Diagnosis not present

## 2018-05-22 HISTORY — DX: Malignant (primary) neoplasm, unspecified: C80.1

## 2018-05-22 LAB — URINALYSIS, ROUTINE W REFLEX MICROSCOPIC
Bilirubin Urine: NEGATIVE
Glucose, UA: NEGATIVE mg/dL
Hgb urine dipstick: NEGATIVE
KETONES UR: NEGATIVE mg/dL
LEUKOCYTES UA: NEGATIVE
NITRITE: NEGATIVE
PH: 5 (ref 5.0–8.0)
PROTEIN: NEGATIVE mg/dL
Specific Gravity, Urine: 1.013 (ref 1.005–1.030)

## 2018-05-22 LAB — COMPREHENSIVE METABOLIC PANEL
ALT: 14 U/L (ref 0–44)
AST: 20 U/L (ref 15–41)
Albumin: 3.5 g/dL (ref 3.5–5.0)
Alkaline Phosphatase: 61 U/L (ref 38–126)
Anion gap: 5 (ref 5–15)
BUN: 24 mg/dL — AB (ref 8–23)
CHLORIDE: 108 mmol/L (ref 98–111)
CO2: 24 mmol/L (ref 22–32)
Calcium: 8.8 mg/dL — ABNORMAL LOW (ref 8.9–10.3)
Creatinine, Ser: 1.17 mg/dL (ref 0.61–1.24)
GFR calc Af Amer: >60 mL/min (ref >60)
GFR calc non Af Amer: 55 mL/min — ABNORMAL LOW (ref >60)
Glucose, Bld: 156 mg/dL — ABNORMAL HIGH (ref 70–99)
Potassium: 4.1 mmol/L (ref 3.5–5.1)
SODIUM: 137 mmol/L (ref 135–145)
Total Bilirubin: 0.6 mg/dL (ref 0.3–1.2)
Total Protein: 5.7 g/dL — ABNORMAL LOW (ref 6.5–8.1)

## 2018-05-22 LAB — I-STAT TROPONIN, ED
TROPONIN I, POC: 0 ng/mL (ref 0.00–0.08)
Troponin i, poc: 0.01 ng/mL (ref 0.00–0.08)

## 2018-05-22 LAB — CBC WITH DIFFERENTIAL/PLATELET
ABS IMMATURE GRANULOCYTES: 0.1 10*3/uL (ref 0.0–0.1)
BASOS ABS: 0 10*3/uL (ref 0.0–0.1)
BASOS PCT: 0 %
Eosinophils Absolute: 0.1 10*3/uL (ref 0.0–0.7)
Eosinophils Relative: 1 %
HCT: 34.9 % — ABNORMAL LOW (ref 39.0–52.0)
Hemoglobin: 11.1 g/dL — ABNORMAL LOW (ref 13.0–17.0)
IMMATURE GRANULOCYTES: 1 %
Lymphocytes Relative: 25 %
Lymphs Abs: 2.3 10*3/uL (ref 0.7–4.0)
MCH: 33.5 pg (ref 26.0–34.0)
MCHC: 31.8 g/dL (ref 30.0–36.0)
MCV: 105.4 fL — ABNORMAL HIGH (ref 78.0–100.0)
MONOS PCT: 9 %
Monocytes Absolute: 0.8 10*3/uL (ref 0.1–1.0)
NEUTROS ABS: 6.1 10*3/uL (ref 1.7–7.7)
NEUTROS PCT: 64 %
PLATELETS: 225 10*3/uL (ref 150–400)
RBC: 3.31 MIL/uL — AB (ref 4.22–5.81)
RDW: 12.3 % (ref 11.5–15.5)
WBC: 9.4 10*3/uL (ref 4.0–10.5)

## 2018-05-22 LAB — CK: CK TOTAL: 111 U/L (ref 49–397)

## 2018-05-22 LAB — BRAIN NATRIURETIC PEPTIDE: B Natriuretic Peptide: 278.9 pg/mL — ABNORMAL HIGH (ref 0.0–100.0)

## 2018-05-22 MED ORDER — SODIUM CHLORIDE 0.9 % IV BOLUS
500.0000 mL | Freq: Once | INTRAVENOUS | Status: AC
  Administered 2018-05-22: 500 mL via INTRAVENOUS
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
## 2018-05-22 NOTE — Discharge Instructions (Addendum)
Follow up with your family doctor.  Return to ER for any worsening or concerning symptoms.

## 2018-05-22 NOTE — ED Provider Notes (Signed)
Patient placed in Quick Look pathway, seen and evaluated   Chief Complaint: Fatigue   HPI:   IZEN PETZ is a 84 y.o. male  who presents to the Emergency Department complaining of not feeling well for the last 2 days. Patient reports that he fell on Friday. He believes his feet got tangled. He did not hit head or lose consciousness. He has been feeling tired, weak and with no energy for the last 2 days. He also reports intermittent chest pain, although none currently.    ROS: + fatigue, chest pain            - shortness of breath, fever  Physical Exam:   Gen: No distress  Neuro: Awake and Alert  Skin: Warm    Focused Exam: RRR, lungs clear. No tenderness to chest or ribs.    Initiation of care has begun. The patient has been counseled on the process, plan, and necessity for staying for the completion/evaluation, and the remainder of the medical screening examination    Courtenay Hirth, Ozella Almond, PA-C 05/22/18 [place], MD 05/23/18 [phone]

## 2018-05-22 NOTE — ED Notes (Signed)
Patient ambulatory to bathroom with steady gait at this time 

## 2018-05-22 NOTE — ED Notes (Signed)
Patient verbalizes understanding of discharge instructions. Opportunity for questioning and answers were provided. 

## 2018-05-22 NOTE — ED Triage Notes (Signed)
Pt presents with malaise, CP, and fall couple days ago while burning some brush
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
## 2018-06-05 ENCOUNTER — Ambulatory Visit (INDEPENDENT_AMBULATORY_CARE_PROVIDER_SITE_OTHER): Payer: Medicare Other | Admitting: Cardiology

## 2018-06-05 ENCOUNTER — Encounter: Payer: Self-pay | Admitting: Cardiology

## 2018-06-05 VITALS — BP 186/68 | HR 61 | Ht 69.0 in | Wt 172.0 lb

## 2018-06-05 DIAGNOSIS — I10 Essential (primary) hypertension: Secondary | ICD-10-CM

## 2018-06-05 DIAGNOSIS — I2510 Atherosclerotic heart disease of native coronary artery without angina pectoris: Secondary | ICD-10-CM | POA: Diagnosis not present

## 2018-06-05 DIAGNOSIS — E785 Hyperlipidemia, unspecified: Secondary | ICD-10-CM

## 2018-06-05 DIAGNOSIS — I5042 Chronic combined systolic (congestive) and diastolic (congestive) heart failure: Secondary | ICD-10-CM | POA: Diagnosis not present

## 2018-06-05 NOTE — Patient Instructions (Signed)
Medication Instructions:  Continue current medications  If you need a refill on your cardiac medications before your next appointment, please call your pharmacy.  Labwork: None Ordered   If you have labs (blood work) drawn today and your tests are completely normal, you will receive your results only by: Marland Kitchen MyChart Message (if you have MyChart) OR . A paper copy in the mail If you have any lab test that is abnormal or we need to change your treatment, we will call you to review the results.  Testing/Procedures: None Ordered  Special Instructions: Keep daily blood pressure check for 2 weeks  Follow-Up: You will need a follow up appointment in 6 Months.  Please call our office 2 months in advance([phone]) to schedule the appointment.  You may see  DR Percival Spanish or one of the following Advanced Practice Providers on your designated Care Team:   . Rosaria Ferries, PA-C . Jory Sims, DNP, ANP  At [facility], you and your health needs are our priority.  As part of our continuing mission to provide you with exceptional heart care, we have created designated Provider Care Teams.  These Care Teams include your primary Cardiologist (physician) and Advanced Practice Providers (APPs -  Physician Assistants and Nurse Practitioners) who all work together to provide you with the care you need, when you need it.   Thank you for choosing [facility] at [facility]!!

## 2018-06-21 ENCOUNTER — Telehealth: Payer: Self-pay | Admitting: Cardiology

## 2018-06-21 ENCOUNTER — Other Ambulatory Visit: Payer: Self-pay | Admitting: Orthopedic Surgery

## 2018-06-21 DIAGNOSIS — M19011 Primary osteoarthritis, right shoulder: Secondary | ICD-10-CM

## 2018-06-21 NOTE — Telephone Encounter (Signed)
Patient's son dropped off BP readings for MD to review. On the walk-in form, there is an additional note that patient would like "approval to have shoulder replacement surgery"  Called patient and spoke with wife Blake Burgess (patient is in [place] for ortho appt for shoulder eval). Explained that once surgery is tentative/scheduled, the surgeon will send a clearance request to Dr. Rosezella [place] office to review and advise. She will relay this to patient.   BP readings placed in MD mail box

## 2018-06-26 ENCOUNTER — Telehealth: Payer: Self-pay | Admitting: *Deleted

## 2018-06-26 MED ORDER — AMLODIPINE BESYLATE 5 MG PO TABS: 5.0000 mg | ORAL_TABLET | Freq: Every day | ORAL | 2 refills | Status: DC

## 2018-06-26 NOTE — Telephone Encounter (Signed)
Call  and spoke with pt about the blood pressure reading pt dropped off at office, advised pt Dr Percival Spanish recommend he increase his Amlodipine 5 mg daily, new rx was send into pt [facility] in [place], pt voice understanding with medication changes.

## 2018-06-28 ENCOUNTER — Ambulatory Visit
Admission: RE | Admit: 2018-06-28 | Discharge: 2018-06-28 | Disposition: A | Discharge status: Home / Self Care | Payer: Medicare Other | Source: Ambulatory Visit | Attending: Orthopedic Surgery | Admitting: Orthopedic Surgery

## 2018-06-28 DIAGNOSIS — M19011 Primary osteoarthritis, right shoulder: Secondary | ICD-10-CM
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
## 2018-07-13 ENCOUNTER — Ambulatory Visit (INDEPENDENT_AMBULATORY_CARE_PROVIDER_SITE_OTHER): Payer: Medicare Other

## 2018-07-13 ENCOUNTER — Telehealth: Payer: Self-pay

## 2018-07-13 ENCOUNTER — Encounter: Payer: Self-pay | Admitting: Cardiology

## 2018-07-13 ENCOUNTER — Ambulatory Visit (INDEPENDENT_AMBULATORY_CARE_PROVIDER_SITE_OTHER): Payer: Medicare Other | Admitting: Cardiology

## 2018-07-13 VITALS — BP 141/58 | HR 74 | Ht 69.0 in | Wt 174.0 lb

## 2018-07-13 DIAGNOSIS — I442 Atrioventricular block, complete: Secondary | ICD-10-CM

## 2018-07-13 DIAGNOSIS — I5032 Chronic diastolic (congestive) heart failure: Secondary | ICD-10-CM | POA: Diagnosis not present

## 2018-07-13 DIAGNOSIS — Z01810 Encounter for preprocedural cardiovascular examination: Secondary | ICD-10-CM | POA: Diagnosis not present

## 2018-07-13 DIAGNOSIS — E785 Hyperlipidemia, unspecified: Secondary | ICD-10-CM | POA: Diagnosis not present

## 2018-07-13 DIAGNOSIS — I2510 Atherosclerotic heart disease of native coronary artery without angina pectoris: Secondary | ICD-10-CM | POA: Diagnosis not present

## 2018-07-13 NOTE — Telephone Encounter (Signed)
Spoke with pt and reminded pt of remote transmission that is due today. Pt verbalized understanding.   

## 2018-07-13 NOTE — Patient Instructions (Signed)

## 2018-07-14 ENCOUNTER — Encounter: Payer: Self-pay | Admitting: Cardiology

## 2018-07-14 NOTE — Progress Notes (Signed)
Remote pacemaker transmission.   

## 2018-07-17 ENCOUNTER — Telehealth: Payer: Self-pay | Admitting: *Deleted

## 2018-07-17 NOTE — Telephone Encounter (Signed)
   [facility] Pre-operative Risk Assessment    Request for surgical clearance:  1. What type of surgery is being performed? RIGHT REVERSE TOTAL SHOULDER  2. When is this surgery scheduled? TBD  3. What type of clearance is required (medical clearance vs. Pharmacy clearance to hold med vs. Both)? BOTH  4. Are there any medications that need to be held prior to surgery and how long? ASA 81 MG  5. Practice name and name of physician performing surgery?  [facility]  DR NORR[facility]  6. What is your office phone number [phone]   7.   What is your office fax number [phone]  8.   Anesthesia type (None, local, MAC, general) ? CHOICE   Blake Burgess 07/17/2018, 1:49 PM  _________________________________________________________________   (provider comments below)

## 2018-07-18 NOTE — Telephone Encounter (Signed)
Patient just seen by Dr. Percival Spanish last week and cleared for surgery.  No mention of ASA in note.  It should be ok to hold but will route to him to clarify if ASA can be held for the procedure.  Dr. Percival Spanish: Is it ok to hold ASA for 7 days for shoulder surgery?  Richardson Dopp, PA-C    07/18/2018 4:29 PM

## 2018-07-19 NOTE — Telephone Encounter (Signed)
I would prefer that he not hold the ASA unless the risk of bleeding with this surgery is prohibitive.

## 2018-07-19 NOTE — Telephone Encounter (Signed)
   Call back staff: Marland Kitchen Clearance letter has been faxed to the requesting surgeon. . Please contact the surgeon's office to ensure it has been received. . This phone note will be removed from the preop pool. . Please sign encounter when completed.  Richardson Dopp, PA-C    07/19/2018 3:13 PM

## 2018-08-14 ENCOUNTER — Other Ambulatory Visit: Payer: Self-pay

## 2018-08-14 ENCOUNTER — Encounter (HOSPITAL_COMMUNITY): Payer: Self-pay | Admitting: *Deleted

## 2018-08-14 NOTE — H&P (Signed)
Patient's anticipated LOS is less than 2 midnights, meeting these requirements: - Younger than 82 - Lives within 1 hour of care - Has a competent adult at home to recover with post-op recover - NO history of  - Chronic pain requiring opiods  - Diabetes  - Coronary Artery Disease  - Heart failure  - Heart attack  - Stroke  - DVT/VTE  - Cardiac arrhythmia  - Respiratory Failure/COPD  - Renal failure  - Anemia  - Advanced Liver disease       Blake Burgess is an 84 y.o. male.    Chief Complaint: right shoulder pain  HPI: Pt is a 84 y.o. male complaining of right shoulder pain for multiple years. Pain had continually increased since the beginning. X-rays in the clinic show end-stage arthritic changes of the right shoulder. Pt has tried various conservative treatments which have failed to alleviate their symptoms, including injections and therapy. Various options are discussed with the patient. Risks, benefits and expectations were discussed with the patient. Patient understand the risks, benefits and expectations and wishes to proceed with surgery.   PCP:  Lacie Draft, NP  D/C Plans: Home  PMH: Past Medical History:  Diagnosis Date  . Cancer ([place])    skin  . Coronary artery disease    a.  s/p CABG;   b. cath 4/12: EF 55%, 3vCAD, patent L-LAD, patent S-RCA, patent S-CFX (done after a false pos. ETT)  . Diverticular disease   . GERD (gastroesophageal reflux disease)   . GI bleed   . Hemorrhoids   . HH (hiatus hernia)   . History of kidney stones   . Hypertension   . Osteoarthritis   . Other and unspecified hyperlipidemia   . Presence of permanent cardiac pacemaker   . Schatzki's ring   . Stroke [facility])     PSH: Past Surgical History:  Procedure Laterality Date  . ARTERIOVENOUS GRAFT PLACEMENT W/ ENDOSCOPIC VEIN HARVEST     of the right leg greater spahenous vein. Surgeon: Tharon Aquas Trigt,M.D.  . COLONOSCOPY  02/24/2010   Hemorrhoids, Diverticulosis. Performed at  [facility]. Normal terminal ileum. Dr. June Leap, [place] GRAFT  06/21/2007   CABG x 3 Surgeon Ivin Poot, [place]  . EYE SURGERY     bilateral cataract removal  . hip replace  06/09/2004   left hip Surgeon Pietro Cassis. Alvan Dame, [place] IMPLANT N/A 10/03/2017   Procedure: PACEMAKER IMPLANT;  Surgeon: Evans Lance, MD;  Location: [facility];  Service: Cardiovascular;  Laterality: N/A;    Social History:  reports that he has never smoked. He has never used smokeless tobacco. He reports that he does not drink alcohol or use drugs.  Allergies:  No Known Allergies  Medications: No current facility-administered medications for this encounter.    Current Outpatient Medications  Medication Sig Dispense Refill  . acetaminophen (TYLENOL) 500 MG tablet Take 1,000 mg by mouth 3 (three) times daily.     Marland Kitchen amLODipine (NORVASC) 5 MG tablet Take 1 tablet (5 mg total) by mouth daily. 90 tablet 2  . aspirin 81 MG tablet Take 1 tablet (81 mg total) by mouth daily. 30 tablet   . atorvastatin (LIPITOR) 10 MG tablet Take 10 mg by mouth daily.     . Cyanocobalamin 1000 MCG/ML KIT Inject 1,000 mcg as directed every 30 (thirty) days.    . enalapril (VASOTEC) 20 MG tablet Take 20 mg by mouth  daily.     . meloxicam (MOBIC) 7.5 MG tablet Take 7.5 mg by mouth daily.     . metoprolol succinate (TOPROL-XL) 25 MG 24 hr tablet Take 25 mg by mouth daily.    Marland Kitchen omeprazole (PRILOSEC) 20 MG capsule Take 20 mg by mouth daily.     . tamsulosin (FLOMAX) 0.4 MG CAPS capsule Take 0.4 mg by mouth daily.    Marland Kitchen triamcinolone cream (KENALOG) 0.5 % Apply 1 application topically daily as needed (skin sores).      No results found for this or any previous visit (from the past 48 hour(s)). No results found.  ROS: Pain with rom of the right upper extremity  Physical Exam: Alert and oriented 84 y.o. male in no acute distress Cranial nerves 2-12 intact Cervical spine: full rom with no  tenderness, nv intact distally Chest: active breath sounds bilaterally, no wheeze rhonchi or rales Heart: regular rate and rhythm, no murmur Abd: non tender non distended with active bowel sounds Hip is stable with rom  Right shoulder with limited rom and strength due to arthropathy nv intact distally No rashes or edema  Assessment/Plan Assessment: right shoulder cuff arthropathy  Plan:  Patient will undergo a right reverse total shoulder by [facility] Veverly Fells [facility] Risks benefits and expectations were discussed with the patient. Patient understand risks, benefits and expectations and wishes to proceed. Preoperative templating of the joint replacement has been completed, documented, and submitted to the Operating Room personnel in order to optimize intra-operative equipment management.   Merla Riches PA-C, MPAS [facility] is now [employer] [address], [place], [place],  [address] Phone: [phone] [email or web address] Facebook  [employer]

## 2018-08-14 NOTE — Progress Notes (Signed)
Spoke with pt for pre-op call. Pt has hx of CABG in 2008 and a Pacemaker placed in Feb. 2019. Pt's cardiologist is Dr. Percival Spanish and he has give clearance for pt. Dr. Lovena Le follows his pacemaker. Pt states he is not diabetic. Pt was instructed by Dr. Rosezella [place] office not to stop his Aspirin.

## 2018-08-15 ENCOUNTER — Encounter (HOSPITAL_COMMUNITY): Payer: Self-pay | Admitting: Vascular Surgery

## 2018-08-15 NOTE — Anesthesia Preprocedure Evaluation (Addendum)
Anesthesia Evaluation  Patient identified by MRN, date of birth, ID band Patient awake    Reviewed: Allergy & Precautions, H&P , NPO status , Patient's Chart, lab work & pertinent test results  Airway Mallampati: II   Neck ROM: full    Dental   Pulmonary neg pulmonary ROS,    breath sounds clear to auscultation       Cardiovascular hypertension, + CAD, + CABG and +CHF  + dysrhythmias + pacemaker + Valvular Problems/Murmurs AS  Rhythm:regular Rate:Normal  Mild AS. Pacemaker for 3rd degree block   Neuro/Psych    GI/Hepatic hiatal hernia, GERD  ,  Endo/Other    Renal/GU      Musculoskeletal  (+) Arthritis ,   Abdominal   Peds  Hematology   Anesthesia Other Findings   Reproductive/Obstetrics                           Anesthesia Physical Anesthesia Plan  ASA: III  Anesthesia Plan: General   Post-op Pain Management:  Regional for Post-op pain   Induction: Intravenous  PONV Risk Score and Plan: 2 and Ondansetron, Dexamethasone and Treatment may vary due to age or medical condition  Airway Management Planned: Oral ETT  Additional Equipment:   Intra-op Plan:   Post-operative Plan: Extubation in OR  Informed Consent: I have reviewed the patients History and Physical, chart, labs and discussed the procedure including the risks, benefits and alternatives for the proposed anesthesia with the patient or authorized representative who has indicated his/her understanding and acceptance.     Plan Discussed with: CRNA, Anesthesiologist and Surgeon  Anesthesia Plan Comments: (PAT note written 08/15/2018 by Myra Gianotti, PA-C. [employer] PPM )       Anesthesia Quick Evaluation

## 2018-08-15 NOTE — Progress Notes (Signed)
error 

## 2018-08-15 NOTE — Progress Notes (Signed)
Anesthesia Chart Review: Blake Burgess   Case:  [id number] Date/Time:  08/25/18 1321   Procedure:  REVERSE SHOULDER ARTHROPLASTY (Right )   Anesthesia type:  Choice   Pre-op diagnosis:  Right shoulder end stage osteoarthritis, rotator cuff arthropathy   Location:  [facility] OR ROOM 07 / [place] [facility]   Surgeon:  Netta Cedars, MD      DISCUSSION: Patient is an 84 year old male scheduled for the above procedure.  History includes never smoker, CAD (s/p CABG: LIMA-LAD, SVG-OM, SVG-RCA 06/21/07; LHC 11/27/10 due to false positive stress test showed 3/3 patent grafts), pacemaker (10/03/17, Medtronic), AS (mild by 09/2017 echo), HTN, HLD, hiatal hernia GERD, CVA.  - Admitted 09/30/17-10/04/17 for worsening SOB and intermittent chest pain. EKG showed possible complete heart block and bigeminy PVCs. EP consulted. Patient in 2-1 heart block. PPM recommended.Troponins negative. Medtronic dual chamber PPM implanted 10/03/17.  On 07/13/18, cardiologist Dr. Percival Spanish wrote,  "PREOP:The patient has no high risk findings from a cardiovascular standpoint. He did not have high risk findings on stress testing earlier this year. This is not a high risk procedure and he has a good functional level. Modified Lee criteria would put him at low risk though given his age and mild abnormality on stress perfusion it is intermediate risk for cardiovascular events. However, I think this is quite acceptable and I had this discussion with the patient and his son. He is very uncomfortable with his shoulder and wants to proceed for symptomatic relief. According to ACC/AHA guidelines no further cardiovascular testing would be suggested. He does have a pacemaker and is in complete heart block with 100% LV pacing and would need to have appropriate management pre-intra-and postoperatively." He preferred patient not to hold ASA unless the risk of bleeding with surgery prohibitive.  Medtronic PPM perioperative device form is pending for  [facility] EP. Device is located along left chest per CXR. Patient will need updated labs prior to surgery. If acceptable and no acute changes then I anticipate that he can proceed as planned.   PROVIDERS: Lacie Draft, NP is PCP - Minus Breeding, MD is primary cardiologist. Last visit 07/13/18 for follow-up and preoperative evaluation.  - Cristopher Peru, MD is EP cardiologist. Last visit 01/12/18. Medtronic DD PM noted to be working normally. Patient asymptomatic.   LABS: Patient will need updated labs prior to surgery. Latest labs in Epic show: Lab Results  Component Value Date   WBC 9.4 05/22/2018   HGB 11.1 (L) 05/22/2018   HCT 34.9 (L) 05/22/2018   PLT 225 05/22/2018   GLUCOSE 156 (H) 05/22/2018   ALT 14 05/22/2018   AST 20 05/22/2018   NA 137 05/22/2018   K 4.1 05/22/2018   CL 108 05/22/2018   CREATININE 1.17 05/22/2018   BUN 24 (H) 05/22/2018   CO2 24 05/22/2018    IMAGES: CXR 05/22/18: IMPRESSION: 1.  No acute cardiopulmonary abnormality. 2.  Aortic Atherosclerosis (ICD10-I70.0).   EKG: 05/22/18: NSR. Pacing spikes present.   CV:  Nuclear stress test 04/25/18 Nuclear stress EF: 42%. The left ventricular ejection fraction is moderately decreased (30-44%).  There was no ST segment deviation noted during stress.  Defect 1: There is a small defect of mild severity present in the apical anterior, apical septal and apex location.  Findings consistent with a small area of anterior apical ischemia.  This is an intermediate risk study. (Results reviewed by Dr. Percival Spanish who wrote, "This was not a high risk study. There might be some  mild apical ischemia. EF might be slightly low. However, on echo earlier this year at [facility] the EF was normal. I don't think, in the absence of symptoms, that further testing is indicated. I would follow this clinically.")   Echo 10/01/17: Study Conclusions - Left ventricle: Abnormal septal motion The cavity size was mildly    dilated. Wall thickness was increased in a pattern of mild LVH.   Systolic function was normal. The estimated ejection fraction was   in the range of 50% to 55%. Doppler parameters are consistent   with abnormal left ventricular relaxation (grade 1 diastolic   dysfunction). - Aortic valve: There was mild stenosis. Valve area (VTI): 1.88   cm^2. Valve area (Vmax): 1.95 cm^2. Valve area (Vmean): 1.98   cm^2. - Mitral valve: Calcified annulus. Moderately thickened, moderately   calcified leaflets . There was mild regurgitation. - Left atrium: The atrium was mildly dilated. - Atrial septum: No defect or patent foramen ovale was identified.  Cardiac cath 11/27/10: ASSESSMENT: 1. Three-vessel coronary artery disease. 2. Intact revascularization with 3 out of 3 bypass grafts patent. 3. Left ventricular ejection fraction of 50-55% with moderately     elevated filling pressures. PLAN/DISCUSSION:  Based on his angiography, I suspect his stress test is a false positive.  We will continue with aggressive medical therapy.  Carotid U/S 10/21/14: Summary: - Bilateral proximal ICAs: Demonstrate a mild amount of fibrous plaque without evidence of a significant stenosis. - Right ECA: Greater than 50% diameter reduction by velocity.  This may be the source of bruit. - Bilateral vertebral arteries: Normal antegrade flow. There are no prior studies available for comparison.  Follow-up studies are recommended when clinically indicated.   Past Medical History:  Diagnosis Date  . Aortic stenosis    mild AS 09/2017 echo  . Cancer ([place])    skin  . Coronary artery disease    a.  s/p CABG;   b. cath 4/12: EF 55%, 3vCAD, patent L-LAD, patent S-RCA, patent S-CFX (done after a false pos. ETT)  . Diverticular disease   . GERD (gastroesophageal reflux disease)   . GI bleed   . Hemorrhoids   . HH (hiatus hernia)   . History of kidney stones   . Hypertension   . Osteoarthritis   . Other and unspecified  hyperlipidemia   . Presence of permanent cardiac pacemaker   . Schatzki's ring   . Stroke [facility])     Past Surgical History:  Procedure Laterality Date  . ARTERIOVENOUS GRAFT PLACEMENT W/ ENDOSCOPIC VEIN HARVEST     of the right leg greater spahenous vein. Surgeon: Tharon Aquas Trigt,M.D.  . COLONOSCOPY  02/24/2010   Hemorrhoids, Diverticulosis. Performed at [facility]. Normal terminal ileum. Dr. June Leap, [place] GRAFT  06/21/2007   CABG x 3 Surgeon Ivin Poot, MD  . EYE SURGERY     bilateral cataract removal  . hip replace  06/09/2004   left hip Surgeon Pietro Cassis. Alvan Dame, [place] IMPLANT N/A 10/03/2017   Procedure: PACEMAKER IMPLANT;  Surgeon: Evans Lance, MD;  Location: [facility];  Service: Cardiovascular;  Laterality: N/A;    MEDICATIONS: No current facility-administered medications for this encounter.    Marland Kitchen acetaminophen (TYLENOL) 500 MG tablet  . amLODipine (NORVASC) 5 MG tablet  . aspirin 81 MG tablet  . atorvastatin (LIPITOR) 10 MG tablet  . Cyanocobalamin 1000 MCG/ML KIT  . enalapril (VASOTEC) 20 MG tablet  . meloxicam (  MOBIC) 7.5 MG tablet  . metoprolol succinate (TOPROL-XL) 25 MG 24 hr tablet  . omeprazole (PRILOSEC) 20 MG capsule  . tamsulosin (FLOMAX) 0.4 MG CAPS capsule  . triamcinolone cream (KENALOG) 0.5 %    George Hugh [facility] Short Stay Center/Anesthesiology Phone [phone] 08/15/2018 12:17 PM

## 2018-08-25 ENCOUNTER — Other Ambulatory Visit: Payer: Self-pay

## 2018-08-25 ENCOUNTER — Inpatient Hospital Stay (HOSPITAL_COMMUNITY)
Admission: RE | Admit: 2018-08-25 | Discharge: 2018-08-26 | DRG: 483 | Disposition: A | Discharge status: Home Health | Payer: Medicare Other | Attending: Orthopedic Surgery | Admitting: Orthopedic Surgery

## 2018-08-25 ENCOUNTER — Encounter (HOSPITAL_COMMUNITY): Payer: Self-pay

## 2018-08-25 ENCOUNTER — Encounter (HOSPITAL_COMMUNITY)
Admission: RE | Disposition: A | Discharge status: Home Health | Payer: Self-pay | Source: Home / Self Care | Attending: Orthopedic Surgery

## 2018-08-25 ENCOUNTER — Inpatient Hospital Stay (HOSPITAL_COMMUNITY): Payer: Medicare Other | Admitting: Vascular Surgery

## 2018-08-25 ENCOUNTER — Inpatient Hospital Stay (HOSPITAL_COMMUNITY): Payer: Medicare Other

## 2018-08-25 DIAGNOSIS — Z96611 Presence of right artificial shoulder joint: Secondary | ICD-10-CM

## 2018-08-25 DIAGNOSIS — Z791 Long term (current) use of non-steroidal anti-inflammatories (NSAID): Secondary | ICD-10-CM | POA: Diagnosis not present

## 2018-08-25 DIAGNOSIS — M75101 Unspecified rotator cuff tear or rupture of right shoulder, not specified as traumatic: Principal | ICD-10-CM | POA: Diagnosis present

## 2018-08-25 DIAGNOSIS — Z85828 Personal history of other malignant neoplasm of skin: Secondary | ICD-10-CM

## 2018-08-25 DIAGNOSIS — K219 Gastro-esophageal reflux disease without esophagitis: Secondary | ICD-10-CM | POA: Diagnosis present

## 2018-08-25 DIAGNOSIS — Z7982 Long term (current) use of aspirin: Secondary | ICD-10-CM | POA: Diagnosis not present

## 2018-08-25 DIAGNOSIS — Z950 Presence of cardiac pacemaker: Secondary | ICD-10-CM

## 2018-08-25 DIAGNOSIS — E785 Hyperlipidemia, unspecified: Secondary | ICD-10-CM | POA: Diagnosis present

## 2018-08-25 DIAGNOSIS — Z79899 Other long term (current) drug therapy: Secondary | ICD-10-CM

## 2018-08-25 DIAGNOSIS — M19011 Primary osteoarthritis, right shoulder: Secondary | ICD-10-CM | POA: Diagnosis present

## 2018-08-25 DIAGNOSIS — I10 Essential (primary) hypertension: Secondary | ICD-10-CM | POA: Diagnosis present

## 2018-08-25 DIAGNOSIS — Z8673 Personal history of transient ischemic attack (TIA), and cerebral infarction without residual deficits: Secondary | ICD-10-CM | POA: Diagnosis not present

## 2018-08-25 DIAGNOSIS — R0602 Shortness of breath: Secondary | ICD-10-CM

## 2018-08-25 DIAGNOSIS — I350 Nonrheumatic aortic (valve) stenosis: Secondary | ICD-10-CM | POA: Diagnosis present

## 2018-08-25 DIAGNOSIS — I2510 Atherosclerotic heart disease of native coronary artery without angina pectoris: Secondary | ICD-10-CM | POA: Diagnosis present

## 2018-08-25 DIAGNOSIS — Z951 Presence of aortocoronary bypass graft: Secondary | ICD-10-CM

## 2018-08-25 DIAGNOSIS — Z9660 Presence of unspecified orthopedic joint implant: Secondary | ICD-10-CM | POA: Insufficient documentation

## 2018-08-25 HISTORY — DX: Presence of cardiac pacemaker: Z95.0

## 2018-08-25 HISTORY — PX: REVERSE SHOULDER ARTHROPLASTY: SHX5054

## 2018-08-25 HISTORY — DX: Nonrheumatic aortic (valve) stenosis: I35.0

## 2018-08-25 HISTORY — DX: Gastro-esophageal reflux disease without esophagitis: K21.9

## 2018-08-25 HISTORY — DX: Personal history of urinary calculi: Z87.442

## 2018-08-25 LAB — CBC
HCT: 33.7 % — ABNORMAL LOW (ref 39.0–52.0)
HEMOGLOBIN: 10.6 g/dL — AB (ref 13.0–17.0)
MCH: 31.8 pg (ref 26.0–34.0)
MCHC: 31.5 g/dL (ref 30.0–36.0)
MCV: 101.2 fL — ABNORMAL HIGH (ref 80.0–100.0)
Platelets: 280 10*3/uL (ref 150–400)
RBC: 3.33 MIL/uL — ABNORMAL LOW (ref 4.22–5.81)
RDW: 11.8 % (ref 11.5–15.5)
WBC: 7 10*3/uL (ref 4.0–10.5)
nRBC: 0 % (ref 0.0–0.2)

## 2018-08-25 LAB — BASIC METABOLIC PANEL
Anion gap: 7 (ref 5–15)
BUN: 17 mg/dL (ref 8–23)
CO2: 25 mmol/L (ref 22–32)
Calcium: 8.7 mg/dL — ABNORMAL LOW (ref 8.9–10.3)
Chloride: 108 mmol/L (ref 98–111)
Creatinine, Ser: 1.13 mg/dL (ref 0.61–1.24)
GFR calc Af Amer: >60 mL/min (ref >60)
GFR calc non Af Amer: 59 mL/min — ABNORMAL LOW (ref >60)
Glucose, Bld: 134 mg/dL — ABNORMAL HIGH (ref 70–99)
Potassium: 3.9 mmol/L (ref 3.5–5.1)
Sodium: 140 mmol/L (ref 135–145)

## 2018-08-25 SURGERY — ARTHROPLASTY, SHOULDER, TOTAL, REVERSE
Anesthesia: General | Laterality: Right

## 2018-08-25 MED ORDER — FENTANYL CITRATE (PF) 250 MCG/5ML IJ SOLN
INTRAMUSCULAR | Status: AC
  Filled 2018-08-25: qty 5

## 2018-08-25 MED ORDER — MENTHOL 3 MG MT LOZG: 1.0000 | LOZENGE | OROMUCOSAL | Status: DC | PRN

## 2018-08-25 MED ORDER — DEXAMETHASONE SODIUM PHOSPHATE 10 MG/ML IJ SOLN
INTRAMUSCULAR | Status: AC
  Filled 2018-08-25: qty 1

## 2018-08-25 MED ORDER — ASPIRIN EC 81 MG PO TBEC: 81.0000 mg | DELAYED_RELEASE_TABLET | Freq: Every day | ORAL | Status: DC

## 2018-08-25 MED ORDER — DEXAMETHASONE SODIUM PHOSPHATE 10 MG/ML IJ SOLN
INTRAMUSCULAR | Status: DC | PRN
  Administered 2018-08-25: 5 mg via INTRAVENOUS

## 2018-08-25 MED ORDER — ONDANSETRON HCL 4 MG/2ML IJ SOLN: 4.0000 mg | Freq: Four times a day (QID) | INTRAMUSCULAR | Status: DC | PRN

## 2018-08-25 MED ORDER — DOCUSATE SODIUM 100 MG PO CAPS
100.0000 mg | ORAL_CAPSULE | Freq: Two times a day (BID) | ORAL | Status: DC
  Administered 2018-08-25 (×2): 100 mg via ORAL
  Filled 2018-08-25 (×2): qty 1

## 2018-08-25 MED ORDER — SUCCINYLCHOLINE CHLORIDE 200 MG/10ML IV SOSY
PREFILLED_SYRINGE | INTRAVENOUS | Status: AC
  Filled 2018-08-25: qty 10

## 2018-08-25 MED ORDER — BUPIVACAINE HCL (PF) 0.5 % IJ SOLN: INTRAMUSCULAR | Status: DC | PRN

## 2018-08-25 MED ORDER — TRAMADOL HCL 50 MG PO TABS
50.0000 mg | ORAL_TABLET | Freq: Four times a day (QID) | ORAL | Status: DC | PRN
  Administered 2018-08-26 (×2): 50 mg via ORAL
  Filled 2018-08-25 (×2): qty 1

## 2018-08-25 MED ORDER — PHENYLEPHRINE 40 MCG/ML (10ML) SYRINGE FOR IV PUSH (FOR BLOOD PRESSURE SUPPORT)
PREFILLED_SYRINGE | INTRAVENOUS | Status: AC
  Filled 2018-08-25: qty 10

## 2018-08-25 MED ORDER — PROPOFOL 10 MG/ML IV BOLUS
INTRAVENOUS | Status: DC | PRN
  Administered 2018-08-25: 110 mg via INTRAVENOUS

## 2018-08-25 MED ORDER — LIDOCAINE 2% (20 MG/ML) 5 ML SYRINGE
INTRAMUSCULAR | Status: AC
  Filled 2018-08-25: qty 5

## 2018-08-25 MED ORDER — TRAMADOL HCL 50 MG PO TABS: 50.0000 mg | ORAL_TABLET | Freq: Four times a day (QID) | ORAL | 0 refills | Status: DC | PRN

## 2018-08-25 MED ORDER — CYANOCOBALAMIN 1000 MCG/ML IJ KIT: 1000.0000 ug | PACK | INTRAMUSCULAR | Status: DC

## 2018-08-25 MED ORDER — OXYCODONE HCL 5 MG/5ML PO SOLN: 5.0000 mg | Freq: Once | ORAL | Status: DC | PRN

## 2018-08-25 MED ORDER — ROCURONIUM BROMIDE 50 MG/5ML IV SOSY
PREFILLED_SYRINGE | INTRAVENOUS | Status: AC
  Filled 2018-08-25: qty 5

## 2018-08-25 MED ORDER — 0.9 % SODIUM CHLORIDE (POUR BTL) OPTIME
TOPICAL | Status: DC | PRN
  Administered 2018-08-25: 1000 mL

## 2018-08-25 MED ORDER — METOCLOPRAMIDE HCL 5 MG/ML IJ SOLN: 5.0000 mg | Freq: Three times a day (TID) | INTRAMUSCULAR | Status: DC | PRN

## 2018-08-25 MED ORDER — OXYCODONE HCL 5 MG PO TABS: 5.0000 mg | ORAL_TABLET | Freq: Once | ORAL | Status: DC | PRN

## 2018-08-25 MED ORDER — FENTANYL CITRATE (PF) 100 MCG/2ML IJ SOLN: 25.0000 ug | INTRAMUSCULAR | Status: DC | PRN

## 2018-08-25 MED ORDER — BUPIVACAINE-EPINEPHRINE (PF) 0.25% -1:200000 IJ SOLN
INTRAMUSCULAR | Status: AC
  Filled 2018-08-25: qty 30

## 2018-08-25 MED ORDER — ONDANSETRON HCL 4 MG PO TABS: 4.0000 mg | ORAL_TABLET | Freq: Four times a day (QID) | ORAL | Status: DC | PRN

## 2018-08-25 MED ORDER — ACETAMINOPHEN 500 MG PO TABS
1000.0000 mg | ORAL_TABLET | Freq: Three times a day (TID) | ORAL | Status: DC
  Administered 2018-08-25 (×2): 1000 mg via ORAL
  Filled 2018-08-25 (×2): qty 2

## 2018-08-25 MED ORDER — ONDANSETRON HCL 4 MG/2ML IJ SOLN
INTRAMUSCULAR | Status: AC
  Filled 2018-08-25: qty 2

## 2018-08-25 MED ORDER — LACTATED RINGERS IV SOLN
INTRAVENOUS | Status: DC | PRN
  Administered 2018-08-25: 07:00:00 via INTRAVENOUS

## 2018-08-25 MED ORDER — BUPIVACAINE HCL (PF) 0.5 % IJ SOLN
INTRAMUSCULAR | Status: DC | PRN
  Administered 2018-08-25: 15 mL via PERINEURAL

## 2018-08-25 MED ORDER — ONDANSETRON HCL 4 MG/2ML IJ SOLN
INTRAMUSCULAR | Status: DC | PRN
  Administered 2018-08-25: 4 mg via INTRAVENOUS

## 2018-08-25 MED ORDER — TAMSULOSIN HCL 0.4 MG PO CAPS: 0.4000 mg | ORAL_CAPSULE | Freq: Every day | ORAL | Status: DC

## 2018-08-25 MED ORDER — ROCURONIUM BROMIDE 10 MG/ML (PF) SYRINGE
PREFILLED_SYRINGE | INTRAVENOUS | Status: DC | PRN
  Administered 2018-08-25: 50 mg via INTRAVENOUS

## 2018-08-25 MED ORDER — TRIAMCINOLONE ACETONIDE 0.5 % EX CREA
1.0000 "application " | TOPICAL_CREAM | Freq: Every day | CUTANEOUS | Status: DC | PRN
  Filled 2018-08-25: qty 15

## 2018-08-25 MED ORDER — PHENOL 1.4 % MT LIQD: 1.0000 | OROMUCOSAL | Status: DC | PRN

## 2018-08-25 MED ORDER — FENTANYL CITRATE (PF) 250 MCG/5ML IJ SOLN
INTRAMUSCULAR | Status: DC | PRN
  Administered 2018-08-25 (×2): 25 ug via INTRAVENOUS

## 2018-08-25 MED ORDER — ATORVASTATIN CALCIUM 10 MG PO TABS
10.0000 mg | ORAL_TABLET | Freq: Every day | ORAL | Status: DC
  Administered 2018-08-25: 10 mg via ORAL
  Filled 2018-08-25: qty 1

## 2018-08-25 MED ORDER — BUPIVACAINE-EPINEPHRINE 0.25% -1:200000 IJ SOLN
INTRAMUSCULAR | Status: DC | PRN
  Administered 2018-08-25: 9 mL

## 2018-08-25 MED ORDER — SODIUM CHLORIDE 0.9 % IV SOLN: INTRAVENOUS | Status: DC

## 2018-08-25 MED ORDER — AMLODIPINE BESYLATE 5 MG PO TABS: 5.0000 mg | ORAL_TABLET | Freq: Every day | ORAL | Status: DC

## 2018-08-25 MED ORDER — CEFAZOLIN SODIUM-DEXTROSE 2-4 GM/100ML-% IV SOLN
2.0000 g | Freq: Four times a day (QID) | INTRAVENOUS | Status: AC
  Administered 2018-08-25 – 2018-08-26 (×3): 2 g via INTRAVENOUS
  Filled 2018-08-25 (×3): qty 100

## 2018-08-25 MED ORDER — PANTOPRAZOLE SODIUM 40 MG PO TBEC: 40.0000 mg | DELAYED_RELEASE_TABLET | Freq: Every day | ORAL | Status: DC

## 2018-08-25 MED ORDER — METOCLOPRAMIDE HCL 5 MG PO TABS: 5.0000 mg | ORAL_TABLET | Freq: Three times a day (TID) | ORAL | Status: DC | PRN

## 2018-08-25 MED ORDER — SODIUM CHLORIDE 0.9 % IV SOLN
INTRAVENOUS | Status: DC | PRN
  Administered 2018-08-25: 30 ug/min via INTRAVENOUS

## 2018-08-25 MED ORDER — METOPROLOL SUCCINATE ER 25 MG PO TB24: 25.0000 mg | ORAL_TABLET | Freq: Every day | ORAL | Status: DC

## 2018-08-25 MED ORDER — MELOXICAM 7.5 MG PO TABS
7.5000 mg | ORAL_TABLET | Freq: Every day | ORAL | Status: DC
  Filled 2018-08-25: qty 1

## 2018-08-25 MED ORDER — CHLORHEXIDINE GLUCONATE 4 % EX LIQD: 60.0000 mL | Freq: Once | CUTANEOUS | Status: DC

## 2018-08-25 MED ORDER — BUPIVACAINE LIPOSOME 1.3 % IJ SUSP
INTRAMUSCULAR | Status: DC | PRN
  Administered 2018-08-25: 10 mL via PERINEURAL

## 2018-08-25 MED ORDER — CEFAZOLIN SODIUM-DEXTROSE 2-4 GM/100ML-% IV SOLN
2.0000 g | INTRAVENOUS | Status: AC
  Administered 2018-08-25: 2 g via INTRAVENOUS
  Filled 2018-08-25: qty 100

## 2018-08-25 MED ORDER — SUGAMMADEX SODIUM 200 MG/2ML IV SOLN
INTRAVENOUS | Status: DC | PRN
  Administered 2018-08-25: 200 mg via INTRAVENOUS

## 2018-08-25 MED ORDER — ENALAPRIL MALEATE 20 MG PO TABS
20.0000 mg | ORAL_TABLET | Freq: Every day | ORAL | Status: DC
  Filled 2018-08-25: qty 1

## 2018-08-25 MED ORDER — EPHEDRINE 5 MG/ML INJ
INTRAVENOUS | Status: AC
  Filled 2018-08-25: qty 10

## 2018-08-25 SURGICAL SUPPLY — 71 items
BIT DRILL 170X2.5X (BIT) ×1 IMPLANT
BIT DRILL 5/64X5 DISP (BIT) ×2 IMPLANT
BIT DRL 170X2.5X (BIT) ×1
BLADE SAG 18X100X1.27 (BLADE) ×2 IMPLANT
CLSR STERI-STRIP ANTIMIC 1/2X4 (GAUZE/BANDAGES/DRESSINGS) ×2 IMPLANT
COVER SURGICAL LIGHT HANDLE (MISCELLANEOUS) ×2 IMPLANT
COVER WAND RF STERILE (DRAPES) ×2 IMPLANT
CUP HUMERAL 42 PLUS 3 (Orthopedic Implant) ×2 IMPLANT
DRAPE INCISE IOBAN 66X45 STRL (DRAPES) ×2 IMPLANT
DRAPE ORTHO SPLIT 77X108 STRL (DRAPES) ×2
DRAPE SURG ORHT 6 SPLT 77X108 (DRAPES) ×2 IMPLANT
DRAPE U-SHAPE 47X51 STRL (DRAPES) ×4 IMPLANT
DRILL 2.5 (BIT) ×1
DRSG ADAPTIC 3X8 NADH LF (GAUZE/BANDAGES/DRESSINGS) ×2 IMPLANT
DRSG PAD ABDOMINAL 8X10 ST (GAUZE/BANDAGES/DRESSINGS) ×2 IMPLANT
DURAPREP 26ML APPLICATOR (WOUND CARE) ×2 IMPLANT
ELECT BLADE 4.0 EZ CLEAN MEGAD (MISCELLANEOUS) ×2
ELECT NEEDLE TIP 2.8 STRL (NEEDLE) ×2 IMPLANT
ELECT REM PT RETURN 9FT ADLT (ELECTROSURGICAL) ×2
ELECTRODE BLDE 4.0 EZ CLN MEGD (MISCELLANEOUS) ×1 IMPLANT
ELECTRODE REM PT RTRN 9FT ADLT (ELECTROSURGICAL) ×1 IMPLANT
EPIPHYSI RIGHT SZ 2 (Shoulder) ×2 IMPLANT
EPIPHYSIS RIGHT SZ 2 (Shoulder) ×1 IMPLANT
GAUZE SPONGE 4X4 12PLY STRL (GAUZE/BANDAGES/DRESSINGS) ×2 IMPLANT
GLENOSPHERE XTEND RSA 42 SD +2 (Joint) ×2 IMPLANT
GLOVE BIOGEL PI ORTHO PRO 7.5 (GLOVE) ×1
GLOVE BIOGEL PI ORTHO PRO SZ8 (GLOVE) ×1
GLOVE ORTHO TXT STRL SZ7.5 (GLOVE) ×2 IMPLANT
GLOVE PI ORTHO PRO STRL 7.5 (GLOVE) ×1 IMPLANT
GLOVE PI ORTHO PRO STRL SZ8 (GLOVE) ×1 IMPLANT
GLOVE SURG ORTHO 8.5 STRL (GLOVE) ×2 IMPLANT
GOWN STRL REUS W/ TWL LRG LVL3 (GOWN DISPOSABLE) ×1 IMPLANT
GOWN STRL REUS W/ TWL XL LVL3 (GOWN DISPOSABLE) ×2 IMPLANT
GOWN STRL REUS W/TWL LRG LVL3 (GOWN DISPOSABLE) ×1
GOWN STRL REUS W/TWL XL LVL3 (GOWN DISPOSABLE) ×2
KIT BASIN OR (CUSTOM PROCEDURE TRAY) ×2 IMPLANT
KIT TURNOVER KIT B (KITS) ×2 IMPLANT
MANIFOLD NEPTUNE II (INSTRUMENTS) ×2 IMPLANT
METAGLENE DELTA EXTEND (Trauma) ×1 IMPLANT
METAGLENE DXTEND (Trauma) ×2 IMPLANT
NEEDLE 1/2 CIR MAYO (NEEDLE) ×2 IMPLANT
NEEDLE HYPO 25GX1X1/2 BEV (NEEDLE) ×2 IMPLANT
NS IRRIG 1000ML POUR BTL (IV SOLUTION) ×2 IMPLANT
PACK SHOULDER (CUSTOM PROCEDURE TRAY) ×2 IMPLANT
PAD ABD 8X10 STRL (GAUZE/BANDAGES/DRESSINGS) ×2 IMPLANT
PAD ARMBOARD 7.5X6 YLW CONV (MISCELLANEOUS) ×4 IMPLANT
PIN GUIDE 1.2 (PIN) ×2 IMPLANT
PIN GUIDE GLENOPHERE 1.5MX300M (PIN) ×2 IMPLANT
PIN METAGLENE 2.5 (PIN) ×2 IMPLANT
RESTRAINT HEAD UNIVERSAL NS (MISCELLANEOUS) IMPLANT
SCREW 4.5X18MM (Screw) ×1 IMPLANT
SCREW 4.5X36MM (Screw) ×4 IMPLANT
SCREW BN 18X4.5XSTRL SHLDR (Screw) ×1 IMPLANT
SPONGE LAP 18X18 X RAY DECT (DISPOSABLE) IMPLANT
SPONGE LAP 4X18 RFD (DISPOSABLE) ×2 IMPLANT
STEM DELTA DIA 10 HA (Stem) ×2 IMPLANT
STRIP CLOSURE SKIN 1/2X4 (GAUZE/BANDAGES/DRESSINGS) ×2 IMPLANT
SUCTION FRAZIER HANDLE 10FR (MISCELLANEOUS) ×1
SUCTION TUBE FRAZIER 10FR DISP (MISCELLANEOUS) ×1 IMPLANT
SUT FIBERWIRE #2 38 T-5 BLUE (SUTURE) ×4
SUT MNCRL AB 4-0 PS2 18 (SUTURE) ×2 IMPLANT
SUT VIC AB 0 CT2 27 (SUTURE) ×2 IMPLANT
SUT VIC AB 2-0 CT1 27 (SUTURE) ×1
SUT VIC AB 2-0 CT1 TAPERPNT 27 (SUTURE) ×1 IMPLANT
SUT VICRYL 0 CT 1 36IN (SUTURE) ×2 IMPLANT
SUTURE FIBERWR #2 38 T-5 BLUE (SUTURE) ×2 IMPLANT
SYR CONTROL 10ML LL (SYRINGE) ×2 IMPLANT
TOWEL OR 17X24 6PK STRL BLUE (TOWEL DISPOSABLE) ×2 IMPLANT
TOWEL OR 17X26 10 PK STRL BLUE (TOWEL DISPOSABLE) ×2 IMPLANT
TOWER CARTRIDGE SMART MIX (DISPOSABLE) IMPLANT
YANKAUER SUCT BULB TIP NO VENT (SUCTIONS) ×2 IMPLANT

## 2018-08-25 NOTE — Discharge Instructions (Signed)
Ice to the shoulder constantly.  Keep the incision covered and clean and dry for one week, then ok to get it wet in the shower.  Do exercise as instructed several times per day.  DO NOT reach behind your back or push up out of a chair with the operative arm.  Use a sling while you are up and around for comfort, may remove while seated.  Keep pillow propped behind the operative elbow to keep your arm in front of you.  If you can see your elbow you are good,.   Follow up with Dr Veverly Fells in two weeks in the office, call [phone] for appt

## 2018-08-25 NOTE — Anesthesia Postprocedure Evaluation (Signed)
Anesthesia Post Note  Patient: Blake Burgess  Procedure(s) Performed: REVERSE SHOULDER ARTHROPLASTY (Right )     Patient location during evaluation: PACU Anesthesia Type: General Level of consciousness: awake and alert Pain management: pain level controlled Vital Signs Assessment: post-procedure vital signs reviewed and stable Respiratory status: spontaneous breathing, nonlabored ventilation, respiratory function stable and patient connected to nasal cannula oxygen Cardiovascular status: blood pressure returned to baseline and stable Postop Assessment: no apparent nausea or vomiting Anesthetic complications: no    Last Vitals:  Vitals:   08/25/18 1115 08/25/18 1143  BP: (!) 139/59 (!) 148/68  Pulse: 69 67  Resp: 17 18  Temp: (!) 36.3 C (!) 36.3 C  SpO2: 90% 95%    Last Pain:  Vitals:   08/25/18 1143  TempSrc: Oral  PainSc:                  [place] S

## 2018-08-25 NOTE — Anesthesia Procedure Notes (Addendum)
Anesthesia Regional Block: Interscalene brachial plexus block   Pre-Anesthetic Checklist: ,, timeout performed, Correct Patient, Correct Site, Correct Laterality, Correct Procedure, Correct Position, site marked, Risks and benefits discussed,  Surgical consent,  Pre-op evaluation,  At surgeon's request and post-op pain management  Laterality: Right  Prep: chloraprep       Needles:  Injection technique: Single-shot  Needle Type: Echogenic Stimulator Needle     Needle Length: 5cm  Needle Gauge: 22     Additional Needles:   Procedures:, nerve stimulator,,,,,,,   Nerve Stimulator or Paresthesia:  Response: biceps flexion, 0.45 mA,   Additional Responses:   Narrative:  Start time: 08/25/2018 7:10 AM End time: 08/25/2018 7:17 AM Injection made incrementally with aspirations every 5 mL.  Performed by: Personally  Anesthesiologist: Albertha Ghee, MD  Additional Notes: Functioning IV was confirmed and monitors were applied.  A 83mm 22ga Arrow echogenic stimulator needle was used. Sterile prep and drape,hand hygiene and sterile gloves were used.  Negative aspiration and negative test dose prior to incremental administration of local anesthetic. The patient tolerated the procedure well.  Ultrasound guidance: relevent anatomy identified, needle position confirmed, local anesthetic spread visualized around nerve(s), vascular puncture avoided.  Image printed for medical record.

## 2018-08-25 NOTE — Op Note (Signed)
NAME: DEONTREY, MASSI MEDICAL RECORD ZD:[id number] ACCOUNT [id number] DATE OF BIRTH:18-Nov-1931 FACILITY: [facility] LOCATION: [facility]-PER[facility] PHYSICIAN:STEVEN Orlena Sheldon, MD  OPERATIVE REPORT  DATE OF PROCEDURE:  08/25/2018  PREOPERATIVE DIAGNOSIS:  Right shoulder end-stage osteoarthritis/rotator cuff tear arthropathy.  POSTOPERATIVE DIAGNOSIS:  Right shoulder end-stage osteoarthritis/rotator cuff tear arthropathy.  PROCEDURE PERFORMED:  Right reverse total shoulder arthroplasty using DePuy Delta Xtend prosthesis.  ATTENDING SURGEON:  Esmond Plants, MD  ASSISTANT:  Gerrit Halls, PA-C, who was scrubbed during the entire procedure and necessary for satisfactory completion of surgery.  ANESTHESIA:  General anesthesia plus interscalene block was used.  ESTIMATED BLOOD LOSS:  Minimal.  FLUID REPLACEMENT:  1500 mL crystalloid.  INSTRUMENT COUNTS:  Correct.  COMPLICATIONS:  No complications.  ANTIBIOTICS:  Perioperative antibiotics were given.  INDICATIONS:  The patient is an 85 year old male with a history of worsening right shoulder pain and dysfunction secondary to rotator cuff tear arthropathy.  The patient has bone-on-bone and has had significant bone erosion on the glenoid side due to  chronic arthritis in the shoulder.  The patient has poor function and thus is a candidate for reverse shoulder replacement to restore fixed focal mechanics to the shoulder.  The patient agreed with this plan for surgery to eliminate pain and restore  function.  Informed consent obtained.  DESCRIPTION OF PROCEDURE:  After an adequate level of anesthesia was achieved, the patient was positioned in modified beach chair position.  Right shoulder correctly identified and sterilely prepped and draped in the usual manner.  Time-out called,  verifying correct patient, correct site.  We then initiated the approach to the shoulder using a deltopectoral incision starting at the coracoid process and extending down to the  anterior humerus.  Dissection down through the subcutaneous tissues.  We  identified cephalic vein and took it laterally with the deltoid pectoralis taken medially.  Conjoined tendon identified and retracted medially.  We tenodesed the biceps in situ with 0 Vicryl figure-of-eight suture, incorporating the pec tendon.  We then  released the subscapularis off the lesser tuberosity not planning repair but using that to protect the axillary nerve.  We released the inferior capsule off the neck of the humerus.  There was extensive osteophyte formation and bone-on-bone noted and a  large effusion.  We then went ahead and once we had the capsule released all the way around the back, we released the biceps and the remaining supraspinatus and infraspinatus tendon.  We extended the shoulder.  We then entered the proximal humerus with a  6 mm reamer, reamed up to a size 10.  We then used a 10 mm intramedullary resection guide to resect our head which we did with an oscillating saw at 10 degrees of retroversion.  We then removed excess osteophytes on the humeral side.  We subluxed the  humerus posteriorly and did our exposure of the glenoid.  We were able to get 360 degree exposure of the glenoid face removing large osteophytes anteriorly and inferiorly and getting back to the native glenoid.  We also did a 360 degree capsular labral  excision, careful to protect the axillary nerve inferiorly.  Once we had our exposure, we placed our central guide pin centered low inferiorly angled on the glenoid.  We then reamed for the metaglene baseplate and then did our peripheral hand reaming,  drilled our central peg hole and impacted the metaglene into position.  This is HA coated press-fit.  We placed a 36 screw inferiorly with good purchase and 36 screw  at the base of the coracoid with good purchase and an 18 nonlocked posteriorly.  We did  not have enough bone anteriorly for an additional screw.  We had great baseplate  security and fixation.  We then selected a 42+2 lateral offset glenosphere, which we went ahead and attached to the metaglene baseplate.  Once the glenosphere was in  position, I did a finger sweep to make sure there was no soft tissue incarcerated in the junction between the metaglene and the glenosphere.  Once that was confirmed and then the axillary nerve was under no significant tension and not caught up in any  portion of the repair, we extended the shoulder back, delivering the humerus out of the wound.  We finished our preparation for that preparing the metaphysis with the metaphyseal reamer for the size 2 epi or offset posteriorly.  Once we did that reaming.   We selected the 10 stem with the EPI-2 right and attached that on the 0 setting and then impacted that in 10 degrees of retroversion.  With that in position, we reduced the shoulder with a 42+3 poly and were happy with our soft tissue balancing and  tensioning.  We removed the trial components on the humeral side, irrigated thoroughly, used available bone graft from the head and did an impaction grafting technique with the HA coated press-fit stem so 10 body EPI-1 right set on the 0 setting and  placed in 10 degrees of retroversion.  We impacted that in position and had great implant security.  We then placed a 42+3 real poly and impacted that in place and reduced the shoulder.  It had nice little pop when it reduced.  No gapping with inferior  pool or external rotation and the conjoined appropriately tight and the nerve and not overly tight.  We irrigated thoroughly.  We had no significant impingement.  We resected the remaining subscapularis and did a deltopectoral closure with 0 Vicryl  suture followed by 2-0 Vicryl for subcutaneous closure and 4-0 Monocryl for skin.  Steri-Strips applied followed by sterile dressing.  The patient tolerated surgery well.  TN/NUANCE  D:08/25/2018 T:08/25/2018 JOB:[id number]

## 2018-08-25 NOTE — Transfer of Care (Signed)
Immediate Anesthesia Transfer of Care Note  Patient: Blake Burgess  Procedure(s) Performed: REVERSE SHOULDER ARTHROPLASTY (Right )  Patient Location: PACU  Anesthesia Type:GA combined with regional for post-op pain  Level of Consciousness: awake, alert , oriented and patient cooperative  Airway & Oxygen Therapy: Patient Spontanous Breathing and Patient connected to nasal cannula oxygen  Post-op Assessment: Report given to RN, Post -op Vital signs reviewed and stable and Patient moving all extremities  Post vital signs: Reviewed and stable  Last Vitals:  Vitals Value Taken Time  BP 156/95 08/25/2018  9:58 AM  Temp    Pulse 74 08/25/2018 10:00 AM  Resp 13 08/25/2018 10:00 AM  SpO2 98 % 08/25/2018 10:00 AM  Vitals shown include unvalidated device data.  Last Pain:  Vitals:   08/25/18 0604  TempSrc:   PainSc: 8       Patients Stated Pain Goal: 2 (85/92/92 4462)  Complications: No apparent anesthesia complications

## 2018-08-25 NOTE — Anesthesia Procedure Notes (Signed)
Procedure Name: Intubation Date/Time: 08/25/2018 7:35 AM Performed by: Myna Bright, CRNA Pre-anesthesia Checklist: Patient identified, Emergency Drugs available, Suction available and Patient being monitored Patient Re-evaluated:Patient Re-evaluated prior to induction Oxygen Delivery Method: Circle system utilized Preoxygenation: Pre-oxygenation with 100% oxygen Induction Type: IV induction Ventilation: Mask ventilation without difficulty Laryngoscope Size: Mac and 4 Grade View: Grade I Tube type: Oral Tube size: 7.5 mm Number of attempts: 1 Airway Equipment and Method: Stylet Placement Confirmation: ETT inserted through vocal cords under direct vision,  positive ETCO2 and breath sounds checked- equal and bilateral Secured at: 22 cm Tube secured with: Tape Dental Injury: Teeth and Oropharynx as per pre-operative assessment

## 2018-08-25 NOTE — Brief Op Note (Signed)
08/25/2018  10:04 AM  PATIENT:  Blake Burgess  85 y.o. male  PRE-OPERATIVE DIAGNOSIS:  Right shoulder end stage osteoarthritis, rotator cuff tear arthropathy  POST-OPERATIVE DIAGNOSIS:  Right shoulder end stage osteoarthritis, rotator cuff tear arthropathy  PROCEDURE:  Procedure(s): REVERSE SHOULDER ARTHROPLASTY (Right)  SURGEON:  Surgeon(s) and Role:    Netta Cedars, MD - Primary  PHYSICIAN ASSISTANT:   ASSISTANTS: Molli Barrows, PA-C   ANESTHESIA:   regional and general  EBL:  200 mL   BLOOD ADMINISTERED:none  DRAINS: none   LOCAL MEDICATIONS USED:  MARCAINE     SPECIMEN:  No Specimen  DISPOSITION OF SPECIMEN:  N/A  COUNTS:  YES  TOURNIQUET:  * No tourniquets in log *  DICTATION: .Other Dictation: Dictation Number [id number]  PLAN OF CARE: Admit to inpatient   PATIENT DISPOSITION:  PACU - hemodynamically stable.   Delay start of Pharmacological VTE agent (>24hrs) due to surgical blood loss or risk of bleeding: not applicable

## 2018-08-25 NOTE — Interval H&P Note (Signed)
History and Physical Interval Note:  08/25/2018 7:26 AM  Blake Burgess  has presented today for surgery, with the diagnosis of Right shoulder end stage osteoarthritis, rotator cuff arthropathy  The various methods of treatment have been discussed with the patient and family. After consideration of risks, benefits and other options for treatment, the patient has consented to  Procedure(s): REVERSE SHOULDER ARTHROPLASTY (Right) as a surgical intervention .  The patient's history has been reviewed, patient examined, no change in status, stable for surgery.  I have reviewed the patient's chart and labs.  Questions were answered to the patient's satisfaction.     Alexarae Oliva,STEVEN R

## 2018-08-26 LAB — BASIC METABOLIC PANEL
Anion gap: 8 (ref 5–15)
BUN: 14 mg/dL (ref 8–23)
CO2: 25 mmol/L (ref 22–32)
Calcium: 8.9 mg/dL (ref 8.9–10.3)
Chloride: 106 mmol/L (ref 98–111)
Creatinine, Ser: 1.03 mg/dL (ref 0.61–1.24)
GFR calc Af Amer: >60 mL/min (ref >60)
GFR calc non Af Amer: >60 mL/min (ref >60)
Glucose, Bld: 162 mg/dL — ABNORMAL HIGH (ref 70–99)
Potassium: 3.5 mmol/L (ref 3.5–5.1)
SODIUM: 139 mmol/L (ref 135–145)

## 2018-08-26 LAB — HEMOGLOBIN AND HEMATOCRIT, BLOOD
HCT: 32.1 % — ABNORMAL LOW (ref 39.0–52.0)
Hemoglobin: 10.1 g/dL — ABNORMAL LOW (ref 13.0–17.0)

## 2018-08-26 NOTE — Evaluation (Signed)
Occupational Therapy Evaluation Patient Details Name: Blake Burgess MRN: [id number] DOB: 1932/02/05 Today's Date: 08/26/2018    History of Present Illness 85 yo male s/p right total shoulder arthroplasty. PMH including aortic stenosis, Cancer, CAD, Diverticular disease, GERD, GI bleed, Hemorrhoids, HTN, OA, Presence of permanent cardiac pacemaker, Schatzki's ring, CABG (2008), THA (2005), and Stroke.   Clinical Impression   PTA, pt was living with his wife and was independent. Pt currently requiring Mod-Max A for UB ADLs, Min A for LB ADLs, and Min Guard A for functional mobility. Providing education and handout on shoulder precautions, exercises, UB ADLs, LB ADLs, and functional transfers. Pt with tendency to maintain right shoulder in a abducted position despite cues for resting RUE towards trunk. Will need to review ADLs, exercises, and shoulder precautions with family before dc. Recommend dc home with HHOT to optimize safety and independence with ADLs, facilitate proper adherence to shoulder precautions, and decrease caregiver burden.    Follow Up Recommendations  Home health OT;Supervision/Assistance - 24 hour    Equipment Recommendations  None recommended by OT    Recommendations for Other Services PT consult     Precautions / Restrictions Precautions Precautions: Shoulder Type of Shoulder Precautions: Active protocal. AROM hand, wrist, and elbow. shoulder FF 0-90, AB 0-60, and ER 0-30 Shoulder Interventions: Shoulder sling/immobilizer;At all times;Off for dressing/bathing/exercises Precaution Booklet Issued: Yes (comment) Precaution Comments: Reviewed all shoulder precautions, exercises, and compensatory techniques for ADLs Required Braces or Orthoses: Sling Restrictions Weight Bearing Restrictions: Yes RUE Weight Bearing: Non weight bearing      Mobility Bed Mobility               General bed mobility comments: Pt at EOB upon arrival. Pt planning to sleep in recliner  at dc  Transfers Overall transfer level: Needs assistance Equipment used: None Transfers: Sit to/from Stand Sit to Stand: Min guard         General transfer comment: Min Guard A for safety    Balance Overall balance assessment: Needs assistance Sitting-balance support: No upper extremity supported;Feet supported Sitting balance-Leahy Scale: Good Sitting balance - Comments: Able to lean forward to don pants   Standing balance support: No upper extremity supported;During functional activity Standing balance-Leahy Scale: Fair Standing balance comment: Able to maintain static standing                           ADL either performed or assessed with clinical judgement   ADL Overall ADL's : Needs assistance/impaired Eating/Feeding: Minimal assistance;Sitting Eating/Feeding Details (indicate cue type and reason): Min A to open containers Grooming: Minimal assistance;Sitting   Upper Body Bathing: Moderate assistance;Sitting Upper Body Bathing Details (indicate cue type and reason): Educating pt on UB bathing. Will need to review with family present Lower Body Bathing: Minimal assistance;Sit to/from stand Lower Body Bathing Details (indicate cue type and reason): Pt performing peri care in standing with Min Guard A after spilling urinal in bed.  Upper Body Dressing : Maximal assistance;Sitting Upper Body Dressing Details (indicate cue type and reason): Educating pt on UB dressing techniques and sling management. Will need to review with family at next session Lower Body Dressing: Minimal assistance;Sit to/from stand Lower Body Dressing Details (indicate cue type and reason): Pt donning pants and underwear with Min A to pull over socks and to bring over right hip Toilet Transfer: Min guard;Ambulation(simulated within room)         Tub/Shower Transfer Details (indicate cue type  and reason): Discussing safe shower transfer techniques Functional mobility during ADLs: Min  guard General ADL Comments: Providing education on shoulder precautions, exercises, sleep positioning, sling management, UB ADLs, LB ADLs, and functional transfers. Pt verbalized understanding. However, pt requiring increased cues to adhere to precautions. Pt with tendency to perform shoulder abduction even at rest. Educating pt on correct positioning of arm and to relax in sling     Vision         Perception     Praxis      Pertinent Vitals/Pain Pain Assessment: Faces Faces Pain Scale: Hurts little more Pain Location: Right shoulder Pain Descriptors / Indicators: Constant;Discomfort;Grimacing Pain Intervention(s): Monitored during session;Limited activity within patient's tolerance;Repositioned     Hand Dominance Right   Extremity/Trunk Assessment Upper Extremity Assessment Upper Extremity Assessment: RUE deficits/detail RUE Deficits / Details: s/p total shoulder arthroplasty RUE: Unable to fully assess due to immobilization RUE Coordination: decreased gross motor   Lower Extremity Assessment Lower Extremity Assessment: Defer to PT evaluation       Communication Communication Communication: HOH   Cognition Arousal/Alertness: Awake/alert Behavior During Therapy: WFL for tasks assessed/performed Overall Cognitive Status: Within Functional Limits for tasks assessed                                 General Comments: Requiring increased cues to adherance to precaucations.    General Comments  Pt accidently having spilt urinal while using it in bed prior to session. Assisting pt in performing LB bathing    Exercises Exercises: Shoulder Shoulder Exercises Shoulder Flexion: PROM;Self ROM;Right;10 reps;Seated(Pt performing 0-30 within precautions of 0-90) Shoulder Extension: PROM;Right;10 reps;Seated(0-90) Shoulder ABduction: Right;10 reps;Seated;PROM;Self ROM(0-60) Shoulder External Rotation: PROM;Self ROM;10 reps;Seated(0-30) Elbow Flexion: AROM;Right;10  reps;Seated Elbow Extension: AROM;Right;10 reps;Seated Wrist Flexion: AROM;Right;10 reps;Seated Wrist Extension: AROM;Right;10 reps;Seated Digit Composite Flexion: AROM;Right;10 reps;Seated Composite Extension: AROM;Right;10 reps;Seated   Shoulder Instructions Shoulder Instructions Donning/doffing shirt without moving shoulder: Maximal assistance;Caregiver independent with task Method for sponge bathing under operated UE: Moderate assistance;Caregiver independent with task Donning/doffing sling/immobilizer: Maximal assistance;Caregiver independent with task Correct positioning of sling/immobilizer: Maximal assistance ROM for elbow, wrist and digits of operated UE: Minimal assistance Sling wearing schedule (on at all times/off for ADL's): Supervision/safety Proper positioning of operated UE when showering: Supervision/safety Positioning of UE while sleeping: Supervision/safety;Minimal assistance    Home Living Family/patient expects to be discharged to:: Private residence Living Arrangements: Spouse/significant other Available Help at Discharge: Family;Available 24 hours/day Type of Home: House Home Access: Stairs to enter [employer] of Steps: 1 (three at front door)   Home Layout: One level     Bathroom Shower/Tub: Occupational psychologist: Standard     Home Equipment: Cane - single point;Shower seat          Prior Functioning/Environment Level of Independence: Independent                 OT Problem List: Decreased strength;Decreased range of motion;Decreased activity tolerance;Impaired balance (sitting and/or standing);Decreased knowledge of use of DME or AE;Decreased knowledge of precautions;Impaired UE functional use;Pain      OT Treatment/Interventions: Self-care/ADL training;Therapeutic exercise;Energy conservation;DME and/or AE instruction;Therapeutic activities;Patient/family education    OT Goals(Current goals can be found in the care  plan section) Acute Rehab OT Goals Patient Stated Goal: "Make sure I do this shoulder right" OT Goal Formulation: With patient Time For Goal Achievement: 09/09/18 Potential to Achieve Goals: Good  OT Frequency:  Min 2X/week   Barriers to D/C:            Co-evaluation              AM-PAC OT "6 Clicks" Daily Activity     Outcome Measure Help from another person eating meals?: A Little Help from another person taking care of personal grooming?: A Little Help from another person toileting, which includes using toliet, bedpan, or urinal?: A Little Help from another person bathing (including washing, rinsing, drying)?: A Lot Help from another person to put on and taking off regular upper body clothing?: A Little Help from another person to put on and taking off regular lower body clothing?: A Lot 6 Click Score: 16   End of Session Equipment Utilized During Treatment: Gait belt;Other (comment)(sling) Nurse Communication: Mobility status;Precautions;Weight bearing status  Activity Tolerance: Patient tolerated treatment well Patient left: in chair;with call bell/phone within reach  OT Visit Diagnosis: Unsteadiness on feet (R26.81);Other abnormalities of gait and mobility (R26.89);Muscle weakness (generalized) (M62.81);Pain Pain - Right/Left: Right Pain - part of body: Shoulder                Time: 0102-7253 OT Time Calculation (min): 40 min Charges:  OT General Charges $OT Visit: 1 Visit OT Evaluation $OT Eval Moderate Complexity: 1 Mod OT Treatments $Self Care/Home Management : 23-37 mins  Mariapaula Krist MSOT, OTR/L Acute Rehab Pager: [phone] Office: [place] 08/26/2018, 10:40 AM

## 2018-08-26 NOTE — Progress Notes (Signed)
   Subjective:  Patient reports pain as mild.  He did have some breakthrough pain overnight when his block wore off.  This morning he states he has called up on pain control and is quite comfortable.  No overnight events.  He denies chest pain, shortness of breath, nausea or vomiting.  Objective:   VITALS:   Vitals:   08/25/18 2022 08/25/18 2332 08/26/18 0325 08/26/18 0710  BP: 138/61 (!) 160/67 (!) 164/82 (!) 156/75  Pulse: 75 71 81 86  Resp: 20 18 20 16   Temp: 97.8 F (36.6 C) 97.6 F (36.4 C) (!) 97.5 F (36.4 C) (!) 97.5 F (36.4 C)  TempSrc: Oral Oral Oral Oral  SpO2: 96% 97% 95% 97%  Weight:      Height:        Neurologically intact Neurovascular intact Sensation intact distally Intact pulses distally Incision: dressing C/D/I Compartment soft Sling in place   Lab Results  Component Value Date   WBC 7.0 08/25/2018   HGB 10.1 (L) 08/26/2018   HCT 32.1 (L) 08/26/2018   MCV 101.2 (H) 08/25/2018   PLT 280 08/25/2018   BMET    Component Value Date/Time   NA 139 08/26/2018 0726   K 3.5 08/26/2018 0726   CL 106 08/26/2018 0726   CO2 25 08/26/2018 0726   GLUCOSE 162 (H) 08/26/2018 0726   BUN 14 08/26/2018 0726   CREATININE 1.03 08/26/2018 0726   CALCIUM 8.9 08/26/2018 0726   GFRNONAA >60 08/26/2018 0726   GFRAA >60 08/26/2018 0726     Assessment/Plan: 1 Day Post-Op   Active Problems:   S/P shoulder replacement, right   Up with therapy -Nonweightbearing to right upper extremity. -Postoperative care per Dr. Gilberto Better protocol. -Dressing changed to Aquacel prior to discharge.  He will follow-up in 2 weeks with Dr. Veverly Fells.   Nicholes Stairs 08/26/2018, 8:56 AM   Geralynn Rile, MD [phone]

## 2018-08-26 NOTE — Progress Notes (Signed)
Patient is discharged from room 3C10 at this time. Alert and in stable condition. IV site d/c'd and instructions read to patient and son with understanding verbalized. Left unit via wheelchair with all belongings at side.

## 2018-08-26 NOTE — Care Management Note (Signed)
Case Management Note  Patient Details  Name: Blake Burgess MRN: [id number] Date of Birth: 1932-04-08  Subjective/Objective:        Pt to return home with wife after shoulder replacement.  Son at bedside to receive d/c instructions and present during therapy session.              Action/Plan: Attempted to speak to patient and son when on unit, but unable to due to therapy session in progress.  When returned, patient discharged.  Called patient's son after d/c.  Son states his father has had HHPT through [facility] in the past and would like to use [facility] again.  Referral called to central intake at [facility] and information faxed to [phone].  Expected Discharge Date:  08/26/18               Expected Discharge Plan:  [place]  In-House Referral:  NA  Discharge planning Services  CM Consult  Post Acute Care Choice:  Home Health Choice offered to:  Patient, Adult Children  DME Arranged:  N/A DME Agency:  NA  HH Arranged:  PT, OT HH Agency:  [facility]  Status of Service:  Completed, signed off  If discussed at [place] of Stay Meetings, dates discussed:    Additional Comments:  Claudie Leach, RN 08/26/2018, 11:07 AM

## 2018-08-26 NOTE — Evaluation (Signed)
Physical Therapy Evaluation Patient Details Name: Blake Burgess MRN: [id number] DOB: December 03, 1931 Today's Date: 08/26/2018   History of Present Illness  85 yo male s/p right total shoulder arthroplasty. PMH including aortic stenosis, Cancer, CAD, Diverticular disease, GERD, GI bleed, Hemorrhoids, HTN, OA, Presence of permanent cardiac pacemaker, Schatzki's ring, CABG (2008), THA (2005), and Stroke.  Clinical Impression  Patient evaluated by Physical Therapy with no further acute PT needs identified. Patient ambulating 400 feet with cane and supervision. Mild balance deficits noted and recommended use of cane for all mobility. Educated on fall prevention techniques and home safety/set up.All education has been completed and the patient has no further questions. See below for any follow-up Physical Therapy or equipment needs. PT is signing off. Thank you for this referral.     Follow Up Recommendations Home health PT    Equipment Recommendations  None recommended by PT    Recommendations for Other Services       Precautions / Restrictions Precautions Precautions: Shoulder Type of Shoulder Precautions: Active protocal. AROM hand, wrist, and elbow. shoulder FF 0-90, AB 0-60, and ER 0-30 Shoulder Interventions: Shoulder sling/immobilizer;At all times;Off for dressing/bathing/exercises Precaution Booklet Issued: Yes (comment) Precaution Comments: Reviewed all shoulder precautions, exercises, and compensatory techniques for ADLs Required Braces or Orthoses: Sling Restrictions Weight Bearing Restrictions: Yes RUE Weight Bearing: Non weight bearing      Mobility  Bed Mobility               General bed mobility comments: Pt in chair on arrival  Transfers Overall transfer level: Needs assistance Equipment used: None Transfers: Sit to/from Stand Sit to Stand: Min guard         General transfer comment: Min Guard A for safety  Ambulation/Gait Ambulation/Gait assistance:  Scientist, forensic (Feet): 400 Feet Assistive device: Straight cane Gait Pattern/deviations: Step-through pattern     General Gait Details: Cues for increased left foot clearance  Stairs            Wheelchair Mobility    Modified Rankin (Stroke Patients Only)       Balance Overall balance assessment: Needs assistance Sitting-balance support: No upper extremity supported;Feet supported Sitting balance-Leahy Scale: Good Sitting balance - Comments: Able to lean forward to don pants   Standing balance support: No upper extremity supported;During functional activity Standing balance-Leahy Scale: Fair Standing balance comment: Able to maintain static standing                             Pertinent Vitals/Pain Pain Assessment: Faces Faces Pain Scale: Hurts little more Pain Location: Right shoulder Pain Descriptors / Indicators: Constant;Discomfort;Grimacing Pain Intervention(s): Monitored during session;Limited activity within patient's tolerance;Repositioned    Home Living Family/patient expects to be discharged to:: Private residence Living Arrangements: Spouse/significant other Available Help at Discharge: Family;Available 24 hours/day Type of Home: House Home Access: Stairs to enter   [employer] of Steps: 1 (three at front door) Home Layout: One level Home Equipment: Cane - single point;Shower seat      Prior Function Level of Independence: Independent         Comments: History of falls     Hand Dominance   Dominant Hand: Right    Extremity/Trunk Assessment   Upper Extremity Assessment Upper Extremity Assessment: Defer to OT evaluation RUE Deficits / Details: s/p total shoulder arthroplasty RUE: Unable to fully assess due to immobilization RUE Coordination: decreased gross motor    Lower Extremity  Assessment Lower Extremity Assessment: Overall WFL for tasks assessed       Communication   Communication: HOH   Cognition Arousal/Alertness: Awake/alert Behavior During Therapy: WFL for tasks assessed/performed Overall Cognitive Status: Within Functional Limits for tasks assessed                                 General Comments: Requiring increased cues to adherance to precaucations.       General Comments General comments (skin integrity, edema, etc.): Pt accidently having spilt urinal while using it in bed prior to session. Assisting pt in performing LB bathing    Exercises    Assessment/Plan    PT Assessment Patent does not need any further PT services  PT Problem List         PT Treatment Interventions      PT Goals (Current goals can be found in the Care Plan section)  Acute Rehab PT Goals Patient Stated Goal: "Make sure I do this shoulder right" PT Goal Formulation: All assessment and education complete, DC therapy    Frequency     Barriers to discharge        Co-evaluation               AM-PAC PT "6 Clicks" Mobility  Outcome Measure Help needed turning from your back to your side while in a flat bed without using bedrails?: None Help needed moving from lying on your back to sitting on the side of a flat bed without using bedrails?: None Help needed moving to and from a bed to a chair (including a wheelchair)?: None Help needed standing up from a chair using your arms (e.g., wheelchair or bedside chair)?: None Help needed to walk in hospital room?: None Help needed climbing 3-5 steps with a railing? : A Little 6 Click Score: 23    End of Session Equipment Utilized During Treatment: Other (comment)(sling) Activity Tolerance: Patient tolerated treatment well Patient left: in chair;with call bell/phone within reach Nurse Communication: Mobility status PT Visit Diagnosis: Unsteadiness on feet (R26.81);History of falling (Z91.81)    Time: 2620-3559 PT Time Calculation (min) (ACUTE ONLY): 12 min   Charges:   PT Evaluation $PT Eval Low Complexity:  1 Low         Ellamae Sia, PT, DPT Acute Rehabilitation Services Pager [phone] Office [phone]  Willy Eddy 08/26/2018, 12:05 PM

## 2018-08-26 NOTE — Progress Notes (Addendum)
Occupational Therapy Treatment Patient Details Name: Blake Burgess MRN: [id number] DOB: Mar 15, 1932 Today's Date: 08/26/2018    History of present illness 85 yo male s/p right total shoulder arthroplasty. PMH including aortic stenosis, Cancer, CAD, Diverticular disease, GERD, GI bleed, Hemorrhoids, HTN, OA, Presence of permanent cardiac pacemaker, Schatzki's ring, CABG (2008), THA (2005), and Stroke.   OT comments  Returned for second visit to review education and precautions with pt's son to increase carry over and safety to home. Reviewed handout and education with pt and son on shoulder precautions, exercises, sleep positioning, UB ADLs, sling management, and functional transfers. Pt's son demonstrated and verbalized understanding. Continue to recommend dc home with HHOT and will continue to follow acutely as admitted.    Follow Up Recommendations  Home health OT;Supervision/Assistance - 24 hour    Equipment Recommendations  None recommended by OT    Recommendations for Other Services PT consult    Precautions / Restrictions Precautions Precautions: Shoulder Type of Shoulder Precautions: Active protocal. AROM hand, wrist, and elbow. shoulder FF 0-90, AB 0-60, and ER 0-30 Shoulder Interventions: Shoulder sling/immobilizer;At all times;Off for dressing/bathing/exercises Precaution Booklet Issued: Yes (comment) Precaution Comments: Reviewed all shoulder precautions, exercises, and compensatory techniques for ADLs Required Braces or Orthoses: Sling Restrictions Weight Bearing Restrictions: Yes RUE Weight Bearing: Non weight bearing       Mobility Bed Mobility               General bed mobility comments: Pt at EOB upon arrival. Pt planning to sleep in recliner at dc  Transfers Overall transfer level: Needs assistance Equipment used: None Transfers: Sit to/from Stand Sit to Stand: Min guard         General transfer comment: Min Guard A for safety    Balance Overall  balance assessment: Needs assistance Sitting-balance support: No upper extremity supported;Feet supported Sitting balance-Leahy Scale: Good Sitting balance - Comments: Able to lean forward to don pants   Standing balance support: No upper extremity supported;During functional activity Standing balance-Leahy Scale: Fair Standing balance comment: Able to maintain static standing                           ADL either performed or assessed with clinical judgement   ADL Overall ADL's : Needs assistance/impaired Eating/Feeding: Minimal assistance;Sitting Eating/Feeding Details (indicate cue type and reason): Min A to open containers Grooming: Minimal assistance;Sitting   Upper Body Bathing: Moderate assistance;Sitting Upper Body Bathing Details (indicate cue type and reason): Providing pt and son with education on UB bathing techniques. Both verbalized understanding Lower Body Bathing: Minimal assistance;Sit to/from stand Lower Body Bathing Details (indicate cue type and reason): Pt performing peri care in standing with Min Guard A after spilling urinal in bed.  Upper Body Dressing : Maximal assistance;Sitting;With caregiver independent assisting Upper Body Dressing Details (indicate cue type and reason): Providing son with education on comepnsatory techniques for donning/doffing shirt as well as sling management and positioning. Pt's son demosntrating understanding. Pt requiring cues to decrease movement at right shoulder Lower Body Dressing: Minimal assistance;Sit to/from stand Lower Body Dressing Details (indicate cue type and reason): Son donning pt's shoes for him Toilet Transfer: Min guard;Ambulation(simulated within room)         Tub/Shower Transfer Details (indicate cue type and reason): Discussing safe shower transfer techniques Functional mobility during ADLs: Min guard General ADL Comments: Reviewed all education on compensatory techniques for ADLs and exercises with pt  and son. Pt's son  verbalized and demosntrated understanding.      Vision       Perception     Praxis      Cognition Arousal/Alertness: Awake/alert Behavior During Therapy: WFL for tasks assessed/performed Overall Cognitive Status: Within Functional Limits for tasks assessed                                 General Comments: Requiring increased cues to adherance to precaucations.         Exercises Reviewed all exercises with pt's son   Shoulder Instructions Shoulder Instructions Donning/doffing shirt without moving shoulder: Maximal assistance;Caregiver independent with task Method for sponge bathing under operated UE: Moderate assistance;Caregiver independent with task Donning/doffing sling/immobilizer: Maximal assistance;Caregiver independent with task Correct positioning of sling/immobilizer: Maximal assistance;Caregiver independent with task ROM for elbow, wrist and digits of operated UE: Minimal assistance Sling wearing schedule (on at all times/off for ADL's): Supervision/safety Proper positioning of operated UE when showering: Supervision/safety Positioning of UE while sleeping: Minimal assistance;Caregiver independent with task     General Comments Pt's son present throughout session    Pertinent Vitals/ Pain       Pain Assessment: Faces Faces Pain Scale: Hurts little more Pain Location: Right shoulder Pain Descriptors / Indicators: Constant;Discomfort;Grimacing Pain Intervention(s): Monitored during session;Limited activity within patient's tolerance;Repositioned  Home Living Family/patient expects to be discharged to:: Private residence Living Arrangements: Spouse/significant other Available Help at Discharge: Family;Available 24 hours/day Type of Home: House Home Access: Stairs to enter [employer] of Steps: 1 (three at front door)   Home Layout: One level     Bathroom Shower/Tub: Occupational psychologist: Standard      Home Equipment: Cane - single point;Shower seat          Prior Functioning/Environment Level of Independence: Independent        Comments: History of falls   Frequency  Min 2X/week        Progress Toward Goals  OT Goals(current goals can now be found in the care plan section)  Progress towards OT goals: Progressing toward goals  Acute Rehab OT Goals Patient Stated Goal: "Make sure I do this shoulder right" OT Goal Formulation: With patient Time For Goal Achievement: 09/09/18 Potential to Achieve Goals: Good ADL Goals Pt Will Perform Upper Body Bathing: with min assist;with caregiver independent in assisting;sitting Pt Will Perform Upper Body Dressing: with min assist;sitting;with caregiver independent in assisting Pt/caregiver will Perform Home Exercise Program: Increased strength;Increased ROM;Right Upper extremity;With Supervision;With written HEP provided  Plan Discharge plan remains appropriate    Co-evaluation                 AM-PAC OT "6 Clicks" Daily Activity     Outcome Measure   Help from another person eating meals?: A Little Help from another person taking care of personal grooming?: A Little Help from another person toileting, which includes using toliet, bedpan, or urinal?: A Little Help from another person bathing (including washing, rinsing, drying)?: A Lot Help from another person to put on and taking off regular upper body clothing?: A Little Help from another person to put on and taking off regular lower body clothing?: A Lot 6 Click Score: 16    End of Session Equipment Utilized During Treatment: Gait belt;Other (comment)(sling)  OT Visit Diagnosis: Unsteadiness on feet (R26.81);Other abnormalities of gait and mobility (R26.89);Muscle weakness (generalized) (M62.81);Pain Pain - Right/Left: Right Pain - part of  body: Shoulder   Activity Tolerance Patient tolerated treatment well   Patient Left in chair;with call bell/phone within  reach   Nurse Communication Mobility status;Precautions;Weight bearing status        Time: 4730-8569 OT Time Calculation (min): 22 min  Charges: OT General Charges $OT Visit: 1 Visit OT Treatments $Self Care/Home Management : 8-22 mins  [place], OTR/L Acute Rehab Pager: [phone] Office: [place] 08/26/2018, 1:22 PM

## 2018-08-28 ENCOUNTER — Encounter (HOSPITAL_COMMUNITY): Payer: Self-pay | Admitting: Orthopedic Surgery

## 2018-08-29 NOTE — Discharge Summary (Signed)
Orthopedic Discharge Summary        Physician Discharge Summary  Patient ID: Blake Burgess MRN: [id number] DOB/AGE: 1932/01/21 85 y.o.  Admit date: 08/25/2018 Discharge date: 08/26/18   Procedures:  Procedure(s) (LRB): REVERSE SHOULDER ARTHROPLASTY (Right)  Attending Physician:  Dr. Esmond Plants  Admission Diagnoses:   Right shoulder cuff arthropathy  Discharge Diagnoses:  Right shoulder cuff arthropathy   Past Medical History:  Diagnosis Date  . Aortic stenosis    mild AS 09/2017 echo  . Cancer ([place])    skin  . Coronary artery disease    a.  s/p CABG;   b. cath 4/12: EF 55%, 3vCAD, patent L-LAD, patent S-RCA, patent S-CFX (done after a false pos. ETT)  . Diverticular disease   . GERD (gastroesophageal reflux disease)   . GI bleed   . Hemorrhoids   . HH (hiatus hernia)   . History of kidney stones   . Hypertension   . Osteoarthritis   . Other and unspecified hyperlipidemia   . Presence of permanent cardiac pacemaker   . Schatzki's ring   . Stroke [employer] [facility] [employer])     PCP: Lacie Draft, NP   Discharged Condition: good  Hospital Course:  Patient underwent the above stated procedure on 08/25/2018. Patient tolerated the procedure well and brought to the recovery room in good condition and subsequently to the floor. Patient had an uncomplicated hospital course and was stable for discharge.   Disposition:  with follow up in 2 weeks   Follow-up Information    Netta Cedars, MD. Call in 2 weeks.   Specialty:  Orthopedic Surgery Why:  [phone] Contact information: [address] [place] [address] [phone]        [facility] Follow up.   Specialty:  Home Health Services Contact information: [address] [place] [address] [phone]           Discharge Instructions    Call MD / Call 911   Complete by:  As directed    If you experience chest pain or shortness of breath, CALL 911 and be transported to the  hospital emergency room.  If you develope a fever above 101 F, pus (white drainage) or increased drainage or redness at the wound, or calf pain, call your surgeon's office.   Constipation Prevention   Complete by:  As directed    Drink plenty of fluids.  Prune juice may be helpful.  You may use a stool softener, such as Colace (over the counter) 100 mg twice a day.  Use MiraLax (over the counter) for constipation as needed.   Diet - low sodium heart healthy   Complete by:  As directed    Face-to-face encounter (required for Medicare/Medicaid patients)   Complete by:  As directed    I Nicholes Stairs certify that this patient is under my care and that I, or a nurse practitioner or physician's assistant working with me, had a face-to-face encounter that meets the physician face-to-face encounter requirements with this patient on 08/26/2018. The encounter with the patient was in whole, or in part for the following medical condition(s) which is the primary reason for home health care (List medical condition):  S/p right shoulder replacement and poor mobility   The encounter with the patient was in whole, or in part, for the following medical condition, which is the primary reason for home health care:  s/p shoulder replacement   I certify that, based on my  findings, the following services are medically necessary home health services:  Physical therapy   Reason for Medically Necessary Home Health Services:  Therapy- Home Adaptation to Facilitate Safety   My clinical findings support the need for the above services:  Unable to leave home safely without assistance and/or assistive device   Further, I certify that my clinical findings support that this patient is homebound due to:  Unable to leave home safely without assistance   Home Health   Complete by:  As directed    To provide the following care/treatments:   OT PT     Increase activity slowly as tolerated   Complete by:  As directed        Allergies as of 08/26/2018   No Known Allergies     Medication List    TAKE these medications   acetaminophen 500 MG tablet Commonly known as:  TYLENOL Take 1,000 mg by mouth 3 (three) times daily.   amLODipine 5 MG tablet Commonly known as:  NORVASC Take 1 tablet (5 mg total) by mouth daily.   aspirin 81 MG tablet Take 1 tablet (81 mg total) by mouth daily.   atorvastatin 10 MG tablet Commonly known as:  LIPITOR Take 10 mg by mouth daily.   Cyanocobalamin 1000 MCG/ML Kit Inject 1,000 mcg as directed every 30 (thirty) days.   enalapril 20 MG tablet Commonly known as:  VASOTEC Take 20 mg by mouth daily.   meloxicam 7.5 MG tablet Commonly known as:  MOBIC Take 7.5 mg by mouth daily.   metoprolol succinate 25 MG 24 hr tablet Commonly known as:  TOPROL-XL Take 25 mg by mouth daily.   omeprazole 20 MG capsule Commonly known as:  PRILOSEC Take 20 mg by mouth daily.   tamsulosin 0.4 MG Caps capsule Commonly known as:  FLOMAX Take 0.4 mg by mouth daily.   traMADol 50 MG tablet Commonly known as:  ULTRAM Take 1 tablet (50 mg total) by mouth every 6 (six) hours as needed for moderate pain or severe pain.   triamcinolone cream 0.5 % Commonly known as:  KENALOG Apply 1 application topically daily as needed (skin sores).         Signed: Ventura Bruns 08/29/2018, 8:35 AM  [facility] is now [facility] [address] [place], [place], [place] [address] Phone: [place]

## 2018-09-08 LAB — CUP PACEART REMOTE DEVICE CHECK
Brady Statistic AP VP Percent: 2.64 %
Brady Statistic AP VS Percent: 0 %
Brady Statistic AS VP Percent: 91.89 %
Brady Statistic AS VS Percent: 5.47 %
Brady Statistic RA Percent Paced: 3.16 %
Brady Statistic RV Percent Paced: 94.52 %
Date Time Interrogation Session: 20191121182517
Implantable Lead Implant Date: 20190211
Implantable Lead Location: 753859
Implantable Lead Location: 753860
Implantable Lead Model: 3830
Implantable Lead Model: 5076
Lead Channel Impedance Value: 247 Ohm
Lead Channel Impedance Value: 285 Ohm
Lead Channel Impedance Value: 418 Ohm
Lead Channel Impedance Value: 551 Ohm
Lead Channel Pacing Threshold Amplitude: 2.5 V
Lead Channel Pacing Threshold Pulse Width: 0.4 ms
Lead Channel Pacing Threshold Pulse Width: 0.4 ms
Lead Channel Sensing Intrinsic Amplitude: 2.25 mV
Lead Channel Sensing Intrinsic Amplitude: 2.25 mV
Lead Channel Sensing Intrinsic Amplitude: 3.625 mV
Lead Channel Sensing Intrinsic Amplitude: 3.625 mV
Lead Channel Setting Pacing Amplitude: 1.75 V
Lead Channel Setting Pacing Amplitude: 2.5 V
Lead Channel Setting Pacing Pulse Width: 1 ms
Lead Channel Setting Sensing Sensitivity: 0.6 mV
MDC IDC LEAD IMPLANT DT: 20190211
MDC IDC MSMT BATTERY REMAINING LONGEVITY: 92 mo
MDC IDC MSMT BATTERY VOLTAGE: 3.01 V
MDC IDC MSMT LEADCHNL RA PACING THRESHOLD AMPLITUDE: 0.875 V
MDC IDC PG IMPLANT DT: 20190211

## 2018-10-12 ENCOUNTER — Ambulatory Visit (INDEPENDENT_AMBULATORY_CARE_PROVIDER_SITE_OTHER): Payer: Medicare Other

## 2018-10-12 DIAGNOSIS — I5032 Chronic diastolic (congestive) heart failure: Secondary | ICD-10-CM

## 2018-10-12 DIAGNOSIS — I442 Atrioventricular block, complete: Secondary | ICD-10-CM

## 2018-10-13 ENCOUNTER — Encounter: Payer: Self-pay | Admitting: Internal Medicine

## 2018-10-13 ENCOUNTER — Ambulatory Visit (INDEPENDENT_AMBULATORY_CARE_PROVIDER_SITE_OTHER): Payer: Medicare Other | Admitting: Internal Medicine

## 2018-10-13 VITALS — BP 138/94 | HR 81 | Ht 69.0 in | Wt 177.0 lb

## 2018-10-13 DIAGNOSIS — Z950 Presence of cardiac pacemaker: Secondary | ICD-10-CM

## 2018-10-13 DIAGNOSIS — M549 Dorsalgia, unspecified: Secondary | ICD-10-CM | POA: Insufficient documentation

## 2018-10-13 DIAGNOSIS — K5790 Diverticulosis of intestine, part unspecified, without perforation or abscess without bleeding: Secondary | ICD-10-CM | POA: Insufficient documentation

## 2018-10-13 DIAGNOSIS — R0600 Dyspnea, unspecified: Secondary | ICD-10-CM | POA: Insufficient documentation

## 2018-10-13 DIAGNOSIS — I2510 Atherosclerotic heart disease of native coronary artery without angina pectoris: Secondary | ICD-10-CM

## 2018-10-13 DIAGNOSIS — L578 Other skin changes due to chronic exposure to nonionizing radiation: Secondary | ICD-10-CM | POA: Insufficient documentation

## 2018-10-13 DIAGNOSIS — R001 Bradycardia, unspecified: Secondary | ICD-10-CM | POA: Diagnosis not present

## 2018-10-13 DIAGNOSIS — M4800 Spinal stenosis, site unspecified: Secondary | ICD-10-CM | POA: Insufficient documentation

## 2018-10-13 DIAGNOSIS — C801 Malignant (primary) neoplasm, unspecified: Secondary | ICD-10-CM | POA: Insufficient documentation

## 2018-10-13 DIAGNOSIS — I442 Atrioventricular block, complete: Secondary | ICD-10-CM | POA: Insufficient documentation

## 2018-10-13 DIAGNOSIS — H9190 Unspecified hearing loss, unspecified ear: Secondary | ICD-10-CM | POA: Insufficient documentation

## 2018-10-13 DIAGNOSIS — I10 Essential (primary) hypertension: Secondary | ICD-10-CM

## 2018-10-13 NOTE — Progress Notes (Signed)
HPI Blake Burgess returns today for followup of CHB, s/p PPM insertion. He is a pleasant elderly man with CAD, HTN, who presented with symptomatic CHB back in February and underwent insertion of a Medtronic DDD PM with a His bundle lead placed. He has been stable from a cardiac perspective. He denies chest pain or sob. He admits to being very sedentary. He has undergone shoulder replacement.  No Known Allergies   Current Outpatient Medications  Medication Sig Dispense Refill  . acetaminophen (TYLENOL) 500 MG tablet Take 1,000 mg by mouth 3 (three) times daily.     Marland Kitchen amLODipine (NORVASC) 5 MG tablet Take 1 tablet (5 mg total) by mouth daily. 90 tablet 2  . aspirin 81 MG tablet Take 1 tablet (81 mg total) by mouth daily. 30 tablet   . atorvastatin (LIPITOR) 10 MG tablet Take 10 mg by mouth daily.     . Cyanocobalamin 1000 MCG/ML KIT Inject 1,000 mcg as directed every 30 (thirty) days.    . enalapril (VASOTEC) 20 MG tablet Take 20 mg by mouth daily.     . meloxicam (MOBIC) 7.5 MG tablet Take 7.5 mg by mouth daily.     . metoprolol succinate (TOPROL-XL) 25 MG 24 hr tablet Take 25 mg by mouth daily.    Marland Kitchen omeprazole (PRILOSEC) 20 MG capsule Take 20 mg by mouth daily.     . tamsulosin (FLOMAX) 0.4 MG CAPS capsule Take 0.4 mg by mouth daily.    Marland Kitchen triamcinolone cream (KENALOG) 0.5 % Apply 1 application topically daily as needed (skin sores).     No current facility-administered medications for this visit.      Past Medical History:  Diagnosis Date  . Aortic stenosis    mild AS 09/2017 echo  . Cancer ([place])    skin  . Coronary artery disease    a.  s/p CABG;   b. cath 4/12: EF 55%, 3vCAD, patent L-LAD, patent S-RCA, patent S-CFX (done after a false pos. ETT)  . Diverticular disease   . GERD (gastroesophageal reflux disease)   . GI bleed   . Hemorrhoids   . HH (hiatus hernia)   . History of kidney stones   . Hypertension   . Osteoarthritis   . Other and unspecified hyperlipidemia   .  Presence of permanent cardiac pacemaker   . Schatzki's ring   . Stroke ([place])     ROS:   All systems reviewed and negative except as noted in the HPI.   Past Surgical History:  Procedure Laterality Date  . ARTERIOVENOUS GRAFT PLACEMENT W/ ENDOSCOPIC VEIN HARVEST     of the right leg greater spahenous vein. Surgeon: Tharon Aquas Trigt,M.D.  . COLONOSCOPY  02/24/2010   Hemorrhoids, Diverticulosis. Performed at [facility]. Normal terminal ileum. Dr. June Leap, [place] GRAFT  06/21/2007   CABG x 3 Surgeon Ivin Poot, MD  . EYE SURGERY     bilateral cataract removal  . hip replace  06/09/2004   left hip Surgeon Pietro Cassis. Alvan Dame, [place] IMPLANT N/A 10/03/2017   Procedure: PACEMAKER IMPLANT;  Surgeon: Evans Lance, MD;  Location: [facility];  Service: Cardiovascular;  Laterality: N/A;  . REVERSE SHOULDER ARTHROPLASTY Right 08/25/2018   Procedure: REVERSE SHOULDER ARTHROPLASTY;  Surgeon: Netta Cedars, MD;  Location: [facility];  Service: Orthopedics;  Laterality: Right;     Family History  Problem Relation Age of Onset  . Heart attack  Mother   . Hypertension Mother   . Diabetes Father   . Diabetes Brother   . Diabetes Sister      Social History   Socioeconomic History  . Marital status: Married    Spouse name: Not on file  . Number of children: 2  . Years of education: Not on file  . Highest education level: Not on file  Occupational History    Employer: RETIRED  Social Needs  . Financial resource strain: Not on file  . Food insecurity:    Worry: Not on file    Inability: Not on file  . Transportation needs:    Medical: Not on file    Non-medical: Not on file  Tobacco Use  . Smoking status: Never Smoker  . Smokeless tobacco: Never Used  Substance and Sexual Activity  . Alcohol use: No  . Drug use: No  . Sexual activity: Not on file  Lifestyle  . Physical activity:    Days per week: Not on file    Minutes per session: Not on  file  . Stress: Not on file  Relationships  . Social connections:    Talks on phone: Not on file    Gets together: Not on file    Attends religious service: Not on file    Active member of club or organization: Not on file    Attends meetings of clubs or organizations: Not on file    Relationship status: Not on file  . Intimate partner violence:    Fear of current or ex partner: Not on file    Emotionally abused: Not on file    Physically abused: Not on file    Forced sexual activity: Not on file  Other Topics Concern  . Not on file  Social History Narrative   No Regular exercise. Daily Caffeine: 24 oz pepsi and 1 cup coffee.      BP (!) 138/94   Pulse 81   Ht _0  (1.753 m)   Wt 177 lb (80.3 kg)   SpO2 98%   BMI 26.14 kg/m   Physical Exam:  Well appearing NAD HEENT: Unremarkable Neck:  No JVD, no thyromegally Lymphatics:  No adenopathy Back:  No CVA tenderness Lungs:  Clear HEART:  Regular rate rhythm, no murmurs, no rubs, no clicks Abd:  soft, positive bowel sounds, no organomegally, no rebound, no guarding Ext:  2 plus pulses, no edema, no cyanosis, no clubbing Skin:  No rashes no nodules Neuro:  CN II through XII intact, motor grossly intact   DEVICE  Normal device function.  See PaceArt for details.   Assess/Plan: 1. CHB - he has 2:1 conduction today. We will follow. He is asymptomatic, s/p PPM. 2. PPM - his medtronic DDD PM is working normally. 3. HTN - his blood pressure is up a little. He is encouraged to avoid salty food. 4. CAD - he denies anginal symptoms.   Mikle Bosworth.D.

## 2018-10-13 NOTE — Patient Instructions (Signed)
Medication Instructions:  Your physician recommends that you continue on your current medications as directed. Please refer to the Current Medication list given to you today.  Labwork: None ordered.  Testing/Procedures: None ordered.  Follow-Up: Your physician wants you to follow-up in: one year with Dr. Lovena Le.   You will receive a reminder letter in the mail two months in advance. If you don't receive a letter, please call our office to schedule the follow-up appointment.  Remote monitoring is used to monitor your Pacemaker from home. This monitoring reduces the number of office visits required to check your device to one time per year. It allows [place] to keep an eye on the functioning of your device to ensure it is working properly. You are scheduled for a device check from home on 01/16/2019. You may send your transmission at any time that day. If you have a wireless device, the transmission will be sent automatically. After your physician reviews your transmission, you will receive a postcard with your next transmission date.  Any Other Special Instructions Will Be Listed Below (If Applicable).  If you need a refill on your cardiac medications before your next appointment, please call your pharmacy.

## 2018-10-14 LAB — CUP PACEART REMOTE DEVICE CHECK
Battery Remaining Longevity: 89 mo
Battery Voltage: 3 V
Brady Statistic AP VP Percent: 5.05 %
Brady Statistic AP VS Percent: 0 %
Brady Statistic AS VS Percent: 1.53 %
Brady Statistic RA Percent Paced: 5.46 %
Brady Statistic RV Percent Paced: 98.47 %
Date Time Interrogation Session: 20200220111641
Implantable Lead Implant Date: 20190211
Implantable Lead Implant Date: 20190211
Implantable Lead Location: 753859
Implantable Lead Location: 753860
Implantable Lead Model: 3830
Implantable Lead Model: 5076
Implantable Pulse Generator Implant Date: 20190211
Lead Channel Impedance Value: 228 Ohm
Lead Channel Impedance Value: 285 Ohm
Lead Channel Impedance Value: 418 Ohm
Lead Channel Pacing Threshold Amplitude: 0.625 V
Lead Channel Pacing Threshold Amplitude: 2.375 V
Lead Channel Pacing Threshold Pulse Width: 0.4 ms
Lead Channel Pacing Threshold Pulse Width: 0.4 ms
Lead Channel Sensing Intrinsic Amplitude: 2.125 mV
Lead Channel Sensing Intrinsic Amplitude: 2.125 mV
Lead Channel Sensing Intrinsic Amplitude: 3.625 mV
Lead Channel Sensing Intrinsic Amplitude: 3.625 mV
Lead Channel Setting Pacing Amplitude: 1.5 V
Lead Channel Setting Pacing Amplitude: 2.5 V
Lead Channel Setting Pacing Pulse Width: 1 ms
Lead Channel Setting Sensing Sensitivity: 0.6 mV
MDC IDC MSMT LEADCHNL RA IMPEDANCE VALUE: 456 Ohm
MDC IDC STAT BRADY AS VP PERCENT: 93.42 %

## 2018-10-16 LAB — CUP PACEART INCLINIC DEVICE CHECK
Battery Voltage: 3 V
Brady Statistic AP VP Percent: 7.37 %
Brady Statistic AP VS Percent: 0 %
Brady Statistic AS VP Percent: 90.97 %
Brady Statistic AS VS Percent: 1.66 %
Brady Statistic RA Percent Paced: 7.83 %
Brady Statistic RV Percent Paced: 98.34 %
Date Time Interrogation Session: 20200221192326
Implantable Lead Implant Date: 20190211
Implantable Lead Implant Date: 20190211
Implantable Lead Location: 753859
Implantable Lead Location: 753860
Implantable Lead Model: 3830
Implantable Lead Model: 5076
Implantable Pulse Generator Implant Date: 20190211
Lead Channel Impedance Value: 247 Ohm
Lead Channel Impedance Value: 304 Ohm
Lead Channel Impedance Value: 437 Ohm
Lead Channel Impedance Value: 513 Ohm
Lead Channel Pacing Threshold Amplitude: 0.75 V
Lead Channel Pacing Threshold Amplitude: 2.375 V
Lead Channel Pacing Threshold Pulse Width: 0.4 ms
Lead Channel Pacing Threshold Pulse Width: 0.4 ms
Lead Channel Sensing Intrinsic Amplitude: 1.625 mV
Lead Channel Sensing Intrinsic Amplitude: 2.125 mV
Lead Channel Sensing Intrinsic Amplitude: 2.875 mV
Lead Channel Sensing Intrinsic Amplitude: 3.625 mV
Lead Channel Setting Pacing Amplitude: 1.5 V
Lead Channel Setting Pacing Amplitude: 2.5 V
Lead Channel Setting Pacing Pulse Width: 1 ms
Lead Channel Setting Sensing Sensitivity: 0.6 mV
MDC IDC MSMT BATTERY REMAINING LONGEVITY: 90 mo

## 2018-10-19 NOTE — Progress Notes (Signed)
Remote pacemaker transmission.   

## 2018-10-20 ENCOUNTER — Encounter: Payer: Self-pay | Admitting: Cardiology

## 2018-12-11 ENCOUNTER — Telehealth: Payer: Self-pay | Admitting: Cardiology

## 2018-12-11 NOTE — Progress Notes (Signed)
Virtual Visit via Telephone Note   This visit type was conducted due to national recommendations for restrictions regarding the COVID-19 Pandemic (e.g. social distancing) in an effort to limit this patient's exposure and mitigate transmission in our community.  Due to his co-morbid illnesses, this patient is at least at moderate risk for complications without adequate follow up.  This format is felt to be most appropriate for this patient at this time.  The patient did not have access to video technology/had technical difficulties with video requiring transitioning to audio format only (telephone).  All issues noted in this document were discussed and addressed.  No physical exam could be performed with this format.  Please refer to the patient's chart for his  consent to telehealth for [facility].   Evaluation Performed:  Follow-up visit  Date:  12/12/2018   ID:  Blake Burgess, Blake Burgess Sep 21, 1931, MRN [id number]  Patient Location: Home Provider Location: Home  PCP:  Lacie Draft, NP  Cardiologist:  Minus Breeding, MD  Electrophysiologist:  None   Chief Complaint:  CAD  History of Present Illness:    Blake Burgess is a 85 y.o. male with who presents for followup of his known coronary disease and CABG.  In 2012 he did have an abnormal stress test followed by catheterization which demonstrated patent bypass grafts.  His last stress in 2017 was unremarkable.  In Feb 2019 he had chest pain and SOB and came to the ED and was noted to be in CHB.  He had a pacemaker placed.  During that admission he had positive enzymes so after the last visit a stress test was ordered.  There was mild ischemia in the apex with an EF of 42%.  This was low risk so he was managed medically. Since I last saw him he had shoulder surgery.    He did well with this surgery.  He is done physical therapy.  He can now do just about everything except shaving is a little difficult.  He denies any chest pressure, neck or arm  discomfort.  He has had no shortness, PND or orthopnea.  He is had no weight gain or edema.  The patient does not have symptoms concerning for COVID-19 infection (fever, chills, cough, or new shortness of breath).    Past Medical History:  Diagnosis Date  . Aortic stenosis    mild AS 09/2017 echo  . Cancer ([place])    skin  . Coronary artery disease    a.  s/p CABG;   b. cath 4/12: EF 55%, 3vCAD, patent L-LAD, patent S-RCA, patent S-CFX (done after a false pos. ETT)  . Diverticular disease   . GERD (gastroesophageal reflux disease)   . GI bleed   . Hemorrhoids   . HH (hiatus hernia)   . History of kidney stones   . Hypertension   . Osteoarthritis   . Other and unspecified hyperlipidemia   . Presence of permanent cardiac pacemaker   . Schatzki's ring   . Stroke [facility])    Past Surgical History:  Procedure Laterality Date  . ARTERIOVENOUS GRAFT PLACEMENT W/ ENDOSCOPIC VEIN HARVEST     of the right leg greater spahenous vein. Surgeon: Tharon Aquas Trigt,M.D.  . COLONOSCOPY  02/24/2010   Hemorrhoids, Diverticulosis. Performed at [place]. Normal terminal ileum. Dr. June Leap, [place] GRAFT  06/21/2007   CABG x 3 Surgeon Ivin Poot, MD  . EYE SURGERY     bilateral  cataract removal  . hip replace  06/09/2004   left hip Surgeon Pietro Cassis. Alvan Dame, [place] IMPLANT N/A 10/03/2017   Procedure: PACEMAKER IMPLANT;  Surgeon: Evans Lance, MD;  Location: [place] CV LAB;  Service: Cardiovascular;  Laterality: N/A;  . REVERSE SHOULDER ARTHROPLASTY Right 08/25/2018   Procedure: REVERSE SHOULDER ARTHROPLASTY;  Surgeon: Netta Cedars, MD;  Location: [place];  Service: Orthopedics;  Laterality: Right;     Current Meds  Medication Sig  . acetaminophen (TYLENOL) 500 MG tablet Take 1,000 mg by mouth 3 (three) times daily.   Marland Kitchen amLODipine (NORVASC) 5 MG tablet Take 1 tablet (5 mg total) by mouth daily.  Marland Kitchen aspirin 81 MG tablet Take 1 tablet (81 mg total) by mouth  daily.  Marland Kitchen atorvastatin (LIPITOR) 10 MG tablet Take 10 mg by mouth daily.   . Cyanocobalamin 1000 MCG/ML KIT Inject 1,000 mcg as directed every 30 (thirty) days.  . enalapril (VASOTEC) 20 MG tablet Take 20 mg by mouth daily.   . meloxicam (MOBIC) 7.5 MG tablet Take 7.5 mg by mouth daily.   . metoprolol succinate (TOPROL-XL) 25 MG 24 hr tablet Take 25 mg by mouth daily.  Marland Kitchen omeprazole (PRILOSEC) 20 MG capsule Take 20 mg by mouth daily.   . tamsulosin (FLOMAX) 0.4 MG CAPS capsule Take 0.4 mg by mouth daily.  Marland Kitchen triamcinolone cream (KENALOG) 0.5 % Apply 1 application topically daily as needed (skin sores).     Allergies:   Patient has no known allergies.   Social History   Tobacco Use  . Smoking status: Never Smoker  . Smokeless tobacco: Never Used  Substance Use Topics  . Alcohol use: No  . Drug use: No     Family Hx: The patient's family history includes Diabetes in his brother, father, and sister; Heart attack in his mother; Hypertension in his mother.  ROS:   Please see the history of present illness.    As stated in the HPI and negative for all other systems.   Prior CV studies:   The following studies were reviewed today:  None  Labs/Other Tests and Data Reviewed:    EKG:  No ECG reviewed.  Recent Labs: 05/22/2018: ALT 14; B Natriuretic Peptide 278.9 08/25/2018: Platelets 280 08/26/2018: BUN 14; Creatinine, Ser 1.03; Hemoglobin 10.1; Potassium 3.5; Sodium 139   Recent Lipid Panel Lab Results  Component Value Date/Time   CHOL 112 10/01/2017 06:19 AM   TRIG 68 10/01/2017 06:19 AM   HDL 39 (L) 10/01/2017 06:19 AM   CHOLHDL 2.9 10/01/2017 06:19 AM   LDLCALC 59 10/01/2017 06:19 AM    Wt Readings from Last 3 Encounters:  12/12/18 167 lb (75.8 kg)  10/13/18 177 lb (80.3 kg)  08/25/18 176 lb (79.8 kg)     Objective:    Vital Signs:  BP (!) 208/98 Comment: Sunday: 142/60; Monday: 140/60  Pulse 76   Ht 5' 9"  (1.753 m)   Wt 167 lb (75.8 kg)   BMI 24.66 kg/m      ASSESSMENT & PLAN:    CHB/PACEMAKER PLACEMENT:   The patient is up to date with pacemaker follow up.  No change in therapy or follow up.   HTN:    His blood pressure is very elevated today but this is unusual he thinks an error and he will repeat this.  Previous readings have been much more in keeping with his last blood pressure recorded in an office.  He is getting keep a blood  pressure diary at home and then further adjustments to his medications will be based on this.  CHRONIC SYSTOLIC AND DIASTOLIC HF:  He had low normal EF in Feb of last year.  (50%).  He is not having any new symptoms.  No change in therapy.  AS:  He had mild AS on echo in Feb.  I will follow this clinically. Marland Kitchen      COVID-19 Education: The signs and symptoms of COVID-19 were discussed with the patient and how to seek care for testing (follow up with PCP or arrange E-visit).  The importance of social distancing was discussed today.  Time:   Today, I have spent 16 minutes with the patient with telehealth technology discussing the above problems.     Medication Adjustments/Labs and Tests Ordered: Current medicines are reviewed at length with the patient today.  Concerns regarding medicines are outlined above.   Tests Ordered: No orders of the defined types were placed in this encounter.   Medication Changes: No orders of the defined types were placed in this encounter.   Disposition:  Follow up 12 months.   Signed, Minus Breeding, MD  12/12/2018 11:11 AM    [facility]

## 2018-12-12 ENCOUNTER — Encounter: Payer: Self-pay | Admitting: Cardiology

## 2018-12-12 ENCOUNTER — Telehealth (INDEPENDENT_AMBULATORY_CARE_PROVIDER_SITE_OTHER): Payer: Medicare Other | Admitting: Cardiology

## 2018-12-12 VITALS — BP 208/98 | HR 76 | Ht 69.0 in | Wt 167.0 lb

## 2018-12-12 DIAGNOSIS — I5042 Chronic combined systolic (congestive) and diastolic (congestive) heart failure: Secondary | ICD-10-CM

## 2018-12-12 DIAGNOSIS — I350 Nonrheumatic aortic (valve) stenosis: Secondary | ICD-10-CM

## 2018-12-12 DIAGNOSIS — I442 Atrioventricular block, complete: Secondary | ICD-10-CM

## 2018-12-12 DIAGNOSIS — I10 Essential (primary) hypertension: Secondary | ICD-10-CM

## 2018-12-12 DIAGNOSIS — Z7189 Other specified counseling: Secondary | ICD-10-CM

## 2018-12-12 NOTE — Patient Instructions (Signed)

## 2019-01-16 ENCOUNTER — Ambulatory Visit (INDEPENDENT_AMBULATORY_CARE_PROVIDER_SITE_OTHER): Payer: Medicare Other | Admitting: *Deleted

## 2019-01-16 DIAGNOSIS — I442 Atrioventricular block, complete: Secondary | ICD-10-CM

## 2019-01-16 DIAGNOSIS — I5042 Chronic combined systolic (congestive) and diastolic (congestive) heart failure: Secondary | ICD-10-CM | POA: Diagnosis not present

## 2019-01-16 LAB — CUP PACEART REMOTE DEVICE CHECK
Battery Remaining Longevity: 86 mo
Battery Voltage: 2.99 V
Brady Statistic AP VP Percent: 4.25 %
Brady Statistic AP VS Percent: 0 %
Brady Statistic AS VP Percent: 93.76 %
Brady Statistic AS VS Percent: 1.98 %
Brady Statistic RA Percent Paced: 4.6 %
Brady Statistic RV Percent Paced: 98.01 %
Date Time Interrogation Session: 20200526052201
Implantable Lead Implant Date: 20190211
Implantable Lead Implant Date: 20190211
Implantable Lead Location: 753859
Implantable Lead Location: 753860
Implantable Lead Model: 3830
Implantable Lead Model: 5076
Implantable Pulse Generator Implant Date: 20190211
Lead Channel Impedance Value: 247 Ohm
Lead Channel Impedance Value: 285 Ohm
Lead Channel Impedance Value: 418 Ohm
Lead Channel Impedance Value: 494 Ohm
Lead Channel Pacing Threshold Amplitude: 0.75 V
Lead Channel Pacing Threshold Amplitude: 2.375 V
Lead Channel Pacing Threshold Pulse Width: 0.4 ms
Lead Channel Pacing Threshold Pulse Width: 0.4 ms
Lead Channel Sensing Intrinsic Amplitude: 2.25 mV
Lead Channel Sensing Intrinsic Amplitude: 2.25 mV
Lead Channel Sensing Intrinsic Amplitude: 2.375 mV
Lead Channel Sensing Intrinsic Amplitude: 2.375 mV
Lead Channel Setting Pacing Amplitude: 1.5 V
Lead Channel Setting Pacing Amplitude: 2.5 V
Lead Channel Setting Pacing Pulse Width: 1 ms
Lead Channel Setting Sensing Sensitivity: 0.6 mV

## 2019-01-24 NOTE — Progress Notes (Signed)
Remote pacemaker transmission.   

## 2019-02-20 NOTE — Telephone Encounter (Signed)
Open n error °

## 2019-03-19 ENCOUNTER — Other Ambulatory Visit: Payer: Self-pay | Admitting: Nurse Practitioner

## 2019-04-18 ENCOUNTER — Ambulatory Visit (INDEPENDENT_AMBULATORY_CARE_PROVIDER_SITE_OTHER): Payer: Medicare Other | Admitting: *Deleted

## 2019-04-18 DIAGNOSIS — I442 Atrioventricular block, complete: Secondary | ICD-10-CM | POA: Diagnosis not present

## 2019-04-18 LAB — CUP PACEART REMOTE DEVICE CHECK
Battery Remaining Longevity: 83 mo
Battery Voltage: 2.98 V
Brady Statistic AP VP Percent: 9.86 %
Brady Statistic AP VS Percent: 0 %
Brady Statistic AS VP Percent: 88.99 %
Brady Statistic AS VS Percent: 1.15 %
Brady Statistic RA Percent Paced: 10.35 %
Brady Statistic RV Percent Paced: 98.85 %
Date Time Interrogation Session: 20200826052049
Implantable Lead Implant Date: 20190211
Implantable Lead Implant Date: 20190211
Implantable Lead Location: 753859
Implantable Lead Location: 753860
Implantable Lead Model: 3830
Implantable Lead Model: 5076
Implantable Pulse Generator Implant Date: 20190211
Lead Channel Impedance Value: 228 Ohm
Lead Channel Impedance Value: 285 Ohm
Lead Channel Impedance Value: 418 Ohm
Lead Channel Impedance Value: 456 Ohm
Lead Channel Pacing Threshold Amplitude: 0.625 V
Lead Channel Pacing Threshold Amplitude: 1.875 V
Lead Channel Pacing Threshold Pulse Width: 0.4 ms
Lead Channel Pacing Threshold Pulse Width: 0.4 ms
Lead Channel Sensing Intrinsic Amplitude: 1.375 mV
Lead Channel Sensing Intrinsic Amplitude: 1.375 mV
Lead Channel Sensing Intrinsic Amplitude: 2.5 mV
Lead Channel Sensing Intrinsic Amplitude: 2.5 mV
Lead Channel Setting Pacing Amplitude: 1.5 V
Lead Channel Setting Pacing Amplitude: 2.5 V
Lead Channel Setting Pacing Pulse Width: 1 ms
Lead Channel Setting Sensing Sensitivity: 0.6 mV

## 2019-04-26 ENCOUNTER — Encounter: Payer: Self-pay | Admitting: Cardiology

## 2019-04-26 NOTE — Progress Notes (Signed)
Remote pacemaker transmission.   

## 2019-07-18 ENCOUNTER — Ambulatory Visit (INDEPENDENT_AMBULATORY_CARE_PROVIDER_SITE_OTHER): Payer: Medicare Other | Admitting: *Deleted

## 2019-07-18 DIAGNOSIS — I442 Atrioventricular block, complete: Secondary | ICD-10-CM

## 2019-07-18 LAB — CUP PACEART REMOTE DEVICE CHECK
Battery Remaining Longevity: 81 mo
Battery Voltage: 2.98 V
Brady Statistic AP VP Percent: 11.85 %
Brady Statistic AP VS Percent: 0 %
Brady Statistic AS VP Percent: 87.44 %
Brady Statistic AS VS Percent: 0.71 %
Brady Statistic RA Percent Paced: 12.13 %
Brady Statistic RV Percent Paced: 99.29 %
Date Time Interrogation Session: 20201125002105
Implantable Lead Implant Date: 20190211
Implantable Lead Implant Date: 20190211
Implantable Lead Location: 753859
Implantable Lead Location: 753860
Implantable Lead Model: 3830
Implantable Lead Model: 5076
Implantable Pulse Generator Implant Date: 20190211
Lead Channel Impedance Value: 228 Ohm
Lead Channel Impedance Value: 285 Ohm
Lead Channel Impedance Value: 437 Ohm
Lead Channel Impedance Value: 456 Ohm
Lead Channel Pacing Threshold Amplitude: 0.75 V
Lead Channel Pacing Threshold Amplitude: 2 V
Lead Channel Pacing Threshold Pulse Width: 0.4 ms
Lead Channel Pacing Threshold Pulse Width: 0.4 ms
Lead Channel Sensing Intrinsic Amplitude: 2.125 mV
Lead Channel Sensing Intrinsic Amplitude: 2.125 mV
Lead Channel Sensing Intrinsic Amplitude: 3.25 mV
Lead Channel Sensing Intrinsic Amplitude: 3.25 mV
Lead Channel Setting Pacing Amplitude: 1.5 V
Lead Channel Setting Pacing Amplitude: 2.5 V
Lead Channel Setting Pacing Pulse Width: 1 ms
Lead Channel Setting Sensing Sensitivity: 0.6 mV

## 2019-07-26 ENCOUNTER — Inpatient Hospital Stay
Admission: AD | Admit: 2019-07-26 | Payer: Medicare Other | Source: Other Acute Inpatient Hospital | Admitting: Internal Medicine

## 2019-07-27 DIAGNOSIS — R109 Unspecified abdominal pain: Secondary | ICD-10-CM

## 2019-07-27 DIAGNOSIS — I2510 Atherosclerotic heart disease of native coronary artery without angina pectoris: Secondary | ICD-10-CM

## 2019-07-27 DIAGNOSIS — K8050 Calculus of bile duct without cholangitis or cholecystitis without obstruction: Secondary | ICD-10-CM

## 2019-07-28 DIAGNOSIS — R109 Unspecified abdominal pain: Secondary | ICD-10-CM | POA: Diagnosis not present

## 2019-07-28 DIAGNOSIS — K8050 Calculus of bile duct without cholangitis or cholecystitis without obstruction: Secondary | ICD-10-CM | POA: Diagnosis not present

## 2019-07-28 DIAGNOSIS — I2510 Atherosclerotic heart disease of native coronary artery without angina pectoris: Secondary | ICD-10-CM | POA: Diagnosis not present

## 2019-07-29 DIAGNOSIS — K8050 Calculus of bile duct without cholangitis or cholecystitis without obstruction: Secondary | ICD-10-CM | POA: Diagnosis not present

## 2019-07-29 DIAGNOSIS — I2510 Atherosclerotic heart disease of native coronary artery without angina pectoris: Secondary | ICD-10-CM | POA: Diagnosis not present

## 2019-07-29 DIAGNOSIS — R109 Unspecified abdominal pain: Secondary | ICD-10-CM | POA: Diagnosis not present

## 2019-07-30 DIAGNOSIS — I2510 Atherosclerotic heart disease of native coronary artery without angina pectoris: Secondary | ICD-10-CM | POA: Diagnosis not present

## 2019-07-30 DIAGNOSIS — R109 Unspecified abdominal pain: Secondary | ICD-10-CM | POA: Diagnosis not present

## 2019-07-30 DIAGNOSIS — K8050 Calculus of bile duct without cholangitis or cholecystitis without obstruction: Secondary | ICD-10-CM | POA: Diagnosis not present

## 2019-07-31 DIAGNOSIS — R109 Unspecified abdominal pain: Secondary | ICD-10-CM | POA: Diagnosis not present

## 2019-07-31 DIAGNOSIS — I2510 Atherosclerotic heart disease of native coronary artery without angina pectoris: Secondary | ICD-10-CM | POA: Diagnosis not present

## 2019-07-31 DIAGNOSIS — K8050 Calculus of bile duct without cholangitis or cholecystitis without obstruction: Secondary | ICD-10-CM | POA: Diagnosis not present

## 2019-08-14 NOTE — Progress Notes (Signed)
PPM remote 

## 2019-08-15 ENCOUNTER — Telehealth: Payer: Self-pay

## 2019-08-15 NOTE — Telephone Encounter (Signed)
No phone note needed.

## 2019-09-13 ENCOUNTER — Telehealth: Payer: Self-pay | Admitting: Internal Medicine

## 2019-09-13 NOTE — Telephone Encounter (Signed)
New Message  Patient's son is calling in to get approval to accompany patient to his appointment on 11/06/19 at 3:15pm with Dr. Lovena Le. States that patient walks with a cain and is sometimes unsteady and will need him with him.

## 2019-09-17 NOTE — Telephone Encounter (Signed)
Call returned to Pt.  Advised ok for son to accompany him to appt in March.

## 2019-10-17 ENCOUNTER — Ambulatory Visit (INDEPENDENT_AMBULATORY_CARE_PROVIDER_SITE_OTHER): Payer: Medicare HMO | Admitting: *Deleted

## 2019-10-17 DIAGNOSIS — I442 Atrioventricular block, complete: Secondary | ICD-10-CM

## 2019-10-17 DIAGNOSIS — Z950 Presence of cardiac pacemaker: Secondary | ICD-10-CM

## 2019-10-17 LAB — CUP PACEART REMOTE DEVICE CHECK
Battery Remaining Longevity: 77 mo
Battery Voltage: 2.97 V
Brady Statistic AP VP Percent: 10.27 %
Brady Statistic AP VS Percent: 0 %
Brady Statistic AS VP Percent: 89.16 %
Brady Statistic AS VS Percent: 0.57 %
Brady Statistic RA Percent Paced: 10.5 %
Brady Statistic RV Percent Paced: 99.43 %
Date Time Interrogation Session: 20210224002128
Implantable Lead Implant Date: 20190211
Implantable Lead Implant Date: 20190211
Implantable Lead Location: 753859
Implantable Lead Location: 753860
Implantable Lead Model: 3830
Implantable Lead Model: 5076
Implantable Pulse Generator Implant Date: 20190211
Lead Channel Impedance Value: 247 Ohm
Lead Channel Impedance Value: 304 Ohm
Lead Channel Impedance Value: 418 Ohm
Lead Channel Impedance Value: 475 Ohm
Lead Channel Pacing Threshold Amplitude: 0.5 V
Lead Channel Pacing Threshold Amplitude: 1.25 V
Lead Channel Pacing Threshold Pulse Width: 0.4 ms
Lead Channel Pacing Threshold Pulse Width: 0.4 ms
Lead Channel Sensing Intrinsic Amplitude: 1.75 mV
Lead Channel Sensing Intrinsic Amplitude: 1.75 mV
Lead Channel Sensing Intrinsic Amplitude: 2.875 mV
Lead Channel Sensing Intrinsic Amplitude: 2.875 mV
Lead Channel Setting Pacing Amplitude: 1.5 V
Lead Channel Setting Pacing Amplitude: 2.5 V
Lead Channel Setting Pacing Pulse Width: 1 ms
Lead Channel Setting Sensing Sensitivity: 0.6 mV

## 2019-10-18 NOTE — Progress Notes (Signed)
PPM Remote  

## 2019-11-06 ENCOUNTER — Encounter: Payer: Self-pay | Admitting: Internal Medicine

## 2019-11-06 ENCOUNTER — Ambulatory Visit: Payer: Medicare HMO | Admitting: Internal Medicine

## 2019-11-06 ENCOUNTER — Encounter (INDEPENDENT_AMBULATORY_CARE_PROVIDER_SITE_OTHER): Payer: Self-pay

## 2019-11-06 ENCOUNTER — Other Ambulatory Visit: Payer: Self-pay

## 2019-11-06 VITALS — BP 144/60 | HR 64 | Ht 69.0 in | Wt 171.4 lb

## 2019-11-06 DIAGNOSIS — I442 Atrioventricular block, complete: Secondary | ICD-10-CM

## 2019-11-06 DIAGNOSIS — I2510 Atherosclerotic heart disease of native coronary artery without angina pectoris: Secondary | ICD-10-CM | POA: Diagnosis not present

## 2019-11-06 DIAGNOSIS — I10 Essential (primary) hypertension: Secondary | ICD-10-CM | POA: Diagnosis not present

## 2019-11-06 DIAGNOSIS — Z950 Presence of cardiac pacemaker: Secondary | ICD-10-CM | POA: Diagnosis not present

## 2019-11-06 NOTE — Patient Instructions (Signed)
Medication Instructions:  Your physician recommends that you continue on your current medications as directed. Please refer to the Current Medication list given to you today.  Labwork: None ordered.  Testing/Procedures: None ordered.  Follow-Up: Your physician wants you to follow-up in: one year with Dr. Lovena Le.   You will receive a reminder letter in the mail two months in advance. If you don't receive a letter, please call our office to schedule the follow-up appointment.  Remote monitoring is used to monitor your Pacemaker from home. This monitoring reduces the number of office visits required to check your device to one time per year. It allows [place] to keep an eye on the functioning of your device to ensure it is working properly. You are scheduled for a device check from home on 01/16/2020. You may send your transmission at any time that day. If you have a wireless device, the transmission will be sent automatically. After your physician reviews your transmission, you will receive a postcard with your next transmission date.  Any Other Special Instructions Will Be Listed Below (If Applicable).  If you need a refill on your cardiac medications before your next appointment, please call your pharmacy.

## 2019-11-06 NOTE — Progress Notes (Signed)
HPI [name] returns today for followup of CHB, s/p PPM insertion. He is a pleasant elderly man with CAD, HTN, who presented with symptomatic CHB back in February 2019, and underwent insertion of a Medtronic DDD PM with a His bundle lead placed. He has been stable from a cardiac perspective. He denies chest pain or sob. He has become a bit more sedentary. When he performs a strenuous activity, like changing the hose out on his tractor, he will get sob.   No Known Allergies   Current Outpatient Medications  Medication Sig Dispense Refill  . acetaminophen (TYLENOL) 500 MG tablet Take 1,000 mg by mouth 3 (three) times daily.     Marland Kitchen amLODipine (NORVASC) 2.5 MG tablet Take 2.5 mg by mouth daily.    Marland Kitchen aspirin 81 MG tablet Take 1 tablet (81 mg total) by mouth daily. 30 tablet   . atorvastatin (LIPITOR) 10 MG tablet Take 10 mg by mouth daily.     . Cyanocobalamin 1000 MCG/ML KIT Inject 1,000 mcg as directed every 30 (thirty) days.    . enalapril (VASOTEC) 20 MG tablet Take 20 mg by mouth daily.     Marland Kitchen lidocaine (LIDODERM) 5 % Place 1 patch onto the skin as needed for pain.    . meloxicam (MOBIC) 7.5 MG tablet Take 7.5 mg by mouth daily.     . metoprolol succinate (TOPROL-XL) 25 MG 24 hr tablet Take 25 mg by mouth daily.    . nitroGLYCERIN (NITROSTAT) 0.4 MG SL tablet Place 1 tablet under the tongue as needed for chest pain.    Marland Kitchen omeprazole (PRILOSEC) 20 MG capsule Take 20 mg by mouth daily.     . tamsulosin (FLOMAX) 0.4 MG CAPS capsule Take 0.4 mg by mouth daily.    Marland Kitchen triamcinolone cream (KENALOG) 0.5 % Apply 1 application topically daily as needed (skin sores).     No current facility-administered medications for this visit.     Past Medical History:  Diagnosis Date  . Aortic stenosis    mild AS 09/2017 echo  . Cancer ([place])    skin  . Coronary artery disease    a.  s/p CABG;   b. cath 4/12: EF 55%, 3vCAD, patent L-LAD, patent S-RCA, patent S-CFX (done after a false pos. ETT)  .  Diverticular disease   . GERD (gastroesophageal reflux disease)   . GI bleed   . Hemorrhoids   . HH (hiatus hernia)   . History of kidney stones   . Hypertension   . Osteoarthritis   . Other and unspecified hyperlipidemia   . Presence of permanent cardiac pacemaker   . Schatzki's ring   . Stroke ([place])     ROS:   All systems reviewed and negative except as noted in the HPI.   Past Surgical History:  Procedure Laterality Date  . ARTERIOVENOUS GRAFT PLACEMENT W/ ENDOSCOPIC VEIN HARVEST     of the right leg greater spahenous vein. Surgeon: Tharon Aquas Trigt,M.D.  . COLONOSCOPY  02/24/2010   Hemorrhoids, Diverticulosis. Performed at [place]. Normal terminal ileum. Dr. June Leap, [place] GRAFT  06/21/2007   CABG x 3 Surgeon Ivin Poot, MD  . EYE SURGERY     bilateral cataract removal  . hip replace  06/09/2004   left hip Surgeon Pietro Cassis. Alvan Dame, [place] IMPLANT N/A 10/03/2017   Procedure: PACEMAKER IMPLANT;  Surgeon: Evans Lance, MD;  Location: [place] CV LAB;  Service: Cardiovascular;  Laterality: N/A;  . REVERSE SHOULDER ARTHROPLASTY Right 08/25/2018   Procedure: REVERSE SHOULDER ARTHROPLASTY;  Surgeon: Netta Cedars, MD;  Location: [place];  Service: Orthopedics;  Laterality: Right;     Family History  Problem Relation Age of Onset  . Heart attack Mother   . Hypertension Mother   . Diabetes Father   . Diabetes Brother   . Diabetes Sister      Social History   Socioeconomic History  . Marital status: Married    Spouse name: Not on file  . Number of children: 2  . Years of education: Not on file  . Highest education level: Not on file  Occupational History    Employer: RETIRED  Tobacco Use  . Smoking status: Never Smoker  . Smokeless tobacco: Never Used  Substance and Sexual Activity  . Alcohol use: No  . Drug use: No  . Sexual activity: Not on file  Other Topics Concern  . Not on file  Social History Narrative   No  Regular exercise. Daily Caffeine: 24 oz pepsi and 1 cup coffee.    Social Determinants of Health   Financial Resource Strain:   . Difficulty of Paying Living Expenses:   Food Insecurity:   . Worried About Charity fundraiser in the Last Year:   . Arboriculturist in the Last Year:   Transportation Needs:   . Film/video editor (Medical):   Marland Kitchen Lack of Transportation (Non-Medical):   Physical Activity:   . Days of Exercise per Week:   . Minutes of Exercise per Session:   Stress:   . Feeling of Stress :   Social Connections:   . Frequency of Communication with Friends and Family:   . Frequency of Social Gatherings with Friends and Family:   . Attends Religious Services:   . Active Member of Clubs or Organizations:   . Attends Archivist Meetings:   Marland Kitchen Marital Status:   Intimate Partner Violence:   . Fear of Current or Ex-Partner:   . Emotionally Abused:   Marland Kitchen Physically Abused:   . Sexually Abused:      BP (!) 144/60   Pulse 64   Ht 5' 9"  (1.753 m)   Wt 171 lb 6.4 oz (77.7 kg)   SpO2 93%   BMI 25.31 kg/m   Physical Exam:  Well appearing NAD HEENT: Unremarkable Neck:  No JVD, no thyromegally Lymphatics:  No adenopathy Back:  No CVA tenderness Lungs:  Clear with no wheezes HEART:  Regular rate rhythm, no murmurs, no rubs, no clicks Abd:  soft, positive bowel sounds, no organomegally, no rebound, no guarding Ext:  2 plus pulses, no edema, no cyanosis, no clubbing Skin:  No rashes no nodules Neuro:  CN II through XII intact, motor grossly intact  EKG - NSR with his bundle pacing  DEVICE  Normal device function.  See PaceArt for details. R waves are chronically low.  Assess/Plan: 1. CHB - he is asymptomatic, s/p PPM insertion.  2. PPM - his Medtronic DDD PM is working normally. He is His bundle pacing nearly 100% and his paced QRS is around 90 ms.  3. CAD - he remains active and denies anginal symptoms although he does get tired when he tries to perform  strenuous activity. 4. HTN - his bp is fairly well controlled. He will maintain a low sodium diet.   Mikle Bosworth.D.

## 2019-11-08 LAB — CUP PACEART INCLINIC DEVICE CHECK
Battery Remaining Longevity: 106 mo
Battery Voltage: 2.97 V
Brady Statistic AP VP Percent: 9.58 %
Brady Statistic AP VS Percent: 0 %
Brady Statistic AS VP Percent: 89.35 %
Brady Statistic AS VS Percent: 1.07 %
Brady Statistic RA Percent Paced: 9.91 %
Brady Statistic RV Percent Paced: 98.93 %
Date Time Interrogation Session: 20210316153300
Implantable Lead Implant Date: 20190211
Implantable Lead Implant Date: 20190211
Implantable Lead Location: 753859
Implantable Lead Location: 753860
Implantable Lead Model: 3830
Implantable Lead Model: 5076
Implantable Pulse Generator Implant Date: 20190211
Lead Channel Impedance Value: 247 Ohm
Lead Channel Impedance Value: 342 Ohm
Lead Channel Impedance Value: 399 Ohm
Lead Channel Impedance Value: 532 Ohm
Lead Channel Pacing Threshold Amplitude: 0.625 V
Lead Channel Pacing Threshold Amplitude: 1 V
Lead Channel Pacing Threshold Pulse Width: 0.4 ms
Lead Channel Pacing Threshold Pulse Width: 0.4 ms
Lead Channel Sensing Intrinsic Amplitude: 1.625 mV
Lead Channel Sensing Intrinsic Amplitude: 2 mV
Lead Channel Sensing Intrinsic Amplitude: 2.75 mV
Lead Channel Sensing Intrinsic Amplitude: 3.75 mV
Lead Channel Setting Pacing Amplitude: 1.5 V
Lead Channel Setting Pacing Amplitude: 2.5 V
Lead Channel Setting Pacing Pulse Width: 0.4 ms
Lead Channel Setting Sensing Sensitivity: 0.6 mV

## 2019-12-12 NOTE — Progress Notes (Signed)
Cardiology Office Note   Date:  12/13/2019   ID:  Blake Burgess, Blake Burgess 1931-11-18, MRN [id number]  PCP:  Lacie Draft, NP  Cardiologist:   Minus Breeding, MD  Chief Complaint  Patient presents with  . Blurred Vision      History of Present Illness: Blake Burgess is a 86 y.o. male who presents for followup of his known coronary disease and CABG.  In 2012 he did have an abnormal stress test followed by catheterization which demonstrated patent bypass grafts.  His last stress in 2017 was unremarkable.  In Feb 2019 he had chest pain and SOB and came to the ED and was noted to be in CHB.  He had a pacemaker placed.  During that admission he had positive enzymes so after the last visit a stress test was ordered.  There was mild ischemia in the apex with an EF of 42%.  This was low risk so he was managed medically. Since I last saw him he did have an episode of blurred vision.  He was working on his tractor.  He said that his vision got blurry and he came in and asked his wife for nitroglycerin.  He spent most of the day at [facility] emergency room.  His son thinks he had a CT of his head.  He is not sure the other work-up.  He did see Dr. Lovena Le afterwards and had normal pacemaker function and there was no mention of any atrial arrhythmias such as fibrillation.  He has had some blurred vision since then and is due to see an ophthalmologist.  He did not feel his heart racing or skipping.  He has not had any chest pressure, neck or arm discomfort.  Has had no PND or orthopnea.  He does get dyspneic doing mild to moderate activity but he is very limited by severe osteoarthritis of his knees.    Past Medical History:  Diagnosis Date  . Aortic stenosis    mild AS 09/2017 echo  . Cancer ([place])    skin  . Coronary artery disease    a.  s/p CABG;   b. cath 4/12: EF 55%, 3vCAD, patent L-LAD, patent S-RCA, patent S-CFX (done after a false pos. ETT)  . Diverticular disease   . GERD (gastroesophageal  reflux disease)   . GI bleed   . Hemorrhoids   . HH (hiatus hernia)   . History of kidney stones   . Hypertension   . Osteoarthritis   . Other and unspecified hyperlipidemia   . Presence of permanent cardiac pacemaker   . Schatzki's ring   . Stroke [facility])     Past Surgical History:  Procedure Laterality Date  . ARTERIOVENOUS GRAFT PLACEMENT W/ ENDOSCOPIC VEIN HARVEST     of the right leg greater spahenous vein. Surgeon: Tharon Aquas Trigt,M.D.  . COLONOSCOPY  02/24/2010   Hemorrhoids, Diverticulosis. Performed at [facility]. Normal terminal ileum. Dr. June Leap, [place] GRAFT  06/21/2007   CABG x 3 Surgeon Ivin Poot, MD  . EYE SURGERY     bilateral cataract removal  . hip replace  06/09/2004   left hip Surgeon Pietro Cassis. Alvan Dame, [place] IMPLANT N/A 10/03/2017   Procedure: PACEMAKER IMPLANT;  Surgeon: Evans Lance, MD;  Location: [place] CV LAB;  Service: Cardiovascular;  Laterality: N/A;  . REVERSE SHOULDER ARTHROPLASTY Right 08/25/2018   Procedure: REVERSE SHOULDER ARTHROPLASTY;  Surgeon: Netta Cedars, MD;  Location: [place];  Service: Orthopedics;  Laterality: Right;     Current Outpatient Medications  Medication Sig Dispense Refill  . acetaminophen (TYLENOL) 500 MG tablet Take 1,000 mg by mouth 3 (three) times daily.     Marland Kitchen amLODipine (NORVASC) 2.5 MG tablet Take 2.5 mg by mouth daily.    Marland Kitchen aspirin 81 MG tablet Take 1 tablet (81 mg total) by mouth daily. 30 tablet   . atorvastatin (LIPITOR) 10 MG tablet Take 10 mg by mouth daily.     . Cyanocobalamin 1000 MCG/ML KIT Inject 1,000 mcg as directed every 30 (thirty) days.    . enalapril (VASOTEC) 20 MG tablet Take 20 mg by mouth daily.     Marland Kitchen lidocaine (LIDODERM) 5 % Place 1 patch onto the skin as needed for pain.    . metoprolol succinate (TOPROL-XL) 25 MG 24 hr tablet Take 25 mg by mouth daily.    . nitroGLYCERIN (NITROSTAT) 0.4 MG SL tablet Place 1 tablet under the tongue as needed for chest  pain.    Marland Kitchen omeprazole (PRILOSEC) 20 MG capsule Take 20 mg by mouth daily.     . tamsulosin (FLOMAX) 0.4 MG CAPS capsule Take 0.4 mg by mouth daily.    . meloxicam (MOBIC) 7.5 MG tablet Take 7.5 mg by mouth daily.     Marland Kitchen triamcinolone cream (KENALOG) 0.5 % Apply 1 application topically daily as needed (skin sores).     No current facility-administered medications for this visit.    Allergies:   Patient has no known allergies.    ROS:  Please see the history of present illness.   Otherwise, review of systems are positive for none.   All other systems are reviewed and negative.    PHYSICAL EXAM: VS:  BP (!) 144/82   Pulse 76   Ht _0  (1.753 m)   Wt 169 lb 6.4 oz (76.8 kg)   SpO2 94%   BMI 25.02 kg/m  , BMI Body mass index is 25.02 kg/m. GENERAL:  Well appearing NECK:  No jugular venous distention, waveform within normal limits, carotid upstroke brisk and symmetric, no bruits, no thyromegaly LUNGS:  Clear to auscultation bilaterally CHEST:  Well healed pacemaker pocket.   HEART:  PMI not displaced or sustained,S1 and S2 within normal limits, no S3, no S4, no clicks, no rubs, 3 out of 6 apical systolic murmur radiating slightly at the aortic outflow tract, no diastolic murmurs ABD:  Flat, positive bowel sounds normal in frequency in pitch, no bruits, no rebound, no guarding, no midline pulsatile mass, no hepatomegaly, no splenomegaly EXT:  2 plus pulses throughout, no edema, no cyanosis no clubbing   EKG:  EKG is ordered today. The ekg ordered today demonstrates significant baseline artifact.  Appears to be sinus with ventricular pacing but it is fairly uninterpretable   Recent Labs: No results found for requested labs within last 8760 hours.    Lipid Panel    Component Value Date/Time   CHOL 112 10/01/2017 0619   TRIG 68 10/01/2017 0619   HDL 39 (L) 10/01/2017 0619   CHOLHDL 2.9 10/01/2017 0619   VLDL 14 10/01/2017 0619   LDLCALC 59 10/01/2017 0619      Wt Readings  from Last 3 Encounters:  12/13/19 169 lb 6.4 oz (76.8 kg)  11/06/19 171 lb 6.4 oz (77.7 kg)  12/12/18 167 lb (75.8 kg)      Other studies Reviewed: Additional studies/ records that were reviewed today include: EP records. Review  of the above records demonstrates:  Please see elsewhere in the note.     ASSESSMENT AND PLAN:  CHB/PACEMAKER PLACEMENT:    This seems to be up-to-date and functioning normally with no significant arrhythmias noted on interrogation.   HTN:     Very slightly elevated blood pressure but otherwise well treated.  Advised to make any changes.   CHRONIC SYSTOLIC AND DIASTOLIC HF:   He does have some chronic shortness of breath but I think he is euvolemic.  He has a mildly reduced ejection fraction.  No change in therapy.   AS:  He had mild AS on echo in Feb 2019.  I would not think clinically that this is changed.  At this point no further echocardiography.  BLURRED VISION: Admit to get carotid Dopplers.  I am not clear the etiology of this and he is going to see ophthalmology.  COVID EDUCATION: He has been vaccinated.   Current medicines are reviewed at length with the patient today.  The patient does not have concerns regarding medicines.  The following changes have been made:  no change  Labs/ tests ordered today include:   Orders Placed This Encounter  Procedures  . EKG 12-Lead  . VAS US CAROTID     Disposition:   FU with me in 3 months    Signed, Minus Breeding, MD  12/13/2019 11:04 AM    [place]

## 2019-12-13 ENCOUNTER — Ambulatory Visit: Payer: Medicare HMO | Admitting: Cardiology

## 2019-12-13 ENCOUNTER — Encounter: Payer: Self-pay | Admitting: Cardiology

## 2019-12-13 ENCOUNTER — Other Ambulatory Visit: Payer: Self-pay

## 2019-12-13 VITALS — BP 144/82 | HR 76 | Ht 69.0 in | Wt 169.4 lb

## 2019-12-13 DIAGNOSIS — Z7189 Other specified counseling: Secondary | ICD-10-CM

## 2019-12-13 DIAGNOSIS — I5042 Chronic combined systolic (congestive) and diastolic (congestive) heart failure: Secondary | ICD-10-CM | POA: Diagnosis not present

## 2019-12-13 DIAGNOSIS — R0989 Other specified symptoms and signs involving the circulatory and respiratory systems: Secondary | ICD-10-CM

## 2019-12-13 DIAGNOSIS — I10 Essential (primary) hypertension: Secondary | ICD-10-CM | POA: Diagnosis not present

## 2019-12-13 NOTE — Patient Instructions (Signed)
Medication Instructions:  NO CHANGES *If you need a refill on your cardiac medications before your next appointment, please call your pharmacy*  Lab Work: NONE ORDERED THIS VISIT  Testing/Procedures: Your physician has requested that you have a carotid duplex. This test is an ultrasound of the carotid arteries in your neck. It looks at blood flow through these arteries that supply the brain with blood. Allow one hour for this exam. There are no restrictions or special instructions.  Follow-Up: At [facility], you and your health needs are our priority.  As part of our continuing mission to provide you with exceptional heart care, we have created designated Provider Care Teams.  These Care Teams include your primary Cardiologist (physician) and Advanced Practice Providers (APPs -  Physician Assistants and Nurse Practitioners) who all work together to provide you with the care you need, when you need it.  Your next appointment:   3 month(s)  The format for your next appointment:   In Person  Provider:   Minus Breeding, MD

## 2019-12-18 ENCOUNTER — Ambulatory Visit (HOSPITAL_COMMUNITY)
Admission: RE | Admit: 2019-12-18 | Discharge: 2019-12-18 | Disposition: A | Discharge status: Home / Self Care | Payer: Medicare HMO | Source: Ambulatory Visit | Attending: Cardiovascular Disease | Admitting: Cardiovascular Disease

## 2019-12-18 ENCOUNTER — Other Ambulatory Visit: Payer: Self-pay

## 2019-12-18 DIAGNOSIS — R0989 Other specified symptoms and signs involving the circulatory and respiratory systems: Secondary | ICD-10-CM | POA: Diagnosis present

## 2020-01-16 ENCOUNTER — Ambulatory Visit (INDEPENDENT_AMBULATORY_CARE_PROVIDER_SITE_OTHER): Payer: Medicare HMO | Admitting: *Deleted

## 2020-01-16 DIAGNOSIS — I442 Atrioventricular block, complete: Secondary | ICD-10-CM | POA: Diagnosis not present

## 2020-01-16 LAB — CUP PACEART REMOTE DEVICE CHECK
Battery Remaining Longevity: 100 mo
Battery Voltage: 2.98 V
Brady Statistic AP VP Percent: 14.55 %
Brady Statistic AP VS Percent: 0 %
Brady Statistic AS VP Percent: 85.08 %
Brady Statistic AS VS Percent: 0.37 %
Brady Statistic RA Percent Paced: 14.72 %
Brady Statistic RV Percent Paced: 99.63 %
Date Time Interrogation Session: 20210526011932
Implantable Lead Implant Date: 20190211
Implantable Lead Implant Date: 20190211
Implantable Lead Location: 753859
Implantable Lead Location: 753860
Implantable Lead Model: 3830
Implantable Lead Model: 5076
Implantable Pulse Generator Implant Date: 20190211
Lead Channel Impedance Value: 228 Ohm
Lead Channel Impedance Value: 285 Ohm
Lead Channel Impedance Value: 361 Ohm
Lead Channel Impedance Value: 456 Ohm
Lead Channel Pacing Threshold Amplitude: 0.75 V
Lead Channel Pacing Threshold Amplitude: 2.125 V
Lead Channel Pacing Threshold Pulse Width: 0.4 ms
Lead Channel Pacing Threshold Pulse Width: 0.4 ms
Lead Channel Sensing Intrinsic Amplitude: 1.875 mV
Lead Channel Sensing Intrinsic Amplitude: 1.875 mV
Lead Channel Sensing Intrinsic Amplitude: 3.25 mV
Lead Channel Sensing Intrinsic Amplitude: 3.25 mV
Lead Channel Setting Pacing Amplitude: 1.5 V
Lead Channel Setting Pacing Amplitude: 2.5 V
Lead Channel Setting Pacing Pulse Width: 0.4 ms
Lead Channel Setting Sensing Sensitivity: 0.6 mV

## 2020-01-16 NOTE — Progress Notes (Signed)
Remote pacemaker transmission.   

## 2020-03-07 ENCOUNTER — Ambulatory Visit: Payer: Medicare HMO | Admitting: Cardiology

## 2020-04-02 ENCOUNTER — Telehealth: Payer: Self-pay | Admitting: *Deleted

## 2020-04-02 NOTE — Telephone Encounter (Signed)
Attempted to reach pt to discuss unscheduled manual transmission. Line is busy, will try again later.

## 2020-04-03 NOTE — Telephone Encounter (Signed)
Spoke with pt. He reports monitor lit up on 8/10, so he pushed the button. Pt denies any cardiac concerns. Advised monitor was likely doing a software update.  Advised that next transmission on 04/16/20 should be received automatically. Pt verbalizes understanding and appreciation of call.

## 2020-04-16 ENCOUNTER — Ambulatory Visit (INDEPENDENT_AMBULATORY_CARE_PROVIDER_SITE_OTHER): Payer: Medicare HMO | Admitting: *Deleted

## 2020-04-16 DIAGNOSIS — I442 Atrioventricular block, complete: Secondary | ICD-10-CM | POA: Diagnosis not present

## 2020-04-16 LAB — CUP PACEART REMOTE DEVICE CHECK
Battery Remaining Longevity: 97 mo
Battery Voltage: 2.98 V
Brady Statistic AP VP Percent: 16.49 %
Brady Statistic AP VS Percent: 0 %
Brady Statistic AS VP Percent: 83.34 %
Brady Statistic AS VS Percent: 0.17 %
Brady Statistic RA Percent Paced: 16.56 %
Brady Statistic RV Percent Paced: 99.83 %
Date Time Interrogation Session: 20210825012115
Implantable Lead Implant Date: 20190211
Implantable Lead Implant Date: 20190211
Implantable Lead Location: 753859
Implantable Lead Location: 753860
Implantable Lead Model: 3830
Implantable Lead Model: 5076
Implantable Pulse Generator Implant Date: 20190211
Lead Channel Impedance Value: 228 Ohm
Lead Channel Impedance Value: 285 Ohm
Lead Channel Impedance Value: 380 Ohm
Lead Channel Impedance Value: 437 Ohm
Lead Channel Pacing Threshold Amplitude: 0.625 V
Lead Channel Pacing Threshold Amplitude: 2 V
Lead Channel Pacing Threshold Pulse Width: 0.4 ms
Lead Channel Pacing Threshold Pulse Width: 0.4 ms
Lead Channel Sensing Intrinsic Amplitude: 1.375 mV
Lead Channel Sensing Intrinsic Amplitude: 1.375 mV
Lead Channel Sensing Intrinsic Amplitude: 3.625 mV
Lead Channel Sensing Intrinsic Amplitude: 3.625 mV
Lead Channel Setting Pacing Amplitude: 1.5 V
Lead Channel Setting Pacing Amplitude: 2.5 V
Lead Channel Setting Pacing Pulse Width: 0.4 ms
Lead Channel Setting Sensing Sensitivity: 0.6 mV

## 2020-04-21 NOTE — Progress Notes (Signed)
Remote pacemaker transmission.   

## 2020-05-05 NOTE — Progress Notes (Signed)
Cardiology Office Note   Date:  05/07/2020   ID:  Blake Burgess, DOB 06/03/1932, MRN [id number]  PCP:  Lacie Draft, NP  Cardiologist:   Minus Breeding, MD  Chief Complaint  Patient presents with  . Chest Pain      History of Present Illness: Blake Burgess is a 86 y.o. male who presents for followup of his known coronary disease and CABG.  In 2012 he did have an abnormal stress test followed by catheterization which demonstrated patent bypass grafts.  His last stress in 2017 was unremarkable.  In Feb 2019 he had chest pain and SOB and came to the ED and was noted to be in CHB.  He had a pacemaker placed.  During that admission he had positive enzymes so after the last visit a stress test was ordered.  There was mild ischemia in the apex with an EF of 42%.  This was low risk so he was managed medically.   Since I last saw him he he does get some episodes of lightheadedness mildly but has not had any presyncope or syncope.  Previously he had some blurry vision and actually took a nitroglycerin for it when he was working on his tractor but he is not having that problem anymore.  He is getting chest discomfort he says couple times a week.  It is a mid sternal discomfort that he says only happens with exertion but he says it only lasts for a few seconds.  He has not taken a nitroglycerin.  He says he gets "pretty bad."  He denies any sustained symptoms such as neck or arm discomfort.  He has had no new shortness of breath, PND or orthopnea.  He is mostly limited by his joints.    Past Medical History:  Diagnosis Date  . Aortic stenosis    mild AS 09/2017 echo  . Cancer ([place])    skin  . Coronary artery disease    a.  s/p CABG;   b. cath 4/12: EF 55%, 3vCAD, patent L-LAD, patent S-RCA, patent S-CFX (done after a false pos. ETT)  . Diverticular disease   . GERD (gastroesophageal reflux disease)   . GI bleed   . Hemorrhoids   . HH (hiatus hernia)   . History of kidney stones   .  Hypertension   . Osteoarthritis   . Other and unspecified hyperlipidemia   . Presence of permanent cardiac pacemaker   . Schatzki's ring   . Stroke [facility])     Past Surgical History:  Procedure Laterality Date  . ARTERIOVENOUS GRAFT PLACEMENT W/ ENDOSCOPIC VEIN HARVEST     of the right leg greater spahenous vein. Surgeon: Tharon Aquas Trigt,M.D.  . COLONOSCOPY  02/24/2010   Hemorrhoids, Diverticulosis. Performed at [facility]. Normal terminal ileum. Dr. June Leap, [place] GRAFT  06/21/2007   CABG x 3 Surgeon Ivin Poot, MD  . EYE SURGERY     bilateral cataract removal  . hip replace  06/09/2004   left hip Surgeon Pietro Cassis. Alvan Dame, [place] IMPLANT N/A 10/03/2017   Procedure: PACEMAKER IMPLANT;  Surgeon: Evans Lance, MD;  Location: [place] CV LAB;  Service: Cardiovascular;  Laterality: N/A;  . REVERSE SHOULDER ARTHROPLASTY Right 08/25/2018   Procedure: REVERSE SHOULDER ARTHROPLASTY;  Surgeon: Netta Cedars, MD;  Location: [place];  Service: Orthopedics;  Laterality: Right;     Current Outpatient Medications  Medication Sig Dispense Refill  .  acetaminophen (TYLENOL) 500 MG tablet Take 1,000 mg by mouth 3 (three) times daily.     Marland Kitchen amLODipine (NORVASC) 2.5 MG tablet Take 2.5 mg by mouth daily.    Marland Kitchen aspirin 81 MG tablet Take 1 tablet (81 mg total) by mouth daily. 30 tablet   . atorvastatin (LIPITOR) 10 MG tablet Take 10 mg by mouth daily.     . Cyanocobalamin 1000 MCG/ML KIT Inject 1,000 mcg as directed every 30 (thirty) days.    . enalapril (VASOTEC) 20 MG tablet Take 20 mg by mouth daily.     Marland Kitchen lidocaine (LIDODERM) 5 % Place 1 patch onto the skin as needed for pain.    . meloxicam (MOBIC) 7.5 MG tablet Take 7.5 mg by mouth daily.     . metoprolol succinate (TOPROL-XL) 25 MG 24 hr tablet Take 25 mg by mouth daily.    . nitroGLYCERIN (NITROSTAT) 0.4 MG SL tablet Place 1 tablet under the tongue as needed for chest pain.    Marland Kitchen omeprazole (PRILOSEC) 20 MG  capsule Take 20 mg by mouth daily.     . tamsulosin (FLOMAX) 0.4 MG CAPS capsule Take 0.4 mg by mouth daily.    Marland Kitchen triamcinolone cream (KENALOG) 0.5 % Apply 1 application topically daily as needed (skin sores).    . isosorbide mononitrate (IMDUR) 30 MG 24 hr tablet Take 1 tablet (30 mg total) by mouth daily. 90 tablet 3   No current facility-administered medications for this visit.    Allergies:   Patient has no known allergies.    ROS:  Please see the history of present illness.   Otherwise, review of systems are positive for none.   All other systems are reviewed and negative.    PHYSICAL EXAM: VS:  BP (!) 162/64   Pulse 72   Ht 5' 9"  (1.753 m)   Wt 173 lb (78.5 kg)   SpO2 99%   BMI 25.55 kg/m  , BMI Body mass index is 25.55 kg/m. GENERAL:  Well appearing NECK:  No jugular venous distention, waveform within normal limits, carotid upstroke brisk and symmetric, no bruits, no thyromegaly LUNGS:  Clear to auscultation bilaterally CHEST:  Unremarkable HEART:  PMI not displaced or sustained,S1 and S2 within normal limits, no S3, no S4, no clicks, no rubs, 3 out of 6 apical systolic murmur radiating up the aortic outflow tract and into the carotids, no diastolic murmurs ABD:  Flat, positive bowel sounds normal in frequency in pitch, no bruits, no rebound, no guarding, no midline pulsatile mass, no hepatomegaly, no splenomegaly EXT:  2 plus pulses throughout, no edema, no cyanosis no clubbing   EKG:  EKG is  not ordered today.   Recent Labs: No results found for requested labs within last 8760 hours.    Lipid Panel    Component Value Date/Time   CHOL 112 10/01/2017 0619   TRIG 68 10/01/2017 0619   HDL 39 (L) 10/01/2017 0619   CHOLHDL 2.9 10/01/2017 0619   VLDL 14 10/01/2017 0619   LDLCALC 59 10/01/2017 0619      Wt Readings from Last 3 Encounters:  05/06/20 173 lb (78.5 kg)  12/13/19 169 lb 6.4 oz (76.8 kg)  11/06/19 171 lb 6.4 oz (77.7 kg)      Other studies  Reviewed: Additional studies/ records that were reviewed today include:    Review of the above records demonstrates:  Please see elsewhere in the note.     ASSESSMENT AND PLAN:  CHB/PACEMAKER PLACEMENT:  He is up-to-date with this.  No change in therapy.  CHEST PAIN:   His pain is atypical.  He did not have high risk findings on a perfusion study in 2019.  However, I am going to assume that this might be angina.  I am going to be fairly conservative and start with Imdur 30 mg daily.  He is invited to call back and if he is continuing to have symptoms I can go up on the dose but if they get worse he might need further evaluation.  HTN:     His blood pressure is elevated today but he says this is unusual and he can keep a blood pressure diary.  I will be adding the medication as above.  CHRONIC SYSTOLIC AND DIASTOLIC HF:   He seems to be euvolemic.  He has had a well-preserved ejection fraction on echo in 2019.  He has some mild left leg edema.  No change in therapy.  AS:  He had mild AS on echo in Feb 2019.    At this point I am not planning follow-up echo but will consider this if he continues to have symptoms.   BLURRED VISION:    He had negative carotid Dopplers.  I would ask him to follow-up with his primary provider.  COVID EDUCATION:     He has been vaccinated.  Current medicines are reviewed at length with the patient today.  The patient does not have concerns regarding medicines.  The following changes have been made:  None  Labs/ tests ordered today include: None  No orders of the defined types were placed in this encounter.   Disposition:   FU with me 3 months    Signed, Minus Breeding, MD  05/07/2020 1:02 PM    [facility]

## 2020-05-06 ENCOUNTER — Other Ambulatory Visit: Payer: Self-pay

## 2020-05-06 ENCOUNTER — Ambulatory Visit (INDEPENDENT_AMBULATORY_CARE_PROVIDER_SITE_OTHER): Payer: Medicare HMO | Admitting: Cardiology

## 2020-05-06 VITALS — BP 162/64 | HR 72 | Ht 69.0 in | Wt 173.0 lb

## 2020-05-06 DIAGNOSIS — I350 Nonrheumatic aortic (valve) stenosis: Secondary | ICD-10-CM | POA: Diagnosis not present

## 2020-05-06 DIAGNOSIS — I442 Atrioventricular block, complete: Secondary | ICD-10-CM

## 2020-05-06 DIAGNOSIS — I5042 Chronic combined systolic (congestive) and diastolic (congestive) heart failure: Secondary | ICD-10-CM | POA: Diagnosis not present

## 2020-05-06 DIAGNOSIS — I10 Essential (primary) hypertension: Secondary | ICD-10-CM

## 2020-05-06 DIAGNOSIS — Z7189 Other specified counseling: Secondary | ICD-10-CM

## 2020-05-06 MED ORDER — ISOSORBIDE MONONITRATE ER 30 MG PO TB24: 30.0000 mg | ORAL_TABLET | Freq: Every day | ORAL | 3 refills | Status: DC

## 2020-05-06 NOTE — Patient Instructions (Signed)
Medication Instructions:  START ISOSORBIDE MONONITRATE ( IMDUR ) 30 MG ONE TABLET DAILY   *If you need a refill on your cardiac medications before your next appointment, please call your pharmacy*   Lab Work: NOT NEEDED  Testing/Procedures: NOT NEEDED  Follow-Up: At [facility], you and your health needs are our priority.  As part of our continuing mission to provide you with exceptional heart care, we have created designated Provider Care Teams.  These Care Teams include your primary Cardiologist (physician) and Advanced Practice Providers (APPs -  Physician Assistants and Nurse Practitioners) who all work together to provide you with the care you need, when you need it.  We recommend signing up for the patient portal called "MyChart".  Sign up information is provided on this After Visit Summary.  MyChart is used to connect with patients for Virtual Visits (Telemedicine).  Patients are able to view lab/test results, encounter notes, upcoming appointments, etc.  Non-urgent messages can be sent to your provider as well.   To learn more about what you can do with MyChart, go to [email or web address].    Your next appointment:   3 month(s)  The format for your next appointment:   In Person  Provider:   Minus Breeding, MD

## 2020-05-07 ENCOUNTER — Encounter: Payer: Self-pay | Admitting: Cardiology

## 2020-07-11 ENCOUNTER — Telehealth: Payer: Self-pay | Admitting: Cardiology

## 2020-07-11 NOTE — Telephone Encounter (Signed)
Darion with [facility] called to schedule patient for an urgent hospital follow up appointment. Per Darion, patient was admitted on yesterday with dx of chest pain and dizziness. Patient is set to be discharged soon and she stated the patient needs to be seen by Dr. Percival Spanish sometime next week. Patient has an appointment scheduled for 08/06/20 and I made her aware that Dr. Llana Aliment do not have availability any sooner than this. I also added patient to wait list. She would like to know if patient can be worked in. Please advise.  Phone#: [phone]

## 2020-07-11 NOTE — Telephone Encounter (Signed)
I am in the hospital next week.  Patient apparently in the hospital today so I cannot see him.  If we cannot make room for him with an APP next week he will need to be added to the DOD schedule.

## 2020-07-14 ENCOUNTER — Ambulatory Visit: Payer: Medicare HMO | Admitting: Internal Medicine

## 2020-07-14 ENCOUNTER — Encounter: Payer: Self-pay | Admitting: Internal Medicine

## 2020-07-14 ENCOUNTER — Other Ambulatory Visit: Payer: Self-pay

## 2020-07-14 VITALS — BP 154/59 | HR 61 | Ht 69.0 in | Wt 170.2 lb

## 2020-07-14 DIAGNOSIS — I350 Nonrheumatic aortic (valve) stenosis: Secondary | ICD-10-CM

## 2020-07-14 DIAGNOSIS — I442 Atrioventricular block, complete: Secondary | ICD-10-CM | POA: Diagnosis not present

## 2020-07-14 DIAGNOSIS — I5022 Chronic systolic (congestive) heart failure: Secondary | ICD-10-CM

## 2020-07-14 DIAGNOSIS — Z950 Presence of cardiac pacemaker: Secondary | ICD-10-CM | POA: Diagnosis not present

## 2020-07-14 DIAGNOSIS — R079 Chest pain, unspecified: Secondary | ICD-10-CM

## 2020-07-14 DIAGNOSIS — I10 Essential (primary) hypertension: Secondary | ICD-10-CM

## 2020-07-14 NOTE — Patient Instructions (Signed)
Medication Instructions:  No Changes In Medications at this time.  *If you need a refill on your cardiac medications before your next appointment, please call your pharmacy*  Lab Work: None Ordered At This Time.  If you have labs (blood work) drawn today and your tests are completely normal, you will receive your results only by: Marland Kitchen MyChart Message (if you have MyChart) OR . A paper copy in the mail If you have any lab test that is abnormal or we need to change your treatment, we will call you to review the results.  Testing/Procedures: Your physician has requested that you have a lexiscan myoview. For further information please visit [email or web address]. Please follow instruction sheet, as given.   The test will take approximately 3 to 4 hours to complete; you may bring reading material.  If someone comes with you to your appointment, they will need to remain in the main lobby due to limited space in the testing area.    How to prepare for your Myocardial Perfusion Test:  Do not eat or drink 3 hours prior to your test, except you may have water.  Do not consume products containing caffeine (regular or decaffeinated) 12 hours prior to your test. (ex: coffee, chocolate, sodas, tea).  Do wear comfortable clothes (no dresses or overalls) and walking shoes, tennis shoes preferred (No heels or open toe shoes are allowed).  Do NOT wear cologne, perfume, aftershave, or lotions (deodorant is allowed).  If you use an inhaler, use it the AM of your test and bring it with you.   If you use a nebulizer, use it the AM of your test.   If these instructions are not followed, your test will have to be rescheduled.  Follow-Up: At [facility], you and your health needs are our priority.  As part of our continuing mission to provide you with exceptional heart care, we have created designated Provider Care Teams.  These Care Teams include your primary Cardiologist (physician) and Advanced Practice  Providers (APPs -  Physician Assistants and Nurse Practitioners) who all work together to provide you with the care you need, when you need it.  We recommend signing up for the patient portal called "MyChart".  Sign up information is provided on this After Visit Summary.  MyChart is used to connect with patients for Virtual Visits (Telemedicine).  Patients are able to view lab/test results, encounter notes, upcoming appointments, etc.  Non-urgent messages can be sent to your provider as well.   To learn more about what you can do with MyChart, go to [email or web address].    Your next appointment:   KEEP APPOINTMENT WITH DR. [facility] 08/06/20  The format for your next appointment:   In Person  Provider:   Minus Breeding, MD

## 2020-07-14 NOTE — Progress Notes (Signed)
Cardiology Office Note:    Date:  07/14/2020   ID:  DEAUNTE DENTE, DOB 1932-06-29, MRN [id number]  PCP:  Raelene Bott, MD  Cardiologist:  Minus Breeding, MD  Electrophysiologist:  None   Referring MD: Lacie Draft, NP   Chief Complaint/Reason for Referral: Urgent post hospital visit  History of Present Illness:    Blake Burgess is a 86 y.o. male who is a patient of Dr. Percival Spanish.  I was asked to see the patient as an urgent add-on today in my role as DOD for post hospital follow-up of chest pain.  I reviewed the notes from Dr. Percival Spanish at their last visit in September 2021, and have also reviewed in great detail records in care everywhere. Son Blake Burgess joins him for visit.  Rackley provides additional history.  He notes that [name] has been quite short of breath with transfers.  He does not know if this is longstanding since normally he does not see him move around too much when he goes to visit them.  [name] has a history of coronary artery disease status post CABG.  In 2012 he had an abnormal stress test followed by a catheterization which demonstrated patent bypass grafts.  His last stress test was in 2017 and was unremarkable.  He had chest pain in February 2019 with shortness of breath and was noted to be in complete heart block and at that time received a pacemaker.  Repeat stress test performed during that visit due to positive biomarkers, nuclear stress test noted mild ischemia at the apex with an EF of 42%.  This was felt to be overall low risk.  He has been medically managed.  He described for Dr. Percival Spanish several episodes of chest discomfort a couple of times a week.  It is mild overall and only happens with exertion and lasts a few seconds.  He has not taken a nitroglycerin for this pain.  He reported to Dr. Percival Spanish that he gets "pretty bad".  I reviewed records from [facility] where the patient was admitted from 07/10/2020 to 07/11/2020.  He was seen at his  orthopedist office and received Synvisc injection in his left knee for left knee pain.  Afterward he had sudden onset of dizziness and difficulty ambulating secondary to feeling unbalanced on his feet.  This occurred on 07/09/2020.  The symptoms persisted into the 18th, and he presented to the ER.  While in ER he was felt to be in heart failure with shortness of breath with exertion improving with rest and ongoing bilateral lower extremity edema that is chronic.  Fortunately after his knee injection his knee pain improved.  He was admitted to the hospital for BNP of 4150, elevated D-dimer of 592.  Echocardiogram was ordered and was felt to represent new wall motion abnormalities with an EF of 40 to 45% and he was admitted to the hospital for heart failure exacerbation.  Had minimal response to initial dose of Lasix.  Repeat Lasix dose was given and he had 1 L of urine output with improvement.  His troponins were negative x3.  CTA for PE was performed which showed no pulmonary embolism or acute pulmonary process.  A small left pleural effusion was noted.  Home medications were continued.  He was intended to follow-up with his PCP in 1 to 2 weeks for volume status and labs and was intended to follow-up with cardiology in 1 month.  Unclear if this is the urgent cardiac follow-up requested.  Rackley is concerned because of reported low EF (this may be a change from last echo in our system), recent chest pain, and DOE.   The patient notes that he still has some shortness of breath with moving around his house.  And occasionally has chest pain.  This is unchanged from his visit with Dr. Percival Spanish previously.  Shared decision making at that time was in favor of medical management and not performing repeat stress testing.  His chest pain was felt to be atypical and he had a low risk [facility] in 2019.  The chest pain was assumed to be angina and Imdur was started 30 mg daily.  Patient notes that this was of modest benefit,  as he continued to have chest pain afterward.  We discussed uptitrating medication therapy, versus performing ischemic evaluation.   Past Medical History:  Diagnosis Date  . Aortic stenosis    mild AS 09/2017 echo  . Cancer ([place])    skin  . Coronary artery disease    a.  s/p CABG;   b. cath 4/12: EF 55%, 3vCAD, patent L-LAD, patent S-RCA, patent S-CFX (done after a false pos. ETT)  . Diverticular disease   . GERD (gastroesophageal reflux disease)   . GI bleed   . Hemorrhoids   . HH (hiatus hernia)   . History of kidney stones   . Hypertension   . Osteoarthritis   . Other and unspecified hyperlipidemia   . Presence of permanent cardiac pacemaker   . Schatzki's ring   . Stroke [facility])     Past Surgical History:  Procedure Laterality Date  . ARTERIOVENOUS GRAFT PLACEMENT W/ ENDOSCOPIC VEIN HARVEST     of the right leg greater spahenous vein. Surgeon: Tharon Aquas Trigt,M.D.  . COLONOSCOPY  02/24/2010   Hemorrhoids, Diverticulosis. Performed at [place]. Normal terminal ileum. Dr. June Leap, [place] GRAFT  06/21/2007   CABG x 3 Surgeon Ivin Poot, MD  . EYE SURGERY     bilateral cataract removal  . hip replace  06/09/2004   left hip Surgeon Pietro Cassis. Alvan Dame, [place] IMPLANT N/A 10/03/2017   Procedure: PACEMAKER IMPLANT;  Surgeon: Evans Lance, MD;  Location: [place] [facility];  Service: Cardiovascular;  Laterality: N/A;  . REVERSE SHOULDER ARTHROPLASTY Right 08/25/2018   Procedure: REVERSE SHOULDER ARTHROPLASTY;  Surgeon: Netta Cedars, MD;  Location: [place];  Service: Orthopedics;  Laterality: Right;    Current Medications: Current Meds  Medication Sig  . acetaminophen (TYLENOL) 500 MG tablet Take 1,000 mg by mouth 3 (three) times daily.   Marland Kitchen amLODipine (NORVASC) 2.5 MG tablet Take 2.5 mg by mouth daily.  Marland Kitchen aspirin 81 MG tablet Take 1 tablet (81 mg total) by mouth daily.  Marland Kitchen atorvastatin (LIPITOR) 10 MG tablet Take 10 mg by mouth daily.   .  Cyanocobalamin 1000 MCG/ML KIT Inject 1,000 mcg as directed every 30 (thirty) days.  . enalapril (VASOTEC) 20 MG tablet Take 20 mg by mouth 2 (two) times daily.   . furosemide (LASIX) 40 MG tablet Take 40 mg by mouth daily.  . isosorbide mononitrate (IMDUR) 30 MG 24 hr tablet Take 1 tablet (30 mg total) by mouth daily.  Marland Kitchen lidocaine (LIDODERM) 5 % Place 1 patch onto the skin as needed for pain.  . metoprolol succinate (TOPROL-XL) 25 MG 24 hr tablet Take 25 mg by mouth daily.  . nitroGLYCERIN (NITROSTAT) 0.4 MG SL tablet Place 1 tablet under the tongue  as needed for chest pain.  Marland Kitchen omeprazole (PRILOSEC) 20 MG capsule Take 20 mg by mouth daily.   . rivastigmine (EXELON) 1.5 MG capsule Take 1.5 mg by mouth 2 (two) times daily.  . tamsulosin (FLOMAX) 0.4 MG CAPS capsule Take 0.4 mg by mouth daily.  Marland Kitchen triamcinolone cream (KENALOG) 0.5 % Apply 1 application topically daily as needed (skin sores).     Allergies:   Patient has no known allergies.   Social History   Tobacco Use  . Smoking status: Never Smoker  . Smokeless tobacco: Never Used  Vaping Use  . Vaping Use: Never used  Substance Use Topics  . Alcohol use: No  . Drug use: No     Family History: The patient's family history includes Diabetes in his brother, father, and sister; Heart attack in his mother; Hypertension in his mother.  ROS:   Please see the history of present illness.    All other systems reviewed and are negative.  EKGs/Labs/Other Studies Reviewed:    The following studies were reviewed today:  EKG:  Paced rhythm dual AV paced.  Recent Labs: No results found for requested labs within last 8760 hours.  Recent Lipid Panel    Component Value Date/Time   CHOL 112 10/01/2017 0619   TRIG 68 10/01/2017 0619   HDL 39 (L) 10/01/2017 0619   CHOLHDL 2.9 10/01/2017 0619   VLDL 14 10/01/2017 0619   LDLCALC 59 10/01/2017 0619    Physical Exam:    VS:  BP (!) 154/59 (BP Location: Left Arm, Patient Position:  Sitting)   Pulse 61   Ht 5\' 9"  (1.753 m)   Wt 170 lb 3.2 oz (77.2 kg)   SpO2 97%   BMI 25.13 kg/m     Wt Readings from Last 5 Encounters:  07/14/20 170 lb 3.2 oz (77.2 kg)  05/06/20 173 lb (78.5 kg)  12/13/19 169 lb 6.4 oz (76.8 kg)  11/06/19 171 lb 6.4 oz (77.7 kg)  12/12/18 167 lb (75.8 kg)    Constitutional: No acute distress Cardiovascular: regular rhythm, normal rate, 2/6 mid peaking SEM. S1 and S2 normal. Radial pulses normal bilaterally. No jugular venous distention.  Respiratory: clear to auscultation bilaterally GI : normal bowel sounds, soft and nontender. No distention.   MSK: extremities warm, well perfused.  Mild bilateral pedal edema.  NEURO: grossly nonfocal exam, moves all extremities. PSYCH: alert and oriented x 3, normal mood and affect.   ASSESSMENT:    1. Chronic systolic heart failure (HCC)   2. Chest pain of uncertain etiology   3. Pacemaker   4. CHB (complete heart block) (HCC)   5. Essential hypertension   6. Nonrheumatic aortic valve stenosis    PLAN:    We discussed stress testing vs observation of symptoms in detail today, decided on lexiscan myoview prior to visit with Dr. 12/14/18 12/15.  We determined that given the findings on his echocardiogram at the outside facility, as well as continued chest pain and recent findings of heart failure exacerbation, a reassessment of ejection fraction and perfusion would be warranted.  I have explained that I am unable to see the images from the echocardiogram, and variability between interpreting cardiologist may account for the differences altogether (it is unclear whether this truly represents a drop in EF and new regionals).  We will get a quantitative EF on the nuclear stress test and will discuss from there.  He can continue the remainder of his medical therapy which has been reviewed  in detail today.  I have given him precautions to take nitroglycerin as well as present to the ER again.  Total time of  encounter: 50 minutes total time of encounter, including 38 minutes spent in face-to-face patient care on the date of this encounter. This time includes coordination of care and counseling regarding above mentioned problem list. Remainder of non-face-to-face time involved reviewing chart documents/testing relevant to the patient encounter and documentation in the medical record. I have independently reviewed documentation from referring provider.   Cherlynn Kaiser, MD [place]  [facility]    Medication Adjustments/Labs and Tests Ordered: Current medicines are reviewed at length with the patient today.  Concerns regarding medicines are outlined above.   Orders Placed This Encounter  Procedures  . Cardiac Stress Test: Informed Consent Details: Physician/Practitioner Attestation; Transcribe to consent form and obtain patient signature  . MYOCARDIAL PERFUSION IMAGING  . EKG 12-Lead    Shared Decision Making/Informed Consent:   Shared Decision Making/Informed Consent The risks [chest pain, shortness of breath, cardiac arrhythmias, dizziness, blood pressure fluctuations, myocardial infarction, stroke/transient ischemic attack, nausea, vomiting, allergic reaction, radiation exposure, metallic taste sensation and life-threatening complications (estimated to be 1 in 10,000)], benefits (risk stratification, diagnosing coronary artery disease, treatment guidance) and alternatives of a nuclear stress test were discussed in detail with Mr. Aspinall and he agrees to proceed.   No orders of the defined types were placed in this encounter.   Patient Instructions  Medication Instructions:  No Changes In Medications at this time.  *If you need a refill on your cardiac medications before your next appointment, please call your pharmacy*  Lab Work: None Ordered At This Time.  If you have labs (blood work) drawn today and your tests are completely normal, you will receive your results only  by: Marland Kitchen MyChart Message (if you have MyChart) OR . A paper copy in the mail If you have any lab test that is abnormal or we need to change your treatment, we will call you to review the results.  Testing/Procedures: Your physician has requested that you have a lexiscan myoview. For further information please visit [email or web address]. Please follow instruction sheet, as given.   The test will take approximately 3 to 4 hours to complete; you may bring reading material.  If someone comes with you to your appointment, they will need to remain in the main lobby due to limited space in the testing area.    How to prepare for your Myocardial Perfusion Test:  Do not eat or drink 3 hours prior to your test, except you may have water.  Do not consume products containing caffeine (regular or decaffeinated) 12 hours prior to your test. (ex: coffee, chocolate, sodas, tea).  Do wear comfortable clothes (no dresses or overalls) and walking shoes, tennis shoes preferred (No heels or open toe shoes are allowed).  Do NOT wear cologne, perfume, aftershave, or lotions (deodorant is allowed).  If you use an inhaler, use it the AM of your test and bring it with you.   If you use a nebulizer, use it the AM of your test.   If these instructions are not followed, your test will have to be rescheduled.  Follow-Up: At [facility], you and your health needs are our priority.  As part of our continuing mission to provide you with exceptional heart care, we have created designated Provider Care Teams.  These Care Teams include your primary Cardiologist (physician) and Advanced Practice Providers (APPs -  Physician Assistants  and Nurse Practitioners) who all work together to provide you with the care you need, when you need it.  We recommend signing up for the patient portal called "MyChart".  Sign up information is provided on this After Visit Summary.  MyChart is used to connect with patients for Virtual Visits  (Telemedicine).  Patients are able to view lab/test results, encounter notes, upcoming appointments, etc.  Non-urgent messages can be sent to your provider as well.   To learn more about what you can do with MyChart, go to [email or web address].    Your next appointment:   KEEP APPOINTMENT WITH DR. [facility] 08/06/20  The format for your next appointment:   In Person  Provider:   Minus Breeding, MD

## 2020-07-14 NOTE — Telephone Encounter (Signed)
Spoke with pt, aware of appointment today at 10:40 am with dr Margaretann Loveless

## 2020-07-14 NOTE — Telephone Encounter (Signed)
Blake Burgess is calling back stating she received a call this morning from our office stating a lady offered at 10-10:30am appt this morning, but I am unsure who the patient was to be scheduled with. Please advise.

## 2020-07-16 ENCOUNTER — Ambulatory Visit (INDEPENDENT_AMBULATORY_CARE_PROVIDER_SITE_OTHER): Payer: Medicare HMO

## 2020-07-16 DIAGNOSIS — I442 Atrioventricular block, complete: Secondary | ICD-10-CM

## 2020-07-16 LAB — CUP PACEART REMOTE DEVICE CHECK
Battery Remaining Longevity: 91 mo
Battery Voltage: 2.98 V
Brady Statistic AP VP Percent: 18.95 %
Brady Statistic AP VS Percent: 0 %
Brady Statistic AS VP Percent: 77.55 %
Brady Statistic AS VS Percent: 3.5 %
Brady Statistic RA Percent Paced: 20.93 %
Brady Statistic RV Percent Paced: 96.49 %
Date Time Interrogation Session: 20211124002029
Implantable Lead Implant Date: 20190211
Implantable Lead Implant Date: 20190211
Implantable Lead Location: 753859
Implantable Lead Location: 753860
Implantable Lead Model: 3830
Implantable Lead Model: 5076
Implantable Pulse Generator Implant Date: 20190211
Lead Channel Impedance Value: 247 Ohm
Lead Channel Impedance Value: 304 Ohm
Lead Channel Impedance Value: 361 Ohm
Lead Channel Impedance Value: 456 Ohm
Lead Channel Pacing Threshold Amplitude: 0.75 V
Lead Channel Pacing Threshold Amplitude: 2 V
Lead Channel Pacing Threshold Pulse Width: 0.4 ms
Lead Channel Pacing Threshold Pulse Width: 0.4 ms
Lead Channel Sensing Intrinsic Amplitude: 2.125 mV
Lead Channel Sensing Intrinsic Amplitude: 2.125 mV
Lead Channel Sensing Intrinsic Amplitude: 2.375 mV
Lead Channel Sensing Intrinsic Amplitude: 2.375 mV
Lead Channel Setting Pacing Amplitude: 1.5 V
Lead Channel Setting Pacing Amplitude: 2.5 V
Lead Channel Setting Pacing Pulse Width: 0.4 ms
Lead Channel Setting Sensing Sensitivity: 0.6 mV

## 2020-07-23 ENCOUNTER — Encounter (HOSPITAL_COMMUNITY): Payer: Medicare HMO

## 2020-07-24 ENCOUNTER — Telehealth (HOSPITAL_COMMUNITY): Payer: Self-pay

## 2020-07-24 NOTE — Telephone Encounter (Signed)
Spoke with the patient's family member. She wrote everything down. She stated that he would be here for his test. Asked to call back with any questions. S.Kaleena Corrow EMTP

## 2020-07-25 NOTE — Progress Notes (Signed)
Remote pacemaker transmission.   

## 2020-07-29 ENCOUNTER — Ambulatory Visit (HOSPITAL_COMMUNITY): Payer: Medicare HMO | Attending: Internal Medicine

## 2020-07-29 ENCOUNTER — Other Ambulatory Visit: Payer: Self-pay

## 2020-07-29 DIAGNOSIS — I5022 Chronic systolic (congestive) heart failure: Secondary | ICD-10-CM | POA: Insufficient documentation

## 2020-07-29 MED ORDER — REGADENOSON 0.4 MG/5ML IV SOLN
0.4000 mg | Freq: Once | INTRAVENOUS | Status: AC
  Administered 2020-07-29: 0.4 mg via INTRAVENOUS

## 2020-07-29 MED ORDER — TECHNETIUM TC 99M TETROFOSMIN IV KIT
10.8000 | PACK | Freq: Once | INTRAVENOUS | Status: AC | PRN
  Administered 2020-07-29: 10.8 via INTRAVENOUS
  Filled 2020-07-29: qty 11

## 2020-07-29 MED ORDER — TECHNETIUM TC 99M TETROFOSMIN IV KIT
30.9000 | PACK | Freq: Once | INTRAVENOUS | Status: AC | PRN
  Administered 2020-07-29: 30.9 via INTRAVENOUS
  Filled 2020-07-29: qty 31

## 2020-07-30 LAB — MYOCARDIAL PERFUSION IMAGING
LV dias vol: 123 mL (ref 62–150)
LV sys vol: 76 mL
Peak HR: 88 {beats}/min
Rest HR: 75 {beats}/min
SDS: 0
SRS: 4
SSS: 4
TID: 0.95

## 2020-08-04 NOTE — Progress Notes (Signed)
Cardiology Office Note   Date:  08/06/2020   ID:  Blake Burgess, DOB 1932/07/11, MRN [id number]  PCP:  Raelene Bott, MD  Cardiologist:   Minus Breeding, MD   Chief Complaint  Patient presents with  . Chest Pain      History of Present Illness: Blake Burgess is a 86 y.o. male who presents for follow up of CAD and CABG. In 2012 he did have an abnormal stress test followed by catheterization which demonstrated patent bypass grafts. His last stress in 2017 was unremarkable. In Feb 2019 he had chest pain and SOB and came to the ED and was noted to be in CHB. He had a pacemaker placed. During that admission he had positive enzymes so after an office visit a stress test was ordered. There was mild ischemia in the apex with an EF of 42%. This was low risk so he was managed medically.  He saw Dr. Margaretann Loveless as an add on recently for evaluation of chest pain.  He was in [facility] in mid Nov and was felt to have an acute exacerbation of HF.   EF on echo was 45% and CT was negative for PE.  Enzymes were negative.  He was treated with IV Lasix.  He was sent for a perfusion study.  There was no evidence of ischemia.      He thinks his discomfort is getting better.  Still sporadic.  It does not happen every day.  He gets some dull discomfort under his chest.  It is moderately severe but only lasts for seconds.  He might get short of breath with it.  He is not describing jaw or arm discomfort.  He does not describe nausea vomiting or diaphoresis.  He walks slowly with a cane because his knees are bad.  He cannot bring this on.  He has not had any nitroglycerin recently.  He is not having any palpitations, presyncope or syncope.  He denies any PND or orthopnea.  Past Medical History:  Diagnosis Date  . Aortic stenosis    mild AS 09/2017 echo  . Cancer ([place])    skin  . Coronary artery disease    a.  s/p CABG;   b. cath 4/12: EF 55%, 3vCAD, patent L-LAD, patent S-RCA, patent S-CFX (done  after a false pos. ETT)  . Diverticular disease   . GERD (gastroesophageal reflux disease)   . GI bleed   . Hemorrhoids   . HH (hiatus hernia)   . History of kidney stones   . Hypertension   . Osteoarthritis   . Other and unspecified hyperlipidemia   . Presence of permanent cardiac pacemaker   . Schatzki's ring   . Stroke [facility])     Past Surgical History:  Procedure Laterality Date  . ARTERIOVENOUS GRAFT PLACEMENT W/ ENDOSCOPIC VEIN HARVEST     of the right leg greater spahenous vein. Surgeon: Tharon Aquas Trigt,M.D.  . COLONOSCOPY  02/24/2010   Hemorrhoids, Diverticulosis. Performed at [place]. Normal terminal ileum. Dr. June Leap, [place] GRAFT  06/21/2007   CABG x 3 Surgeon Ivin Poot, MD  . EYE SURGERY     bilateral cataract removal  . hip replace  06/09/2004   left hip Surgeon Pietro Cassis. Alvan Dame, [place] IMPLANT N/A 10/03/2017   Procedure: PACEMAKER IMPLANT;  Surgeon: Evans Lance, MD;  Location: [place] [facility];  Service: Cardiovascular;  Laterality: N/A;  . REVERSE SHOULDER  ARTHROPLASTY Right 08/25/2018   Procedure: REVERSE SHOULDER ARTHROPLASTY;  Surgeon: Netta Cedars, MD;  Location: [place];  Service: Orthopedics;  Laterality: Right;     Current Outpatient Medications  Medication Sig Dispense Refill  . acetaminophen (TYLENOL) 500 MG tablet Take 1,000 mg by mouth 3 (three) times daily.    Marland Kitchen amLODipine (NORVASC) 2.5 MG tablet Take 2.5 mg by mouth daily.    Marland Kitchen aspirin 81 MG tablet Take 1 tablet (81 mg total) by mouth daily. 30 tablet   . atorvastatin (LIPITOR) 10 MG tablet Take 10 mg by mouth daily.     . Cyanocobalamin 1000 MCG/ML KIT Inject 1,000 mcg as directed every 30 (thirty) days.    . enalapril (VASOTEC) 20 MG tablet Take 20 mg by mouth 2 (two) times daily.     . furosemide (LASIX) 40 MG tablet Take 40 mg by mouth daily.    Marland Kitchen lidocaine (LIDODERM) 5 % Place 1 patch onto the skin as needed for pain.    . metoprolol succinate  (TOPROL-XL) 25 MG 24 hr tablet Take 25 mg by mouth daily.    . nitroGLYCERIN (NITROSTAT) 0.4 MG SL tablet Place 1 tablet under the tongue as needed for chest pain.    Marland Kitchen omeprazole (PRILOSEC) 20 MG capsule Take 20 mg by mouth daily.     . rivastigmine (EXELON) 1.5 MG capsule Take 1.5 mg by mouth 2 (two) times daily.    . tamsulosin (FLOMAX) 0.4 MG CAPS capsule Take 0.4 mg by mouth daily.    Marland Kitchen triamcinolone cream (KENALOG) 0.5 % Apply 1 application topically daily as needed (skin sores).    . isosorbide mononitrate (IMDUR) 60 MG 24 hr tablet Take 1 tablet (60 mg total) by mouth daily. 90 tablet 3   No current facility-administered medications for this visit.    Allergies:   Patient has no known allergies.    ROS:  Please see the history of present illness.   Otherwise, review of systems are positive for none.   All other systems are reviewed and negative.    PHYSICAL EXAM: VS:  BP 130/68   Pulse 76   Temp (!) 97 F (36.1 C)   Ht _0  (1.753 m)   Wt 176 lb (79.8 kg)   SpO2 95%   BMI 25.99 kg/m  , BMI Body mass index is 25.99 kg/m.  GENERAL:  Well appearing NECK:  No jugular venous distention, waveform within normal limits, carotid upstroke brisk and symmetric, no bruits, no thyromegaly LUNGS:  Clear to auscultation bilaterally CHEST: Mildly decreased bilateral breath sounds HEART:  PMI not displaced or sustained,S1 and S2 within normal limits, no S3, no S4, no clicks, no rubs, 2/6 apical systolic murmur, no diastolic murmurs ABD:  Flat, positive bowel sounds normal in frequency in pitch, no bruits, no rebound, no guarding, no midline pulsatile mass, no hepatomegaly, no splenomegaly EXT:  2 plus pulses throughout, no edema, no cyanosis no clubbing   EKG:  EKG is not ordered today. NA   Recent Labs: No results found for requested labs within last 8760 hours.    Lipid Panel    Component Value Date/Time   CHOL 112 10/01/2017 0619   TRIG 68 10/01/2017 0619   HDL 39 (L)  10/01/2017 0619   CHOLHDL 2.9 10/01/2017 0619   VLDL 14 10/01/2017 0619   LDLCALC 59 10/01/2017 0619      Wt Readings from Last 3 Encounters:  08/06/20 176 lb (79.8 kg)  07/29/20 170 lb (  77.1 kg)  07/14/20 170 lb 3.2 oz (77.2 kg)      Other studies Reviewed: Additional studies/ records that were reviewed today include: Discussion in the phone with Dr Lovena Le. Review of the above records demonstrates:  Please see elsewhere in the note.     ASSESSMENT AND PLAN:   CHB/PACEMAKER PLACEMENT:   I called Dr. Lovena Le that had him review the strips which mentioned atrial fibrillation from his last device interrogation.  He is not convinced this was fibrillation.  Regardless the longest episode was 30 minutes and he would be at higher risk for anticoagulation so I had this conversation with the patient and his son and he will continue on aspirin only.   CHEST PAIN:    The chest pain is atypical but I am going to increase his Imdur to 60 mg daily.  He had no high risk findings on his stress test.  He would let me know if he has any increasing symptoms.   HTN: His blood pressure is controlled.  No change in therapy.   CHRONIC SYSTOLIC AND DIASTOLIC HF:      He seems to be euvolemic.  No change in therapy.   AS: He had mild AS on echo in [place].  I reviewed these notes and this result for this appt. this was mild on echo in the past I will follow this up clinically.   Current medicines are reviewed at length with the patient today.  The patient does not have concerns regarding medicines.  The following changes have been made:  As above.   Labs/ tests ordered today include: None No orders of the defined types were placed in this encounter.    Disposition:   FU with me in six months.     Signed, Minus Breeding, MD  08/06/2020 10:58 AM    [facility]

## 2020-08-06 ENCOUNTER — Other Ambulatory Visit: Payer: Self-pay

## 2020-08-06 ENCOUNTER — Ambulatory Visit: Payer: Medicare HMO | Admitting: Cardiology

## 2020-08-06 ENCOUNTER — Encounter: Payer: Self-pay | Admitting: Cardiology

## 2020-08-06 VITALS — BP 130/68 | HR 76 | Temp 97.0°F | Ht 69.0 in | Wt 176.0 lb

## 2020-08-06 DIAGNOSIS — I350 Nonrheumatic aortic (valve) stenosis: Secondary | ICD-10-CM | POA: Diagnosis not present

## 2020-08-06 DIAGNOSIS — I10 Essential (primary) hypertension: Secondary | ICD-10-CM

## 2020-08-06 DIAGNOSIS — I5042 Chronic combined systolic (congestive) and diastolic (congestive) heart failure: Secondary | ICD-10-CM

## 2020-08-06 MED ORDER — ISOSORBIDE MONONITRATE ER 60 MG PO TB24: 60.0000 mg | ORAL_TABLET | Freq: Every day | ORAL | 3 refills | Status: DC

## 2020-08-06 NOTE — Patient Instructions (Signed)
Medication Instructions:  INCREASE IMDUR TO 60MG  DAILY *If you need a refill on your cardiac medications before your next appointment, please call your pharmacy*  Lab Work: NONE ORDERED THIS VISIT  Testing/Procedures: NONE ORDERED THIS VISIT  Follow-Up: At [facility], you and your health needs are our priority.  As part of our continuing mission to provide you with exceptional heart care, we have created designated Provider Care Teams.  These Care Teams include your primary Cardiologist (physician) and Advanced Practice Providers (APPs -  Physician Assistants and Nurse Practitioners) who all work together to provide you with the care you need, when you need it.   Your next appointment:   6 month(s)  You will receive a reminder letter in the mail two months in advance. If you don't receive a letter, please call our office to schedule the follow-up appointment.  The format for your next appointment:   In Person  Provider:   Minus Breeding, MD

## 2020-10-15 ENCOUNTER — Ambulatory Visit (INDEPENDENT_AMBULATORY_CARE_PROVIDER_SITE_OTHER): Payer: Medicare HMO

## 2020-10-15 DIAGNOSIS — I442 Atrioventricular block, complete: Secondary | ICD-10-CM | POA: Diagnosis not present

## 2020-10-16 ENCOUNTER — Telehealth: Payer: Self-pay | Admitting: Emergency Medicine

## 2020-10-16 LAB — CUP PACEART REMOTE DEVICE CHECK
Battery Remaining Longevity: 87 mo
Battery Voltage: 2.97 V
Brady Statistic AP VP Percent: 26.84 %
Brady Statistic AP VS Percent: 0 %
Brady Statistic AS VP Percent: 65.98 %
Brady Statistic AS VS Percent: 7.18 %
Brady Statistic RA Percent Paced: 30.33 %
Brady Statistic RV Percent Paced: 92.82 %
Date Time Interrogation Session: 20220223002038
Implantable Lead Implant Date: 20190211
Implantable Lead Implant Date: 20190211
Implantable Lead Location: 753859
Implantable Lead Location: 753860
Implantable Lead Model: 3830
Implantable Lead Model: 5076
Implantable Pulse Generator Implant Date: 20190211
Lead Channel Impedance Value: 228 Ohm
Lead Channel Impedance Value: 285 Ohm
Lead Channel Impedance Value: 380 Ohm
Lead Channel Impedance Value: 456 Ohm
Lead Channel Pacing Threshold Amplitude: 0.75 V
Lead Channel Pacing Threshold Amplitude: 2.5 V
Lead Channel Pacing Threshold Pulse Width: 0.4 ms
Lead Channel Pacing Threshold Pulse Width: 0.4 ms
Lead Channel Sensing Intrinsic Amplitude: 1.875 mV
Lead Channel Sensing Intrinsic Amplitude: 1.875 mV
Lead Channel Sensing Intrinsic Amplitude: 2.375 mV
Lead Channel Sensing Intrinsic Amplitude: 2.375 mV
Lead Channel Setting Pacing Amplitude: 1.5 V
Lead Channel Setting Pacing Amplitude: 2.5 V
Lead Channel Setting Pacing Pulse Width: 0.4 ms
Lead Channel Setting Sensing Sensitivity: 0.6 mV

## 2020-10-16 NOTE — Telephone Encounter (Signed)
  Carelink alert received There was one short NSVT arrhythmia detected.  There were three atrial arrhythmias detected. The longest was 34 minutes. This atrial episode was longer than past transmissions. Patient is not currently on any OAC's at this time. Sending information to Dr.Taylor for review.

## 2020-10-21 NOTE — Telephone Encounter (Signed)
Atrial rates below 200 ms are atrial fib and need to be sent to atrial fib clinic to start eliquis unless a h/o bleeding or frequent falls. GT

## 2020-10-22 NOTE — Progress Notes (Signed)
Remote pacemaker transmission.   

## 2020-10-23 ENCOUNTER — Telehealth: Payer: Self-pay | Admitting: Emergency Medicine

## 2020-10-23 ENCOUNTER — Inpatient Hospital Stay: Admission: AD | Admit: 2020-10-23 | Payer: Medicare HMO | Source: Other Acute Inpatient Hospital | Admitting: Cardiology

## 2020-10-23 NOTE — Telephone Encounter (Signed)
error 

## 2020-10-23 NOTE — Telephone Encounter (Signed)
Called to assess patient and to discuss A-fib Clinic with patient. Spoke with patient's son [name]). Patient's son advised that patient was taken to [facility] by EMS today on 10/23/2020 and diagnosed as having a mild heart attack. Per patient's son, patient is going to be transferred to [facility] as soon as there is availability at the facility.   Per Dr. Lovena Le, patient to be referred to [facility] from [place] received on 10/16/2020. A-fib episode lasting 34 minutes. Patient was not on an [place] at this time.

## 2020-10-23 NOTE — Telephone Encounter (Signed)
Patient information sent to A-fib clinic.

## 2020-10-24 ENCOUNTER — Encounter (HOSPITAL_COMMUNITY): Admission: RE | Payer: Self-pay | Source: Home / Self Care

## 2020-10-24 ENCOUNTER — Ambulatory Visit (HOSPITAL_COMMUNITY): Admission: RE | Admit: 2020-10-24 | Payer: Medicare HMO | Source: Home / Self Care | Admitting: Cardiovascular Disease

## 2020-10-24 SURGERY — LEFT HEART CATH AND CORONARY ANGIOGRAPHY
Anesthesia: LOCAL

## 2020-10-27 NOTE — Telephone Encounter (Signed)
Pt to see Dr. Percival Spanish on 3/11 for hospital follow up for small heart attack per [facility].  Pt to follow up there afib clinic if needed thereafter.

## 2020-10-29 NOTE — Progress Notes (Signed)
Cardiology Office Note   Date:  11/02/2020   ID:  Blake Burgess, DOB 1932/05/04, MRN [id number]  PCP:  Raelene Bott, MD  Cardiologist:   Minus Breeding, MD   Chief Complaint  Patient presents with  . Chest Pain  . Atrial Fibrillation      History of Present Illness: Blake Burgess is a 87 y.o. male who presents for follow up of CAD and CABG. In 2012 he did have an abnormal stress test followed by catheterization which demonstrated patent bypass grafts. His last stress in 2017 was unremarkable. In Feb 2019 he had chest pain and SOB and came to the ED and was noted to be in CHB. He had a pacemaker placed. During that admission he had positive enzymes so after an office visit a stress test was ordered. There was mild ischemia in the apex with an EF of 42%. This was low risk so he was managed medically.  He saw Dr. Margaretann Loveless as an add on recently for evaluation of chest pain.  He was in [facility] in mid Nov and was felt to have an acute exacerbation of HF.   EF on echo was 45% and CT was negative for PE.  Enzymes were negative.  He was treated with IV Lasix.  He was sent for a perfusion study.  There was no evidence of ischemia.   He was hospitalized at [facility] recently with chest pain.  I did review records and I see that his trop was mildly elevated.  It was around 0.277 when I looked at the records.  He had some volume overload and was treated with diuresis.  They wanted to transfer him to [facility] but there were no beds.  He was sent home and told to come here.  He reports chest pain off an on for days.  He cannot quantify or qualify this.  It happens at rest.  He says that he takes NTG rarely.  He has been doing lots of chores for his wife and doesn't bring this on necessarily with that activity.  He does not have resting shortness of breath, PND or orthopnea.  He was told recently from a monitor on the 24th he had rapid rates consistent with atrial fibrillation.  Dr. Lovena Le looked  at this.  He had 34 minutes of atrial fibrillation and was to be started on Eliquis.  When EP called him about this he was in the hospital and the Eliquis was not started.  He was not sent home on this.  He does not feel the palpitations apparently.       Past Medical History:  Diagnosis Date  . Aortic stenosis    mild AS 09/2017 echo  . Cancer ([place])    skin  . Coronary artery disease    a.  s/p CABG;   b. cath 4/12: EF 55%, 3vCAD, patent L-LAD, patent S-RCA, patent S-CFX (done after a false pos. ETT)  . Diverticular disease   . GERD (gastroesophageal reflux disease)   . GI bleed   . Hemorrhoids   . HH (hiatus hernia)   . History of kidney stones   . Hypertension   . Osteoarthritis   . Other and unspecified hyperlipidemia   . Presence of permanent cardiac pacemaker   . Schatzki's ring   . Stroke [facility])     Past Surgical History:  Procedure Laterality Date  . ARTERIOVENOUS GRAFT PLACEMENT W/ ENDOSCOPIC VEIN HARVEST     of the right  leg greater spahenous vein. Surgeon: Tharon Aquas Trigt,M.D.  . COLONOSCOPY  02/24/2010   Hemorrhoids, Diverticulosis. Performed at [place]. Normal terminal ileum. Dr. June Leap, [place] GRAFT  06/21/2007   CABG x 3 Surgeon Ivin Poot, MD  . EYE SURGERY     bilateral cataract removal  . hip replace  06/09/2004   left hip Surgeon Pietro Cassis. Alvan Dame, [place] IMPLANT N/A 10/03/2017   Procedure: PACEMAKER IMPLANT;  Surgeon: Evans Lance, MD;  Location: [place] CV LAB;  Service: Cardiovascular;  Laterality: N/A;  . REVERSE SHOULDER ARTHROPLASTY Right 08/25/2018   Procedure: REVERSE SHOULDER ARTHROPLASTY;  Surgeon: Netta Cedars, MD;  Location: [place];  Service: Orthopedics;  Laterality: Right;     Current Outpatient Medications  Medication Sig Dispense Refill  . acetaminophen (TYLENOL) 500 MG tablet Take 1,000 mg by mouth 3 (three) times daily.    [place] Kitchen amLODipine (NORVASC) 2.5 MG tablet Take 2.5 mg by mouth daily.    Marland Kitchen  aspirin 81 MG tablet Take 1 tablet (81 mg total) by mouth daily. 30 tablet   . atorvastatin (LIPITOR) 40 MG tablet Take 40 mg by mouth daily.    . Cyanocobalamin 1000 MCG/ML KIT Inject 1,000 mcg as directed every 30 (thirty) days.    . enalapril (VASOTEC) 20 MG tablet Take 20 mg by mouth 2 (two) times daily.     . isosorbide mononitrate (IMDUR) 30 MG 24 hr tablet Take by mouth.    . lidocaine (LIDODERM) 5 % Place 1 patch onto the skin as needed for pain.    . metoprolol succinate (TOPROL-XL) 25 MG 24 hr tablet Take 25 mg by mouth daily.    . nitroGLYCERIN (NITROSTAT) 0.4 MG SL tablet Place 1 tablet under the tongue as needed for chest pain.    Marland Kitchen omeprazole (PRILOSEC) 20 MG capsule Take 20 mg by mouth daily.     . rivastigmine (EXELON) 1.5 MG capsule Take 1.5 mg by mouth 2 (two) times daily.    . tamsulosin (FLOMAX) 0.4 MG CAPS capsule Take 0.4 mg by mouth daily.    Marland Kitchen triamcinolone cream (KENALOG) 0.5 % Apply 1 application topically daily as needed (skin sores).    . furosemide (LASIX) 40 MG tablet Take 40 mg by mouth daily. (Patient not taking: Reported on 10/31/2020)     No current facility-administered medications for this visit.    Allergies:   Patient has no active allergies.    ROS:  Please see the history of present illness.   Otherwise, review of systems are positive for none.   All other systems are reviewed and negative.    PHYSICAL EXAM: VS:  BP (!) 158/60   Pulse 64   Ht 5' 9"  (1.753 m)   Wt 172 lb 12.8 oz (78.4 kg)   SpO2 92%   BMI 25.52 kg/m  , BMI Body mass index is 25.52 kg/m.  GENERAL:  Well appearing NECK:  No jugular venous distention, waveform within normal limits, carotid upstroke brisk and symmetric, no bruits, no thyromegaly LUNGS:  Clear to auscultation bilaterally CHEST:  Well healed sternotomy scar. HEART:  PMI not displaced or sustained,S1 and S2 within normal limits, no S3, no S4, no clicks, no rubs, 3 out of 6 systolic murmur radiating at the aortic  outflow tract, no diastolic murmurs ABD:  Flat, positive bowel sounds normal in frequency in pitch, no bruits, no rebound, no guarding, no midline pulsatile mass, no hepatomegaly, no splenomegaly  EXT:  2 plus pulses mild to moderate bilateral leg swelling.no cyanosis no clubbing   EKG:  EKG is not ordered today. EKG 07/14/20  AV paced rhythm.  (EKG at [facility] was atrial fib by report)   Recent Labs: 10/31/2020: BUN 17; Creatinine, Ser 1.11; Potassium 4.2; Sodium 142    Lipid Panel    Component Value Date/Time   CHOL 112 10/01/2017 0619   TRIG 68 10/01/2017 0619   HDL 39 (L) 10/01/2017 0619   CHOLHDL 2.9 10/01/2017 0619   VLDL 14 10/01/2017 0619   LDLCALC 59 10/01/2017 0619      Wt Readings from Last 3 Encounters:  10/31/20 172 lb 12.8 oz (78.4 kg)  08/06/20 176 lb (79.8 kg)  07/29/20 170 lb (77.1 kg)      Other studies Reviewed: Additional studies/ records that were reviewed today include: Outside records in [place] Review of the above records demonstrates:  Please see elsewhere in the note.     ASSESSMENT AND PLAN:   CHB/PACEMAKER PLACEMENT:    He is up to date.  See below.   ATRIAL FIB:  He seems to have new atrial fib.  I will start on Eliquis after he has had evaluation below.  I am going to increase his metoprolol to 50 mg daily.  CHEST PAIN:  .  His NSTEMI recently might have been secondary to his atrial fib.  However there is still some apparent resting chest pain and with his elevated enzymes cardiac cath is indicated.   The patient understands that risks included but are not limited to stroke (1 in 1000), death (1 in 73), kidney failure [usually temporary] (1 in 500), bleeding (1 in 200), allergic reaction [possibly serious] (1 in 200).  The patient understands and agrees to proceed.   This may be precipitated by his family which I will manage with increased beta-blocker and eventually Eliquis.  HTN: His blood pressure is elevated but I will treat  this with increasing antianginal therapy.   CHRONIC SYSTOLIC AND DIASTOLIC HF:      He seems to be euvolemic.  No change in therapy.   AS: He had mild AS on echo in [place] in 2021.  No change in therapy.   Current medicines are reviewed at length with the patient today.  The patient does not have concerns regarding medicines.  The following changes have been made:  As above  Labs/ tests ordered today include: Cardiac cath  Orders Placed This Encounter  Procedures  . Basic metabolic panel     Disposition:   FU with me in after the cath.  Ronnell Guadalajara, MD  11/02/2020 3:56 PM    [facility]

## 2020-10-29 NOTE — H&P (View-Only) (Signed)
Cardiology Office Note   Date:  11/02/2020   ID:  Blake Burgess, DOB 27-Mar-1932, MRN [id number]  PCP:  Raelene Bott, MD  Cardiologist:   Minus Breeding, MD   Chief Complaint  Patient presents with  . Chest Pain  . Atrial Fibrillation      History of Present Illness: Blake Burgess is a 87 y.o. male who presents for follow up of CAD and CABG. In 2012 he did have an abnormal stress test followed by catheterization which demonstrated patent bypass grafts. His last stress in 2017 was unremarkable. In Feb 2019 he had chest pain and SOB and came to the ED and was noted to be in CHB. He had a pacemaker placed. During that admission he had positive enzymes so after an office visit a stress test was ordered. There was mild ischemia in the apex with an EF of 42%. This was low risk so he was managed medically.  He saw Dr. Margaretann Loveless as an add on recently for evaluation of chest pain.  He was in [facility] in mid Nov and was felt to have an acute exacerbation of HF.   EF on echo was 45% and CT was negative for PE.  Enzymes were negative.  He was treated with IV Lasix.  He was sent for a perfusion study.  There was no evidence of ischemia.   He was hospitalized at [facility] recently with chest pain.  I did review records and I see that his trop was mildly elevated.  It was around 0.277 when I looked at the records.  He had some volume overload and was treated with diuresis.  They wanted to transfer him to [facility] but there were no beds.  He was sent home and told to come here.  He reports chest pain off an on for days.  He cannot quantify or qualify this.  It happens at rest.  He says that he takes NTG rarely.  He has been doing lots of chores for his wife and doesn't bring this on necessarily with that activity.  He does not have resting shortness of breath, PND or orthopnea.  He was told recently from a monitor on the 24th he had rapid rates consistent with atrial fibrillation.  Dr. Lovena Le looked  at this.  He had 34 minutes of atrial fibrillation and was to be started on Eliquis.  When EP called him about this he was in the hospital and the Eliquis was not started.  He was not sent home on this.  He does not feel the palpitations apparently.       Past Medical History:  Diagnosis Date  . Aortic stenosis    mild AS 09/2017 echo  . Cancer ([place])    skin  . Coronary artery disease    a.  s/p CABG;   b. cath 4/12: EF 55%, 3vCAD, patent L-LAD, patent S-RCA, patent S-CFX (done after a false pos. ETT)  . Diverticular disease   . GERD (gastroesophageal reflux disease)   . GI bleed   . Hemorrhoids   . HH (hiatus hernia)   . History of kidney stones   . Hypertension   . Osteoarthritis   . Other and unspecified hyperlipidemia   . Presence of permanent cardiac pacemaker   . Schatzki's ring   . Stroke [facility])     Past Surgical History:  Procedure Laterality Date  . ARTERIOVENOUS GRAFT PLACEMENT W/ ENDOSCOPIC VEIN HARVEST     of the right  leg greater spahenous vein. Surgeon: Tharon Aquas Trigt,M.D.  . COLONOSCOPY  02/24/2010   Hemorrhoids, Diverticulosis. Performed at [place]. Normal terminal ileum. Dr. June Leap, [place] GRAFT  06/21/2007   CABG x 3 Surgeon Ivin Poot, MD  . EYE SURGERY     bilateral cataract removal  . hip replace  06/09/2004   left hip Surgeon Pietro Cassis. Alvan Dame, [place] IMPLANT N/A 10/03/2017   Procedure: PACEMAKER IMPLANT;  Surgeon: Evans Lance, MD;  Location: [place] CV LAB;  Service: Cardiovascular;  Laterality: N/A;  . REVERSE SHOULDER ARTHROPLASTY Right 08/25/2018   Procedure: REVERSE SHOULDER ARTHROPLASTY;  Surgeon: Netta Cedars, MD;  Location: [place];  Service: Orthopedics;  Laterality: Right;     Current Outpatient Medications  Medication Sig Dispense Refill  . acetaminophen (TYLENOL) 500 MG tablet Take 1,000 mg by mouth 3 (three) times daily.    Marland Kitchen amLODipine (NORVASC) 2.5 MG tablet Take 2.5 mg by mouth daily.    Mar[place] Kitchen  aspirin 81 MG tablet Take 1 tablet (81 mg total) by mouth daily. 30 tablet   . atorvastatin (LIPITOR) 40 MG tablet Take 40 mg by mouth daily.    . Cyanocobalamin 1000 MCG/ML KIT Inject 1,000 mcg as directed every 30 (thirty) days.    . enalapril (VASOTEC) 20 MG tablet Take 20 mg by mouth 2 (two) times daily.     . isosorbide mononitrate (IMDUR) 30 MG 24 hr tablet Take by mouth.    . lidocaine (LIDODERM) 5 % Place 1 patch onto the skin as needed for pain.    . metoprolol succinate (TOPROL-XL) 25 MG 24 hr tablet Take 25 mg by mouth daily.    . nitroGLYCERIN (NITROSTAT) 0.4 MG SL tablet Place 1 tablet under the tongue as needed for chest pain.    Mar[place] Kitchen omeprazole (PRILOSEC) 20 MG capsule Take 20 mg by mouth daily.     . rivastigmine (EXELON) 1.5 MG capsule Take 1.5 mg by mouth 2 (two) times daily.    . tamsulosin (FLOMAX) 0.4 MG CAPS capsule Take 0.4 mg by mouth daily.    Mar[place] Kitchen triamcinolone cream (KENALOG) 0.5 % Apply 1 application topically daily as needed (skin sores).    . furosemide (LASIX) 40 MG tablet Take 40 mg by mouth daily. (Patient not taking: Reported on 10/31/2020)     No current facility-administered medications for this visit.    Allergies:   Patient has no active allergies.    ROS:  Please see the history of present illness.   Otherwise, review of systems are positive for none.   All other systems are reviewed and negative.    PHYSICAL EXAM: VS:  BP (!) 158/60   Pulse 64   Ht 5' 9"  (1.753 m)   Wt 172 lb 12.8 oz (78.4 kg)   SpO2 92%   BMI 25.52 kg/m  , BMI Body mass index is 25.52 kg/m.  GENERAL:  Well appearing NECK:  No jugular venous distention, waveform within normal limits, carotid upstroke brisk and symmetric, no bruits, no thyromegaly LUNGS:  Clear to auscultation bilaterally CHEST:  Well healed sternotomy scar. HEART:  PMI not displaced or sustained,S1 and S2 within normal limits, no S3, no S4, no clicks, no rubs, 3 out of 6 systolic murmur radiating at the aortic  outflow tract, no diastolic murmurs ABD:  Flat, positive bowel sounds normal in frequency in pitch, no bruits, no rebound, no guarding, no midline pulsatile mass, no hepatomegaly, no splenomegaly  EXT:  2 plus pulses mild to moderate bilateral leg swelling.no cyanosis no clubbing   EKG:  EKG is not ordered today. EKG 07/14/20  AV paced rhythm.  (EKG at [facility] was atrial fib by report)   Recent Labs: 10/31/2020: BUN 17; Creatinine, Ser 1.11; Potassium 4.2; Sodium 142    Lipid Panel    Component Value Date/Time   CHOL 112 10/01/2017 0619   TRIG 68 10/01/2017 0619   HDL 39 (L) 10/01/2017 0619   CHOLHDL 2.9 10/01/2017 0619   VLDL 14 10/01/2017 0619   LDLCALC 59 10/01/2017 0619      Wt Readings from Last 3 Encounters:  10/31/20 172 lb 12.8 oz (78.4 kg)  08/06/20 176 lb (79.8 kg)  07/29/20 170 lb (77.1 kg)      Other studies Reviewed: Additional studies/ records that were reviewed today include: Outside records in [place] Review of the above records demonstrates:  Please see elsewhere in the note.     ASSESSMENT AND PLAN:   CHB/PACEMAKER PLACEMENT:    He is up to date.  See below.   ATRIAL FIB:  He seems to have new atrial fib.  I will start on Eliquis after he has had evaluation below.  I am going to increase his metoprolol to 50 mg daily.  CHEST PAIN:  .  His NSTEMI recently might have been secondary to his atrial fib.  However there is still some apparent resting chest pain and with his elevated enzymes cardiac cath is indicated.   The patient understands that risks included but are not limited to stroke (1 in 1000), death (1 in 34), kidney failure [usually temporary] (1 in 500), bleeding (1 in 200), allergic reaction [possibly serious] (1 in 200).  The patient understands and agrees to proceed.   This may be precipitated by his family which I will manage with increased beta-blocker and eventually Eliquis.  HTN: His blood pressure is elevated but I will treat  this with increasing antianginal therapy.   CHRONIC SYSTOLIC AND DIASTOLIC HF:      He seems to be euvolemic.  No change in therapy.   AS: He had mild AS on echo in [place] in 2021.  No change in therapy.   Current medicines are reviewed at length with the patient today.  The patient does not have concerns regarding medicines.  The following changes have been made:  As above  Labs/ tests ordered today include: Cardiac cath  Orders Placed This Encounter  Procedures  . Basic metabolic panel     Disposition:   FU with me in after the cath.  Ronnell Guadalajara, MD  11/02/2020 3:56 PM    [facility]

## 2020-10-31 ENCOUNTER — Other Ambulatory Visit (HOSPITAL_COMMUNITY)
Admission: RE | Admit: 2020-10-31 | Discharge: 2020-10-31 | Disposition: A | Discharge status: Home / Self Care | Payer: Medicare HMO | Source: Ambulatory Visit | Attending: Cardiovascular Disease | Admitting: Cardiovascular Disease

## 2020-10-31 ENCOUNTER — Encounter: Payer: Self-pay | Admitting: Cardiology

## 2020-10-31 ENCOUNTER — Other Ambulatory Visit: Payer: Self-pay

## 2020-10-31 ENCOUNTER — Ambulatory Visit: Payer: Medicare HMO | Admitting: Cardiology

## 2020-10-31 VITALS — BP 158/60 | HR 64 | Ht 69.0 in | Wt 172.8 lb

## 2020-10-31 DIAGNOSIS — R072 Precordial pain: Secondary | ICD-10-CM

## 2020-10-31 DIAGNOSIS — Z01812 Encounter for preprocedural laboratory examination: Secondary | ICD-10-CM | POA: Diagnosis present

## 2020-10-31 DIAGNOSIS — Z950 Presence of cardiac pacemaker: Secondary | ICD-10-CM

## 2020-10-31 DIAGNOSIS — I350 Nonrheumatic aortic (valve) stenosis: Secondary | ICD-10-CM | POA: Diagnosis not present

## 2020-10-31 DIAGNOSIS — Z20822 Contact with and (suspected) exposure to covid-19: Secondary | ICD-10-CM | POA: Diagnosis not present

## 2020-10-31 DIAGNOSIS — I5042 Chronic combined systolic (congestive) and diastolic (congestive) heart failure: Secondary | ICD-10-CM

## 2020-10-31 DIAGNOSIS — Z79899 Other long term (current) drug therapy: Secondary | ICD-10-CM

## 2020-10-31 LAB — SARS CORONAVIRUS 2 (TAT 6-24 HRS): SARS Coronavirus 2: NEGATIVE

## 2020-10-31 NOTE — Patient Instructions (Addendum)
  You are scheduled for a Cardiac Catheterization on Tuesday, March 15 with Dr. [name].  1. Please arrive at the [facility] (Main Entrance A) at [facility]: [address] [place], [place] [address] at 8:30 AM (This time is two hours before your procedure to ensure your preparation). Free valet parking service is available.   Special note: Every effort is made to have your procedure done on time. Please understand that emergencies sometimes delay scheduled procedures.  2. Diet: Do not eat solid foods after midnight.  The patient may have clear liquids until 5am upon the day of the procedure.  3. Labs: You will need to have blood drawn on Friday, March 11 at [facility] [place]. Open: 7:30am - 5pm    Phone: [phone]. You do not need to be fasting.  COVID testing site: [facility] [place]. [place], [place] [address] Drive thru hours M-F 8-3:30PM  Sat 9-12:30PM.  4. Medication instructions in preparation for your procedure:  On the morning of your procedure, take your Aspirin and any morning medicines NOT listed above.  You may use sips of water.  5. Plan for one night stay--bring personal belongings. 6. Bring a current list of your medications and current insurance cards. 7. You MUST have a responsible person to drive you home. 8. Someone MUST be with you the first 24 hours after you arrive home or your discharge will be delayed. 9. Please wear clothes that are easy to get on and off and wear slip-on shoes.  Thank you for allowing [place] to care for you!   -- [facility]

## 2020-11-01 LAB — BASIC METABOLIC PANEL
BUN/Creatinine Ratio: 15 (ref 10–24)
BUN: 17 mg/dL (ref 8–27)
CO2: 22 mmol/L (ref 20–29)
Calcium: 8.4 mg/dL — ABNORMAL LOW (ref 8.6–10.2)
Chloride: 105 mmol/L (ref 96–106)
Creatinine, Ser: 1.11 mg/dL (ref 0.76–1.27)
Glucose: 122 mg/dL — ABNORMAL HIGH (ref 65–99)
Potassium: 4.2 mmol/L (ref 3.5–5.2)
Sodium: 142 mmol/L (ref 134–144)
eGFR: 65 mL/min/{1.73_m2} (ref >59)

## 2020-11-02 ENCOUNTER — Encounter: Payer: Self-pay | Admitting: Cardiology

## 2020-11-03 ENCOUNTER — Telehealth: Payer: Self-pay | Admitting: *Deleted

## 2020-11-03 ENCOUNTER — Telehealth: Payer: Self-pay

## 2020-11-03 NOTE — Telephone Encounter (Addendum)
Pt contacted pre-catheterization scheduled at [facility] for: Tuesday November 04, 2020 10:30 AM Verified arrival time and place: [facility]) at: 8:30 AM   No solid food after midnight prior to cath, clear liquids until 5 AM day of procedure.  AM meds can be  taken pre-cath with sips of water including: ASA 81 mg   Confirmed patient has responsible adult to drive home post procedure and be with patient first 24 hours after arriving home: yes  You are allowed ONE visitor in the waiting room during the time you are at the hospital for your procedure. Both you and your visitor must wear a mask once you enter the hospital.   Reviewed procedure/mask/visitor instructions with patient's son, Blake Burgess.  11/03/20 Dr Percival Spanish aware Hgb 9.3 /Platelet count 205 done 10/24/20 in Care Everywhere Per Dr Percival Spanish 11/03/20-okay to proceed with LHC/G 11/04/20 CBC 10/23/20-results in Care Everywhere. Per patient's son-pt did go to [facility] in [place] for CBC around 2 PM today. Per [facility] customer service-Catrice-no CBC results available at this time.

## 2020-11-03 NOTE — Addendum Note (Signed)
Addended by: Veronda Prude L on: 11/03/2020 01:00 PM   Modules accepted: Orders

## 2020-11-03 NOTE — Addendum Note (Signed)
Addended by: Waylan Rocher on: 11/03/2020 12:34 PM   Modules accepted: Orders

## 2020-11-03 NOTE — Telephone Encounter (Signed)
Called and s/w Son (DPR) he states that he will take pt to the lab in [place] now. Son will call whil at the lab if there are any questions. Gave son address and phone number for [facility] Located in: [facility];  Address: [address], [place], [place] [address] Hours: Closes 4PM Phone: [phone].   Faxed [facility] lab slip to [phone]. So lab does not have to print it pt when pt/son gets there

## 2020-11-04 ENCOUNTER — Ambulatory Visit (HOSPITAL_COMMUNITY)
Admission: RE | Disposition: A | Discharge status: Home / Self Care | Payer: Self-pay | Source: Home / Self Care | Attending: Cardiovascular Disease

## 2020-11-04 ENCOUNTER — Other Ambulatory Visit: Payer: Self-pay

## 2020-11-04 ENCOUNTER — Ambulatory Visit (HOSPITAL_COMMUNITY)
Admission: RE | Admit: 2020-11-04 | Discharge: 2020-11-04 | Disposition: A | Discharge status: Home / Self Care | Payer: Medicare HMO | Attending: Cardiovascular Disease | Admitting: Cardiovascular Disease

## 2020-11-04 DIAGNOSIS — I2582 Chronic total occlusion of coronary artery: Secondary | ICD-10-CM | POA: Insufficient documentation

## 2020-11-04 DIAGNOSIS — I110 Hypertensive heart disease with heart failure: Secondary | ICD-10-CM | POA: Insufficient documentation

## 2020-11-04 DIAGNOSIS — I5042 Chronic combined systolic (congestive) and diastolic (congestive) heart failure: Secondary | ICD-10-CM | POA: Diagnosis not present

## 2020-11-04 DIAGNOSIS — I2584 Coronary atherosclerosis due to calcified coronary lesion: Secondary | ICD-10-CM | POA: Diagnosis not present

## 2020-11-04 DIAGNOSIS — Z79899 Other long term (current) drug therapy: Secondary | ICD-10-CM | POA: Insufficient documentation

## 2020-11-04 DIAGNOSIS — I209 Angina pectoris, unspecified: Secondary | ICD-10-CM

## 2020-11-04 DIAGNOSIS — I25119 Atherosclerotic heart disease of native coronary artery with unspecified angina pectoris: Secondary | ICD-10-CM | POA: Diagnosis not present

## 2020-11-04 DIAGNOSIS — Z951 Presence of aortocoronary bypass graft: Secondary | ICD-10-CM | POA: Diagnosis not present

## 2020-11-04 DIAGNOSIS — Z7982 Long term (current) use of aspirin: Secondary | ICD-10-CM | POA: Diagnosis not present

## 2020-11-04 DIAGNOSIS — I4891 Unspecified atrial fibrillation: Secondary | ICD-10-CM | POA: Diagnosis not present

## 2020-11-04 DIAGNOSIS — I2510 Atherosclerotic heart disease of native coronary artery without angina pectoris: Secondary | ICD-10-CM | POA: Diagnosis present

## 2020-11-04 HISTORY — PX: LEFT HEART CATH AND CORS/GRAFTS ANGIOGRAPHY: CATH118250

## 2020-11-04 LAB — CBC
Hematocrit: 31.3 % — ABNORMAL LOW (ref 37.5–51.0)
Hemoglobin: 10.4 g/dL — ABNORMAL LOW (ref 13.0–17.7)
MCH: 31.6 pg (ref 26.6–33.0)
MCHC: 33.2 g/dL (ref 31.5–35.7)
MCV: 95 fL (ref 79–97)
Platelets: 293 10*3/uL (ref 150–450)
RBC: 3.29 x10E6/uL — ABNORMAL LOW (ref 4.14–5.80)
RDW: 13 % (ref 11.6–15.4)
WBC: 6.2 10*3/uL (ref 3.4–10.8)

## 2020-11-04 SURGERY — LEFT HEART CATH AND CORS/GRAFTS ANGIOGRAPHY
Anesthesia: LOCAL

## 2020-11-04 MED ORDER — SODIUM CHLORIDE 0.9% FLUSH: 3.0000 mL | INTRAVENOUS | Status: DC | PRN

## 2020-11-04 MED ORDER — CLOPIDOGREL BISULFATE 75 MG PO TABS: 75.0000 mg | ORAL_TABLET | Freq: Every day | ORAL | Status: DC

## 2020-11-04 MED ORDER — HYDRALAZINE HCL 20 MG/ML IJ SOLN: 10.0000 mg | INTRAMUSCULAR | Status: DC | PRN

## 2020-11-04 MED ORDER — DIAZEPAM 2 MG PO TABS: 2.0000 mg | ORAL_TABLET | Freq: Four times a day (QID) | ORAL | Status: DC | PRN

## 2020-11-04 MED ORDER — FENTANYL CITRATE (PF) 100 MCG/2ML IJ SOLN
INTRAMUSCULAR | Status: AC
  Filled 2020-11-04: qty 2

## 2020-11-04 MED ORDER — HEPARIN (PORCINE) IN NACL 1000-0.9 UT/500ML-% IV SOLN
INTRAVENOUS | Status: AC
  Filled 2020-11-04: qty 1000

## 2020-11-04 MED ORDER — IOHEXOL 350 MG/ML SOLN
INTRAVENOUS | Status: AC
  Filled 2020-11-04: qty 1

## 2020-11-04 MED ORDER — SODIUM CHLORIDE 0.9 % IV SOLN: 250.0000 mL | INTRAVENOUS | Status: DC | PRN

## 2020-11-04 MED ORDER — VERAPAMIL HCL 2.5 MG/ML IV SOLN
INTRAVENOUS | Status: AC
  Filled 2020-11-04: qty 2

## 2020-11-04 MED ORDER — HEPARIN (PORCINE) IN NACL 1000-0.9 UT/500ML-% IV SOLN
INTRAVENOUS | Status: DC | PRN
  Administered 2020-11-04 (×2): 500 mL

## 2020-11-04 MED ORDER — SODIUM CHLORIDE 0.9 % IV SOLN: INTRAVENOUS | Status: DC

## 2020-11-04 MED ORDER — LIDOCAINE HCL (PF) 1 % IJ SOLN
INTRAMUSCULAR | Status: AC
  Filled 2020-11-04: qty 30

## 2020-11-04 MED ORDER — HEPARIN SODIUM (PORCINE) 1000 UNIT/ML IJ SOLN
INTRAMUSCULAR | Status: AC
  Filled 2020-11-04: qty 1

## 2020-11-04 MED ORDER — FENTANYL CITRATE (PF) 100 MCG/2ML IJ SOLN
INTRAMUSCULAR | Status: DC | PRN
  Administered 2020-11-04: 25 ug via INTRAVENOUS

## 2020-11-04 MED ORDER — MIDAZOLAM HCL 2 MG/2ML IJ SOLN
INTRAMUSCULAR | Status: AC
  Filled 2020-11-04: qty 2

## 2020-11-04 MED ORDER — MIDAZOLAM HCL 2 MG/2ML IJ SOLN
INTRAMUSCULAR | Status: DC | PRN
  Administered 2020-11-04: 1 mg via INTRAVENOUS

## 2020-11-04 MED ORDER — ASPIRIN 81 MG PO CHEW: 81.0000 mg | CHEWABLE_TABLET | ORAL | Status: DC

## 2020-11-04 MED ORDER — SODIUM CHLORIDE 0.9% FLUSH: 3.0000 mL | Freq: Two times a day (BID) | INTRAVENOUS | Status: DC

## 2020-11-04 MED ORDER — ASPIRIN 81 MG PO CHEW: 81.0000 mg | CHEWABLE_TABLET | Freq: Every day | ORAL | Status: DC

## 2020-11-04 MED ORDER — SODIUM CHLORIDE 0.9 % IV SOLN: INTRAVENOUS | Status: AC

## 2020-11-04 MED ORDER — LABETALOL HCL 5 MG/ML IV SOLN: 10.0000 mg | INTRAVENOUS | Status: DC | PRN

## 2020-11-04 MED ORDER — ONDANSETRON HCL 4 MG/2ML IJ SOLN: 4.0000 mg | Freq: Four times a day (QID) | INTRAMUSCULAR | Status: DC | PRN

## 2020-11-04 MED ORDER — IOHEXOL 350 MG/ML SOLN
INTRAVENOUS | Status: DC | PRN
  Administered 2020-11-04: 115 mL via INTRA_ARTERIAL

## 2020-11-04 MED ORDER — ACETAMINOPHEN 325 MG PO TABS: 650.0000 mg | ORAL_TABLET | ORAL | Status: DC | PRN

## 2020-11-04 MED ORDER — LIDOCAINE HCL (PF) 1 % IJ SOLN
INTRAMUSCULAR | Status: DC | PRN
  Administered 2020-11-04: 13 mL

## 2020-11-04 SURGICAL SUPPLY — 8 items
CATH INFINITI 5FR MULTPACK ANG (CATHETERS) ×2 IMPLANT
KIT HEART LEFT (KITS) ×2 IMPLANT
PACK CARDIAC CATHETERIZATION (CUSTOM PROCEDURE TRAY) ×2 IMPLANT
SHEATH PINNACLE 5F 10CM (SHEATH) ×2 IMPLANT
SHEATH PROBE COVER 6X72 (BAG) ×2 IMPLANT
TRANSDUCER W/STOPCOCK (MISCELLANEOUS) ×2 IMPLANT
TUBING CIL FLEX 10 FLL-RA (TUBING) ×2 IMPLANT
WIRE EMERALD 3MM-J .035X150CM (WIRE) ×2 IMPLANT

## 2020-11-04 NOTE — Progress Notes (Signed)
Site area: Right groin a 5 french arterial sheath was removed  Site Prior to Removal:  Level 0  Pressure Applied For 20 MINUTES    Bedrest Beginning at 1230p  Manual:   Yes.    Patient Status During Pull:  stable  Post Pull Groin Site:  Level 0  Post Pull Instructions Given:  Yes.    Post Pull Pulses Present:  Yes.    Dressing Applied:  Yes.    Comments:

## 2020-11-04 NOTE — Discharge Instructions (Signed)

## 2020-11-04 NOTE — Interval H&P Note (Signed)
Cath Lab Visit (complete for each Cath Lab visit)  Clinical Evaluation Leading to the Procedure:   ACS: No.  Non-ACS:    Anginal Classification: CCS III  Anti-ischemic medical therapy: Maximal Therapy (2 or more classes of medications)  Non-Invasive Test Results: No non-invasive testing performed  Prior CABG: Previous CABG      History and Physical Interval Note:  11/04/2020 10:53 AM  Blake Burgess  has presented today for surgery, with the diagnosis of unstable angina.  The various methods of treatment have been discussed with the patient and family. After consideration of risks, benefits and other options for treatment, the patient has consented to  Procedure(s): LEFT HEART CATH AND CORS/GRAFTS ANGIOGRAPHY (N/A) as a surgical intervention.  The patient's history has been reviewed, patient examined, no change in status, stable for surgery.  I have reviewed the patient's chart and labs.  Questions were answered to the patient's satisfaction.     [name]

## 2020-11-05 ENCOUNTER — Encounter (HOSPITAL_COMMUNITY): Payer: Self-pay | Admitting: Cardiovascular Disease

## 2020-11-05 MED FILL — Heparin Sodium (Porcine) Inj 1000 Unit/ML: INTRAMUSCULAR | Qty: 10 | Status: AC

## 2020-11-12 ENCOUNTER — Other Ambulatory Visit: Payer: Self-pay

## 2020-11-12 ENCOUNTER — Emergency Department (HOSPITAL_COMMUNITY): Payer: Medicare HMO

## 2020-11-12 ENCOUNTER — Emergency Department (HOSPITAL_COMMUNITY)
Admission: EM | Admit: 2020-11-12 | Discharge: 2020-11-12 | Disposition: A | Discharge status: Home / Self Care | Payer: Medicare HMO | Attending: Emergency Medicine | Admitting: Emergency Medicine

## 2020-11-12 ENCOUNTER — Encounter (HOSPITAL_COMMUNITY): Payer: Self-pay | Admitting: Emergency Medicine

## 2020-11-12 ENCOUNTER — Other Ambulatory Visit: Payer: Self-pay | Admitting: Medical

## 2020-11-12 DIAGNOSIS — I5042 Chronic combined systolic (congestive) and diastolic (congestive) heart failure: Secondary | ICD-10-CM | POA: Diagnosis not present

## 2020-11-12 DIAGNOSIS — I25709 Atherosclerosis of coronary artery bypass graft(s), unspecified, with unspecified angina pectoris: Secondary | ICD-10-CM | POA: Diagnosis not present

## 2020-11-12 DIAGNOSIS — Z96611 Presence of right artificial shoulder joint: Secondary | ICD-10-CM | POA: Diagnosis not present

## 2020-11-12 DIAGNOSIS — R079 Chest pain, unspecified: Secondary | ICD-10-CM | POA: Diagnosis not present

## 2020-11-12 DIAGNOSIS — Z7902 Long term (current) use of antithrombotics/antiplatelets: Secondary | ICD-10-CM | POA: Insufficient documentation

## 2020-11-12 DIAGNOSIS — Z950 Presence of cardiac pacemaker: Secondary | ICD-10-CM | POA: Insufficient documentation

## 2020-11-12 DIAGNOSIS — Z7982 Long term (current) use of aspirin: Secondary | ICD-10-CM | POA: Insufficient documentation

## 2020-11-12 DIAGNOSIS — E119 Type 2 diabetes mellitus without complications: Secondary | ICD-10-CM | POA: Insufficient documentation

## 2020-11-12 DIAGNOSIS — I480 Paroxysmal atrial fibrillation: Secondary | ICD-10-CM | POA: Diagnosis not present

## 2020-11-12 DIAGNOSIS — I10 Essential (primary) hypertension: Secondary | ICD-10-CM | POA: Diagnosis not present

## 2020-11-12 DIAGNOSIS — Z7901 Long term (current) use of anticoagulants: Secondary | ICD-10-CM

## 2020-11-12 DIAGNOSIS — Z951 Presence of aortocoronary bypass graft: Secondary | ICD-10-CM | POA: Insufficient documentation

## 2020-11-12 DIAGNOSIS — Z79899 Other long term (current) drug therapy: Secondary | ICD-10-CM | POA: Insufficient documentation

## 2020-11-12 DIAGNOSIS — I110 Hypertensive heart disease with heart failure: Secondary | ICD-10-CM | POA: Insufficient documentation

## 2020-11-12 DIAGNOSIS — Z8673 Personal history of transient ischemic attack (TIA), and cerebral infarction without residual deficits: Secondary | ICD-10-CM | POA: Diagnosis not present

## 2020-11-12 DIAGNOSIS — Z85828 Personal history of other malignant neoplasm of skin: Secondary | ICD-10-CM | POA: Diagnosis not present

## 2020-11-12 DIAGNOSIS — R0602 Shortness of breath: Secondary | ICD-10-CM | POA: Diagnosis not present

## 2020-11-12 DIAGNOSIS — I2510 Atherosclerotic heart disease of native coronary artery without angina pectoris: Secondary | ICD-10-CM | POA: Insufficient documentation

## 2020-11-12 LAB — HEPATIC FUNCTION PANEL
ALT: 19 U/L (ref 0–44)
AST: 22 U/L (ref 15–41)
Albumin: 3.1 g/dL — ABNORMAL LOW (ref 3.5–5.0)
Alkaline Phosphatase: 56 U/L (ref 38–126)
Bilirubin, Direct: 0.1 mg/dL (ref 0.0–0.2)
Indirect Bilirubin: 0.4 mg/dL (ref 0.3–0.9)
Total Bilirubin: 0.5 mg/dL (ref 0.3–1.2)
Total Protein: 5.5 g/dL — ABNORMAL LOW (ref 6.5–8.1)

## 2020-11-12 LAB — CBC
HCT: 34.5 % — ABNORMAL LOW (ref 39.0–52.0)
Hemoglobin: 10.9 g/dL — ABNORMAL LOW (ref 13.0–17.0)
MCH: 30.8 pg (ref 26.0–34.0)
MCHC: 31.6 g/dL (ref 30.0–36.0)
MCV: 97.5 fL (ref 80.0–100.0)
Platelets: 291 10*3/uL (ref 150–400)
RBC: 3.54 MIL/uL — ABNORMAL LOW (ref 4.22–5.81)
RDW: 13 % (ref 11.5–15.5)
WBC: 5.4 10*3/uL (ref 4.0–10.5)
nRBC: 0 % (ref 0.0–0.2)

## 2020-11-12 LAB — TROPONIN I (HIGH SENSITIVITY)
Troponin I (High Sensitivity): 13 ng/L (ref <18)
Troponin I (High Sensitivity): 14 ng/L (ref <18)

## 2020-11-12 LAB — BASIC METABOLIC PANEL
Anion gap: 7 (ref 5–15)
BUN: 12 mg/dL (ref 8–23)
CO2: 24 mmol/L (ref 22–32)
Calcium: 8.7 mg/dL — ABNORMAL LOW (ref 8.9–10.3)
Chloride: 108 mmol/L (ref 98–111)
Creatinine, Ser: 1.24 mg/dL (ref 0.61–1.24)
GFR, Estimated: 57 mL/min — ABNORMAL LOW (ref >60)
Glucose, Bld: 139 mg/dL — ABNORMAL HIGH (ref 70–99)
Potassium: 3.5 mmol/L (ref 3.5–5.1)
Sodium: 139 mmol/L (ref 135–145)

## 2020-11-12 MED ORDER — ISOSORBIDE MONONITRATE ER 30 MG PO TB24: 30.0000 mg | ORAL_TABLET | Freq: Every day | ORAL | 3 refills | Status: DC

## 2020-11-12 MED ORDER — APIXABAN 5 MG PO TABS: 5.0000 mg | ORAL_TABLET | Freq: Two times a day (BID) | ORAL | 3 refills | Status: DC

## 2020-11-12 MED ORDER — METOPROLOL SUCCINATE ER 50 MG PO TB24
50.0000 mg | ORAL_TABLET | Freq: Every day | ORAL | 3 refills | Status: DC
Start: 2020-11-12 — End: 2021-11-09

## 2020-11-12 MED ORDER — NITROGLYCERIN 0.4 MG SL SUBL: 0.4000 mg | SUBLINGUAL_TABLET | SUBLINGUAL | 3 refills | Status: DC | PRN

## 2020-11-12 MED ORDER — OMEPRAZOLE MAGNESIUM 20 MG PO TBEC: 20.0000 mg | DELAYED_RELEASE_TABLET | Freq: Every day | ORAL | 1 refills | Status: DC

## 2020-11-12 MED ORDER — PANTOPRAZOLE SODIUM 20 MG PO TBEC: 20.0000 mg | DELAYED_RELEASE_TABLET | Freq: Every day | ORAL | 3 refills | Status: DC

## 2020-11-12 NOTE — ED Notes (Signed)
ED Provider at bedside, Collier Salina

## 2020-11-12 NOTE — Discharge Instructions (Addendum)
Please keep a log of your blood pressures to bring to your follow-up appointment with Dr. Percival Spanish.  It is possible your chest pain is related to your recently diagnosed atrial fibrillation. Please discuss further at your visit with Dr. Percival Spanish. May be helpful to get a follow-up visit with Dr. Lovena Le.   We recommend an outpatient echocardiogram (ultrasound of your heart) - the cardiology office will contact you to arrange this appointment  MEDICATION CHANGES: - INCREASE imdur (isosorbide mononitrate) to 30 mg daily (1 tablet) - INCREASE Toprol XL (metoprolol succinate) to 50mg  daily  - STOP aspirin 81 mg daily to minimize bleeding risk - STOP plavix (clopidogrel) to minimize bleeding risk - START apixaban 5mg  two times per day for prevention of stroke with your recently diagnosed atrial fibrillation   You came to the emergency department today to be evaluated for your chest pain.  Your lab work and EKG feel that you are not having an acute heart attack today.  The cardiologist spoke with you made some changes to your medications.  Please follow their instructions on medication changes.  Please follow-up with your cardiologist Dr. Percival Spanish as soon as possible.  Get help right away if:  Your chest pain gets worse.  You have a cough that gets worse, or you cough up blood.  You have severe pain in your abdomen.  You faint.  You have sudden, unexplained chest discomfort.  You have sudden, unexplained discomfort in your arms, back, neck, or jaw.  You have shortness of breath at any time.  You suddenly start to sweat, or your skin gets clammy.  You feel nausea or you vomit.  You suddenly feel lightheaded or dizzy.  You have severe weakness, or unexplained weakness or fatigue.  Your heart begins to beat quickly, or it feels like it is skipping beats.

## 2020-11-12 NOTE — ED Provider Notes (Signed)
[facility] EMERGENCY DEPARTMENT Provider Note   CSN: [id number] Arrival date & time: 11/12/20  1023     History Chief Complaint  Patient presents with  . Chest Pain    Blake Burgess is a 87 y.o. male with a history of CAD (status post CABG, LHC on 11/04/20 known severe native coronary calcification and multivessel CAD), ICD in place, aortic stenosis, retention, per lipidemia, CVA, GERD.  Patient presents with a chief complaint of chest pain.  Patient states that woke early this morning," he was woken from sleep with complaints of chest pain and shortness of breath.  Patient reports chest pain to the left side of his chest.  No radiation of his pain.  Describes pain as a aching sensation and rates it 10/10 on the pain scale.  Patient reports that the pain lasted for "a few minutes," was resolved once he got out of bed and ambulated.  Patient denies any chest pain or discomfort at present.  Denies any associated nausea, vomiting, or diaphoresis.  Patient endorses nonproductive cough.  Patient states that he has been having intermittent chest pain over the last 4 days.  His chest pain is coming on both at rest and with exertion.  Patient has been taking nitroglycerin as needed with relief of his symptoms.  Patient last took nitroglycerin tablet last night before he went to bed.    Patient has been taking medication as prescribed.  Reports that he took his medication prior to coming to the emergency department.  Per chart review patient had left heart catheterization performed on 11/04/2020 which showed severe native coronary calcification and multivessel CAD.  Conditions at that time were increase medical therapy with possible further titration of amlodipine and beta-blocker therapy.  Recommend a follow-up 2D echo Doppler study.  The patient will follow up with Dr. Percival Spanish.  Patient has not he has not followed up with his cardiologist Dr. Percival Spanish since this procedure.  HPI      Past Medical History:  Diagnosis Date  . Aortic stenosis    mild AS 09/2017 echo  . Cancer ([place])    skin  . Coronary artery disease    a.  s/p CABG;   b. cath 4/12: EF 55%, 3vCAD, patent L-LAD, patent S-RCA, patent S-CFX (done after a false pos. ETT)  . Diverticular disease   . GERD (gastroesophageal reflux disease)   . GI bleed   . Hemorrhoids   . HH (hiatus hernia)   . History of kidney stones   . Hypertension   . Osteoarthritis   . Other and unspecified hyperlipidemia   . Presence of permanent cardiac pacemaker   . Schatzki's ring   . Stroke [facility])     Patient Active Problem List   Diagnosis Date Noted  . Pacemaker 11/06/2019  . Educated about COVID-19 virus infection 12/12/2018  . Nonrheumatic aortic valve stenosis 12/12/2018  . Actinic skin damage 10/13/2018  . Back pain 10/13/2018  . Cancer ([place]) 10/13/2018  . Diverticulosis 10/13/2018  . Spinal stenosis 10/13/2018  . Hearing deficit 10/13/2018  . Complete atrioventricular block ([place]) 10/13/2018  . Dyspnea 10/13/2018  . S/P shoulder replacement, right 08/25/2018  . History of artificial joint 08/25/2018  . Preop cardiovascular exam 07/13/2018  . Coronary artery disease involving native coronary artery of native heart without angina pectoris 06/05/2018  . Chronic combined systolic and diastolic heart failure ([place]) 06/05/2018  . Dyslipidemia 06/05/2018  . Pain in joint of right shoulder 05/09/2018  .  Osteoarthritis of right glenohumeral joint 05/09/2018  . ................................................................................... 04/18/2018  . Elevated troponin 04/18/2018  . 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 Cardiac pacemaker in situ 10/11/2017  . Acute diastolic (congestive) heart failure ([place]) 10/11/2017  . Acute diastolic heart failure ([place]) 10/11/2017  . CHB (complete heart block) ([place])   . SOB (shortness of breath)   . Dyslipidemia, goal LDL below 70   . Risk for falls  12/21/2016  . Parent-foster child problem 11/25/2016  . Lumbar stenosis with neurogenic claudication 08/04/2016  . Lightheadedness 02/03/2016  . Right ear pain 03/22/2013  . Shingles 03/22/2013  . Other specified cardiac arrhythmias 02/19/2013  . Old myocardial infarction 02/19/2013  . History of total hip replacement 02/19/2013  . History of TIA (transient ischemic attack) 02/19/2013  . Type II diabetes mellitus ([employer]) 02/19/2013  . Generalized ischemic cerebrovascular disease 02/19/2013  . Heart disease 02/19/2013  . Status post aorto-coronary artery bypass graft 02/19/2013  . Anemia 02/18/2013  . B12 deficiency 02/18/2013  . Kidney stones 02/18/2013  . GERD (gastroesophageal reflux disease) 02/18/2013  . Esophageal stricture 02/18/2013  . Stroke ([place]) 02/18/2013  . DJD (degenerative joint disease) 02/18/2013  . Hyperlipidemia 02/18/2013  . Arthritis 01/10/2013  . Angina pectoris ([place]) 12/14/2010  . Malaise and fatigue 10/19/2010  . COUGH 08/07/2010  . ANEMIA, SECONDARY TO ACUTE BLOOD LOSS 03/03/2010  . GI BLEED 03/03/2010  . Osteoarthritis 03/03/2010  . Gastrointestinal hemorrhage, unspecified 03/03/2010  . Atherosclerotic heart disease of native coronary artery without angina pectoris 02/23/2010  . DIZZINESS 12/04/2009  . Essential hypertension 02/05/2009  . Hx of CABG-2008 02/05/2009  . History of coronary artery bypass graft 02/05/2009  . H/O acute myocardial infarction 08/23/2006    Past Surgical History:  Procedure Laterality Date  . ARTERIOVENOUS GRAFT PLACEMENT W/ ENDOSCOPIC VEIN HARVEST     of the right leg greater spahenous vein. Surgeon: Tharon Aquas Trigt,M.D.  . COLONOSCOPY  02/24/2010   Hemorrhoids, Diverticulosis. Performed at [facility]. Normal terminal ileum. Dr. June Leap, [place] GRAFT  06/21/2007   CABG x 3 Surgeon Ivin Poot, MD  . EYE SURGERY     bilateral cataract removal  . hip replace  06/09/2004   left hip Surgeon  Pietro Cassis. Alvan Dame, MD  . LEFT HEART CATH AND CORS/GRAFTS ANGIOGRAPHY N/A 11/04/2020   Procedure: LEFT HEART CATH AND CORS/GRAFTS ANGIOGRAPHY;  Surgeon: Troy Sine, MD;  Location: [facility];  Service: Cardiovascular;  Laterality: N/A;  . PACEMAKER IMPLANT N/A 10/03/2017   Procedure: PACEMAKER IMPLANT;  Surgeon: Evans Lance, MD;  Location: [facility];  Service: Cardiovascular;  Laterality: N/A;  . REVERSE SHOULDER ARTHROPLASTY Right 08/25/2018   Procedure: REVERSE SHOULDER ARTHROPLASTY;  Surgeon: Netta Cedars, MD;  Location: [facility];  Service: Orthopedics;  Laterality: Right;       Family History  Problem Relation Age of Onset  . Heart attack Mother   . Hypertension Mother   . Diabetes Father   . Diabetes Brother   . Diabetes Sister     Social History   Tobacco Use  . Smoking status: Never Smoker  . Smokeless tobacco: Never Used  Vaping Use  . Vaping Use: Never used  Substance Use Topics  . Alcohol use: No  . Drug use: No    Home Medications Prior to Admission medications   Medication Sig Start Date End Date Taking? Authorizing Provider  acetaminophen (TYLENOL) 500 MG tablet Take 1,000 mg by  mouth every 6 (six) hours as needed for moderate pain.    [provider]  amLODipine (NORVASC) 2.5 MG tablet Take 2.5 mg by mouth daily. 10/18/19   [provider]  aspirin 81 MG tablet Take 1 tablet (81 mg total) by mouth daily. 06/12/13   Kinnie Feil, MD  atorvastatin (LIPITOR) 40 MG tablet Take 40 mg by mouth daily. 10/22/20   [provider]  clopidogrel (PLAVIX) 75 MG tablet Take 75 mg by mouth daily.    [provider]  Cyanocobalamin 1000 MCG/ML KIT Inject 1,000 mcg as directed every 30 (thirty) days.    [provider]  donepezil (ARICEPT) 10 MG tablet Take 10 mg by mouth at bedtime.    [provider]  enalapril (VASOTEC) 20 MG tablet Take 20 mg by mouth daily.    [provider]  isosorbide  mononitrate (IMDUR) 30 MG 24 hr tablet Take 30 mg by mouth daily.    [provider]  Lidocaine 4 % PTCH Apply 1 patch topically daily as needed (pain).    [provider]  metoprolol succinate (TOPROL-XL) 25 MG 24 hr tablet Take 50 mg by mouth daily.    [provider]  nitroGLYCERIN (NITROSTAT) 0.4 MG SL tablet Place 1 tablet under the tongue every 5 (five) minutes as needed for chest pain. 04/03/18   [provider]  omeprazole (PRILOSEC) 20 MG capsule Take 20 mg by mouth daily.  07/11/18   [provider]  tamsulosin (FLOMAX) 0.4 MG CAPS capsule Take 0.4 mg by mouth daily.    [provider]    Allergies    Patient has no known allergies.  Review of Systems   Review of Systems  Constitutional: Negative for chills, diaphoresis and fever.  Eyes: Negative for visual disturbance.  Respiratory: Positive for cough. Negative for shortness of breath.   Cardiovascular: Positive for chest pain and leg swelling (left lower leg).  Gastrointestinal: Negative for abdominal pain, nausea and vomiting.  Genitourinary: Negative for difficulty urinating and dysuria.  Musculoskeletal: Negative for back pain and neck pain.  Skin: Negative for color change and rash.  Neurological: Negative for dizziness, syncope, light-headedness and headaches.  Psychiatric/Behavioral: Negative for confusion.    Physical Exam Updated Vital Signs BP (!) 150/69   Pulse 73   Temp 98.2 F (36.8 C) (Oral)   Resp (!) 21   SpO2 99%   Physical Exam Vitals and nursing note reviewed.  Constitutional:      General: He is not in acute distress.    Appearance: He is not ill-appearing, toxic-appearing or diaphoretic.  HENT:     Head: Normocephalic.  Eyes:     General: No scleral icterus.       Right eye: No discharge.        Left eye: No discharge.  Neck:     Vascular: No JVD.  Cardiovascular:     Rate and Rhythm: Normal rate.     Pulses:          Carotid pulses  are 3+ on the right side and 3+ on the left side.      Radial pulses are 3+ on the right side and 3+ on the left side.       Femoral pulses are 3+ on the right side and 3+ on the left side.    Heart sounds: Murmur heard.    Pulmonary:     Effort: Pulmonary effort is normal. No tachypnea, bradypnea or respiratory  distress.     Breath sounds: Normal breath sounds. No wheezing, rhonchi or rales.     Comments: Speaking full complete sentences without difficulty Chest:     Chest wall: No mass, lacerations, deformity, swelling, tenderness or crepitus.  Abdominal:     General: Abdomen is flat. There is no distension. There are no signs of injury.     Palpations: Abdomen is soft. There is no mass or pulsatile mass.     Tenderness: There is no abdominal tenderness. There is no guarding or rebound.     Hernia: There is no hernia in the umbilical area or ventral area.  Musculoskeletal:     Cervical back: Neck supple.     Right lower leg: No swelling, deformity, lacerations, tenderness or bony tenderness. No edema.     Left lower leg: No swelling, deformity, lacerations, tenderness or bony tenderness. No edema.     Comments: Patient ecchymosis to right upper thigh, no mass, hematoma, wound or erythema observed  Trace pedal edema noted  Skin:    General: Skin is warm and dry.  Neurological:     General: No focal deficit present.     Mental Status: He is alert.     GCS: GCS eye subscore is 4. GCS verbal subscore is 5. GCS motor subscore is 6.  Psychiatric:        Behavior: Behavior is cooperative.     ED Results / Procedures / Treatments   Labs (all labs ordered are listed, but only abnormal results are displayed) Labs Reviewed  BASIC METABOLIC PANEL - Abnormal; Notable for the following components:      Result Value   Glucose, Bld 139 (*)    Calcium 8.7 (*)    GFR, Estimated 57 (*)    All other components within normal limits  CBC - Abnormal; Notable for the following components:    RBC 3.54 (*)    Hemoglobin 10.9 (*)    HCT 34.5 (*)    All other components within normal limits  HEPATIC FUNCTION PANEL - Abnormal; Notable for the following components:   Total Protein 5.5 (*)    Albumin 3.1 (*)    All other components within normal limits  TROPONIN I (HIGH SENSITIVITY)  TROPONIN I (HIGH SENSITIVITY)    EKG EKG Interpretation  Date/Time:  Wednesday November 12 2020 10:31:47 EDT Ventricular Rate:  81 PR Interval:  194 QRS Duration: 96 QT Interval:  398 QTC Calculation: 462 R Axis:   94 Text Interpretation: Atrial-sensed ventricular-paced rhythm Abnormal ECG Confirmed by Dene Gentry [phone]) on 11/12/2020 11:39:21 AM   Radiology DG Chest 2 View  Result Date: 11/12/2020 CLINICAL DATA:  Chest pain and shortness of breath EXAM: CHEST - 2 VIEW COMPARISON:  10/23/2020 FINDINGS: Cardiac shadow is stable. Postsurgical changes and prior pacing device are again noted. Lungs are well aerated bilaterally. Minimal right basilar atelectasis is seen. Small right-sided pleural effusion is noted. Postsurgical changes in the right shoulder are seen. IMPRESSION: Mild right basilar atelectasis and associated small effusion. Electronically Signed   By: Inez Catalina M.D.   On: 11/12/2020 11:11    Procedures Procedures   Medications Ordered in ED Medications - No data to display  ED Course  I have reviewed the triage vital signs and the nursing notes.  Pertinent labs & imaging results that were available during my care of the patient were reviewed by me and considered in my medical decision making (see chart for details).    MDM Rules/Calculators/A&P  Alert 87 year old male in no acute distress, nontoxic-appearing.  Patient presents with chief complaint of chest pain.  Patient reports that he has been having intermittent pain over the last 4 days.  Latest episode of chest pain occurred "earlier this morning."  Patient reports that this pain woke him from  sleep.  There was associated shortness of breath.  Pain lasted "a few minutes," until he stood up and ambulated.  Patient is pain-free at this time.  Per chart review patient had left heart catheterization 11/04/2020 that showed severe native coronary calcification and multivessel CAD.  Medical management was recommended with patient to follow-up with his cardiologist Dr. Percival Spanish.  Patient reports he has not seen his cardiologist since his catheterization.  His x-ray shows mild bibasilar atelectasis and associated small effusion.  EKG shows Atrial-sensed ventricular-paced rhythm.  Troponin 13.  Patient noted to be hypertensive initially 163/65 on arrival.  Oxygen saturation 99% or greater on room air.  Patient able speak in full sentences.  No JVD noted.  Minimal trace edema noted.  No edema, swelling, or tenderness to lower extremities.  Suspect unstable angina as source of patients complaints.  Will consult with Cardiology.  1323 spoke with Dr. Tamala Julian with cardiology who will independently reviewed patient's labs and see the patient.  Low suspicion for PE as patient has no tachycardia, decreased oxygen saturation, hemoptysis, unilateral leg swelling or tenderness, active cancer treatment, prolonged immobilization, hormone therapy.    Second troponin 14 with delta of +1.  On serial reexamination patient continues to deny any chest pain or discomfort.  Cardiology recommends that patient is okay for discharge, Imdur was increased to 30 mg daily, sublingual nitro prescription renewed, patient will have outpatient echocardiogram.  Patient started on apixaban 5 mg twice daily.    Patient was advised to follow-up with cardiologist Dr. Percival Spanish.  Discussed results, findings, treatment and follow up. Patient advised of return precautions. Patient verbalized understanding and agreed with plan.   Final Clinical Impression(s) / ED Diagnoses Final diagnoses:  Chest pain, unspecified type    Rx / DC  Orders ED Discharge Orders         Ordered    isosorbide mononitrate (IMDUR) 30 MG 24 hr tablet  Daily        11/12/20 1601    metoprolol succinate (TOPROL-XL) 50 MG 24 hr tablet  Daily        11/12/20 1601    nitroGLYCERIN (NITROSTAT) 0.4 MG SL tablet  Every 5 min PRN        11/12/20 1601    pantoprazole (PROTONIX) 20 MG tablet  Daily,   Status:  Discontinued        11/12/20 1601    apixaban (ELIQUIS) 5 MG TABS tablet  2 times daily        11/12/20 1607    omeprazole (PRILOSEC OTC) 20 MG tablet  Daily        11/12/20 1607           Dyann Ruddle 11/12/20 2154    Valarie Merino, MD 11/13/20 1500

## 2020-11-12 NOTE — ED Triage Notes (Signed)
Reports chest pain that started during the night and SOB.  Denies nausea, vomiting, and any other symptoms.  States pain has improved some.  Family reports normal cardiac cath last week.

## 2020-11-12 NOTE — Consult Note (Addendum)
The patient has been seen in conjunction with Roby Lofts, PAC. All aspects of care have been considered and discussed. The patient has been personally interviewed, examined, and all clinical data has been reviewed.  The digital images of the recent coronary angiogram was personally reviewed.  We also identified episodes of atrial fibrillation on recent telemetry checks in February with intention/recommendation to start Eliquis. We have encouraged the patient to use sublingual nitroglycerin for episodes of chest pain.  This should only be done while sitting or lying. We have increased metoprolol succinate to 50 mg/day. We will discontinue aspirin and Plavix and start the patient on Eliquis 5 mg twice daily. If he continues to have angina not related to episodes of atrial fibrillation, it would be reasonable to further uptitrate long-acting nitrate. Follow-up with Dr. Percival Spanish and 7 to 10 days with CBC.      Cardiology Consultation:   Patient ID: Blake Burgess MRN: [id number]; DOB: [birth year]  Admit date: 11/12/2020 Date of Consult: 11/12/2020  PCP:  Raelene Bott, [place]  Cardiologist:  Minus Breeding, MD  Advanced Practice Provider:  No care team member to display Electrophysiologist:  None :[id number]}    Patient Profile:   Blake Burgess is a 87 y.o. male with a PMH of CAD s/p CABG, chronic combined CHF, HTN, HLD, mild aortic stenosis, CHB s/p PPM, CVA, and GERD, who is being seen today for the evaluation of chest pain at the request of Dr. Francia Greaves.  History of Present Illness:   Mr. Overfelt was seen outpatient by Dr. Percival Spanish 10/31/20 in follow-up of a recent hospitalization at [facility] for chest pain. Troponin's were reportedly mildly elevated to 0.277. He was felt to be volume overloaded and diuresed. Attempts were made to transfer to [facility], however there were no beds available and he was discharged home with plans to follow-up outpatient. He was  recently diagnosed with paroxysmal atrial fibrillation and recommended to start eliquis, however this was not yet started as patient was hospitalized at the time of this diagnosis. At his visit he reported intermittent chest pains. He was recommended to undergo an outpatient LHC which occurred 11/04/20 showing severe native CAD with CTO of mRCA and mLAD and 80% pLCx stenosis, though patent LIMA to LAD, SVG-OM3, and SVG-dRCA, with mild AS. He was recommended for ongoing medical management at that time with consideration to further titrate amlodipine +/- BBlocker therapy and update an echocardiogram given estimated EF of 39% on NST 07/2020. His last echocardiogram was 09/2017 with EF 50-55%, G1DD, mild LVH, abnormal septal motion, mild AS, mild MR, and mild LAE.   Patient presented to the ED 11/12/20 with reported recurrent chest pain and SOB overnight. Pain was rated as a 10/10 described as an aching sensation. He reported pain lasted for a few minutes, the resolved when he got out of bed. He has continued to have intermittent chest pain since his heart catheterization, both at rest and with activity, with some relief with SL nitro. Given severity of pain overnight he presented to the ED for further evaluation.  On arrival to the ED he was hypertensive and intermittently tachypneic. Labs notable for K 3.5, Cr 1.24, Hgb 10.9, PLT 291, HsTrop 13>14. CXR with mild atelectasis. EKG showed A-sensed, V-paced at 81 bpm. Cardiology asked to evaluate.   Past Medical History:  Diagnosis Date   Aortic stenosis    mild AS 09/2017 echo   Cancer ([place])    skin  Coronary artery disease    a.  s/p CABG;   b. cath 4/12: EF 55%, 3vCAD, patent L-LAD, patent S-RCA, patent S-CFX (done after a false pos. ETT)   Diverticular disease    GERD (gastroesophageal reflux disease)    GI bleed    Hemorrhoids    HH (hiatus hernia)    History of kidney stones    Hypertension    Osteoarthritis    Other and unspecified hyperlipidemia     Presence of permanent cardiac pacemaker    Schatzki's ring    Stroke [facility])     Past Surgical History:  Procedure Laterality Date   ARTERIOVENOUS GRAFT PLACEMENT W/ ENDOSCOPIC VEIN HARVEST     of the right leg greater spahenous vein. Surgeon: Tharon Aquas Trigt,M.D.   COLONOSCOPY  02/24/2010   Hemorrhoids, Diverticulosis. Performed at [place]. Normal terminal ileum. Dr. June Leap, [place] ARTERY BYPASS GRAFT  06/21/2007   CABG x 3 Surgeon Ivin Poot, MD   EYE SURGERY     bilateral cataract removal   hip replace  06/09/2004   left hip Surgeon Pietro Cassis. Alvan Dame, MD   LEFT HEART CATH AND CORS/GRAFTS ANGIOGRAPHY N/A 11/04/2020   Procedure: LEFT HEART CATH AND CORS/GRAFTS ANGIOGRAPHY;  Surgeon: Troy Sine, MD;  Location: [place] CV LAB;  Service: Cardiovascular;  Laterality: N/A;   PACEMAKER IMPLANT N/A 10/03/2017   Procedure: PACEMAKER IMPLANT;  Surgeon: Evans Lance, MD;  Location: [place] CV LAB;  Service: Cardiovascular;  Laterality: N/A;   REVERSE SHOULDER ARTHROPLASTY Right 08/25/2018   Procedure: REVERSE SHOULDER ARTHROPLASTY;  Surgeon: Netta Cedars, MD;  Location: [place];  Service: Orthopedics;  Laterality: Right;     Home Medications:  Prior to Admission medications   Medication Sig Start Date End Date Taking? Authorizing Provider  amLODipine (NORVASC) 2.5 MG tablet Take 2.5 mg by mouth daily. 10/18/19  Yes [provider]  aspirin 81 MG tablet Take 1 tablet (81 mg total) by mouth daily. 06/12/13  Yes Buriev, Arie Sabina, MD  atorvastatin (LIPITOR) 40 MG tablet Take 40 mg by mouth daily. 10/22/20  Yes [provider]  clopidogrel (PLAVIX) 75 MG tablet Take 75 mg by mouth daily.   Yes [provider]  donepezil (ARICEPT) 10 MG tablet Take 10 mg by mouth at bedtime.   Yes [provider]  enalapril (VASOTEC) 20 MG tablet Take 20 mg by mouth daily.   Yes [provider]  isosorbide mononitrate (IMDUR) 60 MG 24 hr  tablet Take 60 mg by mouth daily.   Yes [provider]  metoprolol succinate (TOPROL-XL) 25 MG 24 hr tablet Take 25 mg by mouth daily.   Yes [provider]  nitroGLYCERIN (NITROSTAT) 0.4 MG SL tablet Place 1 tablet under the tongue every 5 (five) minutes as needed for chest pain. 04/03/18  Yes [provider]  omeprazole (PRILOSEC) 20 MG capsule Take 20 mg by mouth daily.  07/11/18  Yes [provider]  tamsulosin (FLOMAX) 0.4 MG CAPS capsule Take 0.4 mg by mouth daily.   Yes [provider]  acetaminophen (TYLENOL) 500 MG tablet Take 1,000 mg by mouth every 6 (six) hours as needed for moderate pain.    [provider]  Cyanocobalamin 1000 MCG/ML KIT Inject 1,000 mcg as directed every 30 (thirty) days.    [provider]  Lidocaine 4 % PTCH Apply 1 patch topically daily as needed (pain).    [provider]  Inpatient Medications: Scheduled Meds:  Continuous Infusions:  PRN Meds:   Allergies:   No Known Allergies  Social History:   Social History   Socioeconomic History   Marital status: Married    Spouse name: Not on file   Number of children: 2   Years of education: Not on file   Highest education level: Not on file  Occupational History    Employer: RETIRED  Tobacco Use   Smoking status: Never Smoker   Smokeless tobacco: Never Used  Vaping Use   Vaping Use: Never used  Substance and Sexual Activity   Alcohol use: No   Drug use: No   Sexual activity: Not on file  Other Topics Concern   Not on file  Social History Narrative   No Regular exercise. Daily Caffeine: 24 oz pepsi and 1 cup coffee.    Social Determinants of Health   Financial Resource Strain: Not on file  Food Insecurity: Not on file  Transportation Needs: Not on file  Physical Activity: Not on file  Stress: Not on file  Social Connections: Not on file  Intimate Partner Violence: Not on file    Family History:    Family  History  Problem Relation Age of Onset   Heart attack Mother    Hypertension Mother    Diabetes Father    Diabetes Brother    Diabetes Sister      ROS:  Please see the history of present illness.   All other ROS reviewed and negative.     Physical Exam/Data:   Vitals:   11/12/20 1415 11/12/20 1430 11/12/20 1500 11/12/20 1618  BP: (!) 148/65 (!) 157/61 (!) 171/72 (!) 160/60  Pulse: 60 (!) 59 71 60  Resp: 18 15 18 18   Temp:    98.3 F (36.8 C)  TempSrc:    Oral  SpO2: 100% 99% 100% 96%   No intake or output data in the 24 hours ending 11/12/20 1630 Last 3 Weights 11/04/2020 10/31/2020 08/06/2020  Weight (lbs) 170 lb 172 lb 12.8 oz 176 lb  Weight (kg) 77.111 kg 78.382 kg 79.833 kg     There is no height or weight on file to calculate BMI.  General:  Thin elderly gentleman laying in bed in no acute distress HEENT: sclera anicteric Lymph: no adenopathy Neck: no JVD Endocrine:  No thryomegaly Vascular: No carotid bruits; FA pulses 2+ bilaterally without bruits  Cardiac:  normal S1, S2; RRR; +murmur, no rubs or gallops Lungs:  clear to auscultation bilaterally, no wheezing, rhonchi or rales  Abd: soft, nontender, no hepatomegaly  Ext: no edema Musculoskeletal:  No deformities, BUE and BLE strength normal and equal Skin: warm and dry  Neuro:  CNs 2-12 intact, no focal abnormalities noted Psych:  Normal affect   EKG:  The EKG was personally reviewed and demonstrates:  A-sensed, V-paced at 81 bpm   Relevant CV Studies: LHC 11/04/20: Mid LM to Prox LAD lesion is 95% stenosed. Mid LAD lesion is 100% stenosed. Dist LAD lesion is 25% stenosed. Ost Cx to Prox Cx lesion is 80% stenosed. Mid Cx lesion is 90% stenosed. 2nd Mrg lesion is 50% stenosed. Prox RCA lesion is 95% stenosed. Mid RCA lesion is 100% stenosed.   Severe native coronary calcification and multivessel CAD without left main and a common ostium giving  rise to a severely calcified proximal LAD with 95% stenosis  and total occlusion of the LAD after a prominent septal perforating artery; and 80% ostial calcified circumflex  stenosis followed by 90% proximal to mid circumflex stenosis.  There is competitive filling of the distal circumflex via the SVG supplying the OM 3 vessel.   Total mid occlusion of the native RCA.   Patent LIMA graft supplying the mid LAD with the distal LAD being a small caliber vessel.   Patent SVG supplying the OM 3 vessel of the circumflex coronary artery.   Patent SVG supplying the distal RCA.   Mild aortic valve stenosis with a 17 mm peak gradient on LV to AO pullback.   RECOMMENDATION: Increase medical therapy with possible further titration of amlodipine and beta-blocker therapy.  Recommend a follow-up 2D echo Doppler study.  The patient will follow up with Dr. Percival Spanish.   Diagnostic Dominance: Right      Echocardiogram 2019:   - Left ventricle: Abnormal septal motion The cavity size was mildly    dilated. Wall thickness was increased in a pattern of mild LVH.    Systolic function was normal. The estimated ejection fraction was    in the range of 50% to 55%. Doppler parameters are consistent    with abnormal left ventricular relaxation (grade 1 diastolic    dysfunction).  - Aortic valve: There was mild stenosis. Valve area (VTI): 1.88    cm^2. Valve area (Vmax): 1.95 cm^2. Valve area (Vmean): 1.98    cm^2.  - Mitral valve: Calcified annulus. Moderately thickened, moderately    calcified leaflets . There was mild regurgitation.  - Left atrium: The atrium was mildly dilated.  - Atrial septum: No defect or patent foramen ovale was identified.    Laboratory Data:  High Sensitivity Troponin:   Recent Labs  Lab 11/12/20 1037 11/12/20 1237  TROPONINIHS 13 14     Chemistry Recent Labs  Lab 11/12/20 1037  NA 139  K 3.5  CL 108  CO2 24  GLUCOSE 139*  BUN 12  CREATININE 1.24  CALCIUM 8.7*  GFRNONAA 57*  ANIONGAP 7    Recent Labs  Lab  11/12/20 1237  PROT 5.5*  ALBUMIN 3.1*  AST 22  ALT 19  ALKPHOS 56  BILITOT 0.5   Hematology Recent Labs  Lab 11/12/20 1037  WBC 5.4  RBC 3.54*  HGB 10.9*  HCT 34.5*  MCV 97.5  MCH 30.8  MCHC 31.6  RDW 13.0  PLT 291   BNPNo results for input(s): BNP, PROBNP in the last 168 hours.  DDimer No results for input(s): DDIMER in the last 168 hours.   Radiology/Studies:  DG Chest 2 View  Result Date: 11/12/2020 CLINICAL DATA:  Chest pain and shortness of breath EXAM: CHEST - 2 VIEW COMPARISON:  10/23/2020 FINDINGS: Cardiac shadow is stable. Postsurgical changes and prior pacing device are again noted. Lungs are well aerated bilaterally. Minimal right basilar atelectasis is seen. Small right-sided pleural effusion is noted. Postsurgical changes in the right shoulder are seen. IMPRESSION: Mild right basilar atelectasis and associated small effusion. Electronically Signed   By: Inez Catalina M.D.   On: 11/12/2020 11:11     Assessment and Plan:   1. Chest pain in patient with CAD s/p CABG: patient recently underwent LHC showing severe native disease with patent grafts - LIMA to LAD, SVG-OM3, and SVG-dRCA. Recommended for ongoing medical management. He presents again with chest pain. HsTrop negative x2. EKG with A-sensed, V-paced rhythm. CXR with atelectasis. Seems to be stable angina.  - Okay for discharge home from a cardiology perspective.  - Will increased imdur to 70m daily - SL  nitro Rx renewed  - Will stop aspirin and plavix due to need for anticoagulation going forward.  - Previously recommended for a repeat echocardiogram outpatient, though this does not appear to have been ordered - will place order to evaluate LV function  2. Paroxysmal atrial fibrillation: recently diagnosed on device interrogation 10/16/20. Recommended to start eliquis, though does not appear a prescription was sent. Query whether this is contributing to his chest pain episodes.  - Will increase metoprolol  succinate  - Will start apixaban 82m BID - Will place order for outpatient echocardiogram  3. Chronic diastolic CHF: recent concern for LV systolic dysfunction on NST 07/2020 which suggested EF 37%. LV gram not performed at the time of LHC to minimize contrast exposure. No volume overload complaints and he appears euvolemic on exam.  - Will place order for outpatient echocardiogram  4. HTN: BP significantly elevated this admission despite taking home medications this morning. Metoprolol and imdur are being increased as above - Patient instructed to keep a log of his blood pressures to bring to his follow-up visit to determine if further medication titrations should occur - low threshold to increase amlodipine which may also help with anginal symptoms - Continue enalapril, metoprolol, and imdur  5. Valvulopathy: known mild AS, verified on cath last week, though he does have a murmur appreciated at the apex.  - Will follow-up echocardiogram results to re-evaluate.   Risk Assessment/Risk Scores:        :[id number]}  HEAR Score (for undifferentiated chest pain):  HEAR Score: 4  New York Heart Association (NYHA) Functional Class NYHA Class II  CHA2DS2-VASc Score = 7  This indicates a 11.2% annual risk of stroke. The patient's score is based upon: CHF History: Yes HTN History: Yes Diabetes History: No Stroke History: Yes Vascular Disease History: Yes Age Score: 2 Gender Score: 0    [facility] will sign off.   Medication Recommendations:  As above Other recommendations (labs, testing, etc):  Echo outpatient Follow up as an outpatient:  Dr. HPercival Spanish3/31/22 as previously scheduled.   For questions or updates, please contact [place] consult [email or web address] for contact info under    Signed, KAbigail Butts PA-C  11/12/2020 4:30 PM

## 2020-11-19 DIAGNOSIS — R072 Precordial pain: Secondary | ICD-10-CM | POA: Insufficient documentation

## 2020-11-19 DIAGNOSIS — I4819 Other persistent atrial fibrillation: Secondary | ICD-10-CM | POA: Insufficient documentation

## 2020-11-19 NOTE — Progress Notes (Signed)
Cardiology Office Note   Date:  11/20/2020   ID:  Blake Burgess, Blake Burgess Feb 21, 1932, MRN [id number]  PCP:  Raelene Bott, MD  Cardiologist:   Minus Breeding, MD   Chief Complaint  Patient presents with  . Shortness of Breath      History of Present Illness: Blake Burgess is a 87 y.o. male who presents for follow up of CAD and CABG. In 2012 he did have an abnormal stress test followed by catheterization which demonstrated patent bypass grafts. His last stress in 2017 was unremarkable. In Feb 2019 he had chest pain and SOB and came to the ED and was noted to be in CHB. He had a pacemaker placed. During that admission he had positive enzymes so after an office visit a stress test was ordered. There was mild ischemia in the apex with an EF of 42%. This was low risk so he was managed medically.  He saw Dr. Margaretann Loveless as an add on recently for evaluation of chest pain.  He was in [facility] in mid Nov and was felt to have an acute exacerbation of HF.   EF on echo was 45% and CT was negative for PE.  Enzymes were negative.  He was treated with IV Lasix.  He was sent for a perfusion study.  There was no evidence of ischemia.   He was hospitalized at [facility] recently with chest pain.  I saw him and sent him for a cardiac cath.  He had patent grafts as below.  However, he still complains of chest pain daily.  He has a difficult time quantifying qualifying.  Is on his left side towards his left axilla.  He is also describing chronic dyspnea on exertion.  He is not having PND or orthopnea.  He says he is breathless with doing any activities.  He has not had any episodes of acute distress.  There have been no episodes such as that which brought him to [facility] when he was noted to be in atrial fibrillation.  He is not having any palpitations.  Past Medical History:  Diagnosis Date  . Aortic stenosis    mild AS 09/2017 echo  . Cancer ([place])    skin  . Coronary artery disease    a.  s/p CABG;    b. cath 4/12: EF 55%, 3vCAD, patent L-LAD, patent S-RCA, patent S-CFX (done after a false pos. ETT)  . Diverticular disease   . GERD (gastroesophageal reflux disease)   . GI bleed   . Hemorrhoids   . HH (hiatus hernia)   . History of kidney stones   . Hypertension   . Osteoarthritis   . Other and unspecified hyperlipidemia   . Presence of permanent cardiac pacemaker   . Schatzki's ring   . Stroke [facility])     Past Surgical History:  Procedure Laterality Date  . ARTERIOVENOUS GRAFT PLACEMENT W/ ENDOSCOPIC VEIN HARVEST     of the right leg greater spahenous vein. Surgeon: Tharon Aquas Trigt,M.D.  . COLONOSCOPY  02/24/2010   Hemorrhoids, Diverticulosis. Performed at [place]. Normal terminal ileum. Dr. June Leap, [place] GRAFT  06/21/2007   CABG x 3 Surgeon Ivin Poot, MD  . EYE SURGERY     bilateral cataract removal  . hip replace  06/09/2004   left hip Surgeon Pietro Cassis. Alvan Dame, MD  . LEFT HEART CATH AND CORS/GRAFTS ANGIOGRAPHY N/A 11/04/2020   Procedure: LEFT HEART CATH AND CORS/GRAFTS ANGIOGRAPHY;  Surgeon: Troy Sine, MD;  Location: [place] CV LAB;  Service: Cardiovascular;  Laterality: N/A;  . PACEMAKER IMPLANT N/A 10/03/2017   Procedure: PACEMAKER IMPLANT;  Surgeon: Evans Lance, MD;  Location: [place] CV LAB;  Service: Cardiovascular;  Laterality: N/A;  . REVERSE SHOULDER ARTHROPLASTY Right 08/25/2018   Procedure: REVERSE SHOULDER ARTHROPLASTY;  Surgeon: Netta Cedars, MD;  Location: [place];  Service: Orthopedics;  Laterality: Right;     Current Outpatient Medications  Medication Sig Dispense Refill  . acetaminophen (TYLENOL) 500 MG tablet Take 1,000 mg by mouth every 6 (six) hours as needed for moderate pain.    Marland Kitchen amLODipine (NORVASC) 2.5 MG tablet Take 2.5 mg by mouth daily.    Marland Kitchen apixaban (ELIQUIS) 5 MG TABS tablet Take 1 tablet (5 mg total) by mouth 2 (two) times daily. 60 tablet 3  . aspirin EC 81 MG tablet Take 81 mg by mouth daily.  Swallow whole.    Marland Kitchen atorvastatin (LIPITOR) 40 MG tablet Take 40 mg by mouth daily.    . Cyanocobalamin 1000 MCG/ML KIT Inject 1,000 mcg as directed every 30 (thirty) days.    Marland Kitchen donepezil (ARICEPT) 10 MG tablet Take 10 mg by mouth at bedtime.    . enalapril (VASOTEC) 20 MG tablet Take 20 mg by mouth 2 (two) times daily.    . Lidocaine 4 % PTCH Apply 1 patch topically daily as needed (pain).    . metoprolol succinate (TOPROL-XL) 50 MG 24 hr tablet Take 1 tablet (50 mg total) by mouth daily. 90 tablet 3  . nitroGLYCERIN (NITROSTAT) 0.4 MG SL tablet Place 1 tablet (0.4 mg total) under the tongue every 5 (five) minutes as needed for chest pain. 25 tablet 3  . omeprazole (PRILOSEC OTC) 20 MG tablet Take 1 tablet (20 mg total) by mouth daily. 28 tablet 1  . tamsulosin (FLOMAX) 0.4 MG CAPS capsule Take 0.4 mg by mouth daily.    . isosorbide mononitrate (IMDUR) 60 MG 24 hr tablet Take 1 tablet (60 mg total) by mouth daily. 90 tablet 3   No current facility-administered medications for this visit.    Allergies:   Patient has no known allergies.    ROS:  Please see the history of present illness.   Otherwise, review of systems are positive for none.   All other systems are reviewed and negative.    PHYSICAL EXAM: VS:  BP (!) 128/52 (BP Location: Left Arm, Patient Position: Sitting)   Pulse 61   Ht 5' 9"  (1.753 m)   Wt 170 lb 3.2 oz (77.2 kg)   SpO2 98%   BMI 25.13 kg/m  , BMI Body mass index is 25.13 kg/m.  GENERAL:  Well appearing NECK:  No jugular venous distention, waveform within normal limits, carotid upstroke brisk and symmetric, no bruits, no thyromegaly LUNGS:  Clear to auscultation bilaterally CHEST:  Well healed sternotomy scar. HEART:  PMI not displaced or sustained,S1 and S2 within normal limits, no S3, no S4, no clicks, no rubs, 3 out of 6 systolic murmur radiating at the aortic outflow tract, no diastolic murmurs ABD:  Flat, positive bowel sounds normal in frequency in pitch, no  bruits, no rebound, no guarding, no midline pulsatile mass, no hepatomegaly, no splenomegaly EXT:  2 plus pulses mild to moderate bilateral leg swelling.no cyanosis no clubbing   EKG:  EKG is not ordered today. EKG 07/14/20  AV paced rhythm.   Diagnostic cath Dominance: Right     Recent Labs:  11/12/2020: ALT 19; BUN 12; Creatinine, Ser 1.24; Hemoglobin 10.9; Platelets 291; Potassium 3.5; Sodium 139    Lipid Panel    Component Value Date/Time   CHOL 112 10/01/2017 0619   TRIG 68 10/01/2017 0619   HDL 39 (L) 10/01/2017 0619   CHOLHDL 2.9 10/01/2017 0619   VLDL 14 10/01/2017 0619   LDLCALC 59 10/01/2017 0619      Wt Readings from Last 3 Encounters:  11/20/20 170 lb 3.2 oz (77.2 kg)  11/04/20 170 lb (77.1 kg)  10/31/20 172 lb 12.8 oz (78.4 kg)      Other studies Reviewed: Additional studies/ records that were reviewed today include: Outside records in [place] Review of the above records demonstrates:  Please see elsewhere in the note.     ASSESSMENT AND PLAN:   CHB/PACEMAKER PLACEMENT:    He is up to date with follow up.    ATRIAL FIB:  He seems to have new atrial fib.  He was started on Eliquis.  I increased his beta-blocker.  He is in sinus rhythm.  No change in therapy.  CHEST PAIN:  .  He had patent grafts as described.  I did review the films with the patient in the room.  He certainly could be having still angina with his small vessel disease and branch vessel disease.  However, it is probably not anginal pain.  Regardless I am going to increase his Imdur to 60 mg daily.   HTN: His blood pressure is at target.  I increase his beta-blocker.  No change in therapy.   CHRONIC SYSTOLIC AND DIASTOLIC HF:    Seems to be euvolemic.  No change in therapy.  AS: He had mild AS on echo in [place] in 2021.  I will follow this clinically.   DOE: His oxygen saturation walking around the office today was 96%.    Current medicines are reviewed at length with  the patient today.  The patient does not have concerns regarding medicines.  The following changes have been made:    Labs/ tests ordered today include:    Orders Placed This Encounter  Procedures  . EKG 12-Lead     Disposition:   FU with me or APP in 3 months.    Signed, Minus Breeding, MD  11/20/2020 11:20 AM    [place]

## 2020-11-20 ENCOUNTER — Encounter: Payer: Self-pay | Admitting: Cardiology

## 2020-11-20 ENCOUNTER — Other Ambulatory Visit: Payer: Self-pay

## 2020-11-20 ENCOUNTER — Ambulatory Visit: Payer: Medicare HMO | Admitting: Cardiology

## 2020-11-20 VITALS — BP 128/52 | HR 61 | Ht 69.0 in | Wt 170.2 lb

## 2020-11-20 DIAGNOSIS — I10 Essential (primary) hypertension: Secondary | ICD-10-CM | POA: Diagnosis not present

## 2020-11-20 DIAGNOSIS — I4819 Other persistent atrial fibrillation: Secondary | ICD-10-CM | POA: Diagnosis not present

## 2020-11-20 DIAGNOSIS — I5042 Chronic combined systolic (congestive) and diastolic (congestive) heart failure: Secondary | ICD-10-CM | POA: Diagnosis not present

## 2020-11-20 DIAGNOSIS — R072 Precordial pain: Secondary | ICD-10-CM | POA: Diagnosis not present

## 2020-11-20 MED ORDER — ISOSORBIDE MONONITRATE ER 60 MG PO TB24: 60.0000 mg | ORAL_TABLET | Freq: Every day | ORAL | 3 refills | Status: DC

## 2020-11-20 NOTE — Patient Instructions (Signed)
Medication Instructions:  INCREASE- Isosorbide 60 mg by mouth daily  *If you need a refill on your cardiac medications before your next appointment, please call your pharmacy*   Lab Work: None Ordered   Testing/Procedures: None Ordered   Follow-Up: At [facility], you and your health needs are our priority.  As part of our continuing mission to provide you with exceptional heart care, we have created designated Provider Care Teams.  These Care Teams include your primary Cardiologist (physician) and Advanced Practice Providers (APPs -  Physician Assistants and Nurse Practitioners) who all work together to provide you with the care you need, when you need it.  We recommend signing up for the patient portal called "MyChart".  Sign up information is provided on this After Visit Summary.  MyChart is used to connect with patients for Virtual Visits (Telemedicine).  Patients are able to view lab/test results, encounter notes, upcoming appointments, etc.  Non-urgent messages can be sent to your provider as well.   To learn more about what you can do with MyChart, go to [email or web address].    Your next appointment:   4 month(s)  The format for your next appointment:   In Person  Provider:   You may see Minus Breeding, MD or one of the following Advanced Practice Providers on your designated Care Team:    Rosaria Ferries, PA-C  Jory Sims, DNP, ANP

## 2020-12-10 ENCOUNTER — Ambulatory Visit (HOSPITAL_COMMUNITY): Payer: Medicare HMO | Attending: Cardiovascular Disease

## 2020-12-10 ENCOUNTER — Other Ambulatory Visit: Payer: Self-pay

## 2020-12-10 DIAGNOSIS — I480 Paroxysmal atrial fibrillation: Secondary | ICD-10-CM | POA: Insufficient documentation

## 2020-12-10 LAB — ECHOCARDIOGRAM COMPLETE
AR max vel: 1.04 cm2
AV Area VTI: 1.09 cm2
AV Area mean vel: 0.98 cm2
AV Mean grad: 21.9 mmHg
AV Peak grad: 41.5 mmHg
Ao pk vel: 3.22 m/s
Area-P 1/2: 1.83 cm2
S' Lateral: 4.3 cm

## 2021-01-01 NOTE — Progress Notes (Signed)
Cardiology Office Note   Date:  01/02/2021   ID:  Blake Burgess, DOB 12-Nov-1931, MRN [id number]  PCP:  Raelene Bott, MD  Cardiologist:   Minus Breeding, MD   Chief Complaint  Patient presents with  . Shortness of Breath      History of Present Illness: Blake Burgess is a 87 y.o. male who presents for follow up of CAD and CABG. In 2012 he did have an abnormal stress test followed by catheterization which demonstrated patent bypass grafts. His last stress in 2017 was unremarkable. In Feb 2019 he had chest pain and SOB and came to the ED and was noted to be in CHB. He had a pacemaker placed. During that admission he had positive enzymes so after an office visit a stress test was ordered. There was mild ischemia in the apex with an EF of 42%. This was low risk so he was managed medically.  He saw Dr. Margaretann Loveless as an add on recently for evaluation of chest pain.  He was in [facility] in mid Nov and was felt to have an acute exacerbation of HF.   EF on echo was 45% and CT was negative for PE.  Enzymes were negative.  He was treated with IV Lasix.  He was sent for a perfusion study.  There was no evidence of ischemia.   He was hospitalized at [facility] recently with chest pain.  I saw him and sent him for a cardiac cath.  He had patent grafts as below.  He was again in the [facility] ED in March for chest pain.  There was no evidence of ischemia at that time.  I sent him for cardiac catheterization with results as below in March.  He had patent grafts.  He has had some intermittent atrial fibrillation.  He has had heart block and pacemaker.  I checked this with the device nurses today and he seems to be in this rhythm less than 1% of the time.  It does not correlate with the symptoms described below.  Of note I see that he was again in the hospital in late April at [facility] with chest pain.  I reviewed these records and there was no objective evidence of ischemia and he was not admitted.  Most  recently he has had an echocardiogram.  This suggest that his EF is 45 to 50% which is about the same or perhaps mildly reduced from below.  There were no regional wall motion abnormalities.  He has some mild mitral vegetation.  He has severe thickening of his aortic valve his mean gradient is 21.9.  This was on April 20.  This certainly has progressed since 2019 and the EF might be very slightly lower.  He comes today with his son who is quite appropriate and attentive.  He says his father still short of breath even sometimes at rest.  He has intermittent chest discomfort.  He wants to get out and do a lot of activities and is not able.  He is not describing PND or orthopnea.  He is not having any chest pressure, neck or arm discomfort.  His son says that sometimes he just sits and feels uncomfortable.  He says he looks breathless with activities and he is an active guy who seems to have declined.  Past Medical History:  Diagnosis Date  . Aortic stenosis    mild AS 09/2017 echo  . Cancer ([place])    skin  . Coronary artery  disease    a.  s/p CABG;   b. cath 4/12: EF 55%, 3vCAD, patent L-LAD, patent S-RCA, patent S-CFX (done after a false pos. ETT)  . Diverticular disease   . GERD (gastroesophageal reflux disease)   . GI bleed   . Hemorrhoids   . HH (hiatus hernia)   . History of kidney stones   . Hypertension   . Osteoarthritis   . Other and unspecified hyperlipidemia   . Presence of permanent cardiac pacemaker   . Schatzki's ring   . Stroke [facility])     Past Surgical History:  Procedure Laterality Date  . ARTERIOVENOUS GRAFT PLACEMENT W/ ENDOSCOPIC VEIN HARVEST     of the right leg greater spahenous vein. Surgeon: Blake Aquas Trigt,M.D.  . COLONOSCOPY  02/24/2010   Hemorrhoids, Diverticulosis. Performed at [facility]. Normal terminal ileum. Dr. June Leap, [place] GRAFT  06/21/2007   CABG x 3 Surgeon Ivin Poot, MD  . EYE SURGERY     bilateral cataract removal  .  hip replace  06/09/2004   left hip Surgeon Pietro Cassis. Alvan Dame, MD  . LEFT HEART CATH AND CORS/GRAFTS ANGIOGRAPHY N/A 11/04/2020   Procedure: LEFT HEART CATH AND CORS/GRAFTS ANGIOGRAPHY;  Surgeon: Troy Sine, MD;  Location: [place] [facility];  Service: Cardiovascular;  Laterality: N/A;  . PACEMAKER IMPLANT N/A 10/03/2017   Procedure: PACEMAKER IMPLANT;  Surgeon: Evans Lance, MD;  Location: [place] [facility];  Service: Cardiovascular;  Laterality: N/A;  . REVERSE SHOULDER ARTHROPLASTY Right 08/25/2018   Procedure: REVERSE SHOULDER ARTHROPLASTY;  Surgeon: Netta Cedars, MD;  Location: [facility];  Service: Orthopedics;  Laterality: Right;     Current Outpatient Medications  Medication Sig Dispense Refill  . acetaminophen (TYLENOL) 500 MG tablet Take 1,000 mg by mouth every 6 (six) hours as needed for moderate pain.    Marland Kitchen amLODipine (NORVASC) 2.5 MG tablet Take 2.5 mg by mouth daily.    Marland Kitchen apixaban (ELIQUIS) 5 MG TABS tablet Take 1 tablet (5 mg total) by mouth 2 (two) times daily. 60 tablet 3  . aspirin EC 81 MG tablet Take 81 mg by mouth daily. Swallow whole.    Marland Kitchen atorvastatin (LIPITOR) 40 MG tablet Take 40 mg by mouth daily.    . Cyanocobalamin 1000 MCG/ML KIT Inject 1,000 mcg as directed every 30 (thirty) days.    Marland Kitchen donepezil (ARICEPT) 10 MG tablet Take 10 mg by mouth at bedtime.    . enalapril (VASOTEC) 20 MG tablet Take 20 mg by mouth 2 (two) times daily.    . furosemide (LASIX) 40 MG tablet Take 40 mg by mouth. 1 Tablet Daily    . isosorbide mononitrate (IMDUR) 60 MG 24 hr tablet Take 1 tablet (60 mg total) by mouth daily. 90 tablet 3  . Lidocaine 4 % PTCH Apply 1 patch topically daily as needed (pain).    . metoprolol succinate (TOPROL-XL) 50 MG 24 hr tablet Take 1 tablet (50 mg total) by mouth daily. 90 tablet 3  . nitroGLYCERIN (NITROSTAT) 0.4 MG SL tablet Place 1 tablet (0.4 mg total) under the tongue every 5 (five) minutes as needed for chest pain. 25 tablet 3  . omeprazole (PRILOSEC  OTC) 20 MG tablet Take 1 tablet (20 mg total) by mouth daily. 28 tablet 1  . tamsulosin (FLOMAX) 0.4 MG CAPS capsule Take 0.4 mg by mouth daily.     No current facility-administered medications for this visit.    Allergies:  Patient has no known allergies.    ROS:  Please see the history of present illness.   Otherwise, review of systems are positive for none .   All other systems are reviewed and negative.    PHYSICAL EXAM: VS:  BP 118/62   Pulse 60   Ht 5' 9"  (1.753 m)   Wt 164 lb (74.4 kg)   SpO2 96%   BMI 24.22 kg/m  , BMI Body mass index is 24.22 kg/m.  GENERAL:  Well appearing NECK:  No jugular venous distention, waveform within normal limits, carotid upstroke brisk and symmetric, no bruits, no thyromegaly LUNGS:  Clear to auscultation bilaterally CHEST:  Well healed sternotomy scar. HEART:  PMI not displaced or sustained,S1 and S2 within normal limits, no S3, no S4, no clicks, no rubs, 3 out of 6 mid-to-late peaking systolic murmur radiating out the aortic outflow tract, no diastolic murmurs ABD:  Flat, positive bowel sounds normal in frequency in pitch, no bruits, no rebound, no guarding, no midline pulsatile mass, no hepatomegaly, no splenomegaly EXT:  2 plus pulses throughout, no edema, no cyanosis no clubbing    EKG:  EKG is not ordered today. .   Diagnostic cath Dominance: Right     Recent Labs: 11/12/2020: ALT 19; BUN 12; Creatinine, Ser 1.24; Hemoglobin 10.9; Platelets 291; Potassium 3.5; Sodium 139    Lipid Panel    Component Value Date/Time   CHOL 112 10/01/2017 0619   TRIG 68 10/01/2017 0619   HDL 39 (L) 10/01/2017 0619   CHOLHDL 2.9 10/01/2017 0619   VLDL 14 10/01/2017 0619   LDLCALC 59 10/01/2017 0619      Wt Readings from Last 3 Encounters:  01/02/21 164 lb (74.4 kg)  11/20/20 170 lb 3.2 oz (77.2 kg)  11/04/20 170 lb (77.1 kg)      Other studies Reviewed: Additional studies/ records that were reviewed today include: Extensive review  of care everywhere in [place] records Review of the above records demonstrates:  Please see elsewhere in the note.     ASSESSMENT AND PLAN:   CHB/PACEMAKER PLACEMENT:    He is up to date with follow up.   We pulled up the most recent interrogation from February 22.  He has normal device function.  ATRIAL FIB:      Blake Burgess has a CHA2DS2 - VASc score of 5.  He tolerates anticoagulation.  His blood work is up-to-date.  He is on the appropriate dose.  He has had no bleeding issues.  He has had no symptomatic paroxysms.  He has rare paroxysms  CHEST PAIN:  .  He had patent grafts as described above.  This could be anginal but requiring medical management.  He is not currently having progressive symptoms.  No change in therapy.  He had patent bypass grafts as described  HTN: His blood pressure is at target.  No change in therapy.   CHRONIC SYSTOLIC AND DIASTOLIC HF:    Seems to be euvolemic.  Continue the meds as listed.  AS: He had progressive AS.  I spoke with the patient and his son at length.  Certainly his symptoms could be related to this aortic stenosis which may be severe with a slightly lower ejection fraction.  I think he be a reasonable candidate for TAVR if he needs the other criteria and his son would be very interested in pursuing this and the patient tentatively agrees so I will schedule a follow-up with the structural heart team.  DOE:   I am going to check pulmonary function tests but suspect the most likely etiology would be in his valve.  There is been no other clear etiology.  He has been satting at 96%.   Current medicines are reviewed at length with the patient today.  The patient does not have concerns regarding medicines.  The following changes have been made:    Labs/ tests ordered today include:    Orders Placed This Encounter  Procedures  . Ambulatory referral to Structural Heart/Valve Clinic (only at [place])  . Pulmonary function test      Disposition:   FU with me or APP in 3 months.    Signed, Minus Breeding, MD  01/02/2021 2:40 PM    [facility]

## 2021-01-02 ENCOUNTER — Telehealth: Payer: Self-pay | Admitting: Cardiology

## 2021-01-02 ENCOUNTER — Other Ambulatory Visit: Payer: Self-pay

## 2021-01-02 ENCOUNTER — Encounter: Payer: Self-pay | Admitting: Cardiology

## 2021-01-02 ENCOUNTER — Ambulatory Visit: Payer: Medicare HMO | Admitting: Cardiology

## 2021-01-02 VITALS — BP 118/62 | HR 60 | Ht 69.0 in | Wt 164.0 lb

## 2021-01-02 DIAGNOSIS — R0602 Shortness of breath: Secondary | ICD-10-CM

## 2021-01-02 DIAGNOSIS — I442 Atrioventricular block, complete: Secondary | ICD-10-CM

## 2021-01-02 DIAGNOSIS — I10 Essential (primary) hypertension: Secondary | ICD-10-CM | POA: Diagnosis not present

## 2021-01-02 DIAGNOSIS — I4891 Unspecified atrial fibrillation: Secondary | ICD-10-CM

## 2021-01-02 DIAGNOSIS — I350 Nonrheumatic aortic (valve) stenosis: Secondary | ICD-10-CM | POA: Diagnosis not present

## 2021-01-02 DIAGNOSIS — R072 Precordial pain: Secondary | ICD-10-CM

## 2021-01-02 DIAGNOSIS — I5022 Chronic systolic (congestive) heart failure: Secondary | ICD-10-CM | POA: Diagnosis not present

## 2021-01-02 NOTE — Telephone Encounter (Signed)
Spoke with son--Blake Burgess (on DPR)) regarding the Wednesday 01/14/2210:00 am Pulmonary Function appointment at [facility]--arrival time isi 10:30 am--1st floor admissions office for check in--COVID screening scheduled Monday 01/12/21 at 11:40 am---[address]---Blake Burgess voiced his understanding.

## 2021-01-02 NOTE — Patient Instructions (Signed)
  Testing/Procedures:  PULMONARY FUNCTION TEST AT [place]: At [facility], you and your health needs are our priority.  As part of our continuing mission to provide you with exceptional heart care, we have created designated Provider Care Teams.  These Care Teams include your primary Cardiologist (physician) and Advanced Practice Providers (APPs -  Physician Assistants and Nurse Practitioners) who all work together to provide you with the care you need, when you need it.  We recommend signing up for the patient portal called "MyChart".  Sign up information is provided on this After Visit Summary.  MyChart is used to connect with patients for Virtual Visits (Telemedicine).  Patients are able to view lab/test results, encounter notes, upcoming appointments, etc.  Non-urgent messages can be sent to your provider as well.   To learn more about what you can do with MyChart, go to [email or web address].    Your next appointment:   AFTER YOU SEE THE STRUCTURAL HEART CLINIC  The format for your next appointment:   In Person  Provider:   Minus Breeding, MD

## 2021-01-12 ENCOUNTER — Other Ambulatory Visit (HOSPITAL_COMMUNITY)
Admission: RE | Admit: 2021-01-12 | Discharge: 2021-01-12 | Disposition: A | Discharge status: Home / Self Care | Payer: Medicare HMO | Source: Ambulatory Visit | Attending: Cardiovascular Disease | Admitting: Cardiovascular Disease

## 2021-01-12 ENCOUNTER — Other Ambulatory Visit (HOSPITAL_COMMUNITY): Payer: Medicare HMO

## 2021-01-12 ENCOUNTER — Encounter: Payer: Self-pay | Admitting: Cardiovascular Disease

## 2021-01-12 ENCOUNTER — Other Ambulatory Visit: Payer: Self-pay

## 2021-01-12 ENCOUNTER — Ambulatory Visit: Payer: Medicare HMO | Admitting: Cardiovascular Disease

## 2021-01-12 VITALS — BP 136/70 | HR 63 | Ht 69.0 in | Wt 163.0 lb

## 2021-01-12 DIAGNOSIS — Z01812 Encounter for preprocedural laboratory examination: Secondary | ICD-10-CM | POA: Insufficient documentation

## 2021-01-12 DIAGNOSIS — I350 Nonrheumatic aortic (valve) stenosis: Secondary | ICD-10-CM | POA: Diagnosis not present

## 2021-01-12 DIAGNOSIS — Z20822 Contact with and (suspected) exposure to covid-19: Secondary | ICD-10-CM | POA: Diagnosis not present

## 2021-01-12 LAB — BASIC METABOLIC PANEL
BUN/Creatinine Ratio: 18 (ref 10–24)
BUN: 24 mg/dL (ref 8–27)
CO2: 24 mmol/L (ref 20–29)
Calcium: 8.9 mg/dL (ref 8.6–10.2)
Chloride: 100 mmol/L (ref 96–106)
Creatinine, Ser: 1.37 mg/dL — ABNORMAL HIGH (ref 0.76–1.27)
Glucose: 116 mg/dL — ABNORMAL HIGH (ref 65–99)
Potassium: 4.4 mmol/L (ref 3.5–5.2)
Sodium: 137 mmol/L (ref 134–144)
eGFR: 50 mL/min/{1.73_m2} — ABNORMAL LOW (ref >59)

## 2021-01-12 LAB — SARS CORONAVIRUS 2 (TAT 6-24 HRS): SARS Coronavirus 2: NEGATIVE

## 2021-01-12 NOTE — H&P (View-Only) (Signed)
[facility] Consult Note  Chief Complaint  Patient presents with  . New Patient (Initial Visit)    Severe aortic stenosis   History of Present Illness: 87 yo male with history of HTN, paroxysmal atrial fibrillation, CAD s/p 3V CABG, complete heart block s/p permanent pacemaker placement in 2019, ischemic cardiomyopathy, chronic systolic CHF, GERD, hiatal hernia, prior CVA and aortic stenosis who is here today as a new consult, referred by Dr. Percival Spanish, for further discussion regarding his aortic stenosis and possible TAVR. He is known to have CAD and underwent 3V CABG in 2008. Most recent cardiac cath March 2022 with severe three vessel CAD with all three patent grafts. Echo April 2022 with LVEF=45-50%. Mild mitral regurgitation. The aortic valve is thickened and calcified with limited leaflet excursion. Mean gradient 21.9 mmHg, peak gradient 41.53mHg, AVA 0.98 cm2, dimensionless index 0.24. This is consistent with low flow, low gradient severe aortic stenosis. He has a permanent pacemaker in place. He has PAF and is on Eliquis.   He tells me today that he continues to have dyspnea with minimal exertion. He has occasional chest pains. Some dizziness. He walks with a cane. [place] 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 He has poor dentition and does not see a dentist.   Primary Care Physician: HRaelene Bott MD Primary Cardiologist: Hochrein Referring Cardiologist: HPercival Spanish Past Medical History:  Diagnosis Date  . Aortic stenosis    mild AS 09/2017 echo  . [place] [place]  . Coronary artery disease    a.  s/p CABG;   b. cath 4/12: EF 55%, 3vCAD, patent L-LAD, patent S-RCA, patent S-CFX (done after a false pos. ETT)  . Diverticular disease   . GERD (gastroesophageal reflux disease)   . GI bleed   . Hemorrhoids   . HH (hiatus hernia)   . History of kidney stones   . Hypertension   . Osteoarthritis   . Other  and unspecified hyperlipidemia   . Presence of permanent cardiac pacemaker   . Schatzki's ring   . Stroke ([facility]     Past Surgical History:  Procedure Laterality Date  . ARTERIOVENOUS GRAFT PLACEMENT W/ ENDOSCOPIC VEIN HARVEST     of the right leg greater spahenous vein. Surgeon: PTharon AquasTrigt,M.D.  . COLONOSCOPY  02/24/2010   Hemorrhoids, Diverticulosis. [place]. [place]. [place]  06/21/2007   CABG x 3 Surgeon PIvin Poot MD  . EYE SURGERY     bilateral cataract removal  . hip replace  06/09/2004   left hip Surgeon MPietro Cassis OAlvan Dame MD  . LEFT HEART CATH AND CORS/GRAFTS ANGIOGRAPHY N/A 11/04/2020   Procedure: LEFT HEART CATH AND CORS/GRAFTS ANGIOGRAPHY;  Surgeon: KTroy Sine MD;  [facility];  Service: Cardiovascular;  Laterality: N/A;  . PACEMAKER IMPLANT N/A 10/03/2017   Procedure: PACEMAKER IMPLANT;  Surgeon: TEvans Lance MD;  [place] [facility];  Service: Cardiovascular;  Laterality: N/A;  . REVERSE SHOULDER ARTHROPLASTY Right 08/25/2018   Procedure: REVERSE SHOULDER ARTHROPLASTY;  Surgeon: NNetta Cedars MD;  [place] [facility]  Service: Orthopedics;  Laterality: Right;    Current Outpatient Medications  Medication Sig Dispense Refill  . acetaminophen (TYLENOL) 500 MG tablet Take 1,000 mg by mouth every 6 (six) hours as needed for moderate pain.    .Marland Kitchen  amLODipine (NORVASC) 2.5 MG tablet Take 2.5 mg by mouth daily.    Marland Kitchen apixaban (ELIQUIS) 5 MG TABS tablet Take 1 tablet (5 mg total) by mouth 2 (two) times daily. 60 tablet 3  . aspirin EC 81 MG tablet Take 81 mg by mouth daily. Swallow whole.    Marland Kitchen atorvastatin (LIPITOR) 40 MG tablet Take 40 mg by mouth daily.    . Cyanocobalamin 1000 MCG/ML KIT Inject 1,000 mcg as directed every 30 (thirty) days.    Marland Kitchen donepezil (ARICEPT) 10 MG tablet Take 10 mg by mouth at bedtime.    . enalapril (VASOTEC) 20 MG tablet Take 20 mg by mouth 2  (two) times daily.    . furosemide (LASIX) 40 MG tablet Take 40 mg by mouth. 1 Tablet Daily    . isosorbide mononitrate (IMDUR) 60 MG 24 hr tablet Take 1 tablet (60 mg total) by mouth daily. 90 tablet 3  . Lidocaine 4 % PTCH Apply 1 patch topically daily as needed (pain).    . metoprolol succinate (TOPROL-XL) 50 MG 24 hr tablet Take 1 tablet (50 mg total) by mouth daily. 90 tablet 3  . nitroGLYCERIN (NITROSTAT) 0.4 MG SL tablet Place 1 tablet (0.4 mg total) under the tongue every 5 (five) minutes as needed for chest pain. 25 tablet 3  . omeprazole (PRILOSEC OTC) 20 MG tablet Take 1 tablet (20 mg total) by mouth daily. 28 tablet 1  . tamsulosin (FLOMAX) 0.4 MG CAPS capsule Take 0.4 mg by mouth daily.     No current facility-administered medications for this visit.    No Known Allergies  Social History   Socioeconomic History  . Marital status: Married    Spouse name: Not on file  . Number of children: 2  . Years of education: Not on file  . Highest education level: Not on file  Occupational History  . Occupation: Development worker, community: RETIRED  Tobacco Use  . Smoking status: Never Smoker  . Smokeless tobacco: Never Used  Vaping Use  . Vaping Use: Never used  Substance and Sexual Activity  . Alcohol use: No  . Drug use: No  . Sexual activity: Not on file  Other Topics Concern  . Not on file  Social History Narrative   No Regular exercise. Daily Caffeine: 24 oz pepsi and 1 cup coffee.    Social Determinants of Health   Financial Resource Strain: Not on file  Food Insecurity: Not on file  Transportation Needs: Not on file  Physical Activity: Not on file  Stress: Not on file  Social Connections: Not on file  Intimate Partner Violence: Not on file    Family History  Problem Relation Age of Onset  . Heart attack Mother   . Hypertension Mother   . Diabetes Father   . Diabetes Brother   . Diabetes Sister     Review of Systems:  As stated in the HPI and otherwise  negative.   BP 136/70   Pulse 63   Ht _0  (1.753 m)   Wt 163 lb (73.9 kg)   SpO2 97%   BMI 24.07 kg/m   Physical Examination: General: Well developed, well nourished, NAD  HEENT: OP clear, mucus membranes moist  SKIN: warm, dry. No rashes. Neuro: No focal deficits  Musculoskeletal: Muscle strength 5/5 all ext  Psychiatric: Mood and affect normal  Neck: No JVD, no carotid bruits, no thyromegaly, no lymphadenopathy.  Lungs:Clear bilaterally, no wheezes, rhonci, crackles Cardiovascular: Regular rate  and rhythm. Loud, harsh, late peaking systolic murmur.  Abdomen:Soft. Bowel sounds present. Non-tender.  Extremities: No lower extremity edema. Pulses are 2 + in the bilateral DP/PT.  EKG:  EKG is not ordered today. The ekg ordered today demonstrates   Echo 12/10/20:  1. Left ventricular ejection fraction, by estimation, is 45 to 50%. The  left ventricle has mildly decreased function. The left ventricle  demonstrates regional wall motion abnormalities (see scoring  diagram/findings for description). Left ventricular  diastolic parameters are consistent with Grade II diastolic dysfunction  (pseudonormalization). Elevated left atrial pressure. There is moderate  hypokinesis of the left ventricular, basal inferior wall and inferolateral  wall.  2. Right ventricular systolic function is normal. The right ventricular  size is normal. There is normal pulmonary artery systolic pressure. The  estimated right ventricular systolic pressure is 27.0 mmHg.  3. Left atrial size was moderately dilated.  4. Right atrial size was moderately dilated.  5. The mitral valve is normal in structure. Mild mitral valve  regurgitation.  6. The aortic valve is tricuspid. There is severe calcifcation of the  aortic valve. There is severe thickening of the aortic valve. Aortic valve  regurgitation is not visualized. Moderate to severe aortic valve stenosis.  7. The inferior vena cava is normal in  size with greater than 50%  respiratory variability, suggesting right atrial pressure of 3 mmHg.   Comparison(s): A prior study was performed on 10/01/2017. Prior images  reviewed side by side. The left ventricular function is worsened. The left  ventricular diastolic function is significantly worse. The left  ventricular wall motion abnormality is  worse. Aortic stenosis has worsened.   FINDINGS  Left Ventricle: Left ventricular ejection fraction, by estimation, is 45  to 50%. The left ventricle has mildly decreased function. The left  ventricle demonstrates regional wall motion abnormalities. Moderate  hypokinesis of the left ventricular, basal  inferior wall and inferolateral wall. 3D left ventricular ejection  fraction analysis performed but not reported based on interpreter  judgement due to suboptimal quality. The left ventricular internal cavity  size was normal in size. There is no left  ventricular hypertrophy. Left ventricular diastolic parameters are  consistent with Grade II diastolic dysfunction (pseudonormalization).  Elevated left atrial pressure.   Right Ventricle: The right ventricular size is normal. No increase in  right ventricular wall thickness. Right ventricular systolic function is  normal. There is normal pulmonary artery systolic pressure. The tricuspid  regurgitant velocity is 2.64 m/s, and  with an assumed right atrial pressure of 3 mmHg, the estimated right  ventricular systolic pressure is 62.3 mmHg.   Left Atrium: Left atrial size was moderately dilated.   Right Atrium: Right atrial size was moderately dilated.   Pericardium: There is no evidence of pericardial effusion.   Mitral Valve: The mitral valve is normal in structure. Mild mitral annular  calcification. Mild mitral valve regurgitation, with centrally-directed  jet. MV peak gradient, 7.4 mmHg. The mean mitral valve gradient is 2.0  mmHg.   Tricuspid Valve: The tricuspid valve is normal  in structure. Tricuspid  valve regurgitation is trivial.   Aortic Valve: The aortic valve is tricuspid. There is severe calcifcation  of the aortic valve. There is severe thickening of the aortic valve.  Aortic valve regurgitation is not visualized. Moderate to severe aortic  stenosis is present. Aortic valve mean  gradient measures 21.9 mmHg. Aortic valve peak gradient measures 41.5  mmHg. Aortic valve area, by VTI measures 1.09 cm.  Pulmonic Valve: The pulmonic valve was grossly normal. Pulmonic valve  regurgitation is not visualized.   Aorta: The aortic root and ascending aorta are structurally normal, with  no evidence of dilitation.   Venous: The inferior vena cava is normal in size with greater than 50%  respiratory variability, suggesting right atrial pressure of 3 mmHg.   IAS/Shunts: No atrial level shunt detected by color flow Doppler.   Additional Comments: A device lead is visualized in the right ventricle  and right atrium.     LEFT VENTRICLE  PLAX 2D  LVIDd:     5.30 cm Diastology  LVIDs:     4.30 cm LV e' medial:  5.55 cm/s  LV PW:     0.80 cm LV E/e' medial: 21.0  LV IVS:    0.70 cm LV e' lateral:  7.18 cm/s  LVOT diam:   2.40 cm LV E/e' lateral: 16.2  LV SV:     91  LV SV Index:  47  LVOT Area:   4.52 cm               3D Volume EF:             3D EF:    57 %             LV EDV:    198 ml             LV ESV:    85 ml             LV SV:    113 ml   RIGHT VENTRICLE  RV S prime:   12.10 cm/s  TAPSE (M-mode): 1.8 cm   LEFT ATRIUM       Index    RIGHT ATRIUM      Index  LA diam:    4.20 cm 2.18 cm/m RA Area:   23.00 cm  LA Vol (A2C):  107.0 ml 55.47 ml/m RA Volume:  68.60 ml 35.56 ml/m  LA Vol (A4C):  71.7 ml 37.17 ml/m  LA Biplane Vol: 95.7 ml 49.61 ml/m  AORTIC VALVE  AV Area (Vmax):  1.04 cm  AV  Area (Vmean):  0.98 cm  AV Area (VTI):   1.09 cm  AV Vmax:      322.15 cm/s  AV Vmean:     236.827 cm/s  AV VTI:      0.835 m  AV Peak Grad:   41.5 mmHg  AV Mean Grad:   21.9 mmHg  LVOT Vmax:     73.81 cm/s  LVOT Vmean:    51.433 cm/s  LVOT VTI:     0.202 m  LVOT/AV VTI ratio: 0.24    AORTA  Ao Root diam: 3.80 cm  Ao Asc diam: 3.50 cm   MITRAL VALVE        TRICUSPID VALVE  MV Area (PHT): cm     TR Peak grad:  27.9 mmHg  MV Peak grad: 7.4 mmHg   TR Vmax:    264.00 cm/s  MV Mean grad: 2.0 mmHg  MV Vmax:    1.36 m/s   SHUNTS  MV Vmean:   65.0 cm/s  Systemic VTI: 0.20 m  MV Decel Time: 414 msec   Systemic Diam: 2.40 cm  MV E velocity: 116.50 cm/s  MV A velocity: 111.00 cm/s  MV E/A ratio: 1.05   Cardiac cath 11/04/20:   Mid LM to Prox LAD lesion is 95% stenosed.  Mid LAD lesion is 100%  stenosed.  Dist LAD lesion is 25% stenosed.  Ost Cx to Prox Cx lesion is 80% stenosed.  Mid Cx lesion is 90% stenosed.  2nd Mrg lesion is 50% stenosed.  Prox RCA lesion is 95% stenosed.  Mid RCA lesion is 100% stenosed.   Severe native coronary calcification and multivessel CAD without left main and a common ostium giving  rise to a severely calcified proximal LAD with 95% stenosis and total occlusion of the LAD after a prominent septal perforating artery; and 80% ostial calcified circumflex stenosis followed by 90% proximal to mid circumflex stenosis.  There is competitive filling of the distal circumflex via the SVG supplying the OM 3 vessel.  Total mid occlusion of the native RCA.  Patent LIMA graft supplying the mid LAD with the distal LAD being a small caliber vessel.  Patent SVG supplying the OM 3 vessel of the circumflex coronary artery.  Patent SVG supplying the distal RCA.  Mild aortic valve stenosis with a 17 mm peak gradient on LV to AO pullback.  RECOMMENDATION: Increase medical therapy  with possible further titration of amlodipine and beta-blocker therapy.  Recommend a follow-up 2D echo Doppler study.  The patient will follow up with Dr. Percival Spanish.   Recommendations  Antiplatelet/Anticoag Recommend Aspirin 15m daily for moderate CAD.   Indications  Coronary artery disease involving native coronary artery of native heart with angina pectoris ([facility] [I25.119 (ICD-10-CM)]   Procedural Details  Technical Details Mr. COrange Hilligossis an 87year old patient of Dr. HPercival Spanish  He underwent CABG revascularization surgery in 2008 by Dr. VDarcey Noraand had a LIMA placed to his LAD, and SVG to his OM 3 vessel and SVG to his RCA.  Catheterization in 2012 showed severe disease of the ostial/proximal LAD and circumflex vessel with patent grafts.  He is status post permanent pacemaker placement.  He has mild aortic stenosis noted at his last echo Doppler study in 2019.  [facility]  He is referred by Dr. HPercival Spanishfor definitive cardiac catheterization.  Patient arrived to the catheterization laboratory in the fasting state.  He was hypertensive on arrival.  He was premedicated with Versed 1 mg and fentanyl 25 mcg.  Ultrasound guidance was utilized.  His right femoral artery was punctured anteriorly and a 5 French sheath was inserted without difficulty.  Diagnostic catheterization was done with Judkins 4 left and right coronary catheters.  The right catheter was used for selective angiography into both vein grafts as well as the left internal mammary artery.  The RCA catheter was advanced into the left ventricle for left ventricular pressure recording and hand-injection left ventriculography.  LV to AO pullback was performed.  Hemostasis was obtained by direct manual pressure.  The patient tolerated the procedure well. Estimated blood loss <50 mL.   During this procedure medications were administered to achieve and maintain  moderate conscious sedation while the patient's heart rate, blood pressure, and oxygen saturation were continuously monitored and I was present face-to-face 100% of this time.   Medications (Filter: Administrations occurring from 1025 to 1147 on 11/04/20)  Heparin (Porcine) in NaCl 1000-0.9 UT/500ML-% SOLN (mL) Total volume:  1,000 mL  Date/Time Rate/Dose/Volume Action   11/04/20 1038 500 mL Given   1038 500 mL Given    midazolam (VERSED) injection (mg) Total dose:  1 mg  Date/Time Rate/Dose/Volume Action   11/04/20 1049 1 mg Given    fentaNYL (SUBLIMAZE) injection (mcg) Total dose:  25 mcg  Date/Time Rate/Dose/Volume Action   11/04/20 1049 25 mcg Given    lidocaine (PF) (XYLOCAINE) 1 % injection (mL) Total volume:  13 mL  Date/Time Rate/Dose/Volume Action   11/04/20 1105 13 mL Given    iohexol (OMNIPAQUE) 350 MG/ML injection (mL) Total volume:  115 mL  Date/Time Rate/Dose/Volume Action   11/04/20 1140 115 mL Given     Sedation Time  Sedation Time Physician-1: 46 minutes 33 seconds   Contrast  Medication Name Total Dose  iohexol (OMNIPAQUE) 350 MG/ML injection 115 mL    Radiation/Fluoro  Fluoro time: 9.5 (min) DAP: 18.1 (Gycm2) Cumulative Air Kerma: 300.4 (mGy)   Coronary Findings   Diagnostic Dominance: Right  Left Main  Mid LM to Prox LAD lesion is 95% stenosed.  Left Anterior Descending  Mid LAD lesion is 100% stenosed.  Dist LAD lesion is 25% stenosed.  First Diagonal Branch  Vessel is small in size.  Left Circumflex  Ost Cx to Prox Cx lesion is 80% stenosed.  Mid Cx lesion is 90% stenosed.  First Obtuse Marginal Branch  Vessel is small in size.  Second Obtuse Marginal Branch  2nd Mrg lesion is 50% stenosed.  Right Coronary Artery  Prox RCA lesion is 95% stenosed.  Mid RCA lesion is 100% stenosed.  Right Ventricular Branch  Vessel is small in size.  LIMA Graft To Mid LAD  Graft To 3rd Mrg  Graft To Dist RCA   Intervention   No  interventions have been documented.  Left Heart  Left Ventricle Mild LV dysfunction with EF estimated 45 to 50% with suggestion of very mild distal inferior hypocontractility.  Pullback pressure:  LV: 147/18 AO: 130/52   Coronary Diagrams   Diagnostic Dominance: Right    Intervention    Implants    No implant documentation for this case.    Syngo Images  Show images for CARDIAC CATHETERIZATION  Images on Long Term Storage  Show images for Zaid, Tomes to Procedure Log  Procedure Log     Hemo Data  Flowsheet Row Most Recent Value  AO Systolic Pressure 161 mmHg  AO Diastolic Pressure 49 mmHg  AO Mean 85 mmHg  LV Systolic Pressure 096 mmHg  LV Diastolic Pressure 1 mmHg  LV EDP 24 mmHg  AOp Systolic Pressure 045 mmHg  AOp Diastolic Pressure 52 mmHg  AOp Mean Pressure 84 mmHg  LVp Systolic Pressure 409 mmHg  LVp Diastolic Pressure 15 mmHg  LVp EDP Pressure 18 mmHg     Recent Labs: 11/12/2020: ALT 19; BUN 12; Creatinine, Ser 1.24; Hemoglobin 10.9; Platelets 291; Potassium 3.5; Sodium 139    Wt Readings from Last 3 Encounters:  01/12/21 163 lb (73.9 kg)  01/02/21 164 lb (74.4 kg)  11/20/20 170 lb 3.2 oz (77.2 kg)     Other studies Reviewed: Additional studies/ records that were reviewed today include: echo images, cath images, office notes. Review of the above records demonstrates: severe AS  STS Risk Score: Risk of Mortality: 2.391% Renal Failure: 1.743% Permanent Stroke: 1.465% Prolonged Ventilation: 8.013% DSW Infection: 0.124% Reoperation: 3.722% Morbidity or Mortality: 12.871% Short Length of Stay: 22.063% Long Length of Stay: 8.367%  Assessment and Plan:   1. Severe Aortic Valve Stenosis: He has severe, stage D2 aortic valve stenosis. I have personally reviewed the echo images. The aortic valve is thickened, calcified with limited leaflet mobility. I think he would benefit from AVR. Given advanced age, he is not a  good candidate for  conventional AVR by surgical approach. I think he may be a good candidate for TAVR.   I have reviewed the natural history of aortic stenosis with the patient and their family members  who are present today. We have discussed the limitations of medical therapy and the poor prognosis associated with symptomatic aortic stenosis. We have reviewed potential treatment options, including palliative medical therapy, conventional surgical aortic valve replacement, and transcatheter aortic valve replacement. We discussed treatment options in the context of the patient's specific comorbid medical conditions.   He would like to proceed with planning for TAVR. Risks and benefits of the valve procedure are reviewed with the patient. I will arrange a cardiac CT, CTA of the chest/abdomen and pelvis, carotid artery dopplers, PT assessment and he will then be referred to see one of the CT surgeons on our TAVR team.   He will need a dental consult prior to his TAVR  BMET today     Current medicines are reviewed at length with the patient today.  The patient does not have concerns regarding medicines.  The following changes have been made:  no change  Labs/ tests ordered today include:   Orders Placed This Encounter  Procedures  . Basic metabolic panel     Disposition:   F/U with the valve team.    Signed, Lauree Chandler, MD 01/12/2021 1:34 PM    [address], [place], [place]  [address] Phone: [phone]; Fax: [phone]

## 2021-01-12 NOTE — Patient Instructions (Addendum)
Medication Instructions:  No changes *If you need a refill on your cardiac medications before your next appointment, please call your pharmacy*   Lab Work: Today: BMET  If you have labs (blood work) drawn today and your tests are completely normal, you will receive your results only by: Marland Kitchen MyChart Message (if you have MyChart) OR . A paper copy in the mail If you have any lab test that is abnormal or we need to change your treatment, we will call you to review the results.   Testing/Procedures: As planned.  Theodosia Quay, RN Structural Heart Nurse Navigator will be contacting you.

## 2021-01-12 NOTE — Progress Notes (Addendum)
[facility]ural [facility] Clinic Consult Note  Chief Complaint  Patient presents with  . New Patient (Initial Visit)    Severe aortic stenosis   History of Present Illness: 87 yo male with history of HTN, paroxysmal atrial fibrillation, CAD s/p 3V CABG, complete heart block s/p permanent pacemaker placement in 2019, ischemic cardiomyopathy, chronic systolic CHF, GERD, hiatal hernia, prior CVA and aortic stenosis who is here today as a new consult, referred by Dr. Percival Spanish, for further discussion regarding his aortic stenosis and possible TAVR. He is known to have CAD and underwent 3V CABG in 2008. Most recent cardiac cath March 2022 with severe three vessel CAD with all three patent grafts. Echo April 2022 with LVEF=45-50%. Mild mitral regurgitation. The aortic valve is thickened and calcified with limited leaflet excursion. Mean gradient 21.9 mmHg, peak gradient 41.53mHg, AVA 0.98 cm2, dimensionless index 0.24. This is consistent with low flow, low gradient severe aortic stenosis. He has a permanent pacemaker in place. He has PAF and is on Eliquis.   He tells me today that he continues to have dyspnea with minimal exertion. He has occasional chest pains. Some dizziness. He walks with a cane. [place] 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 He has poor dentition and does not see a dentist.   Primary Care Physician: HRaelene Bott MD Primary Cardiologist: Hochrein Referring Cardiologist: HPercival Spanish Past Medical History:  Diagnosis Date  . Aortic stenosis    mild AS 09/2017 echo  . [place]  . Coronary artery disease    a.  s/p CABG;   b. cath 4/12: EF 55%, 3vCAD, patent L-LAD, patent S-RCA, patent S-CFX (done after a false pos. ETT)  . Diverticular disease   . GERD (gastroesophageal reflux disease)   . GI bleed   . Hemorrhoids   . HH (hiatus hernia)   . History of kidney stones   . Hypertension   . Osteoarthritis   . Other  and unspecified hyperlipidemia   . Presence of permanent cardiac pacemaker   . Schatzki's ring   . Stroke ([facility]     Past Surgical History:  Procedure Laterality Date  . ARTERIOVENOUS GRAFT PLACEMENT W/ ENDOSCOPIC VEIN HARVEST     of the right leg greater spahenous vein. Surgeon: PTharon AquasTrigt,M.D.  . COLONOSCOPY  02/24/2010   Hemorrhoids, Diverticulosis. [place]. [place]. [place]  06/21/2007   CABG x 3 Surgeon PIvin Poot MD  . EYE SURGERY     bilateral cataract removal  . hip replace  06/09/2004   left hip Surgeon MPietro Cassis OAlvan Dame MD  . LEFT HEART CATH AND CORS/GRAFTS ANGIOGRAPHY N/A 11/04/2020   Procedure: LEFT HEART CATH AND CORS/GRAFTS ANGIOGRAPHY;  Surgeon: KTroy Sine MD;  [facility];  Service: Cardiovascular;  Laterality: N/A;  . PACEMAKER IMPLANT N/A 10/03/2017   Procedure: PACEMAKER IMPLANT;  Surgeon: TEvans Lance MD;  [place] [facility];  Service: Cardiovascular;  Laterality: N/A;  . REVERSE SHOULDER ARTHROPLASTY Right 08/25/2018   Procedure: REVERSE SHOULDER ARTHROPLASTY;  Surgeon: NNetta Cedars MD;  [place] [facility]  Service: Orthopedics;  Laterality: Right;    Current Outpatient Medications  Medication Sig Dispense Refill  . acetaminophen (TYLENOL) 500 MG tablet Take 1,000 mg by mouth every 6 (six) hours as needed for moderate pain.    .Marland Kitchen  amLODipine (NORVASC) 2.5 MG tablet Take 2.5 mg by mouth daily.    . apixaban (ELIQUIS) 5 MG TABS tablet Take 1 tablet (5 mg total) by mouth 2 (two) times daily. 60 tablet 3  . aspirin EC 81 MG tablet Take 81 mg by mouth daily. Swallow whole.    . atorvastatin (LIPITOR) 40 MG tablet Take 40 mg by mouth daily.    . Cyanocobalamin 1000 MCG/ML KIT Inject 1,000 mcg as directed every 30 (thirty) days.    . donepezil (ARICEPT) 10 MG tablet Take 10 mg by mouth at bedtime.    . enalapril (VASOTEC) 20 MG tablet Take 20 mg by mouth 2  (two) times daily.    . furosemide (LASIX) 40 MG tablet Take 40 mg by mouth. 1 Tablet Daily    . isosorbide mononitrate (IMDUR) 60 MG 24 hr tablet Take 1 tablet (60 mg total) by mouth daily. 90 tablet 3  . Lidocaine 4 % PTCH Apply 1 patch topically daily as needed (pain).    . metoprolol succinate (TOPROL-XL) 50 MG 24 hr tablet Take 1 tablet (50 mg total) by mouth daily. 90 tablet 3  . nitroGLYCERIN (NITROSTAT) 0.4 MG SL tablet Place 1 tablet (0.4 mg total) under the tongue every 5 (five) minutes as needed for chest pain. 25 tablet 3  . omeprazole (PRILOSEC OTC) 20 MG tablet Take 1 tablet (20 mg total) by mouth daily. 28 tablet 1  . tamsulosin (FLOMAX) 0.4 MG CAPS capsule Take 0.4 mg by mouth daily.     No current facility-administered medications for this visit.    No Known Allergies  Social History   Socioeconomic History  . Marital status: Married    Spouse name: Not on file  . Number of children: 2  . Years of education: Not on file  . Highest education level: Not on file  Occupational History  . 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 Smoking status: Never Smoker  . Smokeless tobacco: Never Used  Vaping Use  . Vaping Use: Never used  Substance and Sexual Activity  . Alcohol use: No  . Drug use: No  . Sexual activity: Not on file  Other Topics Concern  . Not on file  Social History Narrative   No Regular exercise. Daily Caffeine: 24 oz pepsi and 1 cup coffee.    Social Determinants of Health   Financial Resource Strain: Not on file  Food Insecurity: Not on file  Transportation Needs: Not on file  Physical Activity: Not on file  Stress: Not on file  Social Connections: Not on file  Intimate Partner Violence: Not on file    Family History  Problem Relation Age of Onset  . Heart attack Mother   . Hypertension Mother   . Diabetes Father   . Diabetes Brother   . Diabetes Sister     Review of Systems:  As stated in the HPI and otherwise  negative.   BP 136/70   Pulse 63   Ht 5' 9" (1.753 m)   Wt 163 lb (73.9 kg)   SpO2 97%   BMI 24.07 kg/m   Physical Examination: General: Well developed, well nourished, NAD  HEENT: OP clear, mucus membranes moist  SKIN: warm, dry. No rashes. Neuro: No focal deficits  Musculoskeletal: Muscle strength 5/5 all ext  Psychiatric: Mood and affect normal  Neck: No JVD, no carotid bruits, no thyromegaly, no lymphadenopathy.  Lungs:Clear bilaterally, no wheezes, rhonci, crackles Cardiovascular: Regular rate   and rhythm. Loud, harsh, late peaking systolic murmur.  Abdomen:Soft. Bowel sounds present. Non-tender.  Extremities: No lower extremity edema. Pulses are 2 + in the bilateral DP/PT.  EKG:  EKG is not ordered today. The ekg ordered today demonstrates   Echo 12/10/20:  1. Left ventricular ejection fraction, by estimation, is 45 to 50%. The  left ventricle has mildly decreased function. The left ventricle  demonstrates regional wall motion abnormalities (see scoring  diagram/findings for description). Left ventricular  diastolic parameters are consistent with Grade II diastolic dysfunction  (pseudonormalization). Elevated left atrial pressure. There is moderate  hypokinesis of the left ventricular, basal inferior wall and inferolateral  wall.  2. Right ventricular systolic function is normal. The right ventricular  size is normal. There is normal pulmonary artery systolic pressure. The  estimated right ventricular systolic pressure is 27.0 mmHg.  3. Left atrial size was moderately dilated.  4. Right atrial size was moderately dilated.  5. The mitral valve is normal in structure. Mild mitral valve  regurgitation.  6. The aortic valve is tricuspid. There is severe calcifcation of the  aortic valve. There is severe thickening of the aortic valve. Aortic valve  regurgitation is not visualized. Moderate to severe aortic valve stenosis.  7. The inferior vena cava is normal in  size with greater than 50%  respiratory variability, suggesting right atrial pressure of 3 mmHg.   Comparison(s): A prior study was performed on 10/01/2017. Prior images  reviewed side by side. The left ventricular function is worsened. The left  ventricular diastolic function is significantly worse. The left  ventricular wall motion abnormality is  worse. Aortic stenosis has worsened.   FINDINGS  Left Ventricle: Left ventricular ejection fraction, by estimation, is 45  to 50%. The left ventricle has mildly decreased function. The left  ventricle demonstrates regional wall motion abnormalities. Moderate  hypokinesis of the left ventricular, basal  inferior wall and inferolateral wall. 3D left ventricular ejection  fraction analysis performed but not reported based on interpreter  judgement due to suboptimal quality. The left ventricular internal cavity  size was normal in size. There is no left  ventricular hypertrophy. Left ventricular diastolic parameters are  consistent with Grade II diastolic dysfunction (pseudonormalization).  Elevated left atrial pressure.   Right Ventricle: The right ventricular size is normal. No increase in  right ventricular wall thickness. Right ventricular systolic function is  normal. There is normal pulmonary artery systolic pressure. The tricuspid  regurgitant velocity is 2.64 m/s, and  with an assumed right atrial pressure of 3 mmHg, the estimated right  ventricular systolic pressure is 62.3 mmHg.   Left Atrium: Left atrial size was moderately dilated.   Right Atrium: Right atrial size was moderately dilated.   Pericardium: There is no evidence of pericardial effusion.   Mitral Valve: The mitral valve is normal in structure. Mild mitral annular  calcification. Mild mitral valve regurgitation, with centrally-directed  jet. MV peak gradient, 7.4 mmHg. The mean mitral valve gradient is 2.0  mmHg.   Tricuspid Valve: The tricuspid valve is normal  in structure. Tricuspid  valve regurgitation is trivial.   Aortic Valve: The aortic valve is tricuspid. There is severe calcifcation  of the aortic valve. There is severe thickening of the aortic valve.  Aortic valve regurgitation is not visualized. Moderate to severe aortic  stenosis is present. Aortic valve mean  gradient measures 21.9 mmHg. Aortic valve peak gradient measures 41.5  mmHg. Aortic valve area, by VTI measures 1.09 cm.  Pulmonic Valve: The pulmonic valve was grossly normal. Pulmonic valve  regurgitation is not visualized.   Aorta: The aortic root and ascending aorta are structurally normal, with  no evidence of dilitation.   Venous: The inferior vena cava is normal in size with greater than 50%  respiratory variability, suggesting right atrial pressure of 3 mmHg.   IAS/Shunts: No atrial level shunt detected by color flow Doppler.   Additional Comments: A device lead is visualized in the right ventricle  and right atrium.     LEFT VENTRICLE  PLAX 2D  LVIDd:     5.30 cm Diastology  LVIDs:     4.30 cm LV e' medial:  5.55 cm/s  LV PW:     0.80 cm LV E/e' medial: 21.0  LV IVS:    0.70 cm LV e' lateral:  7.18 cm/s  LVOT diam:   2.40 cm LV E/e' lateral: 16.2  LV SV:     91  LV SV Index:  47  LVOT Area:   4.52 cm               3D Volume EF:             3D EF:    57 %             LV EDV:    198 ml             LV ESV:    85 ml             LV SV:    113 ml   RIGHT VENTRICLE  RV S prime:   12.10 cm/s  TAPSE (M-mode): 1.8 cm   LEFT ATRIUM       Index    RIGHT ATRIUM      Index  LA diam:    4.20 cm 2.18 cm/m RA Area:   23.00 cm  LA Vol (A2C):  107.0 ml 55.47 ml/m RA Volume:  68.60 ml 35.56 ml/m  LA Vol (A4C):  71.7 ml 37.17 ml/m  LA Biplane Vol: 95.7 ml 49.61 ml/m  AORTIC VALVE  AV Area (Vmax):  1.04 cm  AV  Area (Vmean):  0.98 cm  AV Area (VTI):   1.09 cm  AV Vmax:      322.15 cm/s  AV Vmean:     236.827 cm/s  AV VTI:      0.835 m  AV Peak Grad:   41.5 mmHg  AV Mean Grad:   21.9 mmHg  LVOT Vmax:     73.81 cm/s  LVOT Vmean:    51.433 cm/s  LVOT VTI:     0.202 m  LVOT/AV VTI ratio: 0.24    AORTA  Ao Root diam: 3.80 cm  Ao Asc diam: 3.50 cm   MITRAL VALVE        TRICUSPID VALVE  MV Area (PHT): cm     TR Peak grad:  27.9 mmHg  MV Peak grad: 7.4 mmHg   TR Vmax:    264.00 cm/s  MV Mean grad: 2.0 mmHg  MV Vmax:    1.36 m/s   SHUNTS  MV Vmean:   65.0 cm/s  Systemic VTI: 0.20 m  MV Decel Time: 414 msec   Systemic Diam: 2.40 cm  MV E velocity: 116.50 cm/s  MV A velocity: 111.00 cm/s  MV E/A ratio: 1.05   Cardiac cath 11/04/20:   Mid LM to Prox LAD lesion is 95% stenosed.  Mid LAD lesion is 100%  stenosed.  Dist LAD lesion is 25% stenosed.  Ost Cx to Prox Cx lesion is 80% stenosed.  Mid Cx lesion is 90% stenosed.  2nd Mrg lesion is 50% stenosed.  Prox RCA lesion is 95% stenosed.  Mid RCA lesion is 100% stenosed.   Severe native coronary calcification and multivessel CAD without left main and a common ostium giving  rise to a severely calcified proximal LAD with 95% stenosis and total occlusion of the LAD after a prominent septal perforating artery; and 80% ostial calcified circumflex stenosis followed by 90% proximal to mid circumflex stenosis.  There is competitive filling of the distal circumflex via the SVG supplying the OM 3 vessel.  Total mid occlusion of the native RCA.  Patent LIMA graft supplying the mid LAD with the distal LAD being a small caliber vessel.  Patent SVG supplying the OM 3 vessel of the circumflex coronary artery.  Patent SVG supplying the distal RCA.  Mild aortic valve stenosis with a 17 mm peak gradient on LV to AO pullback.  RECOMMENDATION: Increase medical therapy  with possible further titration of amlodipine and beta-blocker therapy.  Recommend a follow-up 2D echo Doppler study.  The patient will follow up with Dr. Hochrein.   Recommendations  Antiplatelet/Anticoag Recommend Aspirin 81mg daily for moderate CAD.   Indications  Coronary artery disease involving native coronary artery of native heart with angina pectoris (HCC) [I25.119 (ICD-10-CM)]   Procedural Details  Technical Details Mr. Tery Mathers is an 88-year-old patient of Dr. Hochrein.  He underwent CABG revascularization surgery in 2008 by Dr. Vantrigt and had a LIMA placed to his LAD, and SVG to his OM 3 vessel and SVG to his RCA.  Catheterization in 2012 showed severe disease of the ostial/proximal LAD and circumflex vessel with patent grafts.  He is status post permanent pacemaker placement.  He has mild aortic stenosis noted at his last echo Doppler study in 2019.  He had recently developed episodic chest pain and was hospitalized at [facility].  He is referred by Dr. Hochrein for definitive cardiac catheterization.  Patient arrived to the catheterization laboratory in the fasting state.  He was hypertensive on arrival.  He was premedicated with Versed 1 mg and fentanyl 25 mcg.  Ultrasound guidance was utilized.  His right femoral artery was punctured anteriorly and a 5 French sheath was inserted without difficulty.  Diagnostic catheterization was done with Judkins 4 left and right coronary catheters.  The right catheter was used for selective angiography into both vein grafts as well as the left internal mammary artery.  The RCA catheter was advanced into the left ventricle for left ventricular pressure recording and hand-injection left ventriculography.  LV to AO pullback was performed.  Hemostasis was obtained by direct manual pressure.  The patient tolerated the procedure well. Estimated blood loss <50 mL.   During this procedure medications were administered to achieve and maintain  moderate conscious sedation while the patient's heart rate, blood pressure, and oxygen saturation were continuously monitored and I was present face-to-face 100% of this time.   Medications (Filter: Administrations occurring from 1025 to 1147 on 11/04/20)  Heparin (Porcine) in NaCl 1000-0.9 UT/500ML-% SOLN (mL) Total volume:  1,000 mL  Date/Time Rate/Dose/Volume Action   11/04/20 1038 500 mL Given   1038 500 mL Given    midazolam (VERSED) injection (mg) Total dose:  1 mg  Date/Time Rate/Dose/Volume Action   11/04/20 1049 1 mg Given    fentaNYL (SUBLIMAZE) injection (mcg) Total dose:    25 mcg  Date/Time Rate/Dose/Volume Action   11/04/20 1049 25 mcg Given    lidocaine (PF) (XYLOCAINE) 1 % injection (mL) Total volume:  13 mL  Date/Time Rate/Dose/Volume Action   11/04/20 1105 13 mL Given    iohexol (OMNIPAQUE) 350 MG/ML injection (mL) Total volume:  115 mL  Date/Time Rate/Dose/Volume Action   11/04/20 1140 115 mL Given     Sedation Time  Sedation Time Physician-1: 46 minutes 33 seconds   Contrast  Medication Name Total Dose  iohexol (OMNIPAQUE) 350 MG/ML injection 115 mL    Radiation/Fluoro  Fluoro time: 9.5 (min) DAP: 18.1 (Gycm2) Cumulative Air Kerma: 300.4 (mGy)   Coronary Findings   Diagnostic Dominance: Right  Left Main  Mid LM to Prox LAD lesion is 95% stenosed.  Left Anterior Descending  Mid LAD lesion is 100% stenosed.  Dist LAD lesion is 25% stenosed.  First Diagonal Branch  Vessel is small in size.  Left Circumflex  Ost Cx to Prox Cx lesion is 80% stenosed.  Mid Cx lesion is 90% stenosed.  First Obtuse Marginal Branch  Vessel is small in size.  Second Obtuse Marginal Branch  2nd Mrg lesion is 50% stenosed.  Right Coronary Artery  Prox RCA lesion is 95% stenosed.  Mid RCA lesion is 100% stenosed.  Right Ventricular Branch  Vessel is small in size.  LIMA Graft To Mid LAD  Graft To 3rd Mrg  Graft To Dist RCA   Intervention   No  interventions have been documented.  Left Heart  Left Ventricle Mild LV dysfunction with EF estimated 45 to 50% with suggestion of very mild distal inferior hypocontractility.  Pullback pressure:  LV: 147/18 AO: 130/52   Coronary Diagrams   Diagnostic Dominance: Right    Intervention    Implants    No implant documentation for this case.    Syngo Images  Show images for CARDIAC CATHETERIZATION  Images on Long Term Storage  Show images for Zaid, Tomes to Procedure Log  Procedure Log     Hemo Data  Flowsheet Row Most Recent Value  AO Systolic Pressure 161 mmHg  AO Diastolic Pressure 49 mmHg  AO Mean 85 mmHg  LV Systolic Pressure 096 mmHg  LV Diastolic Pressure 1 mmHg  LV EDP 24 mmHg  AOp Systolic Pressure 045 mmHg  AOp Diastolic Pressure 52 mmHg  AOp Mean Pressure 84 mmHg  LVp Systolic Pressure 409 mmHg  LVp Diastolic Pressure 15 mmHg  LVp EDP Pressure 18 mmHg     Recent Labs: 11/12/2020: ALT 19; BUN 12; Creatinine, Ser 1.24; Hemoglobin 10.9; Platelets 291; Potassium 3.5; Sodium 139    Wt Readings from Last 3 Encounters:  01/12/21 163 lb (73.9 kg)  01/02/21 164 lb (74.4 kg)  11/20/20 170 lb 3.2 oz (77.2 kg)     Other studies Reviewed: Additional studies/ records that were reviewed today include: echo images, cath images, office notes. Review of the above records demonstrates: severe AS  STS Risk Score: Risk of Mortality: 2.391% Renal Failure: 1.743% Permanent Stroke: 1.465% Prolonged Ventilation: 8.013% DSW Infection: 0.124% Reoperation: 3.722% Morbidity or Mortality: 12.871% Short Length of Stay: 22.063% Long Length of Stay: 8.367%  Assessment and Plan:   1. Severe Aortic Valve Stenosis: He has severe, stage D2 aortic valve stenosis. I have personally reviewed the echo images. The aortic valve is thickened, calcified with limited leaflet mobility. I think he would benefit from AVR. Given advanced age, he is not a  good candidate for  conventional AVR by surgical approach. I think he may be a good candidate for TAVR.   I have reviewed the natural history of aortic stenosis with the patient and their family members  who are present today. We have discussed the limitations of medical therapy and the poor prognosis associated with symptomatic aortic stenosis. We have reviewed potential treatment options, including palliative medical therapy, conventional surgical aortic valve replacement, and transcatheter aortic valve replacement. We discussed treatment options in the context of the patient's specific comorbid medical conditions.   He would like to proceed with planning for TAVR. Risks and benefits of the valve procedure are reviewed with the patient. I will arrange a cardiac CT, CTA of the chest/abdomen and pelvis, carotid artery dopplers, PT assessment and he will then be referred to see one of the CT surgeons on our TAVR team.   He will need a dental consult prior to his TAVR  BMET today     Current medicines are reviewed at length with the patient today.  The patient does not have concerns regarding medicines.  The following changes have been made:  no change  Labs/ tests ordered today include:   Orders Placed This Encounter  Procedures  . Basic metabolic panel     Disposition:   F/U with the valve team.    Signed, Lauree Chandler, MD 01/12/2021 1:34 PM    [address], [place], [place]  [address] Phone: [phone]; Fax: [phone]

## 2021-01-14 ENCOUNTER — Ambulatory Visit: Payer: Medicare HMO

## 2021-01-14 ENCOUNTER — Other Ambulatory Visit: Payer: Self-pay

## 2021-01-14 ENCOUNTER — Ambulatory Visit (HOSPITAL_COMMUNITY)
Admission: RE | Admit: 2021-01-14 | Discharge: 2021-01-14 | Disposition: A | Discharge status: Home / Self Care | Payer: Medicare HMO | Source: Ambulatory Visit | Attending: Cardiology | Admitting: Cardiology

## 2021-01-14 DIAGNOSIS — R0602 Shortness of breath: Secondary | ICD-10-CM

## 2021-01-14 DIAGNOSIS — I442 Atrioventricular block, complete: Secondary | ICD-10-CM

## 2021-01-14 DIAGNOSIS — I350 Nonrheumatic aortic (valve) stenosis: Secondary | ICD-10-CM

## 2021-01-14 DIAGNOSIS — K089 Disorder of teeth and supporting structures, unspecified: Secondary | ICD-10-CM

## 2021-01-14 LAB — PULMONARY FUNCTION TEST
DL/VA % pred: 62 %
DL/VA: 2.39 ml/min/mmHg/L
DLCO unc % pred: 44 %
DLCO unc: 10.2 ml/min/mmHg
FEF 25-75 Post: 0.52 L/sec
FEF 25-75 Pre: 0.79 L/sec
FEF2575-%Change-Post: -34 %
FEF2575-%Pred-Post: 32 %
FEF2575-%Pred-Pre: 49 %
FEV1-%Change-Post: -22 %
FEV1-%Pred-Post: 40 %
FEV1-%Pred-Pre: 51 %
FEV1-Post: 1 L
FEV1-Pre: 1.29 L
FEV1FVC-%Change-Post: -25 %
FEV1FVC-%Pred-Pre: 64 %
FEV6-%Change-Post: -2 %
FEV6-%Pred-Post: 77 %
FEV6-%Pred-Pre: 79 %
FEV6-Post: 2.56 L
FEV6-Pre: 2.64 L
FEV6FVC-%Change-Post: -6 %
FEV6FVC-%Pred-Post: 93 %
FEV6FVC-%Pred-Pre: 100 %
FVC-%Change-Post: 3 %
FVC-%Pred-Post: 82 %
FVC-%Pred-Pre: 79 %
FVC-Post: 2.97 L
FVC-Pre: 2.85 L
Post FEV1/FVC ratio: 34 %
Post FEV6/FVC ratio: 86 %
Pre FEV1/FVC ratio: 45 %
Pre FEV6/FVC Ratio: 92 %
RV % pred: 151 %
RV: 4.14 L
TLC % pred: 102 %
TLC: 7.08 L

## 2021-01-14 MED ORDER — ALBUTEROL SULFATE (2.5 MG/3ML) 0.083% IN NEBU
2.5000 mg | INHALATION_SOLUTION | Freq: Once | RESPIRATORY_TRACT | Status: AC
  Administered 2021-01-14: 2.5 mg via RESPIRATORY_TRACT

## 2021-01-15 LAB — CUP PACEART REMOTE DEVICE CHECK
Battery Remaining Longevity: 79 mo
Battery Voltage: 2.97 V
Brady Statistic AP VP Percent: 34.77 %
Brady Statistic AP VS Percent: 0.01 %
Brady Statistic AS VP Percent: 59.92 %
Brady Statistic AS VS Percent: 5.3 %
Brady Statistic RA Percent Paced: 37.23 %
Brady Statistic RV Percent Paced: 94.68 %
Date Time Interrogation Session: 20220525012047
Implantable Lead Implant Date: 20190211
Implantable Lead Implant Date: 20190211
Implantable Lead Location: 753859
Implantable Lead Location: 753860
Implantable Lead Model: 3830
Implantable Lead Model: 5076
Implantable Pulse Generator Implant Date: 20190211
Lead Channel Impedance Value: 228 Ohm
Lead Channel Impedance Value: 342 Ohm
Lead Channel Impedance Value: 342 Ohm
Lead Channel Impedance Value: 494 Ohm
Lead Channel Pacing Threshold Amplitude: 0.625 V
Lead Channel Pacing Threshold Amplitude: 2.5 V
Lead Channel Pacing Threshold Pulse Width: 0.4 ms
Lead Channel Pacing Threshold Pulse Width: 0.4 ms
Lead Channel Sensing Intrinsic Amplitude: 1.875 mV
Lead Channel Sensing Intrinsic Amplitude: 1.875 mV
Lead Channel Sensing Intrinsic Amplitude: 3.625 mV
Lead Channel Sensing Intrinsic Amplitude: 3.625 mV
Lead Channel Setting Pacing Amplitude: 1.5 V
Lead Channel Setting Pacing Amplitude: 2.5 V
Lead Channel Setting Pacing Pulse Width: 0.4 ms
Lead Channel Setting Sensing Sensitivity: 0.6 mV

## 2021-01-16 ENCOUNTER — Other Ambulatory Visit: Payer: Self-pay | Admitting: *Deleted

## 2021-01-16 DIAGNOSIS — R942 Abnormal results of pulmonary function studies: Secondary | ICD-10-CM

## 2021-01-22 ENCOUNTER — Ambulatory Visit (INDEPENDENT_AMBULATORY_CARE_PROVIDER_SITE_OTHER): Payer: Medicare HMO | Admitting: Emergency Medicine

## 2021-01-22 ENCOUNTER — Other Ambulatory Visit: Payer: Self-pay

## 2021-01-22 ENCOUNTER — Telehealth: Payer: Self-pay

## 2021-01-22 DIAGNOSIS — I442 Atrioventricular block, complete: Secondary | ICD-10-CM | POA: Diagnosis not present

## 2021-01-22 LAB — CUP PACEART INCLINIC DEVICE CHECK
Battery Remaining Longevity: 79 mo
Battery Voltage: 2.97 V
Brady Statistic AP VP Percent: 22.86 %
Brady Statistic AP VS Percent: 0 %
Brady Statistic AS VP Percent: 73.5 %
Brady Statistic AS VS Percent: 3.64 %
Brady Statistic RA Percent Paced: 24.68 %
Brady Statistic RV Percent Paced: 96.36 %
Date Time Interrogation Session: 20220602164226
Implantable Lead Implant Date: 20190211
Implantable Lead Implant Date: 20190211
Implantable Lead Location: 753859
Implantable Lead Location: 753860
Implantable Lead Model: 3830
Implantable Lead Model: 5076
Implantable Pulse Generator Implant Date: 20190211
Lead Channel Impedance Value: 228 Ohm
Lead Channel Impedance Value: 3382 Ohm
Lead Channel Impedance Value: 3382 Ohm
Lead Channel Impedance Value: 342 Ohm
Lead Channel Pacing Threshold Amplitude: 0.75 V
Lead Channel Pacing Threshold Amplitude: 4 V
Lead Channel Pacing Threshold Pulse Width: 0.4 ms
Lead Channel Pacing Threshold Pulse Width: 0.4 ms
Lead Channel Sensing Intrinsic Amplitude: 0.875 mV
Lead Channel Sensing Intrinsic Amplitude: 1.375 mV
Lead Channel Setting Pacing Amplitude: 1.5 V
Lead Channel Setting Pacing Amplitude: 2.5 V
Lead Channel Setting Pacing Pulse Width: 0.4 ms
Lead Channel Setting Sensing Sensitivity: 0.6 mV

## 2021-01-22 NOTE — Telephone Encounter (Signed)
[facility]link alert- High ventricular threshold, this is not a new finding, on monitor only so threshold does not auto adjust, no safety margin.  Noise on the atrial lead, polarity switch to unipolar, lead impedance >3000 ohms.    Attempted to reach patient, no answer, LVM to call Device clinic back.   Pt son, [name] returned call, stated pt did not feel well all day yesterday.  Symptoms include CP, Dizziness and overall unwell feeling.    Today pt denies any cardiac symptoms.  Requested updated manual transmission, device gave error code 3230.    Pt son is able to bring patient into office today for interrogation/ testing.

## 2021-01-22 NOTE — Progress Notes (Signed)
Pacemaker check in clinic due to alert for elevated RA impedence. RA lead test result show potential lead fracture with impedence of >3000ohms unipolar and bilpolar. RA threshold returned 3.75 @0 .4 ms.  Lead does appear to be sensing P waves at 0.90mv.  Reviewed with Dr. Curt Bears in office and Dr. Jannet Askew via phone.  Device reprogrammed to DDD LRL 40 to keep AV synchrony.  RV Threshold, sensing, impedances consistent with previous measurements. Device programmed at appropriate safety margins. Histogram distribution appropriate for patient activity level. Estimated longevity 6.5 years. Patient enrolled in remote follow-up next scheduled check 04/15/21. Message sent scheudling to contact patient for in-clinic appt with Dr. Lovena Le to discuss possible lead extraction/ revision.  Patient education completed.

## 2021-01-23 ENCOUNTER — Telehealth: Payer: Self-pay

## 2021-01-23 NOTE — Telephone Encounter (Signed)
Left message on pt's voicemail in regards to next steps for TAVR evaluation.  Currently the pt is having issues with pacemaker and he is scheduled to see Dr Lovena Le on 6/7.  He also has a consult the same day with Dr Gershon Mussel for dental evaluation.   The structural heart team will hold off on arranging TAVR CT scans so that the pt can proceed with EP evaluation. I will continue to follow the pt's chart.

## 2021-01-27 ENCOUNTER — Ambulatory Visit: Payer: Medicare HMO | Admitting: Internal Medicine

## 2021-01-27 ENCOUNTER — Ambulatory Visit (INDEPENDENT_AMBULATORY_CARE_PROVIDER_SITE_OTHER): Payer: Medicare HMO | Admitting: Dentistry

## 2021-01-27 ENCOUNTER — Encounter (HOSPITAL_COMMUNITY): Payer: Self-pay | Admitting: Dentistry

## 2021-01-27 ENCOUNTER — Encounter: Payer: Self-pay | Admitting: Internal Medicine

## 2021-01-27 ENCOUNTER — Other Ambulatory Visit: Payer: Self-pay

## 2021-01-27 VITALS — BP 110/56 | HR 70 | Ht 69.0 in | Wt 162.4 lb

## 2021-01-27 DIAGNOSIS — I2510 Atherosclerotic heart disease of native coronary artery without angina pectoris: Secondary | ICD-10-CM | POA: Diagnosis not present

## 2021-01-27 DIAGNOSIS — I442 Atrioventricular block, complete: Secondary | ICD-10-CM

## 2021-01-27 DIAGNOSIS — K030 Excessive attrition of teeth: Secondary | ICD-10-CM

## 2021-01-27 DIAGNOSIS — K083 Retained dental root: Secondary | ICD-10-CM

## 2021-01-27 DIAGNOSIS — Z01818 Encounter for other preprocedural examination: Secondary | ICD-10-CM

## 2021-01-27 DIAGNOSIS — K06020 Generalized gingival recession, unspecified: Secondary | ICD-10-CM

## 2021-01-27 DIAGNOSIS — I519 Heart disease, unspecified: Secondary | ICD-10-CM

## 2021-01-27 DIAGNOSIS — Z950 Presence of cardiac pacemaker: Secondary | ICD-10-CM | POA: Diagnosis not present

## 2021-01-27 DIAGNOSIS — Z7901 Long term (current) use of anticoagulants: Secondary | ICD-10-CM | POA: Diagnosis not present

## 2021-01-27 DIAGNOSIS — K0530 Chronic periodontitis, unspecified: Secondary | ICD-10-CM

## 2021-01-27 DIAGNOSIS — K0850 Unsatisfactory restoration of tooth, unspecified: Secondary | ICD-10-CM

## 2021-01-27 DIAGNOSIS — K08109 Complete loss of teeth, unspecified cause, unspecified class: Secondary | ICD-10-CM

## 2021-01-27 DIAGNOSIS — K029 Dental caries, unspecified: Secondary | ICD-10-CM

## 2021-01-27 DIAGNOSIS — K045 Chronic apical periodontitis: Secondary | ICD-10-CM

## 2021-01-27 DIAGNOSIS — I5042 Chronic combined systolic (congestive) and diastolic (congestive) heart failure: Secondary | ICD-10-CM | POA: Diagnosis not present

## 2021-01-27 DIAGNOSIS — K032 Erosion of teeth: Secondary | ICD-10-CM

## 2021-01-27 NOTE — Patient Instructions (Addendum)
Medication Instructions:  Your physician recommends that you continue on your current medications as directed. Please refer to the Current Medication list given to you today.  Labwork: None ordered.  Testing/Procedures: None ordered.  Follow-Up: Your physician wants you to follow-up in: October 2022 with Cristopher Peru, MD   Remote monitoring is used to monitor your Pacemaker from home. This monitoring reduces the number of office visits required to check your device to one time per year. It allows [place] to keep an eye on the functioning of your device to ensure it is working properly. You are scheduled for a device check from home on 04/15/2021. You may send your transmission at any time that day. If you have a wireless device, the transmission will be sent automatically. After your physician reviews your transmission, you will receive a postcard with your next transmission date.  Any Other Special Instructions Will Be Listed Below (If Applicable).  If you need a refill on your cardiac medications before your next appointment, please call your pharmacy.

## 2021-01-27 NOTE — Progress Notes (Signed)
HPI [name] returns today for followup. He is a pleasant 87 yo man with surgical AS as well asheart block, s/p PPM insertion. He has developed worsening sob and chest pressure. He is pending possible TAVR. He has been found to have a malfunctioning atrial lead with an elevated pacing impedence. The patient has been sedentary for over a year. He has PAF. He has not had syncope.  No Known Allergies   Current Outpatient Medications  Medication Sig Dispense Refill  . acetaminophen (TYLENOL) 500 MG tablet Take 1,000 mg by mouth every 6 (six) hours as needed for moderate pain.    Marland Kitchen amLODipine (NORVASC) 2.5 MG tablet Take 2.5 mg by mouth daily.    Marland Kitchen apixaban (ELIQUIS) 5 MG TABS tablet Take 1 tablet (5 mg total) by mouth 2 (two) times daily. 60 tablet 3  . atorvastatin (LIPITOR) 40 MG tablet Take 40 mg by mouth daily.    . Cyanocobalamin 1000 MCG/ML KIT Inject 1,000 mcg as directed every 30 (thirty) days.    Marland Kitchen donepezil (ARICEPT) 10 MG tablet Take 10 mg by mouth at bedtime.    . enalapril (VASOTEC) 20 MG tablet Take 20 mg by mouth 2 (two) times daily.    . furosemide (LASIX) 40 MG tablet Take 40 mg by mouth. 1 Tablet Daily    . isosorbide mononitrate (IMDUR) 60 MG 24 hr tablet Take 1 tablet (60 mg total) by mouth daily. 90 tablet 3  . Lidocaine 4 % PTCH Apply 1 patch topically daily as needed (pain).    . methylPREDNISolone (MEDROL DOSEPAK) 4 MG TBPK tablet Take 1 tablet by mouth See admin instructions. Use as directed    . metoprolol succinate (TOPROL-XL) 50 MG 24 hr tablet Take 1 tablet (50 mg total) by mouth daily. 90 tablet 3  . nitroGLYCERIN (NITROSTAT) 0.4 MG SL tablet Place 1 tablet (0.4 mg total) under the tongue every 5 (five) minutes as needed for chest pain. 25 tablet 3  . omeprazole (PRILOSEC OTC) 20 MG tablet Take 1 tablet (20 mg total) by mouth daily. 28 tablet 1  . tamsulosin (FLOMAX) 0.4 MG CAPS capsule Take 0.4 mg by mouth daily.     No current facility-administered  medications for this visit.     Past Medical History:  Diagnosis Date  . Aortic stenosis    mild AS 09/2017 echo  . Cancer ([place])    skin  . Coronary artery disease    a.  s/p CABG;   b. cath 4/12: EF 55%, 3vCAD, patent L-LAD, patent S-RCA, patent S-CFX (done after a false pos. ETT)  . Diverticular disease   . GERD (gastroesophageal reflux disease)   . GI bleed   . Hemorrhoids   . HH (hiatus hernia)   . History of kidney stones   . Hypertension   . Osteoarthritis   . Other and unspecified hyperlipidemia   . Presence of permanent cardiac pacemaker   . Schatzki's ring   . Stroke ([place])     ROS:   All systems reviewed and negative except as noted in the HPI.   Past Surgical History:  Procedure Laterality Date  . ARTERIOVENOUS GRAFT PLACEMENT W/ ENDOSCOPIC VEIN HARVEST     of the right leg greater spahenous vein. Surgeon: Tharon Aquas Trigt,M.D.  . COLONOSCOPY  02/24/2010   Hemorrhoids, Diverticulosis. Performed at [facility]. Normal terminal ileum. Dr. June Leap, [place] GRAFT  06/21/2007   CABG x 3 Surgeon Collier Salina  Prescott Gum, MD  . EYE SURGERY     bilateral cataract removal  . hip replace  06/09/2004   left hip Surgeon Pietro Cassis. Alvan Dame, MD  . LEFT HEART CATH AND CORS/GRAFTS ANGIOGRAPHY N/A 11/04/2020   Procedure: LEFT HEART CATH AND CORS/GRAFTS ANGIOGRAPHY;  Surgeon: Troy Sine, MD;  Location: [facility];  Service: Cardiovascular;  Laterality: N/A;  . PACEMAKER IMPLANT N/A 10/03/2017   Procedure: PACEMAKER IMPLANT;  Surgeon: Evans Lance, MD;  Location: [facility];  Service: Cardiovascular;  Laterality: N/A;  . REVERSE SHOULDER ARTHROPLASTY Right 08/25/2018   Procedure: REVERSE SHOULDER ARTHROPLASTY;  Surgeon: Netta Cedars, MD;  Location: [facility];  Service: Orthopedics;  Laterality: Right;     Family History  Problem Relation Age of Onset  . Heart attack Mother   . Hypertension Mother   . Diabetes Father   . Diabetes Brother   .  Diabetes Sister      Social History   Socioeconomic History  . Marital status: Married    Spouse name: Not on file  . Number of children: 2  . Years of education: Not on file  . Highest education level: Not on file  Occupational History  . Occupation: Development worker, community: RETIRED  Tobacco Use  . Smoking status: Never Smoker  . Smokeless tobacco: Never Used  Vaping Use  . Vaping Use: Never used  Substance and Sexual Activity  . Alcohol use: No  . Drug use: No  . Sexual activity: Not on file  Other Topics Concern  . Not on file  Social History Narrative   No Regular exercise. Daily Caffeine: 24 oz pepsi and 1 cup coffee.    Social Determinants of Health   Financial Resource Strain: Not on file  Food Insecurity: Not on file  Transportation Needs: Not on file  Physical Activity: Not on file  Stress: Not on file  Social Connections: Not on file  Intimate Partner Violence: Not on file     BP (!) 110/56   Pulse 70   Ht 5' 9"  (1.753 m)   Wt 162 lb 6.4 oz (73.7 kg)   SpO2 99%   BMI 23.98 kg/m   Physical Exam:  Well appearing NAD HEENT: Unremarkable Neck:  No JVD, no thyromegally Lymphatics:  No adenopathy Back:  No CVA tenderness Lungs:  Clear with no wheezes HEART:  Regular rate rhythm, no murmurs, no rubs, no clicks Abd:  soft, positive bowel sounds, no organomegally, no rebound, no guarding Ext:  2 plus pulses, no edema, no cyanosis, no clubbing Skin:  No rashes no nodules Neuro:  CN II through XII intact, motor grossly intact  EKG - nsr with ventricular pacing  DEVICE  Normal device function.  See PaceArt for details.   Assess/Plan: 1. CHB - he is pacing 97% of the time. He has a paced QRS of under 100.  2. Atrial lead dysfunction -he is sensing appropriately. His impedence is high and I have him set to sense in the atrium. He may someday need an atrial lead revision but I would wait until after a TAVR 3. AS - he has a mean gradient of 22. I  will defer surgical decision to Dr. Jearld Lesch. I wonder if he has a low gradient AS. 4. CAD - he has chest pressure. I will defer to Dr. Jearld Lesch.  Carleene Overlie Nareh Matzke,MD

## 2021-01-27 NOTE — Progress Notes (Signed)
Department of Dental Medicine     OUTPATIENT CONSULTATION  Service Date:   01/27/2021  Patient Name:   Blake Burgess Date of Birth:   [birth year] Medical Record Number: [id number]  Referring Provider:               Darlina Guys, MD  TODAY'S VISIT   Assessment:   There are no current signs of acute odontogenic infection including abscess, edema or erythema, or suspicious lesion requiring biopsy.   There are multiple retained root tips that are chronically infected and a few other teeth with deep decay that may become acutely infected or start to cause pain soon.  Recommendations:   Extractions of all indicated teeth to decrease the risk of perioperative and postoperative systemic infection and complications.   Establish dental care at an outside office of the patient's choice for routine care including cleanings/periodontal therapy and periodic exams. Plan:   Discuss case with medical team and coordinate treatment as needed. Plan to schedule the patient for the operating room for extractions under IV sedation vs general anesthesia pending medical team's recommendations.  Do not need to hold Eliquis prior to dental surgery.  Discussed in detail all treatment options and recommendations with the patient and they are agreeable to the plan.    Thank you for consulting with [facility] and for the opportunity to participate in this patient's treatment.  Should you have any questions or concerns, please contact the [facility] at [phone].   PROGRESS NOTE:   COVID-19 SCREENING:  The patient denies symptoms concerning for COVID-19 infection including fever, chills, cough, or newly developed shortness of breath.   HISTORY OF PRESENT ILLNESS: Blake Burgess is a very pleasant 87 y.o. male with h/o coronary artery disease, HTN, CHF, myocardial infarction, cardiac pacemaker implant placement, stroke, GERD, type 2 diabetes mellitus, anemia, kidney stones,  hyperlipidemia, shoulder replacement, cancer, long-term use of anticoagulation (on Eliquis) and CABG (2008)  who was recently diagnosed with aortic stenosis and is anticipating TAVR.  The patient presents today with his son for a medically necessary dental consultation as part of their pre-cardiac surgery work-up.   DENTAL HISTORY: The patient reports that he does have a dentist, but does not go regularly and it has been quite some time since his last dental visit.  He currently denies any dental/orofacial pain or sensitivity. Patient is able to manage oral secretions.  Patient denies dysphagia, odynophagia, dysphonia, SOB and neck pain.  Patient denies fever, rigors and malaise.   CHIEF COMPLAINT:  Here for a preoperative dental exam.   Patient Active Problem List   Diagnosis Date Noted   Persistent atrial fibrillation ([place]) 11/19/2020   Precordial chest pain 11/19/2020   Pacemaker 11/06/2019   Educated about COVID-19 virus infection 12/12/2018   Nonrheumatic aortic valve stenosis 12/12/2018   Actinic skin damage 10/13/2018   Back pain 10/13/2018   Cancer ([place]) 10/13/2018   Diverticulosis 10/13/2018   Spinal stenosis 10/13/2018   Hearing deficit 10/13/2018   Complete atrioventricular block ([place]) 10/13/2018   Dyspnea 10/13/2018   S/P shoulder replacement, right 08/25/2018   History of artificial joint 08/25/2018   Preop cardiovascular exam 07/13/2018   Coronary artery disease involving native coronary artery of native heart without angina pectoris 06/05/2018   Chronic combined systolic and diastolic heart failure ([place]) 06/05/2018   Dyslipidemia 06/05/2018   Pain in joint of right shoulder 05/09/2018   Osteoarthritis of right glenohumeral joint 05/09/2018   ................................................................................... 04/18/2018  Elevated troponin 04/18/2018   Hypertensive heart and renal disease 04/18/2018   Cardiac pacemaker in situ 15/17/6160   Acute  diastolic (congestive) heart failure ([place]) 73/71/0626   Acute diastolic heart failure ([place]) 10/11/2017   CHB (complete heart block) ([facility])    SOB (shortness of breath)    Dyslipidemia, goal LDL below 70    Risk for falls 12/21/2016   Parent-foster child problem 11/25/2016   Lumbar stenosis with neurogenic claudication 08/04/2016   Lightheadedness 02/03/2016   Right ear pain 03/22/2013   Shingles 03/22/2013   Other specified cardiac arrhythmias 02/19/2013   Old myocardial infarction 02/19/2013   History of total hip replacement 02/19/2013   History of TIA (transient ischemic attack) 02/19/2013   Type II diabetes mellitus ([place]) 02/19/2013   Generalized ischemic cerebrovascular disease 02/19/2013   Heart disease 02/19/2013   Status post aorto-coronary artery bypass graft 02/19/2013   Anemia 02/18/2013   B12 deficiency 02/18/2013   Kidney stones 02/18/2013   GERD (gastroesophageal reflux disease) 02/18/2013   Esophageal stricture 02/18/2013   Stroke ([place]) 02/18/2013   DJD (degenerative joint disease) 02/18/2013   Hyperlipidemia 02/18/2013   Arthritis 01/10/2013   Angina pectoris ([place]) 12/14/2010   Malaise and fatigue 10/19/2010   COUGH 08/07/2010   ANEMIA, SECONDARY TO ACUTE BLOOD LOSS 03/03/2010   GI BLEED 03/03/2010   Osteoarthritis 03/03/2010   Gastrointestinal hemorrhage, unspecified 03/03/2010   Atherosclerotic heart disease of native coronary artery without angina pectoris 02/23/2010   DIZZINESS 12/04/2009   Essential hypertension 02/05/2009   Hx of CABG-2008 02/05/2009   History of coronary artery bypass graft 02/05/2009   H/O acute myocardial infarction 08/23/2006   Past Medical History:  Diagnosis Date   Aortic stenosis    mild AS 09/2017 echo   Cancer [facility])    skin   Coronary artery disease    a.  s/p CABG;   b. cath 4/12: EF 55%, 3vCAD, patent L-LAD, patent S-RCA, patent S-CFX (done after a false pos. ETT)   Diverticular disease    GERD (gastroesophageal reflux  disease)    GI bleed    Hemorrhoids    HH (hiatus hernia)    History of kidney stones    Hypertension    Osteoarthritis    Other and unspecified hyperlipidemia    Presence of permanent cardiac pacemaker    Schatzki's ring    Stroke [facility])    Past Surgical History:  Procedure Laterality Date   ARTERIOVENOUS GRAFT PLACEMENT W/ ENDOSCOPIC VEIN HARVEST     of the right leg greater spahenous vein. Surgeon: Tharon Aquas Trigt,M.D.   COLONOSCOPY  02/24/2010   Hemorrhoids, Diverticulosis. Performed at [facility]. Normal terminal ileum. Dr. June Leap, [place] ARTERY BYPASS GRAFT  06/21/2007   CABG x 3 Surgeon Ivin Poot, MD   EYE SURGERY     bilateral cataract removal   hip replace  06/09/2004   left hip Surgeon Pietro Cassis. Alvan Dame, MD   LEFT HEART CATH AND CORS/GRAFTS ANGIOGRAPHY N/A 11/04/2020   Procedure: LEFT HEART CATH AND CORS/GRAFTS ANGIOGRAPHY;  Surgeon: Troy Sine, MD;  Location: [facility];  Service: Cardiovascular;  Laterality: N/A;   PACEMAKER IMPLANT N/A 10/03/2017   Procedure: PACEMAKER IMPLANT;  Surgeon: Evans Lance, MD;  Location: [facility];  Service: Cardiovascular;  Laterality: N/A;   REVERSE SHOULDER ARTHROPLASTY Right 08/25/2018   Procedure: REVERSE SHOULDER ARTHROPLASTY;  Surgeon: Netta Cedars, MD;  Location: [facility];  Service: Orthopedics;  Laterality: Right;  No Known Allergies Current Outpatient Medications  Medication Sig Dispense Refill   acetaminophen (TYLENOL) 500 MG tablet Take 1,000 mg by mouth every 6 (six) hours as needed for moderate pain.     amLODipine (NORVASC) 2.5 MG tablet Take 2.5 mg by mouth daily.     apixaban (ELIQUIS) 5 MG TABS tablet Take 1 tablet (5 mg total) by mouth 2 (two) times daily. 60 tablet 3   atorvastatin (LIPITOR) 40 MG tablet Take 40 mg by mouth daily.     Cyanocobalamin 1000 MCG/ML KIT Inject 1,000 mcg as directed every 30 (thirty) days.     donepezil (ARICEPT) 10 MG tablet Take 10 mg by mouth at bedtime.      enalapril (VASOTEC) 20 MG tablet Take 20 mg by mouth 2 (two) times daily.     furosemide (LASIX) 40 MG tablet Take 40 mg by mouth. 1 Tablet Daily     isosorbide mononitrate (IMDUR) 60 MG 24 hr tablet Take 1 tablet (60 mg total) by mouth daily. 90 tablet 3   Lidocaine 4 % PTCH Apply 1 patch topically daily as needed (pain).     methylPREDNISolone (MEDROL DOSEPAK) 4 MG TBPK tablet Take 1 tablet by mouth See admin instructions. Use as directed     metoprolol succinate (TOPROL-XL) 50 MG 24 hr tablet Take 1 tablet (50 mg total) by mouth daily. 90 tablet 3   nitroGLYCERIN (NITROSTAT) 0.4 MG SL tablet Place 1 tablet (0.4 mg total) under the tongue every 5 (five) minutes as needed for chest pain. 25 tablet 3   omeprazole (PRILOSEC OTC) 20 MG tablet Take 1 tablet (20 mg total) by mouth daily. 28 tablet 1   tamsulosin (FLOMAX) 0.4 MG CAPS capsule Take 0.4 mg by mouth daily.     No current facility-administered medications for this visit.    LABS: Lab Results  Component Value Date   WBC 5.4 11/12/2020   HGB 10.9 (L) 11/12/2020   HCT 34.5 (L) 11/12/2020   MCV 97.5 11/12/2020   PLT 291 11/12/2020      Component Value Date/Time   NA 137 01/12/2021 1216   K 4.4 01/12/2021 1216   CL 100 01/12/2021 1216   CO2 24 01/12/2021 1216   GLUCOSE 116 (H) 01/12/2021 1216   GLUCOSE 139 (H) 11/12/2020 1037   BUN 24 01/12/2021 1216   CREATININE 1.37 (H) 01/12/2021 1216   CALCIUM 8.9 01/12/2021 1216   GFRNONAA 57 (L) 11/12/2020 1037   GFRAA >60 08/26/2018 0726   Lab Results  Component Value Date   INR 1.18 10/01/2017   INR 1.02 06/03/2013   INR 1.05 02/04/2012   No results found for: PTT  Social History   Socioeconomic History   Marital status: Married    Spouse name: Not on file   Number of children: 2   Years of education: Not on file   Highest education level: Not on file  Occupational History   Occupation: Development worker, community: RETIRED  Tobacco Use   Smoking status: Never  Smoker   Smokeless tobacco: Never Used  Scientific laboratory technician Use: Never used  Substance and Sexual Activity   Alcohol use: No   Drug use: No   Sexual activity: Not on file  Other Topics Concern   Not on file  Social History Narrative   No Regular exercise. Daily Caffeine: 24 oz pepsi and 1 cup coffee.    Social Determinants of Health   Financial Resource Strain: Not on file  Food Insecurity: Not on file  Transportation Needs: Not on file  Physical Activity: Not on file  Stress: Not on file  Social Connections: Not on file  Intimate Partner Violence: Not on file   Family History  Problem Relation Age of Onset   Heart attack Mother    Hypertension Mother    Diabetes Father    Diabetes Brother    Diabetes Sister     REVIEW OF SYSTEMS:  Reviewed with the patient as per HPI. Psych:  Patient denies having dental phobia.   VITAL SIGNS: BP (!) 147/106 (BP Location: Right Arm)   Pulse (!) 56   Temp 97.9 F (36.6 C) (Oral)    PHYSICAL EXAM: General:  Well-developed, comfortable and in no apparent distress. Neurological:  Alert and oriented to person, place and  time. Extraoral:  Facial symmetry present without any edema or erythema.  No swelling or lymphadenopathy.  TMJ asymptomatic without clicks or crepitations. Intraoral:  Soft tissues appear well-perfused and mucous membranes moist.  FOM and vestibules soft and not raised. Oral cavity without mass or lesion. No signs of infection, parulis, sinus tract, edema or erythema evident upon exam.   DENTAL EXAM:  Hard tissue exam completed and charted. Overall impression:  Fair remaining dentition.  Missing teeth, caries, retained root tips, existing restorations.   Oral hygiene:  Poor    Periodontal:  Pink, healthy gingival tissue with blunted papilla.  Localized plaque accumulation.  Gingival recession. Caries:  #3, #5, #7, #11, #12, #13, #14, #22, #27 and #28 Retained root tips:  #6, #10, #15 and #25 Defective  restorations:  #3, #5, #12, #13, #14 all have recurrent decay under existing restorations. Endodontics:   #6 and #15 have been previously root canal treated and now are retained root tips Removable/fixed prosthodontics:  Patient denies wearing partial dentures. #30 has existing SSC. Occlusion:  Unable to assess molar occlusion.  Non-functional teeth numbers 12 and 13. Other findings:  Attrition/wear: #22I, #23I, #24I, #26I and #27I.  Abfraction(s): #5B(V)   RADIOGRAPHIC EXAM:  PAN and Full Mouth Series exposed and interpreted.  Condyles seated bilaterally in fossas.  No evidence of abnormal pathology.  All visualized osseous structures appear WNL. Retained root tips #6, #10 and #14. #3 and #30 are mesially drifting.  Generalized mild horizontal bone loss consistent with mild periodontitis. Missing teeth, existing restorations, #30 full-coverage restoration/crown.  Caries- #58M&D, #7D, #11DIFL deep decay approximating pulp, #12D, #158M&D, #14D, #22D, #25 deep decay approximating the pulp, #24M&D.  #10, #15 and #25 have periapical radiolucencies. #6 and #15 have been previously endodontically treated and are retained roots.   ASSESSMENT:  1.  Aortic stenosis 2.  Preoperative dental exam 3.  Long-term (current) use of anticoagulation 4.  Missing teeth 5.  Caries 6.  Retained root tips 7.  Chronic apical periodontitis 8.  Gingival recession, generalized 9.  Defective dental restoration 10. Attrition/wear 11. Abfraction/flexure 12.  Postoperative bleeding risk   PLAN AND RECOMMENDATIONS: I discussed the risks, benefits, and complications of various scenarios with the patient in relationship to their medical and dental conditions, which included systemic infection such as endocarditis, bacteremia or other serious issues that could potentially occur either before, during or after their anticipated cardiac surgery if dental/oral concerns are not addressed.  I explained that if any chronic or  acute dental/oral infection(s) are addressed and subsequently not maintained following medical optimization and recovery, their risk of the previously mentioned complications are just as high and could potentially occur postoperatively.  I explained all significant findings of the dental consultation with the patient including several retained root tips that are chronically infected and other teeth with large cavities and smaller cavities and the recommended care including extractions of indicated teeth (#6, #10, #11, #15 and #25) in order to optimize them for heart surgery from a dental standpoint.  The patient verbalized understanding of all findings, discussion, and recommendations. We then discussed various treatment options to include no treatment, multiple extractions with alveoloplasty, pre-prosthetic surgery as indicated, periodontal therapy, dental restorations, root canal therapy, crown and bridge therapy, implant therapy, and replacement of missing teeth as indicated.  The patient verbalized understanding of all options, and currently wishes to proceed with extractions of recommended teeth in the operating room. Plan to discuss all findings and recommendations with medical team and coordinate future care as needed.  The patient will need to establish care at a dental office of his choice for routine dental care including replacement of missing teeth as needed, cleanings and exams.  All questions and concerns were invited and addressed.  The patient tolerated today's visit well and departed in stable condition.   I spent in excess of 120 minutes during the conduct of this consultation and >50% of this time involved direct face-to-face encounter for counseling and/or coordination of the patient's care. Athol Benson Norway, D.M.D.

## 2021-01-27 NOTE — Patient Instructions (Signed)
[facility] Department of Dental Medicine Kataleyah Carducci B. Adelayde Minney, D.M.D. Phone: [phone] Fax: [phone]   It was a pleasure seeing you today!  Please refer to the information below regarding your dental visit with us.  Call us if any questions or concerns come up after you leave.   Thank you for letting us provide care for you.  If there is anything we can do for you, please let us know.    HEART VALVES AND MOUTH CARE   FACTS: If you have any infection in your mouth, it can infect your heart valve. If you heart valve is infected, you will be seriously ill. Infections in the mouth can be SILENT and do not always cause pain. Examples of infections in the mouth are gum disease, dental cavities, and abscesses. Some possible signs of infection are: Bad breath, bleeding gums, or teeth that are sensitive to sweets, hot, and/or cold. There are many other signs as well.   WHAT YOU HAVE TO DO: Brush your teeth after meals and at bedtime.  Spend at least 2 minutes brushing well, especially behind your back teeth and all around your teeth that stand alone.  Brush at the gumline also. Do not go to bed without brushing your teeth and flossing. If your gums bleed when you brush or floss, do NOT stop brushing or flossing.  Bleeding can be a sign of inflammation or irritation from bacteria.  It usually means that your gums need more attention and better cleaning.  If your dentist or Dr. Edie Vallandingham gave you a prescription mouthwash to use, make sure to use it as directed. If you run out of the medication, get a refill at the pharmacy. If you were given any other medications or directions by your dentist, please follow them.  If you did not understand the directions or forget what you were told, please call.  We will be happy to refresh your memory. If you need antibiotics before dental procedures, make sure you take them one hour prior to every dental visit as directed.  Get a dental check-up every 4-6  months in order to keep your mouth healthy, or to find and treat any new infection. You will most likely need your teeth cleaned or gums treated at the same time. If you are not able to come in for your scheduled appointment, call your dentist as soon as possible to reschedule. If you have a problem in between dental visits, call your dentist.   QUESTIONS? Call our office during office hours [phone].    WE ARE A TEAM.  OUR GOAL IS:  HEALTHY MOUTH, HEALTHY HEART   

## 2021-01-29 ENCOUNTER — Other Ambulatory Visit: Payer: Self-pay

## 2021-01-29 DIAGNOSIS — I350 Nonrheumatic aortic (valve) stenosis: Secondary | ICD-10-CM

## 2021-02-02 ENCOUNTER — Encounter (HOSPITAL_COMMUNITY): Payer: Self-pay | Admitting: Dentistry

## 2021-02-02 NOTE — Progress Notes (Signed)
DUE TO COVID-19 ONLY ONE VISITOR IS ALLOWED TO COME WITH YOU AND STAY IN THE WAITING ROOM ONLY DURING PRE OP AND PROCEDURE DAY OF SURGERY.   PCP - Dr Raelene Bott Cardiologist - Dr Percival Spanish Pacemaker - Dr Cristopher Peru  Chest x-ray - 11/12/20 (2V) EKG - 01/27/21 Stress Test - 07/29/20 ECHO - 12/10/20 Cardiac Cath - 11/04/20  ICD Pacemaker - [employer] Pacemaker managed by Dr Cristopher Peru.  Last remote device check was on 01/22/21.  Emailed [employer] Reps Renae Fickle and Tobias Alexander with surgery date and time. IB message sent to [facility] - Dr Lovena Le  Instructions:  Follow your surgeon's instructions on eliquis.  Per MD, no need to hold eliquis prior to dental surgery.  See MD's Note on ELIQUIS.   Anesthesia review: Yes, reviewed with Dr Linna Caprice via phone.  STOP now taking any Aspirin (unless otherwise instructed by your surgeon), Aleve, Naproxen, Ibuprofen, Motrin, Advil, Goody's, BC's, all herbal medications, fish oil, and all vitamins.   Coronavirus Screening Covid test n/a - Ambulatory Surgery  Do you have any of the following symptoms:  Cough yes/no: No Fever (>100.6F)  yes/no: No Runny nose yes/no: No Sore throat yes/no: No Difficulty breathing/shortness of breath  Yes - nothing new  Have you traveled in the last 14 days and where? yes/no: No  Son [name] verbalized understanding of instructions that were given via phone.

## 2021-02-03 ENCOUNTER — Encounter (HOSPITAL_COMMUNITY)
Admission: RE | Disposition: A | Discharge status: Home / Self Care | Payer: Self-pay | Source: Home / Self Care | Attending: Dentistry

## 2021-02-03 ENCOUNTER — Ambulatory Visit (HOSPITAL_COMMUNITY)
Admission: RE | Admit: 2021-02-03 | Discharge: 2021-02-03 | Disposition: A | Discharge status: Home / Self Care | Payer: Medicare HMO | Attending: Dentistry | Admitting: Dentistry

## 2021-02-03 ENCOUNTER — Encounter: Payer: Self-pay | Admitting: Internal Medicine

## 2021-02-03 ENCOUNTER — Encounter (HOSPITAL_COMMUNITY): Payer: Self-pay | Admitting: Dentistry

## 2021-02-03 ENCOUNTER — Ambulatory Visit (HOSPITAL_COMMUNITY): Payer: Medicare HMO | Admitting: Anesthesiology

## 2021-02-03 DIAGNOSIS — Z7982 Long term (current) use of aspirin: Secondary | ICD-10-CM | POA: Insufficient documentation

## 2021-02-03 DIAGNOSIS — K219 Gastro-esophageal reflux disease without esophagitis: Secondary | ICD-10-CM | POA: Diagnosis not present

## 2021-02-03 DIAGNOSIS — Z950 Presence of cardiac pacemaker: Secondary | ICD-10-CM | POA: Diagnosis not present

## 2021-02-03 DIAGNOSIS — Z96611 Presence of right artificial shoulder joint: Secondary | ICD-10-CM | POA: Insufficient documentation

## 2021-02-03 DIAGNOSIS — K0530 Chronic periodontitis, unspecified: Secondary | ICD-10-CM | POA: Diagnosis not present

## 2021-02-03 DIAGNOSIS — Z7901 Long term (current) use of anticoagulants: Secondary | ICD-10-CM | POA: Insufficient documentation

## 2021-02-03 DIAGNOSIS — I2510 Atherosclerotic heart disease of native coronary artery without angina pectoris: Secondary | ICD-10-CM | POA: Diagnosis not present

## 2021-02-03 DIAGNOSIS — I4819 Other persistent atrial fibrillation: Secondary | ICD-10-CM | POA: Diagnosis not present

## 2021-02-03 DIAGNOSIS — Z8673 Personal history of transient ischemic attack (TIA), and cerebral infarction without residual deficits: Secondary | ICD-10-CM | POA: Diagnosis not present

## 2021-02-03 DIAGNOSIS — I5042 Chronic combined systolic (congestive) and diastolic (congestive) heart failure: Secondary | ICD-10-CM | POA: Diagnosis not present

## 2021-02-03 DIAGNOSIS — Z79899 Other long term (current) drug therapy: Secondary | ICD-10-CM | POA: Diagnosis not present

## 2021-02-03 DIAGNOSIS — K029 Dental caries, unspecified: Secondary | ICD-10-CM | POA: Diagnosis not present

## 2021-02-03 DIAGNOSIS — K083 Retained dental root: Secondary | ICD-10-CM

## 2021-02-03 DIAGNOSIS — K045 Chronic apical periodontitis: Secondary | ICD-10-CM

## 2021-02-03 DIAGNOSIS — I350 Nonrheumatic aortic (valve) stenosis: Secondary | ICD-10-CM | POA: Insufficient documentation

## 2021-02-03 DIAGNOSIS — I255 Ischemic cardiomyopathy: Secondary | ICD-10-CM | POA: Insufficient documentation

## 2021-02-03 DIAGNOSIS — Z951 Presence of aortocoronary bypass graft: Secondary | ICD-10-CM | POA: Diagnosis not present

## 2021-02-03 DIAGNOSIS — Z85828 Personal history of other malignant neoplasm of skin: Secondary | ICD-10-CM | POA: Insufficient documentation

## 2021-02-03 DIAGNOSIS — I442 Atrioventricular block, complete: Secondary | ICD-10-CM | POA: Diagnosis not present

## 2021-02-03 DIAGNOSIS — I110 Hypertensive heart disease with heart failure: Secondary | ICD-10-CM | POA: Insufficient documentation

## 2021-02-03 HISTORY — PX: MULTIPLE EXTRACTIONS WITH ALVEOLOPLASTY: SHX5342

## 2021-02-03 LAB — BASIC METABOLIC PANEL
Anion gap: 6 (ref 5–15)
BUN: 31 mg/dL — ABNORMAL HIGH (ref 8–23)
CO2: 23 mmol/L (ref 22–32)
Calcium: 8.4 mg/dL — ABNORMAL LOW (ref 8.9–10.3)
Chloride: 109 mmol/L (ref 98–111)
Creatinine, Ser: 1.23 mg/dL (ref 0.61–1.24)
GFR, Estimated: 57 mL/min — ABNORMAL LOW (ref >60)
Glucose, Bld: 118 mg/dL — ABNORMAL HIGH (ref 70–99)
Potassium: 3.7 mmol/L (ref 3.5–5.1)
Sodium: 138 mmol/L (ref 135–145)

## 2021-02-03 SURGERY — MULTIPLE EXTRACTION WITH ALVEOLOPLASTY
Anesthesia: General | Site: Mouth

## 2021-02-03 MED ORDER — SUGAMMADEX SODIUM 200 MG/2ML IV SOLN
INTRAVENOUS | Status: DC | PRN
  Administered 2021-02-03 (×2): 200 mg via INTRAVENOUS

## 2021-02-03 MED ORDER — HEMOSTATIC AGENTS (NO CHARGE) OPTIME
TOPICAL | Status: DC | PRN
  Administered 2021-02-03: 1 via TOPICAL

## 2021-02-03 MED ORDER — PROPOFOL 10 MG/ML IV BOLUS
INTRAVENOUS | Status: DC | PRN
  Administered 2021-02-03: 110 mg via INTRAVENOUS

## 2021-02-03 MED ORDER — DIPHENHYDRAMINE HCL 50 MG/ML IJ SOLN
INTRAMUSCULAR | Status: DC | PRN
  Administered 2021-02-03: 6.25 mg via INTRAVENOUS

## 2021-02-03 MED ORDER — LACTATED RINGERS IV SOLN: INTRAVENOUS | Status: DC

## 2021-02-03 MED ORDER — LIDOCAINE-EPINEPHRINE 2 %-1:100000 IJ SOLN
INTRAMUSCULAR | Status: DC | PRN
  Administered 2021-02-03: 5.1 mL via INTRADERMAL

## 2021-02-03 MED ORDER — OXYMETAZOLINE HCL 0.05 % NA SOLN
NASAL | Status: AC
  Filled 2021-02-03: qty 30

## 2021-02-03 MED ORDER — FENTANYL CITRATE (PF) 250 MCG/5ML IJ SOLN
INTRAMUSCULAR | Status: DC | PRN
  Administered 2021-02-03 (×3): 50 ug via INTRAVENOUS

## 2021-02-03 MED ORDER — AMOXICILLIN 500 MG PO CAPS: 500.0000 mg | ORAL_CAPSULE | Freq: Three times a day (TID) | ORAL | 0 refills | Status: AC

## 2021-02-03 MED ORDER — LIDOCAINE-EPINEPHRINE 2 %-1:100000 IJ SOLN
INTRAMUSCULAR | Status: AC
  Filled 2021-02-03: qty 10.2

## 2021-02-03 MED ORDER — LIDOCAINE HCL (PF) 2 % IJ SOLN
INTRAMUSCULAR | Status: DC | PRN
  Administered 2021-02-03: 100 mg via INTRADERMAL

## 2021-02-03 MED ORDER — DEXAMETHASONE SODIUM PHOSPHATE 10 MG/ML IJ SOLN
INTRAMUSCULAR | Status: DC | PRN
  Administered 2021-02-03: 5 mg via INTRAVENOUS

## 2021-02-03 MED ORDER — ORAL CARE MOUTH RINSE: 15.0000 mL | Freq: Once | OROMUCOSAL | Status: AC

## 2021-02-03 MED ORDER — CHLORHEXIDINE GLUCONATE 0.12 % MT SOLN
OROMUCOSAL | Status: AC
  Administered 2021-02-03: 15 mL via OROMUCOSAL
  Filled 2021-02-03: qty 15

## 2021-02-03 MED ORDER — CHLORHEXIDINE GLUCONATE 0.12 % MT SOLN: 15.0000 mL | Freq: Once | OROMUCOSAL | Status: AC

## 2021-02-03 MED ORDER — ROCURONIUM BROMIDE 10 MG/ML (PF) SYRINGE
PREFILLED_SYRINGE | INTRAVENOUS | Status: DC | PRN
  Administered 2021-02-03: 70 mg via INTRAVENOUS
  Administered 2021-02-03: 10 mg via INTRAVENOUS

## 2021-02-03 MED ORDER — 0.9 % SODIUM CHLORIDE (POUR BTL) OPTIME
TOPICAL | Status: DC | PRN
  Administered 2021-02-03: 800 mL

## 2021-02-03 MED ORDER — BUPIVACAINE-EPINEPHRINE (PF) 0.5% -1:200000 IJ SOLN
INTRAMUSCULAR | Status: AC
  Filled 2021-02-03: qty 3.6

## 2021-02-03 MED ORDER — ONDANSETRON HCL 4 MG/2ML IJ SOLN
INTRAMUSCULAR | Status: DC | PRN
  Administered 2021-02-03: 4 mg via INTRAVENOUS

## 2021-02-03 MED ORDER — CEFAZOLIN SODIUM-DEXTROSE 2-4 GM/100ML-% IV SOLN
2.0000 g | INTRAVENOUS | Status: AC
  Administered 2021-02-03: 2 g via INTRAVENOUS
  Filled 2021-02-03: qty 100

## 2021-02-03 MED ORDER — FENTANYL CITRATE (PF) 250 MCG/5ML IJ SOLN
INTRAMUSCULAR | Status: AC
  Filled 2021-02-03: qty 5

## 2021-02-03 SURGICAL SUPPLY — 34 items
ALCOHOL 70% 16 OZ (MISCELLANEOUS) ×2 IMPLANT
BLADE SURG 15 STRL LF DISP TIS (BLADE) ×1 IMPLANT
BLADE SURG 15 STRL SS (BLADE) ×2
COVER SURGICAL LIGHT HANDLE (MISCELLANEOUS) ×2 IMPLANT
GAUZE 4X4 16PLY RFD (DISPOSABLE) ×2 IMPLANT
GAUZE PACKING FOLDED 2  STR (GAUZE/BANDAGES/DRESSINGS) ×2
GAUZE PACKING FOLDED 2 STR (GAUZE/BANDAGES/DRESSINGS) ×1 IMPLANT
GLOVE SURG ENC MOIS LTX SZ6.5 (GLOVE) ×2 IMPLANT
GLOVE SURG POLYISO LF SZ6 (GLOVE) ×2 IMPLANT
GOWN STRL REUS W/ TWL LRG LVL3 (GOWN DISPOSABLE) ×2 IMPLANT
GOWN STRL REUS W/TWL LRG LVL3 (GOWN DISPOSABLE) ×4
KIT BASIN OR (CUSTOM PROCEDURE TRAY) ×2 IMPLANT
KIT TURNOVER KIT B (KITS) ×2 IMPLANT
MANIFOLD NEPTUNE II (INSTRUMENTS) ×2 IMPLANT
NEEDLE BLUNT 16X1.5 OR ONLY (NEEDLE) ×2 IMPLANT
NEEDLE DENTAL 27 LONG (NEEDLE) ×4 IMPLANT
NS IRRIG 1000ML POUR BTL (IV SOLUTION) ×2 IMPLANT
PACK EENT II TURBAN DRAPE (CUSTOM PROCEDURE TRAY) ×2 IMPLANT
PAD ARMBOARD 7.5X6 YLW CONV (MISCELLANEOUS) ×2 IMPLANT
SPONGE SURGIFOAM ABS GEL 100 (HEMOSTASIS) IMPLANT
SPONGE SURGIFOAM ABS GEL 12-7 (HEMOSTASIS) IMPLANT
SPONGE SURGIFOAM ABS GEL SZ50 (HEMOSTASIS) ×2 IMPLANT
SUCTION FRAZIER HANDLE 10FR (MISCELLANEOUS) ×2
SUCTION TUBE FRAZIER 10FR DISP (MISCELLANEOUS) ×1 IMPLANT
SUT CHROMIC 3 0 PS 2 (SUTURE) ×4 IMPLANT
SUT CHROMIC 4 0 P 3 18 (SUTURE) IMPLANT
SYR 50ML SLIP (SYRINGE) ×2 IMPLANT
SYR BULB IRRIG 60ML STRL (SYRINGE) ×2 IMPLANT
TOWEL GREEN STERILE (TOWEL DISPOSABLE) ×2 IMPLANT
TOWEL GREEN STERILE FF (TOWEL DISPOSABLE) ×2 IMPLANT
TUBE CONNECTING 12X1/4 (SUCTIONS) ×2 IMPLANT
WATER STERILE IRR 1000ML POUR (IV SOLUTION) ×2 IMPLANT
WATER TABLETS ICX (MISCELLANEOUS) ×2 IMPLANT
YANKAUER SUCT BULB TIP NO VENT (SUCTIONS) ×2 IMPLANT

## 2021-02-03 NOTE — Anesthesia Postprocedure Evaluation (Signed)
Anesthesia Post Note  Patient: Blake Burgess  Procedure(s) Performed: MULTIPLE EXTRACTION WITH ALVEOLOPLASTY (Mouth)     Patient location during evaluation: PACU Anesthesia Type: General Level of consciousness: sedated Pain management: pain level controlled Vital Signs Assessment: post-procedure vital signs reviewed and stable Respiratory status: spontaneous breathing and respiratory function stable Cardiovascular status: stable Postop Assessment: no apparent nausea or vomiting Anesthetic complications: no   No notable events documented.  Last Vitals:  Vitals:   02/03/21 1407 02/03/21 1422  BP: (!) 145/59 (!) 145/57  Pulse: 66 63  Resp: 17 16  Temp:  36.7 C  SpO2: 94% 94%    Last Pain:  Vitals:   02/03/21 1422  TempSrc:   PainSc: 0-No pain                 Takoda Siedlecki DANIEL

## 2021-02-03 NOTE — Discharge Instructions (Addendum)
[place] Department of Dental Medicine Madison B. Owsley, D.M.D. Phone: [phone] Fax: [phone]    MOUTH CARE AFTER SURGERY   FACTS: Ice used in ice bag helps keep the swelling down, and can help lessen the pain. It is easier to treat pain BEFORE it happens. Spitting disturbs the clot and may cause bleeding to start again, or to get worse. Smoking delays healing and can cause complications. Sharing prescriptions can be dangerous.  Do not take medications not recently prescribed for you. Antibiotics may stop birth control pills from working.  Use other means of birth control while on antibiotics. Warm salt water rinses after the first 24 hours will help lessen the swelling:  Use 1/2 teaspoonful of table salt per oz.of water.  DO NOT: Do not spit.   Do not drink through a straw. Strongly advised not to smoke, dip snuff or chew tobacco at least for 3 days. Do not eat sharp or crunchy foods.  Avoid the area of surgery when chewing. Do not stop your antibiotics before your instructions say to do so. Do not eat hot foods until bleeding has stopped.  If you need to, let your food cool down to room temperature.  EXPECT: Some swelling, especially first 2-3 days. Soreness or discomfort in varying degrees.  Follow your dentist's instructions about how to handle pain before it starts. Pinkish saliva or light blood in saliva, or on your pillow in the morning.  This can last around 24 hours. Bruising inside or outside the mouth.  This may not show up until 2-3 days after surgery.  Don't worry, it will go away in time. Pieces of "bone" may work themselves loose.  It's OK.  If they bother you, let us know.    WHAT TO DO IMMEDIATELY AFTER SURGERY: Bite on gauze with steady pressure for 30-45 minutes at a time.  Switch out the gauze after 30-45 minutes for clean gauze, and continue this for 1-2 hours or until bleeding subsides. Do not chew on the gauze. Do not lie down flat.  Raise  your head support especially for the first 24 hours. Apply ice to your face on the side of the surgery.  You may apply it 20 minutes on and a few minutes off.  Ice for 8-12 hours.  You may use ice up to 24 hours. Before the numbness wears off, take a pain pill as instructed. Prescription pain medication is not always required.  SWELLING: Expect swelling for the first couple of days.  It should get better after that. If swelling increases 3 days or so after surgery, let us know as soon as possible.  FEVER: Take Tylenol every 4 hours if needed to lower your temperature, especially if it is at 100F or higher. Drink lots of fluids. If the fever does not go away, let us know.  BREATHING TROUBLE: Any unusual difficulty breathing means you have to have someone bring you to the emergency room ASAP.  BLEEDING: Light oozing is expected for 24 hours or so. Prop head up with pillows. Do not spit. Do not confuse bright red fresh flowing blood with lots of saliva colored with a little bit of blood. If you notice some bleeding, place gauze or a tea bag where it is bleeding and apply CONSTANT pressure by biting down for 1 hour.  Avoid talking during this time.  Do not remove the gauze or tea bag during this hour to "check" the bleeding. If you notice bright RED bleeding FLOWING out   of particular area, and filling the floor of your mouth, put a wad of gauze on that area, bite down firmly and constantly.  Call us immediately.  If we're closed, have someone bring you to the emergency room.  ORAL HYGIENE: Brush your teeth as usual after meals and before bedtime. Use a soft toothbrush around the area of surgery. DO NOT AVOID BRUSHING.  Otherwise bacteria(germs) will grow and may delay healing or encourage infection. Since you cannot spit, just gently rinse and let the water flow out of your mouth. DO NOT SWISH HARD.  EATING: Cool liquids are a good point to start.  Increase to soft foods as  tolerated.   PRESCRIPTIONS: Follow the directions for your prescriptions exactly as written. If your doctor gave you a narcotic pain medication, do not drive, operate machinery or drink alcohol when on that medication.   QUESTIONS? Call our office during office hours [phone] or call the Emergency Room at [phone].  

## 2021-02-03 NOTE — Transfer of Care (Signed)
Immediate Anesthesia Transfer of Care Note  Patient: Lilia Argue  Procedure(s) Performed: MULTIPLE EXTRACTION WITH ALVEOLOPLASTY (Mouth)  Patient Location: PACU  Anesthesia Type:MAC  Level of Consciousness: drowsy, patient cooperative and responds to stimulation  Airway & Oxygen Therapy: Patient Spontanous Breathing  Post-op Assessment: Report given to RN and Post -op Vital signs reviewed and stable  Post vital signs: Reviewed and stable  Last Vitals:  Vitals Value Taken Time  BP 149/58 02/03/21 1353  Temp 36.7 C 02/03/21 1353  Pulse 72 02/03/21 1354  Resp 14 02/03/21 1354  SpO2 95 % 02/03/21 1354  Vitals shown include unvalidated device data.  Last Pain:  Vitals:   02/03/21 1028  TempSrc:   PainSc: 0-No pain         Complications: No notable events documented.

## 2021-02-03 NOTE — Anesthesia Procedure Notes (Signed)
Procedure Name: Intubation Date/Time: 02/03/2021 12:25 PM Performed by: Janace Litten, CRNA Pre-anesthesia Checklist: Patient identified, Emergency Drugs available, Suction available and Patient being monitored Patient Re-evaluated:Patient Re-evaluated prior to induction Oxygen Delivery Method: Circle System Utilized Preoxygenation: Pre-oxygenation with 100% oxygen Induction Type: IV induction Ventilation: Mask ventilation without difficulty Laryngoscope Size: Mac and 4 Grade View: Grade I Tube type: Oral Tube size: 7.0 mm Number of attempts: 1 Airway Equipment and Method: Stylet and Oral airway Placement Confirmation: ETT inserted through vocal cords under direct vision, positive ETCO2 and breath sounds checked- equal and bilateral Secured at: 22 cm Tube secured with: Tape Dental Injury: Teeth and Oropharynx as per pre-operative assessment  Comments: Elective oral tube due to patient's Eliquis administration

## 2021-02-03 NOTE — Interval H&P Note (Signed)
History and Physical Interval Note:  02/03/2021 12:01 PM  Blake Burgess  has presented today for surgery, with the diagnosis of dental caries.  The various methods of treatment have been discussed with the patient and family. After consideration of risks, benefits and other options for treatment, the patient has consented to  Procedure(s): MULTIPLE EXTRACTION WITH ALVEOLOPLASTY (N/A) as a surgical intervention.  The patient's history has been reviewed, patient examined, no change in status, stable for surgery.  I have reviewed the patient's chart and labs.  Questions were answered to the patient's satisfaction.     Charlaine Dalton

## 2021-02-03 NOTE — Anesthesia Preprocedure Evaluation (Addendum)
Anesthesia Evaluation  Patient identified by MRN, date of birth, ID band Patient awake    Reviewed: Allergy & Precautions, NPO status , Patient's Chart, lab work & pertinent test results  History of Anesthesia Complications Negative for: history of anesthetic complications  Airway Mallampati: II  TM Distance: >3 FB Neck ROM: Full    Dental  (+) Poor Dentition, Dental Advisory Given   Pulmonary neg pulmonary ROS,    Pulmonary exam normal        Cardiovascular hypertension, + angina + CAD, + Past MI and + CABG  + pacemaker  Rhythm:Regular Rate:Normal + Systolic murmurs IMPRESSIONS    1. Left ventricular ejection fraction, by estimation, is 45 to 50%. The  left ventricle has mildly decreased function. The left ventricle  demonstrates regional wall motion abnormalities (see scoring  diagram/findings for description). Left ventricular  diastolic parameters are consistent with Grade II diastolic dysfunction  (pseudonormalization). Elevated left atrial pressure. There is moderate  hypokinesis of the left ventricular, basal inferior wall and inferolateral  wall.  2. Right ventricular systolic function is normal. The right ventricular  size is normal. There is normal pulmonary artery systolic pressure. The  estimated right ventricular systolic pressure is 33.8 mmHg.  3. Left atrial size was moderately dilated.  4. Right atrial size was moderately dilated.  5. The mitral valve is normal in structure. Mild mitral valve  regurgitation.  6. The aortic valve is tricuspid. There is severe calcifcation of the  aortic valve. There is severe thickening of the aortic valve. Aortic valve  regurgitation is not visualized. Moderate to severe aortic valve stenosis.  7. The inferior vena cava is normal in size with greater than 50%  respiratory variability, suggesting right atrial pressure of 3 mmHg.   Comparison(s): A prior study was  performed on 10/01/2017. Prior images  reviewed side by side. The left ventricular function is worsened. The left  ventricular diastolic function is significantly worse. The left  ventricular wall motion abnormality is  worse. Aortic stenosis has worsened.    Neuro/Psych CVA    GI/Hepatic Neg liver ROS, hiatal hernia, GERD  ,  Endo/Other  diabetes  Renal/GU Renal disease     Musculoskeletal negative musculoskeletal ROS (+)   Abdominal   Peds  Hematology negative hematology ROS (+) anemia ,   Anesthesia Other Findings   Reproductive/Obstetrics                            Anesthesia Physical Anesthesia Plan  ASA: 4  Anesthesia Plan: General   Post-op Pain Management:    Induction: Intravenous  PONV Risk Score and Plan: 3 and Ondansetron, Dexamethasone and Diphenhydramine  Airway Management Planned: Oral ETT  Additional Equipment:   Intra-op Plan:   Post-operative Plan: Extubation in OR  Informed Consent: I have reviewed the patients History and Physical, chart, labs and discussed the procedure including the risks, benefits and alternatives for the proposed anesthesia with the patient or authorized representative who has indicated his/her understanding and acceptance.     Dental advisory given  Plan Discussed with: Anesthesiologist and CRNA  Anesthesia Plan Comments:        Anesthesia Quick Evaluation

## 2021-02-03 NOTE — Progress Notes (Signed)
PERIOPERATIVE PRESCRIPTION FOR IMPLANTED CARDIAC DEVICE PROGRAMMING   Patient Information: Name: Blake Burgess, Blake Burgess  DOB: 23-Sep-1931  MRN: [id number]    .Planned Procedure:  Multiple extractions with alveoloplasty  Surgeon:  Sandi Mariscal, DMD  Date of Procedure:  02/03/21  Cautery will be used.  Position during surgery:  Supine   Please send documentation back to:  [facility] (Fax # [phone])   Marveen Reeks, RN  02/02/2021 7:01 PM    Device Information:   Clinic EP Physician:   Cristopher Peru, MD Device Type:  Pacemaker Manufacturer and Phone #:  Medtronic: [phone] Pacemaker Dependent?:  No Date of Last Device Check:  01/22/21        Normal Device Function?:  Yes     Electrophysiologist's Recommendations:   Have magnet available. Provide continuous ECG monitoring when magnet is used or reprogramming is to be performed.  Procedure may interfere with device function.  Magnet should be placed over device during procedure.  Per Device Clinic Standing Orders, Drake Leach  02/03/2021 11:00 AM

## 2021-02-03 NOTE — Op Note (Signed)
Department of Dental Medicine    OPERATIVE REPORT  DATE OF SURGERY:   02/03/2021  PATIENT'S NAME:   Blake Burgess DATE OF BIRTH:   [birth year] MEDICAL RECORD NUMBER: [id number]  SURGEON:   Danzel Marszalek B. Benson [place], D.M.D.  ASSISTANT:  Molli Posey, DAII  PREOPERATIVE DIAGNOSES:  Dental caries, chronic periodontitis  Patient Active Problem List   Diagnosis Date Noted   Persistent atrial fibrillation ([place]) 11/19/2020   Precordial chest pain 11/19/2020   Pacemaker 11/06/2019   Educated about COVID-19 virus infection 12/12/2018   Nonrheumatic aortic valve stenosis 12/12/2018   Actinic skin damage 10/13/2018   Back pain 10/13/2018   Cancer ([place]) 10/13/2018   Diverticulosis 10/13/2018   Spinal stenosis 10/13/2018   Hearing deficit 10/13/2018   Complete atrioventricular block ([place]) 10/13/2018   Dyspnea 10/13/2018   S/P shoulder replacement, right 08/25/2018   History of artificial joint 08/25/2018   Preop cardiovascular exam 07/13/2018   Coronary artery disease involving native coronary artery of native heart without angina pectoris 06/05/2018   Chronic combined systolic and diastolic heart failure ([place]) 06/05/2018   Dyslipidemia 06/05/2018   Pain in joint of right shoulder 05/09/2018   Osteoarthritis of right glenohumeral joint 05/09/2018   ................................................................................... 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   Cardiac pacemaker in situ 77/82/4235   Acute diastolic (congestive) heart failure ([place]) 36/14/4315   Acute diastolic heart failure ([place]) 10/11/2017   CHB (complete heart block) ([facility])    SOB (shortness of breath)    Dyslipidemia, goal LDL below 70    Risk for falls 12/21/2016   Parent-foster child problem 11/25/2016   Lumbar stenosis with neurogenic claudication 08/04/2016   Lightheadedness 02/03/2016   Right ear pain 03/22/2013   Shingles 03/22/2013   Other  specified cardiac arrhythmias 02/19/2013   Old myocardial infarction 02/19/2013   History of total hip replacement 02/19/2013   History of TIA (transient ischemic attack) 02/19/2013   Type II diabetes mellitus ([place]) 02/19/2013   Generalized ischemic cerebrovascular disease 02/19/2013   Heart disease 02/19/2013   Status post aorto-coronary artery bypass graft 02/19/2013   Anemia 02/18/2013   B12 deficiency 02/18/2013   Kidney stones 02/18/2013   GERD (gastroesophageal reflux disease) 02/18/2013   Esophageal stricture 02/18/2013   Stroke ([place]) 02/18/2013   DJD (degenerative joint disease) 02/18/2013   Hyperlipidemia 02/18/2013   Arthritis 01/10/2013   Angina pectoris ([place]) 12/14/2010   Malaise and fatigue 10/19/2010   COUGH 08/07/2010   ANEMIA, SECONDARY TO ACUTE BLOOD LOSS 03/03/2010   GI BLEED 03/03/2010   Osteoarthritis 03/03/2010   Gastrointestinal hemorrhage, unspecified 03/03/2010   Atherosclerotic heart disease of native coronary artery without angina pectoris 02/23/2010   DIZZINESS 12/04/2009   Essential hypertension 02/05/2009   Hx of CABG-2008 02/05/2009   History of coronary artery bypass graft 02/05/2009   H/O acute myocardial infarction 08/23/2006   POSTOPERATIVE DIAGNOSES:  Dental caries, chronic periodontitis  PROCEDURES PERFORMED: Extractions of teeth numbers 6, 9, 10, 11, 12, 15 and 25 1 Quadrant of alveoloplasty (ULQ)  ANESTHESIA:  General anesthesia via endotracheal tube.  MEDICATIONS: Ancef 2 g IV prior to invasive dental procedures. Local anesthesia with a total utilization of 3 cartridges of 34 mg of lidocaine with 0.018 mg of epinephrine/ea.  SPECIMENS:  7 teeth that were extracted and discarded  DRAINS/CULTURES:  None  COMPLICATIONS:  None  ESTIMATED BLOOD LOSS:  5 mL  INTRAVENOUS FLUIDS:  10 mL of Lactated ringers solution  INDICATIONS:  The patient was recently diagnosed with severe aortic stenosis.  A medically necessary dental consult was  then requested to evaluate the patient for any dental/orofacial infection and their overall oral health.  The patient was examined and subsequently treatment planned for multiple extractions of infected teeth and teeth with guarded/poor prognosis.  This treatment plan was made to decrease the perioperative and postoperative risks and complications associated with dental/orofacial infection from affecting the patient's systemic health.  OPERATIVE FINDINGS:  The patient was examined in operating room number 11.  The indicated teeth were identified and verified for extraction. The patient was noted be affected by severe dental decay, chronic periodontitis and multiple retained dental roots.  DESCRIPTION OF PROCEDURE:  The patient was identified in the holding area and brought to the main operating room number 11 by the anesthesia team. The patient was then placed in the supine position on the operating table.  General anesthesia was then induced per the anesthesia team. The patient was then prepped and draped in the usual sterile fashion for dental medicine procedures.  A timeout was performed. The patient was identified and procedures were verified. A throat pack was placed at this time. The oral cavity was then thoroughly examined with the findings noted above. The patient was then ready for the dental medicine procedure as follows:   ANESTHESIA: Local anesthesia was administered sequentially with a total utilization of 3 cartridges each containing 34 mg of lidocaine with 0.018 mg of epinephrine.  ROUTINE EXTRACTIONS: The maxillary left and right quadrants were first approached. The teeth were then subluxated with a series of straight elevators.  Teeth numbers 6, 9, 10, 11, 12 and 15 were then removed with a 150 forceps and rongeurs without complications.  Alveoloplasty was then performed utilizing a ronguers and bone file in the upper left quadrant.  The tissues were approximated and trimmed appropriately  to help achieve primary closure.  The surgical sites were then irrigated with copious amounts of sterile saline.  Surgi Foam was placed in each extraction site.   The surgical sites were closed using 3-0 chromic gut sutures as follows: #6 1 simple interrupted, #9-#12 continuous interlocking + 3 simple interrupted, #15 1 simple interrupted.  The mandibular right quadrant was then approached. The teeth were subluxated with a series of straight elevators.  Tooth number 25 was then removed utilizing a 151 forceps without complications. The tissues were approximated and trimmed appropriately to help achieve primary closure. The surgical site was then irrigated with copious amounts of sterile saline.  Surgi Foam was placed in the extraction site.  The surgical site was closed using 3-0 chromic gut sutures as follows: 1 simple interrupted.   END OF PROCEDURE: Thorough oral irrigation with sterile saline was performed.  Good hemostasis was observed.  The patient was examined for complications, and seeing none, the dental medicine procedure was deemed to be complete.  The throat pack was removed at this time. An oral airway was then placed at the request of the anesthesia team.  A series of 4x4 gauze were placed in the mouth to aid hemostasis as needed.  The patient was then handed over to the anesthesia team for final disposition.  After an appropriate amount of time, the patient was extubated and taken to the postanesthsia care unit in stable condition.  All counts were correct for the dental medicine procedure.     Leota Benson Norway, D.M.D.

## 2021-02-03 NOTE — Progress Notes (Signed)
Dr. Tobias Alexander notified no information received regarding patient's pacemaker; MD stated ok to proceed. No new orders at this time.

## 2021-02-04 ENCOUNTER — Encounter (HOSPITAL_COMMUNITY): Payer: Self-pay | Admitting: Dentistry

## 2021-02-05 ENCOUNTER — Telehealth: Payer: Self-pay | Admitting: Dentistry

## 2021-02-05 ENCOUNTER — Ambulatory Visit: Payer: Medicare HMO | Admitting: Pulmonary Disease

## 2021-02-05 ENCOUNTER — Encounter: Payer: Self-pay | Admitting: Pulmonary Disease

## 2021-02-05 ENCOUNTER — Other Ambulatory Visit: Payer: Self-pay

## 2021-02-05 VITALS — BP 140/82 | HR 52 | Ht 69.0 in | Wt 166.5 lb

## 2021-02-05 DIAGNOSIS — R0600 Dyspnea, unspecified: Secondary | ICD-10-CM | POA: Diagnosis not present

## 2021-02-05 DIAGNOSIS — R0609 Other forms of dyspnea: Secondary | ICD-10-CM

## 2021-02-05 DIAGNOSIS — J449 Chronic obstructive pulmonary disease, unspecified: Secondary | ICD-10-CM

## 2021-02-05 DIAGNOSIS — R0989 Other specified symptoms and signs involving the circulatory and respiratory systems: Secondary | ICD-10-CM

## 2021-02-05 DIAGNOSIS — R0602 Shortness of breath: Secondary | ICD-10-CM | POA: Diagnosis not present

## 2021-02-05 DIAGNOSIS — R059 Cough, unspecified: Secondary | ICD-10-CM

## 2021-02-05 MED ORDER — TRELEGY ELLIPTA 100-62.5-25 MCG/INH IN AEPB: 1.0000 | INHALATION_SPRAY | Freq: Every day | RESPIRATORY_TRACT | 0 refills | Status: DC

## 2021-02-05 MED ORDER — TRELEGY ELLIPTA 100-62.5-25 MCG/INH IN AEPB: 1.0000 | INHALATION_SPRAY | Freq: Every day | RESPIRATORY_TRACT | 1 refills | Status: DC

## 2021-02-05 NOTE — Patient Instructions (Signed)
Thank you for visiting Dr. Valeta Harms at [facility]. Today we recommend the following:  Trelegy samples Albuterol inhaler for as needed use Albuterol nebs and new nebulizer machine   Return in about 3 months (around 05/08/2021) for Dr. Valeta Harms.    Please do your part to reduce the spread of COVID-19.

## 2021-02-05 NOTE — Progress Notes (Signed)
Synopsis: Referred in June 2022 for abnormal PFTs, PCP: By Minus Breeding, MD  Subjective:   PATIENT ID: Blake Burgess GENDER: male DOB: 1931-11-06, MRN: [id number]  Chief Complaint  Patient presents with   Consult    Abnormal PFT on 01/14/2021    This is an 87 year old gentleman, history of mild aortic stenosis, coronary artery disease status post CABG, gastroesophageal reflux, hypertension, pacemaker placement.  Patient referred for evaluation of abnormal PFTs.  Patient follows with Dr. Percival Spanish from cardiology.  Pulmonary function tests were completed on 01/14/2021.  PFTs revealed a ratio of 34, an FEV1 of 40% predicted with a value of 1 L no significant bronchodilator response, TLC 102% predicted, RV 151% predicted consistent with air trapping and a DLCO of 44% predicted.  Patient's last chest x-ray was completed in March 2022 small amount of atelectasis with associated right-sided effusion.  OV 02/05/2021: Patient has no significant respiratory complaints today except for ongoing dry cough.  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  He is no longer doing this but has had a significant occupational dust exposure throughout his life.   Past Medical History:  Diagnosis Date   Aortic stenosis    mild AS 09/2017 echo   Cancer [facility])    skin   Coronary artery disease    a.  s/p CABG;   b. cath 4/12: EF 55%, 3vCAD, patent L-LAD, patent S-RCA, patent S-CFX (done after a false pos. ETT)   Diverticular disease    GERD (gastroesophageal reflux disease)    GI bleed    Hemorrhoids    HH (hiatus hernia)    History of kidney stones    Hypertension    Osteoarthritis    Other and unspecified hyperlipidemia    Presence of permanent cardiac pacemaker    Schatzki's ring    Stroke [facility])      Family History  Problem Relation Age of Onset   Heart attack  Mother    Hypertension Mother    Diabetes Father    Diabetes Brother    Diabetes Sister      Past Surgical History:  Procedure Laterality Date   ARTERIOVENOUS GRAFT PLACEMENT W/ ENDOSCOPIC VEIN HARVEST     of the right leg greater spahenous vein. Surgeon: Tharon Aquas Trigt,M.D.   COLONOSCOPY  02/24/2010   Hemorrhoids, Diverticulosis. Performed at [place]. Normal terminal ileum. Dr. June Leap, [place] ARTERY BYPASS GRAFT  06/21/2007   CABG x 3 Surgeon Ivin Poot, MD   EYE SURGERY     bilateral cataract removal   hip replace  06/09/2004   left hip Surgeon Pietro Cassis. Alvan Dame, MD   LEFT HEART CATH AND CORS/GRAFTS ANGIOGRAPHY N/A 11/04/2020   Procedure: LEFT HEART CATH AND CORS/GRAFTS ANGIOGRAPHY;  Surgeon: Troy Sine, MD;  Location: [facility];  Service: Cardiovascular;  Laterality: N/A;   MULTIPLE EXTRACTIONS WITH ALVEOLOPLASTY N/A 02/03/2021   Procedure: MULTIPLE EXTRACTION WITH ALVEOLOPLASTY;  Surgeon: Charlaine Dalton, DMD;  Location: [facility];  Service: Dentistry;  Laterality: N/A;   PACEMAKER IMPLANT N/A 10/03/2017   Procedure: PACEMAKER IMPLANT;  Surgeon: Evans Lance, MD;  Location: [facility];  Service: Cardiovascular;  Laterality: N/A;   REVERSE SHOULDER ARTHROPLASTY Right 08/25/2018   Procedure: REVERSE SHOULDER ARTHROPLASTY;  Surgeon: Netta Cedars, MD;  Location: [facility];  Service: Orthopedics;  Laterality: Right;    Social History   Socioeconomic History   Marital status: Married    Spouse name: Not on file   Number of children: 2   Years of education: Not on file   Highest education level: Not on file  Occupational History   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 Daily Caffeine: 24 oz pepsi and 1 cup coffee.    Social Determinants of Health   Financial Resource Strain: Not on file  Food Insecurity: Not on file  Transportation Needs: Not on file  Physical Activity: Not on file  Stress: Not on file  Social Connections: Not on file  Intimate Partner Violence: Not on file     No Known Allergies   Outpatient Medications Prior to Visit  Medication Sig Dispense Refill   acetaminophen (TYLENOL) 500 MG tablet Take 1,000 mg by mouth every 6 (six) hours as needed for moderate pain.     amLODipine (NORVASC) 2.5 MG tablet Take 2.5 mg by mouth in the morning.     amoxicillin (AMOXIL) 500 MG capsule Take 1 capsule (500 mg total) by mouth 3 (three) times daily for 7 days. Take two capsules now, then one capsule by mouth every 8 hours until all gone. 22 capsule 0   apixaban (ELIQUIS) 5 MG TABS tablet Take 1 tablet (5 mg total) by mouth 2 (two) times daily. 60 tablet 3   atorvastatin (LIPITOR) 40 MG tablet Take 40 mg by mouth in the morning.     cyanocobalamin (,VITAMIN B-12,) 1000 MCG/ML injection Inject 1,000 mcg into the muscle every 30 (thirty) days.     donepezil (ARICEPT) 10 MG tablet Take 10 mg by mouth at bedtime.     enalapril (VASOTEC) 20 MG tablet Take 20 mg by mouth 2 (two) times daily.     furosemide (LASIX) 40 MG tablet Take 40 mg by mouth in the morning.     isosorbide mononitrate (IMDUR) 60 MG 24 hr tablet Take 1 tablet (60 mg total) by mouth daily. (Patient taking differently: Take 60 mg by mouth in the morning.) 90 tablet 3   Lidocaine 4 % PTCH Apply 1 patch topically daily as needed (pain).     metoprolol succinate (TOPROL-XL) 50 MG 24 hr tablet Take 1 tablet (50 mg total) by mouth daily. (Patient taking differently: Take 50 mg by mouth in the morning.) 90 tablet 3   nitroGLYCERIN (NITROSTAT) 0.4 MG SL tablet Place 1 tablet (0.4 mg total) under the tongue every 5 (five) minutes as needed for chest pain. (Patient taking  differently: Place 0.4 mg under the tongue every 5 (five) minutes x 3 doses as needed for chest pain.) 25 tablet 3   omeprazole (PRILOSEC) 20 MG capsule Take 20 mg by mouth in the morning.     tamsulosin (FLOMAX) 0.4 MG CAPS capsule Take 0.4 mg by mouth in the morning.     No facility-administered medications prior to visit.    Review of Systems  Constitutional:  Negative for chills, fever, malaise/fatigue and weight loss.  HENT:  Negative for hearing loss, sore throat and tinnitus.   Eyes:  Negative  for blurred vision and double vision.  Respiratory:  Positive for cough and shortness of breath. Negative for hemoptysis, sputum production, wheezing and stridor.   Cardiovascular:  Negative for chest pain, palpitations, orthopnea, leg swelling and PND.  Gastrointestinal:  Negative for abdominal pain, constipation, diarrhea, heartburn, nausea and vomiting.  Genitourinary:  Negative for dysuria, hematuria and urgency.  Musculoskeletal:  Negative for joint pain and myalgias.  Skin:  Negative for itching and rash.  Neurological:  Negative for dizziness, tingling, weakness and headaches.  Endo/Heme/Allergies:  Negative for environmental allergies. Does not bruise/bleed easily.  Psychiatric/Behavioral:  Negative for depression. The patient is not nervous/anxious and does not have insomnia.   All other systems reviewed and are negative.   Objective:  Physical Exam Vitals reviewed.  Constitutional:      General: He is not in acute distress.    Appearance: He is well-developed.  HENT:     Head: Normocephalic and atraumatic.  Eyes:     General: No scleral icterus.    Conjunctiva/sclera: Conjunctivae normal.     Pupils: Pupils are equal, round, and reactive to light.  Neck:     Vascular: No JVD.     Trachea: No tracheal deviation.  Cardiovascular:     Rate and Rhythm: Normal rate and regular rhythm.     Heart sounds: Murmur heard.  Pulmonary:     Effort: Pulmonary effort is normal. No  tachypnea, accessory muscle usage or respiratory distress.     Breath sounds: No stridor. No wheezing, rhonchi or rales.     Comments: Bibasilar crackles Abdominal:     General: Bowel sounds are normal. There is no distension.     Palpations: Abdomen is soft.     Tenderness: There is no abdominal tenderness.  Musculoskeletal:        General: No tenderness.     Cervical back: Neck supple.  Lymphadenopathy:     Cervical: No cervical adenopathy.  Skin:    General: Skin is warm and dry.     Capillary Refill: Capillary refill takes less than 2 seconds.     Findings: No rash.  Neurological:     Mental Status: He is alert and oriented to person, place, and time.  Psychiatric:        Behavior: Behavior normal.     Vitals:   02/05/21 1056  BP: 140/82  Pulse: (!) 52  SpO2: 97%  Weight: 166 lb 8 oz (75.5 kg)  Height: 5\' 9"  (1.753 m)   97% on RA BMI Readings from Last 3 Encounters:  02/05/21 24.59 kg/m  02/03/21 24.66 kg/m  01/27/21 23.98 kg/m   Wt Readings from Last 3 Encounters:  02/05/21 166 lb 8 oz (75.5 kg)  02/03/21 167 lb (75.8 kg)  01/27/21 162 lb 6.4 oz (73.7 kg)     CBC    Component Value Date/Time   WBC 5.4 11/12/2020 1037   RBC 3.54 (L) 11/12/2020 1037   HGB 10.9 (L) 11/12/2020 1037   HGB 10.4 (L) 11/03/2020 1503   HCT 34.5 (L) 11/12/2020 1037   HCT 31.3 (L) 11/03/2020 1503   PLT 291 11/12/2020 1037   PLT 293 11/03/2020 1503   MCV 97.5 11/12/2020 1037   MCV 95 11/03/2020 1503   MCH 30.8 11/12/2020 1037   MCHC 31.6 11/12/2020 1037   RDW 13.0 11/12/2020 1037   RDW 13.0 11/03/2020 1503   LYMPHSABS 2.3 05/22/2018 1541   MONOABS 0.8 05/22/2018 1541   EOSABS 0.1 05/22/2018 1541   BASOSABS  0.0 05/22/2018 1541     Chest Imaging: No recent axial chest imaging. Chest x-ray from March 2022 reviewed: Bibasilar areas of interstitial markings.  Possible right lower lobe mild atelectasis, small effusion. The patient's images have been independently reviewed  by me.    Pulmonary Functions Testing Results: PFT Results Latest Ref Rng & Units 01/14/2021  FVC-Pre L 2.85  FVC-Predicted Pre % 79  FVC-Post L 2.97  FVC-Predicted Post % 82  Pre FEV1/FVC % % 45  Post FEV1/FCV % % 34  FEV1-Pre L 1.29  FEV1-Predicted Pre % 51  FEV1-Post L 1.00  DLCO uncorrected ml/min/mmHg 10.20  DLCO UNC% % 44  DLVA Predicted % 62  TLC L 7.08  TLC % Predicted % 102  RV % Predicted % 151    FeNO:   Pathology:   Echocardiogram:   12/10/2020: IMPRESSIONS   1. Left ventricular ejection fraction, by estimation, is 45 to 50%. The  left ventricle has mildly decreased function. The left ventricle  demonstrates regional wall motion abnormalities (see scoring  diagram/findings for description). Left ventricular  diastolic parameters are consistent with Grade II diastolic dysfunction  (pseudonormalization). Elevated left atrial pressure. There is moderate  hypokinesis of the left ventricular, basal inferior wall and inferolateral  wall.   2. Right ventricular systolic function is normal. The right ventricular  size is normal. There is normal pulmonary artery systolic pressure. The  estimated right ventricular systolic pressure is 84.5 mmHg.   3. Left atrial size was moderately dilated.   4. Right atrial size was moderately dilated.   5. The mitral valve is normal in structure. Mild mitral valve  regurgitation.   6. The aortic valve is tricuspid. There is severe calcifcation of the  aortic valve. There is severe thickening of the aortic valve. Aortic valve  regurgitation is not visualized. Moderate to severe aortic valve stenosis.   7. The inferior vena cava is normal in size with greater than 50%  respiratory variability, suggesting right atrial pressure of 3 mmHg.   Heart Catheterization:  11/04/2020 Mid LM to Prox LAD lesion is 95% stenosed. Mid LAD lesion is 100% stenosed. Dist LAD lesion is 25% stenosed. Ost Cx to Prox Cx lesion is 80% stenosed. Mid Cx  lesion is 90% stenosed. 2nd Mrg lesion is 50% stenosed. Prox RCA lesion is 95% stenosed. Mid RCA lesion is 100% stenosed.     Assessment & Plan:     ICD-10-CM   1. Stage 3 severe COPD by GOLD classification ([place])  J44.9     2. SOB (shortness of breath)  R06.02     3. DOE (dyspnea on exertion)  R06.00     4. Bibasilar crackles  R09.89     5. 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  He has a PFTs consistent with severe obstructive disease with a reduced FEV1 FVC.  I suspect he has a mixed obstructive and restrictive pattern.  His DLCO is also low but his chest x-ray does not show any significant emphysema.  He has not had axial CT imaging.  His DLCO may be low due to elevated pulmonary pressures versus interstitial lung disease.  I suspect we may be dealing with a interstitial lung disease.  Plan: He is scheduled for a CT scan with aortogram for planning of valve replacement by Dr. Angelena Form. For his obstructive disease and ongoing respiratory  symptoms we will give him an inhaler to try. Start Trelegy 100 As needed albuterol for shortness of breath wheezing and cough. New nebulizer machine with albuterol solution to use as needed. Patient return to see [place] in clinic after valve replacement. I suspect 80-month follow-up should be good. Can be seen by me or APP in follow-up.   Current Outpatient Medications:    acetaminophen (TYLENOL) 500 MG tablet, Take 1,000 mg by mouth every 6 (six) hours as needed for moderate pain., Disp: , Rfl:    amLODipine (NORVASC) 2.5 MG tablet, Take 2.5 mg by mouth in the morning., Disp: , Rfl:    amoxicillin (AMOXIL) 500 MG capsule, Take 1 capsule (500 mg total) by mouth 3 (three) times daily for 7 days. Take two capsules now, then one capsule by mouth every 8 hours until all gone., Disp: 22 capsule, Rfl: 0   apixaban (ELIQUIS)  5 MG TABS tablet, Take 1 tablet (5 mg total) by mouth 2 (two) times daily., Disp: 60 tablet, Rfl: 3   atorvastatin (LIPITOR) 40 MG tablet, Take 40 mg by mouth in the morning., Disp: , Rfl:    cyanocobalamin (,VITAMIN B-12,) 1000 MCG/ML injection, Inject 1,000 mcg into the muscle every 30 (thirty) days., Disp: , Rfl:    donepezil (ARICEPT) 10 MG tablet, Take 10 mg by mouth at bedtime., Disp: , Rfl:    enalapril (VASOTEC) 20 MG tablet, Take 20 mg by mouth 2 (two) times daily., Disp: , Rfl:    furosemide (LASIX) 40 MG tablet, Take 40 mg by mouth in the morning., Disp: , Rfl:    isosorbide mononitrate (IMDUR) 60 MG 24 hr tablet, Take 1 tablet (60 mg total) by mouth daily. (Patient taking differently: Take 60 mg by mouth in the morning.), Disp: 90 tablet, Rfl: 3   Lidocaine 4 % PTCH, Apply 1 patch topically daily as needed (pain)., Disp: , Rfl:    metoprolol succinate (TOPROL-XL) 50 MG 24 hr tablet, Take 1 tablet (50 mg total) by mouth daily. (Patient taking differently: Take 50 mg by mouth in the morning.), Disp: 90 tablet, Rfl: 3   nitroGLYCERIN (NITROSTAT) 0.4 MG SL tablet, Place 1 tablet (0.4 mg total) under the tongue every 5 (five) minutes as needed for chest pain. (Patient taking differently: Place 0.4 mg under the tongue every 5 (five) minutes x 3 doses as needed for chest pain.), Disp: 25 tablet, Rfl: 3   omeprazole (PRILOSEC) 20 MG capsule, Take 20 mg by mouth in the morning., Disp: , Rfl:    tamsulosin (FLOMAX) 0.4 MG CAPS capsule, Take 0.4 mg by mouth in the morning., Disp: , Rfl:   I spent 62 minutes dedicated to the care of this patient on the date of this encounter to include pre-visit review of records, face-to-face time with the patient discussing conditions above, post visit ordering of testing, clinical documentation with the electronic health record, making appropriate referrals as documented, and communicating necessary findings to members of the patients care team.   Garner Nash,  DO [facility] 02/05/2021 11:09 AM

## 2021-02-05 NOTE — Telephone Encounter (Signed)
02/05/21     1352  Contacted patient's son to see how his father was doing after his dental procedures in the OR on 6/14 and to schedule a postoperative visit.  No answer, left v/m.   [place], DMD

## 2021-02-05 NOTE — Progress Notes (Signed)
Remote pacemaker transmission.   

## 2021-02-11 ENCOUNTER — Telehealth: Payer: Self-pay | Admitting: Pulmonary Disease

## 2021-02-11 MED ORDER — ALBUTEROL SULFATE (2.5 MG/3ML) 0.083% IN NEBU: 2.5000 mg | INHALATION_SOLUTION | Freq: Four times a day (QID) | RESPIRATORY_TRACT | 12 refills | Status: DC | PRN

## 2021-02-11 NOTE — Telephone Encounter (Signed)
I called and spoke with pt's son and he stated that they have gone to get the albuterol for the nebulizer.  He stated that the pt is scheduled to have the CT scan done, carotid dopplers and OV for physical rehab to see what he is capable of doing on Friday.  I advised the pts son to call us back on Friday if they feel that he still needs to be seen.  He stated that the cardiologist told them that once he had the valve replaced that his breathing would be better.  PT's son is aware that if the breathing gets worse to seek emergency care at [facility] or ER.

## 2021-02-11 NOTE — Telephone Encounter (Signed)
Seen in ED recently for shortness of breath. His labs, chest xray and heart markers were normal. Continue Trelegy, we can send in RX for albuterol.  He would need office visit to sort out. Recommend he contact cariology as shortness of breath could be related to his heart as well

## 2021-02-11 NOTE — Telephone Encounter (Signed)
Pts wife stated that he has a nebulizer machine received it Friday but does not have any medicine and his wife stated that he has been struggling with his breathe today. Pls regard; [phone]

## 2021-02-11 NOTE — Telephone Encounter (Signed)
Spoke with pt who states increasing SOB x 3 days with today being the worst day yet. Spoke with pt's wife who states pt has used albuterol inhaler without any noted relief. Pt wife states they have not gotten albuterol nebulizer solution. RN checked Epic and not order for albuterol solution order was noted so RN placed order. Pt's O2 saturation was 95% on RA. Pt has been using Trelegy daily. Beth can you please advise since Dr. Valeta Harms is not available? Pt's wife instructed if pt's symptoms get any worse pt needs to seek medical evaluation at [facility] or ER

## 2021-02-13 ENCOUNTER — Encounter (HOSPITAL_COMMUNITY): Payer: Self-pay

## 2021-02-13 ENCOUNTER — Ambulatory Visit (HOSPITAL_COMMUNITY)
Admission: RE | Admit: 2021-02-13 | Discharge: 2021-02-13 | Disposition: A | Discharge status: Home / Self Care | Payer: Medicare HMO | Source: Ambulatory Visit | Attending: Cardiovascular Disease | Admitting: Cardiovascular Disease

## 2021-02-13 ENCOUNTER — Other Ambulatory Visit: Payer: Self-pay

## 2021-02-13 ENCOUNTER — Ambulatory Visit: Payer: Medicare HMO | Attending: Cardiovascular Disease

## 2021-02-13 DIAGNOSIS — R2681 Unsteadiness on feet: Secondary | ICD-10-CM | POA: Insufficient documentation

## 2021-02-13 DIAGNOSIS — I350 Nonrheumatic aortic (valve) stenosis: Secondary | ICD-10-CM

## 2021-02-13 DIAGNOSIS — M6281 Muscle weakness (generalized): Secondary | ICD-10-CM | POA: Insufficient documentation

## 2021-02-13 MED ORDER — IOHEXOL 350 MG/ML SOLN
100.0000 mL | Freq: Once | INTRAVENOUS | Status: AC | PRN
  Administered 2021-02-13: 100 mL via INTRAVENOUS

## 2021-02-13 NOTE — Progress Notes (Signed)
Carotid artery duplex completed. Refer to "CV Proc" under chart review to view preliminary results.  02/13/2021 9:09 AM Kelby Aline., MHA, RVT, RDCS, RDMS

## 2021-02-13 NOTE — Therapy (Signed)
[place], [place], [address] Phone: [phone]   Fax:  [phone]  Physical Therapy Evaluation  Patient Details  Name: Blake Burgess MRN: [id number] Date of Birth: 1932/03/28 Referring Provider (PT): Lauree Chandler D   Encounter Date: 02/13/2021   PT End of Session - 02/13/21 1310     Visit Number 1    Number of Visits 1    Date for PT Re-Evaluation 02/14/21    PT Start Time 1215    PT Stop Time 1300    PT Time Calculation (min) 45 min    Equipment Utilized During Treatment Gait belt    Activity Tolerance Patient tolerated treatment well;Patient limited by fatigue    Behavior During Therapy [facility] for tasks assessed/performed             Past Medical History:  Diagnosis Date   Aortic stenosis    mild AS 09/2017 echo   Cancer [facility])    skin   Coronary artery disease    a.  s/p CABG;   b. cath 4/12: EF 55%, 3vCAD, patent L-LAD, patent S-RCA, patent S-CFX (done after a false pos. ETT)   Diverticular disease    GERD (gastroesophageal reflux disease)    GI bleed    Hemorrhoids    HH (hiatus hernia)    History of kidney stones    Hypertension    Osteoarthritis    Other and unspecified hyperlipidemia    Presence of permanent cardiac pacemaker    Schatzki's ring    Stroke [facility])     Past Surgical History:  Procedure Laterality Date   ARTERIOVENOUS GRAFT PLACEMENT W/ ENDOSCOPIC VEIN HARVEST     of the right leg greater spahenous vein. Surgeon: Tharon Aquas Trigt,M.D.   COLONOSCOPY  02/24/2010   Hemorrhoids, Diverticulosis. Performed at [place]. Normal terminal ileum. Dr. June Leap, [place] ARTERY BYPASS GRAFT  06/21/2007   CABG x 3 Surgeon Ivin Poot, MD   EYE SURGERY     bilateral cataract removal   hip replace  06/09/2004   left hip Surgeon Pietro Cassis. Alvan Dame, MD   LEFT HEART CATH AND CORS/GRAFTS ANGIOGRAPHY N/A 11/04/2020   Procedure: LEFT HEART CATH AND CORS/GRAFTS  ANGIOGRAPHY;  Surgeon: Troy Sine, MD;  Location: [place] [facility];  Service: Cardiovascular;  Laterality: N/A;   MULTIPLE EXTRACTIONS WITH ALVEOLOPLASTY N/A 02/03/2021   Procedure: MULTIPLE EXTRACTION WITH ALVEOLOPLASTY;  Surgeon: Charlaine Dalton, DMD;  Location: [place];  Service: Dentistry;  Laterality: N/A;   PACEMAKER IMPLANT N/A 10/03/2017   Procedure: PACEMAKER IMPLANT;  Surgeon: Evans Lance, MD;  Location: [place] [facility];  Service: Cardiovascular;  Laterality: N/A;   REVERSE SHOULDER ARTHROPLASTY Right 08/25/2018   Procedure: REVERSE SHOULDER ARTHROPLASTY;  Surgeon: Netta Cedars, MD;  Location: [place];  Service: Orthopedics;  Laterality: Right;    There were no vitals filed for this visit.    Subjective Assessment - 02/13/21 1227     Subjective COPD and aortic valve stenosis leading to SOB lasting several years. Pt reports his primary symptom is SOB, which is present at baseline but exacerbated with physical activity.    Patient is accompained by: Family member   Son   Currently in Pain? Yes    Pain Score 8     Pain Location Hip    Pain Orientation Left    Pain Descriptors / Indicators Aching;Constant    Pain Type Chronic pain  Pain Onset More than a month ago    Pain Frequency Intermittent    Multiple Pain Sites No                [facility] PT Assessment - 02/13/21 0001       Assessment   Medical Diagnosis Severe Aortic Stenosis    Referring Provider (PT) Lauree Chandler D    Hand Dominance Right    Next MD Visit 02/20/2021      Precautions   Precautions None      Restrictions   Weight Bearing Restrictions No      Balance Screen   Has the patient fallen in the past 6 months Yes    How many times? 2    Has the patient had a decrease in activity level because of a fear of falling?  Yes    Is the patient reluctant to leave their home because of a fear of falling?  No      Home Ecologist residence    Living  Arrangements Spouse/significant other    Available Help at Discharge Family    Type of [place] to enter    Entrance Stairs-Number of Steps 1    Entrance Stairs-Rails Right    [place] One level    [place] - single point;Walker - 2 wheels      AROM   Overall AROM  Within functional limits for tasks performed    Overall AROM Comments --      Strength   Overall Strength Comments BIL UE strength grossly 5/5; BIL LE strength grossly 4/5    Strength Assessment Site Hand    Right Hand Grip (lbs) 52    Left Hand Grip (lbs) 46              [facility] Pre-Surgical Assessment - 02/13/21 0001     5 Meter Walk Test- trial 1 7 sec    5 Meter Walk Test- trial 2 7 sec.     5 Meter Walk Test- trial 3 6 sec.    5 meter walk test average 6.67 sec    4 Stage Balance Test tolerated for:  10 sec.    4 Stage Balance Test Position 2    Sit To Stand Test- trial 1 18 sec.    BP (mmHg) 125/55    HR (bpm) 78    02 Sat (%RA) 96 %    Modified Borg Scale for Dyspnea 4- somewhat severe    Perceived Rate of Exertion (Borg) 6-    BP (mmHg) 165/47    HR (bpm) 52    02 Sat (%RA) 99 %    Modified Borg Scale for Dyspnea 10- Shortness of breath so severe you need to stop    Perceived Rate of Exertion (Borg) 17- Very hard    Aerobic Endurance Distance Walked 422    Endurance additional comments 69.15% of diability for age-related norm                      Objective measurements completed on examination: See above findings.                            Plan - 02/13/21 1311     Clinical Impression Statement See assessment in note.    Stability/Clinical Decision Making Stable/Uncomplicated    Clinical Decision Making Low  PT Frequency One time visit    PT Next Visit Plan Pre-TAVR assessment            Clinical Impression Statement: Pt is a 87 yo M presenting to OP PT for evaluation prior to possible TAVR surgery due to severe  aortic stenosis. Pt reports onset of SOB several years ago. Symptoms are limiting Pt's walking endurance. Pt presents with WNL ROM and decreased LE strength, poor balance and is assessed as high at the high fall risk 4 stage balance test, low walking speed and poor aerobic endurance per 6 minute walk test. Pt ambulated 422 feet in 3:07 before requesting a seated rest beak lasting the remainder of the test. At time of rest, patient's HR was 52 bpm and O2 was 99% on room air. Pt reported 10/10 shortness of breath on modified scale for dyspnea. Pt ambulated a total of 422 feet in 6 minute walk. SOB and knee pain increased significantly with 6 minute walk test. Based on the Short Physical Performance Battery, patient has a frailty rating of 6/12 with </= 5/12 considered frail.    Patient demonstrated the following deficits and impairments:     Visit Diagnosis: Muscle weakness (generalized)  Unsteadiness on feet     Problem List Patient Active Problem List   Diagnosis Date Noted   [place]    Chronic apical periodontitis    Retained dental root    Chronic periodontitis    Persistent atrial fibrillation ([place]) 11/19/2020   Precordial chest pain 11/19/2020   Pacemaker 11/06/2019   Educated about COVID-19 virus infection 12/12/2018   Nonrheumatic aortic valve stenosis 12/12/2018   Actinic skin damage 10/13/2018   Back pain 10/13/2018   Cancer ([place]) 10/13/2018   Diverticulosis 10/13/2018   Spinal stenosis 10/13/2018   Hearing deficit 10/13/2018   Complete atrioventricular block ([place]) 10/13/2018   Dyspnea 10/13/2018   S/P shoulder replacement, right 08/25/2018   History of artificial joint 08/25/2018   Preop cardiovascular exam 07/13/2018   Coronary artery disease involving native coronary artery of native heart without angina pectoris 06/05/2018   Chronic combined systolic and diastolic heart failure ([place]) 06/05/2018   Dyslipidemia 06/05/2018   Pain in joint of right shoulder 05/09/2018    Osteoarthritis of right glenohumeral joint 05/09/2018   ................................................................................... 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   Cardiac pacemaker in situ 75/64/3329   Acute diastolic (congestive) heart failure ([place]) 51/88/4166   Acute diastolic heart failure ([place]) 10/11/2017   CHB (complete heart block) (HCC)    SOB (shortness of breath)    Dyslipidemia, goal LDL below 70    Risk for falls 12/21/2016   Parent-foster child problem 11/25/2016   Lumbar stenosis with neurogenic claudication 08/04/2016   Lightheadedness 02/03/2016   Right ear pain 03/22/2013   Shingles 03/22/2013   Other specified cardiac arrhythmias 02/19/2013   Old myocardial infarction 02/19/2013   History of total hip replacement 02/19/2013   History of TIA (transient ischemic attack) 02/19/2013   Type II diabetes mellitus ([place]) 02/19/2013   Generalized ischemic cerebrovascular disease 02/19/2013   Heart disease 02/19/2013   Status post aorto-coronary artery bypass graft 02/19/2013   Anemia 02/18/2013   B12 deficiency 02/18/2013   Kidney stones 02/18/2013   GERD (gastroesophageal reflux disease) 02/18/2013   Esophageal stricture 02/18/2013   Stroke ([place]) 02/18/2013   DJD (degenerative joint disease) 02/18/2013   Hyperlipidemia 02/18/2013   Arthritis 01/10/2013   Angina pectoris ([place]) 12/14/2010  Malaise and fatigue 10/19/2010   COUGH 08/07/2010   ANEMIA, SECONDARY TO ACUTE BLOOD LOSS 03/03/2010   GI BLEED 03/03/2010   Osteoarthritis 03/03/2010   Gastrointestinal hemorrhage, unspecified 03/03/2010   Atherosclerotic heart disease of native coronary artery without angina pectoris 02/23/2010   DIZZINESS 12/04/2009   Essential hypertension 02/05/2009   Hx of CABG-2008 02/05/2009   History of coronary artery bypass graft 02/05/2009   H/O acute myocardial infarction 08/23/2006    Vanessa Cuthbert, PT,  DPT 02/13/21 1:18 PM   [place] [facility] [address] [place], [place], [address] Phone: [phone]   Fax:  [phone]  Name: Blake Burgess MRN: [id number] Date of Birth: April 08, 1932

## 2021-02-16 ENCOUNTER — Encounter (HOSPITAL_COMMUNITY): Payer: Self-pay | Admitting: Dentistry

## 2021-02-16 ENCOUNTER — Other Ambulatory Visit: Payer: Self-pay

## 2021-02-16 ENCOUNTER — Ambulatory Visit (INDEPENDENT_AMBULATORY_CARE_PROVIDER_SITE_OTHER): Payer: Dental | Admitting: Dentistry

## 2021-02-16 DIAGNOSIS — K08199 Complete loss of teeth due to other specified cause, unspecified class: Secondary | ICD-10-CM

## 2021-02-16 NOTE — Progress Notes (Signed)
Department of Dental Medicine      POSTOPERATIVE VISIT  Service Date:   02/16/2021  Patient Name:   LADARIOUS KRESSE Date of Birth:   [birth year] Medical Record Number: [id number]        TODAY'S VISIT:   Assessment:   The patient is healing well and consistent with dental procedures performed.   Plan:  Follow-up as needed.  The patient is cleared for cardiac surgery from a dental perspective at this time. Recommendations: Establish care at an outside dental office for routine treatment including replacement of missing teeth as needed, cleanings/periodontal therapy and exams.  Discussed in detail all treatment options and recommendations with the patient and they are agreeable to the plan.       PROGRESS NOTE:   COVID-19 SCREENING:  The patient denies symptoms concerning for COVID-19 infection including fever, chills, cough, or newly developed shortness of breath.   HISTORY OF PRESENT ILLNESS AUDLEY HINOJOS presents today with his son for a postoperative visit s/p multiple extractions in the operating room on 6/14.   Medical and dental history reviewed with the patient.  No changes reported.   CHIEF COMPLAINT:   Patient with no complaints.  He reports that he did have some pain for a while after dental extractions, but is feeling much better now.   Patient Active Problem List   Diagnosis Date Noted   [place]    Chronic apical periodontitis    Retained dental root    Chronic periodontitis    Persistent atrial fibrillation ([place]) 11/19/2020   Precordial chest pain 11/19/2020   Pacemaker 11/06/2019   Educated about COVID-19 virus infection 12/12/2018   Nonrheumatic aortic valve stenosis 12/12/2018   Actinic skin damage 10/13/2018   Back pain 10/13/2018   Cancer ([place]) 10/13/2018   Diverticulosis 10/13/2018   Spinal stenosis 10/13/2018   Hearing deficit 10/13/2018   Complete atrioventricular block ([place]) 10/13/2018   Dyspnea 10/13/2018   S/P shoulder replacement, right  08/25/2018   History of artificial joint 08/25/2018   Preop cardiovascular exam 07/13/2018   Coronary artery disease involving native coronary artery of native heart without angina pectoris 06/05/2018   Chronic combined systolic and diastolic heart failure ([place]) 06/05/2018   Dyslipidemia 06/05/2018   Pain in joint of right shoulder 05/09/2018   Osteoarthritis of right glenohumeral joint 05/09/2018   ................................................................................... 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   Cardiac pacemaker in situ 41/96/2229   Acute diastolic (congestive) heart failure ([place]) 79/89/2119   Acute diastolic heart failure ([place]) 10/11/2017   CHB (complete heart block) (HCC)    SOB (shortness of breath)    Dyslipidemia, goal LDL below 70    Risk for falls 12/21/2016   Parent-foster child problem 11/25/2016   Lumbar stenosis with neurogenic claudication 08/04/2016   Lightheadedness 02/03/2016   Right ear pain 03/22/2013   Shingles 03/22/2013   Other specified cardiac arrhythmias 02/19/2013   Old myocardial infarction 02/19/2013   History of total hip replacement 02/19/2013   History of TIA (transient ischemic attack) 02/19/2013   Type II diabetes mellitus ([place]) 02/19/2013   Generalized ischemic cerebrovascular disease 02/19/2013   Heart disease 02/19/2013   Status post aorto-coronary artery bypass graft 02/19/2013   Anemia 02/18/2013   B12 deficiency 02/18/2013   Kidney stones 02/18/2013   GERD (gastroesophageal reflux disease) 02/18/2013   Esophageal stricture 02/18/2013   Stroke ([place]) 02/18/2013   DJD (degenerative joint disease) 02/18/2013   Hyperlipidemia 02/18/2013  Arthritis 01/10/2013   Angina pectoris ([place]) 12/14/2010   Malaise and fatigue 10/19/2010   COUGH 08/07/2010   ANEMIA, SECONDARY TO ACUTE BLOOD LOSS 03/03/2010   GI BLEED 03/03/2010   Osteoarthritis 03/03/2010    Gastrointestinal hemorrhage, unspecified 03/03/2010   Atherosclerotic heart disease of native coronary artery without angina pectoris 02/23/2010   DIZZINESS 12/04/2009   Essential hypertension 02/05/2009   Hx of CABG-2008 02/05/2009   History of coronary artery bypass graft 02/05/2009   H/O acute myocardial infarction 08/23/2006   Past Medical History:  Diagnosis Date   Aortic stenosis    mild AS 09/2017 echo   Cancer [facility])    skin   Coronary artery disease    a.  s/p CABG;   b. cath 4/12: EF 55%, 3vCAD, patent L-LAD, patent S-RCA, patent S-CFX (done after a false pos. ETT)   Diverticular disease    GERD (gastroesophageal reflux disease)    GI bleed    Hemorrhoids    HH (hiatus hernia)    History of kidney stones    Hypertension    Osteoarthritis    Other and unspecified hyperlipidemia    Presence of permanent cardiac pacemaker    Schatzki's ring    Stroke [facility])    Past Surgical History:  Procedure Laterality Date   ARTERIOVENOUS GRAFT PLACEMENT W/ ENDOSCOPIC VEIN HARVEST     of the right leg greater spahenous vein. Surgeon: Tharon Aquas Trigt,M.D.   COLONOSCOPY  02/24/2010   Hemorrhoids, Diverticulosis. Performed at [place]. Normal terminal ileum. Dr. June Leap, [place] ARTERY BYPASS GRAFT  06/21/2007   CABG x 3 Surgeon Ivin Poot, MD   EYE SURGERY     bilateral cataract removal   hip replace  06/09/2004   left hip Surgeon Pietro Cassis. Alvan Dame, MD   LEFT HEART CATH AND CORS/GRAFTS ANGIOGRAPHY N/A 11/04/2020   Procedure: LEFT HEART CATH AND CORS/GRAFTS ANGIOGRAPHY;  Surgeon: Troy Sine, MD;  Location: [place] [facility];  Service: Cardiovascular;  Laterality: N/A;   MULTIPLE EXTRACTIONS WITH ALVEOLOPLASTY N/A 02/03/2021   Procedure: MULTIPLE EXTRACTION WITH ALVEOLOPLASTY;  Surgeon: Charlaine Dalton, DMD;  Location: [place];  Service: Dentistry;  Laterality: N/A;   PACEMAKER IMPLANT N/A 10/03/2017   Procedure: PACEMAKER IMPLANT;  Surgeon: Evans Lance, MD;   Location: [place] [facility];  Service: Cardiovascular;  Laterality: N/A;   REVERSE SHOULDER ARTHROPLASTY Right 08/25/2018   Procedure: REVERSE SHOULDER ARTHROPLASTY;  Surgeon: Netta Cedars, MD;  Location: [place];  Service: Orthopedics;  Laterality: Right;   Current Outpatient Medications  Medication Sig Dispense Refill   acetaminophen (TYLENOL) 500 MG tablet Take 1,000 mg by mouth every 6 (six) hours as needed for moderate pain.     albuterol (PROVENTIL) (2.5 MG/3ML) 0.083% nebulizer solution Take 3 mLs (2.5 mg total) by nebulization every 6 (six) hours as needed for wheezing or shortness of breath. 75 mL 12   amLODipine (NORVASC) 2.5 MG tablet Take 2.5 mg by mouth in the morning.     apixaban (ELIQUIS) 5 MG TABS tablet Take 1 tablet (5 mg total) by mouth 2 (two) times daily. 60 tablet 3   atorvastatin (LIPITOR) 40 MG tablet Take 40 mg by mouth in the morning.     cyanocobalamin (,VITAMIN B-12,) 1000 MCG/ML injection Inject 1,000 mcg into the muscle every 30 (thirty) days.     donepezil (ARICEPT) 10 MG tablet Take 10 mg by mouth at bedtime.     enalapril (VASOTEC) 20 MG tablet  Take 20 mg by mouth 2 (two) times daily.     Fluticasone-Umeclidin-Vilant (TRELEGY ELLIPTA) 100-62.5-25 MCG/INH AEPB Inhale 1 puff into the lungs daily. Rinse mouth after 60 each 1   furosemide (LASIX) 40 MG tablet Take 40 mg by mouth in the morning.     isosorbide mononitrate (IMDUR) 60 MG 24 hr tablet Take 1 tablet (60 mg total) by mouth daily. (Patient taking differently: Take 60 mg by mouth in the morning.) 90 tablet 3   Lidocaine 4 % PTCH Apply 1 patch topically daily as needed (pain).     metoprolol succinate (TOPROL-XL) 50 MG 24 hr tablet Take 1 tablet (50 mg total) by mouth daily. (Patient taking differently: Take 50 mg by mouth in the morning.) 90 tablet 3   nitroGLYCERIN (NITROSTAT) 0.4 MG SL tablet Place 1 tablet (0.4 mg total) under the tongue every 5 (five) minutes as needed for chest pain. (Patient taking  differently: Place 0.4 mg under the tongue every 5 (five) minutes x 3 doses as needed for chest pain.) 25 tablet 3   omeprazole (PRILOSEC) 20 MG capsule Take 20 mg by mouth in the morning.     tamsulosin (FLOMAX) 0.4 MG CAPS capsule Take 0.4 mg by mouth in the morning.     No current facility-administered medications for this visit.   No Known Allergies   LABS: Lab Results  Component Value Date   WBC 5.4 11/12/2020   HGB 10.9 (L) 11/12/2020   HCT 34.5 (L) 11/12/2020   MCV 97.5 11/12/2020   PLT 291 11/12/2020   BMET    Component Value Date/Time   NA 138 02/03/2021 1030   NA 137 01/12/2021 1216   K 3.7 02/03/2021 1030   CL 109 02/03/2021 1030   CO2 23 02/03/2021 1030   GLUCOSE 118 (H) 02/03/2021 1030   BUN 31 (H) 02/03/2021 1030   BUN 24 01/12/2021 1216   CREATININE 1.23 02/03/2021 1030   CALCIUM 8.4 (L) 02/03/2021 1030   GFRNONAA 57 (L) 02/03/2021 1030   GFRAA >60 08/26/2018 0726    Lab Results  Component Value Date   INR 1.18 10/01/2017   INR 1.02 06/03/2013   INR 1.05 02/04/2012   No results found for: PTT   VITALS: BP 111/65 (BP Location: Right Arm, Patient Position: Sitting, Cuff Size: Normal)   Pulse 60   Temp 98.1 F (36.7 C) (Oral)    EXAM: Extraction sites appear to be healing WNL.  No signs of wound dehiscence or infection evident upon examination.  No sutures remain in-tact. Broken amalgam restoration on tooth #5 since extractions in the OR.  The patient denies pain or sensitivity and there are no signs of infection on radiographs or clinically; some recurrent decay evident below where amalgam restoration was previously.   ASSESSMENT:   Postoperative course is consistent with dental procedures performed.   PROCEDURES: The patient was given a chlorhexidine gluconate rinse for 30 seconds.  Irrigation with sterile saline in extraction sites.   PLAN AND RECOMMENDATIONS: Follow-up as needed.   Monitor tooth #5 with missing filling for new  symptoms of pain or sensitivity.  Primary dentist will develop definitive options for tooth (extraction vs restorative), but there is no need for urgent treatment prior to heart surgery right now.  Establish care at an outside dental office for routine dental care including replacement of missing teeth as needed, cleanings/periodontal therapy and exams.   Recommend that the patient discuss plans to return to the dentist for non-urgent treatment  with their medical team to ensure they are medically optimized and there are no contraindications. Call if any questions or concerns arise.  All questions and concerns were invited and addressed.  The patient tolerated today's visit well and departed in stable condition.  Jenner Benson [place], D.M.D.

## 2021-02-17 ENCOUNTER — Institutional Professional Consult (permissible substitution): Payer: Medicare HMO | Admitting: Thoracic Surgery (Cardiothoracic Vascular Surgery)

## 2021-02-17 ENCOUNTER — Encounter: Payer: Self-pay | Admitting: Thoracic Surgery (Cardiothoracic Vascular Surgery)

## 2021-02-17 VITALS — BP 108/56 | HR 50 | Resp 20 | Ht 69.0 in | Wt 165.0 lb

## 2021-02-17 DIAGNOSIS — I350 Nonrheumatic aortic (valve) stenosis: Secondary | ICD-10-CM | POA: Diagnosis not present

## 2021-02-17 NOTE — Progress Notes (Addendum)
[address]       [place] [address]             [phone]     CARDIOTHORACIC SURGERY CONSULTATION REPORT  Primary Cardiologist is Minus Breeding, MD PCP is Raelene Bott, MD  Chief Complaint  Patient presents with   Aortic Stenosis    Surgical consult for TAVR, review all testing    HPI:  Patient is an 87 year old male with history of multivessel coronary artery disease status post coronary artery bypass grafting in the remote past, aortic stenosis, hypertension, COPD, paroxysmal atrial fibrillation on long-term anticoagulation using Eliquis, complete heart block status post permanent pacemaker placement in 2019, ischemic cardiomyopathy with chronic systolic congestive heart failure, previous stroke, GE reflux disease, and hiatal hernia who has been referred for surgical consultation to discuss treatment options for management of aortic stenosis.  Patient's cardiac history dates back to 2008 when he underwent coronary artery bypass grafting by Dr. Darcey Nora.  He has been followed for many years by Dr. Percival Spanish.  Patient has chronic exertional shortness of breath that has progressed.  He now gets short of breath with very low level activity.  Previous echocardiograms have documented the presence of moderately decreased left ventricular systolic function with moderate aortic stenosis.  The patient underwent diagnostic cardiac catheterization in March 2022 which revealed severe native coronary artery disease but continued patency of all bypass grafts involving all 3 vascular territories.  Follow-up transthoracic echocardiogram performed December 10, 2020 revealed some progression in the severity of the patient's aortic stenosis.  Left ventricular ejection fraction was estimated 45 to 50%.  The aortic valve was trileaflet with severe thickening and restricted leaflet mobility involving all 3 leaflets.  Peak velocity across aortic valve measured 3.2 m/s corresponding to  mean transvalvular gradient estimated 21.9 mmHg and aortic valve area calculated 1.09 cm by VTI.  The DVI was notably 0.24 and stroke-volume index 47.  The patient has been seen in consultation previously by Dr. Angelena Form.  CT angiography was performed and the patient was referred for surgical consultation. He was evaluated recently by Dr. 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  The patient has never been a smoker.  Patient is married and lives with his wife on a farm in [place].  He is accompanied by his son who is very supportive and has participated in all of his health care appointments.  The patient has been slowing down physically for several years.  He complains that he is primarily limited by severe shortness of breath.  He gets short of breath with very low level activity and occasionally at rest.  He also has some chest pain that occurs off and on both with and without exertion.  Episodes of chest pain are not necessarily related with severe shortness of breath.  He has had some dizzy spells without syncope.  He denies PND, orthopnea, or lower extremity edema.  He also has degenerative arthritis but his primary limitation is that of severe shortness of breath.  He denies any productive cough or hemoptysis.   Past Medical History:  Diagnosis Date   Aortic stenosis    mild AS 09/2017 echo   Cancer [facility])    skin   Coronary artery disease    a.  s/p CABG;   b. cath 4/12: EF 55%, 3vCAD, patent L-LAD, patent S-RCA, patent S-CFX (done after a false  pos. ETT)   Diverticular disease    GERD (gastroesophageal reflux disease)    GI bleed    Hemorrhoids    HH (hiatus hernia)    History of kidney stones    Hypertension    Osteoarthritis    Other and unspecified hyperlipidemia    Presence of permanent cardiac pacemaker    Schatzki's ring    Stroke [facility])     Past Surgical History:  Procedure  Laterality Date   ARTERIOVENOUS GRAFT PLACEMENT W/ ENDOSCOPIC VEIN HARVEST     of the right leg greater spahenous vein. Surgeon: Tharon Aquas Trigt,M.D.   COLONOSCOPY  02/24/2010   Hemorrhoids, Diverticulosis. Performed at [place]. Normal terminal ileum. Dr. June Leap, [place] ARTERY BYPASS GRAFT  06/21/2007   CABG x 3 Surgeon Ivin Poot, MD   EYE SURGERY     bilateral cataract removal   hip replace  06/09/2004   left hip Surgeon Pietro Cassis. Alvan Dame, MD   LEFT HEART CATH AND CORS/GRAFTS ANGIOGRAPHY N/A 11/04/2020   Procedure: LEFT HEART CATH AND CORS/GRAFTS ANGIOGRAPHY;  Surgeon: Troy Sine, MD;  Location: [place] [facility];  Service: Cardiovascular;  Laterality: N/A;   MULTIPLE EXTRACTIONS WITH ALVEOLOPLASTY N/A 02/03/2021   Procedure: MULTIPLE EXTRACTION WITH ALVEOLOPLASTY;  Surgeon: Charlaine Dalton, DMD;  Location: [place];  Service: Dentistry;  Laterality: N/A;   PACEMAKER IMPLANT N/A 10/03/2017   Procedure: PACEMAKER IMPLANT;  Surgeon: Evans Lance, MD;  Location: [place] [facility];  Service: Cardiovascular;  Laterality: N/A;   REVERSE SHOULDER ARTHROPLASTY Right 08/25/2018   Procedure: REVERSE SHOULDER ARTHROPLASTY;  Surgeon: Netta Cedars, MD;  Location: [place];  Service: Orthopedics;  Laterality: Right;    Family History  Problem Relation Age of Onset   Heart attack Mother    Hypertension Mother    Diabetes Father    Diabetes Brother    Diabetes Sister     Social History   Socioeconomic History   Marital status: Married    Spouse name: Not on file   Number of children: 2   Years of education: Not on file   Highest education level: Not on file  Occupational History   Occupation: Reitred-Farmer    Employer: RETIRED  Tobacco Use   Smoking status: Never   Smokeless tobacco: Never  Vaping Use   Vaping Use: Never used  Substance and Sexual Activity   Alcohol use: No   Drug use: No   Sexual activity: Not on file  Other Topics Concern   Not on file   Social History Narrative   No Regular exercise. Daily Caffeine: 24 oz pepsi and 1 cup coffee.    Social Determinants of Health   Financial Resource Strain: Not on file  Food Insecurity: Not on file  Transportation Needs: Not on file  Physical Activity: Not on file  Stress: Not on file  Social Connections: Not on file  Intimate Partner Violence: Not on file    Current Outpatient Medications  Medication Sig Dispense Refill   acetaminophen (TYLENOL) 500 MG tablet Take 1,000 mg by mouth every 6 (six) hours as needed for moderate pain.     albuterol (PROVENTIL) (2.5 MG/3ML) 0.083% nebulizer solution Take 3 mLs (2.5 mg total) by nebulization every 6 (six) hours as needed for wheezing or shortness of breath. 75 mL 12   amLODipine (NORVASC) 2.5 MG tablet Take 2.5 mg by mouth in the morning.     apixaban (ELIQUIS) 5 MG TABS tablet Take  1 tablet (5 mg total) by mouth 2 (two) times daily. 60 tablet 3   atorvastatin (LIPITOR) 40 MG tablet Take 40 mg by mouth in the morning.     cyanocobalamin (,VITAMIN B-12,) 1000 MCG/ML injection Inject 1,000 mcg into the muscle every 30 (thirty) days.     donepezil (ARICEPT) 10 MG tablet Take 10 mg by mouth at bedtime.     enalapril (VASOTEC) 20 MG tablet Take 20 mg by mouth 2 (two) times daily.     Fluticasone-Umeclidin-Vilant (TRELEGY ELLIPTA) 100-62.5-25 MCG/INH AEPB Inhale 1 puff into the lungs daily. Rinse mouth after 60 each 1   furosemide (LASIX) 40 MG tablet Take 40 mg by mouth in the morning.     isosorbide mononitrate (IMDUR) 60 MG 24 hr tablet Take 1 tablet (60 mg total) by mouth daily. (Patient taking differently: Take 60 mg by mouth in the morning.) 90 tablet 3   Lidocaine 4 % PTCH Apply 1 patch topically daily as needed (pain).     metoprolol succinate (TOPROL-XL) 50 MG 24 hr tablet Take 1 tablet (50 mg total) by mouth daily. (Patient taking differently: Take 50 mg by mouth in the morning.) 90 tablet 3   nitroGLYCERIN (NITROSTAT) 0.4 MG SL tablet  Place 1 tablet (0.4 mg total) under the tongue every 5 (five) minutes as needed for chest pain. (Patient taking differently: Place 0.4 mg under the tongue every 5 (five) minutes x 3 doses as needed for chest pain.) 25 tablet 3   omeprazole (PRILOSEC) 20 MG capsule Take 20 mg by mouth in the morning.     tamsulosin (FLOMAX) 0.4 MG CAPS capsule Take 0.4 mg by mouth in the morning.     No current facility-administered medications for this visit.    No Known Allergies    Review of Systems:   General:  normal appetite, decreased energy, no weight gain, no weight loss, no fever  Cardiac:  + chest pain with exertion, + chest pain at rest, +SOB with exertion, + resting SOB, no PND, no orthopnea, no palpitations, no arrhythmia, + atrial fibrillation, no LE edema, + dizzy spells, no syncope  Respiratory:  + shortness of breath, no home oxygen, no productive cough, intermittent dry cough, no bronchitis, no wheezing, no hemoptysis, no asthma, no pain with inspiration or cough, no sleep apnea, no CPAP at night  GI:   no difficulty swallowing, no reflux, no frequent heartburn, no hiatal hernia, no abdominal pain, no constipation, no diarrhea, no hematochezia, no hematemesis, no melena  GU:   no dysuria,  no frequency, no urinary tract infection, no hematuria, no enlarged prostate, no kidney stones, no kidney disease  Vascular:  no pain suggestive of claudication, no pain in feet, no leg cramps, no varicose veins, no DVT, no non-healing foot ulcer  Neuro:   no stroke, no TIA's, no seizures, no headaches, no temporary blindness one eye,  no slurred speech, no peripheral neuropathy, no chronic pain, mild instability of gait, no memory/cognitive dysfunction  Musculoskeletal: + arthritis, no joint swelling, no myalgias, some difficulty walking, somewhat reduced mobility   Skin:   no rash, no itching, no skin infections, no pressure sores or ulcerations  Psych:   no anxiety, no depression, no nervousness, no  unusual recent stress  Eyes:   no blurry vision, no floaters, no recent vision changes, + wears glasses or contacts  ENT:   + hearing loss, no loose or painful teeth, recently had dental extraction  Hematologic:  + easy bruising,  no abnormal bleeding, no clotting disorder, no frequent epistaxis  Endocrine:  no diabetes, does not check CBG's at home     Physical Exam:   Ht 5\' 9"  (1.753 m)   Wt 165 lb (74.8 kg)   BMI 24.37 kg/m   General:  Thin, elderly and frail-appearing  HEENT:  Unremarkable   Neck:   no JVD, no bruits, no adenopathy   Chest:   clear to auscultation, symmetrical breath sounds, no wheezes, no rhonchi   CV:   RRR, grade II-III/VI systolic murmur   Abdomen:  soft, non-tender, no masses   Extremities:  warm, well-perfused, pulses palpable, no LE edema  Rectal/GU  Deferred  Neuro:   Grossly non-focal and symmetrical throughout  Skin:   Clean and dry, no rashes, no breakdown   Diagnostic Tests:  ECHOCARDIOGRAM REPORT         Patient Name:   WILMOT QUEVEDO Date of Exam: 12/10/2020  Medical Rec #:  [id number]     Height:       69.0 in  Accession #:    [id number]    Weight:       170.2 lb  Date of Birth:  [birth year]     BSA:          1.929 m  Patient Age:    29 years      BP:           122/56 mmHg  Patient Gender: M             HR:           63 bpm.  Exam Location:  [address]   Procedure: 2D Echo, 3D Echo, Cardiac Doppler and Color Doppler   Indications:    I48.0 Atrial Fibrillation     History:        Patient has prior history of Echocardiogram examinations,  most                  recent 10/01/2017. CHF, CAD, Prior CABG and Pacemaker,  Stroke,                  Aortic Valve Disease, Arrythmias:Atrial Fibrillation,                  Signs/Symptoms:Chest Pain and Shortness of Breath; Risk                  Factors:Hypertension, Family History of Coronary Artery  Disease                  and Dyslipidemia. Edema, Aortic Stenosis.     Sonographer:    [place]  Referring Phys: [id number] [place]     1. Left ventricular ejection fraction, by estimation, is 45 to 50%. The  left ventricle has mildly decreased function. The left ventricle  demonstrates regional wall motion abnormalities (see scoring  diagram/findings for description). Left ventricular  diastolic parameters are consistent with Grade II diastolic dysfunction  (pseudonormalization). Elevated left atrial pressure. There is moderate  hypokinesis of the left ventricular, basal inferior wall and inferolateral  wall.   2. Right ventricular systolic function is normal. The right ventricular  size is normal. There is normal pulmonary artery systolic pressure. The  estimated right ventricular systolic pressure is 65.7 mmHg.   3. Left atrial size was moderately dilated.   4. Right atrial size was moderately dilated.   5. The mitral valve is normal in structure.  Mild mitral valve  regurgitation.   6. The aortic valve is tricuspid. There is severe calcifcation of the  aortic valve. There is severe thickening of the aortic valve. Aortic valve  regurgitation is not visualized. Moderate to severe aortic valve stenosis.   7. The inferior vena cava is normal in size with greater than 50%  respiratory variability, suggesting right atrial pressure of 3 mmHg.   Comparison(s): A prior study was performed on 10/01/2017. Prior images  reviewed side by side. The left ventricular function is worsened. The left  ventricular diastolic function is significantly worse. The left  ventricular wall motion abnormality is  worse. Aortic stenosis has worsened.   FINDINGS   Left Ventricle: Left ventricular ejection fraction, by estimation, is 45  to 50%. The left ventricle has mildly decreased function. The left  ventricle demonstrates regional wall motion abnormalities. Moderate  hypokinesis of the left ventricular, basal  inferior wall and inferolateral wall. 3D left  ventricular ejection  fraction analysis performed but not reported based on interpreter  judgement due to suboptimal quality. The left ventricular internal cavity  size was normal in size. There is no left  ventricular hypertrophy. Left ventricular diastolic parameters are  consistent with Grade II diastolic dysfunction (pseudonormalization).  Elevated left atrial pressure.   Right Ventricle: The right ventricular size is normal. No increase in  right ventricular wall thickness. Right ventricular systolic function is  normal. There is normal pulmonary artery systolic pressure. The tricuspid  regurgitant velocity is 2.64 m/s, and   with an assumed right atrial pressure of 3 mmHg, the estimated right  ventricular systolic pressure is 01.0 mmHg.   Left Atrium: Left atrial size was moderately dilated.   Right Atrium: Right atrial size was moderately dilated.   Pericardium: There is no evidence of pericardial effusion.   Mitral Valve: The mitral valve is normal in structure. Mild mitral annular  calcification. Mild mitral valve regurgitation, with centrally-directed  jet. MV peak gradient, 7.4 mmHg. The mean mitral valve gradient is 2.0  mmHg.   Tricuspid Valve: The tricuspid valve is normal in structure. Tricuspid  valve regurgitation is trivial.   Aortic Valve: The aortic valve is tricuspid. There is severe calcifcation  of the aortic valve. There is severe thickening of the aortic valve.  Aortic valve regurgitation is not visualized. Moderate to severe aortic  stenosis is present. Aortic valve mean  gradient measures 21.9 mmHg. Aortic valve peak gradient measures 41.5  mmHg. Aortic valve area, by VTI measures 1.09 cm.   Pulmonic Valve: The pulmonic valve was grossly normal. Pulmonic valve  regurgitation is not visualized.   Aorta: The aortic root and ascending aorta are structurally normal, with  no evidence of dilitation.   Venous: The inferior vena cava is normal in size  with greater than 50%  respiratory variability, suggesting right atrial pressure of 3 mmHg.   IAS/Shunts: No atrial level shunt detected by color flow Doppler.   Additional Comments: A device lead is visualized in the right ventricle  and right atrium.      LEFT VENTRICLE  PLAX 2D  LVIDd:         5.30 cm  Diastology  LVIDs:         4.30 cm  LV e' medial:    5.55 cm/s  LV PW:         0.80 cm  LV E/e' medial:  21.0  LV IVS:        0.70 cm  LV e'  lateral:   7.18 cm/s  LVOT diam:     2.40 cm  LV E/e' lateral: 16.2  LV SV:         91  LV SV Index:   47  LVOT Area:     4.52 cm                             3D Volume EF:                          3D EF:        57 %                          LV EDV:       198 ml                          LV ESV:       85 ml                          LV SV:        113 ml   RIGHT VENTRICLE  RV S prime:     12.10 cm/s  TAPSE (M-mode): 1.8 cm   LEFT ATRIUM              Index       RIGHT ATRIUM           Index  LA diam:        4.20 cm  2.18 cm/m  RA Area:     23.00 cm  LA Vol (A2C):   107.0 ml 55.47 ml/m RA Volume:   68.60 ml  35.56 ml/m  LA Vol (A4C):   71.7 ml  37.17 ml/m  LA Biplane Vol: 95.7 ml  49.61 ml/m   AORTIC VALVE  AV Area (Vmax):    1.04 cm  AV Area (Vmean):   0.98 cm  AV Area (VTI):     1.09 cm  AV Vmax:           322.15 cm/s  AV Vmean:          236.827 cm/s  AV VTI:            0.835 m  AV Peak Grad:      41.5 mmHg  AV Mean Grad:      21.9 mmHg  LVOT Vmax:         73.81 cm/s  LVOT Vmean:        51.433 cm/s  LVOT VTI:          0.202 m  LVOT/AV VTI ratio: 0.24     AORTA  Ao Root diam: 3.80 cm  Ao Asc diam:  3.50 cm   MITRAL VALVE                TRICUSPID VALVE  MV Area (PHT): cm          TR Peak grad:   27.9 mmHg  MV Peak grad:  7.4 mmHg     TR Vmax:        264.00 cm/s  MV Mean grad:  2.0 mmHg  MV Vmax:       1.36 m/s     SHUNTS  MV Vmean:      65.0 cm/s    Systemic  VTI:  0.20 m  MV Decel Time: 414 msec     Systemic Diam:  2.40 cm  MV E velocity: 116.50 cm/s  MV A velocity: 111.00 cm/s  MV E/A ratio:  1.05   Mihai Croitoru MD  Electronically signed by Sanda Klein MD  Signature Date/Time: 12/10/2020/5:05:26 PM      LEFT HEART CATH AND CORS/GRAFTS ANGIOGRAPHY    Conclusion    Mid LM to Prox LAD lesion is 95% stenosed. Mid LAD lesion is 100% stenosed. Dist LAD lesion is 25% stenosed. Ost Cx to Prox Cx lesion is 80% stenosed. Mid Cx lesion is 90% stenosed. 2nd Mrg lesion is 50% stenosed. Prox RCA lesion is 95% stenosed. Mid RCA lesion is 100% stenosed.   Severe native coronary calcification and multivessel CAD without left main and a common ostium giving  rise to a severely calcified proximal LAD with 95% stenosis and total occlusion of the LAD after a prominent septal perforating artery; and 80% ostial calcified circumflex stenosis followed by 90% proximal to mid circumflex stenosis.  There is competitive filling of the distal circumflex via the SVG supplying the OM 3 vessel.   Total mid occlusion of the native RCA.   Patent LIMA graft supplying the mid LAD with the distal LAD being a small caliber vessel.   Patent SVG supplying the OM 3 vessel of the circumflex coronary artery.   Patent SVG supplying the distal RCA.   Mild aortic valve stenosis with a 17 mm peak gradient on LV to AO pullback.   RECOMMENDATION: Increase medical therapy with possible further titration of amlodipine and beta-blocker therapy.  Recommend a follow-up 2D echo Doppler study.  The patient will follow up with Dr. Percival Spanish.    Recommendations  Antiplatelet/Anticoag Recommend Aspirin 81mg  daily for moderate CAD.     Indications  Coronary artery disease involving native coronary artery of native heart with angina pectoris ([place]) [I25.119 (ICD-10-CM)]    Procedural Details  Technical Details Mr. Arthuro Canelo is an 87 year old patient of Dr. Percival Spanish.  He underwent CABG revascularization surgery in 2008 by Dr.  Darcey Nora and had a LIMA placed to his LAD, and SVG to his OM 3 vessel and SVG to his RCA.  Catheterization in 2012 showed severe disease of the ostial/proximal LAD and circumflex vessel with patent grafts.  He is status post permanent pacemaker placement.  He has mild aortic stenosis noted at his last echo Doppler study in 2019.  He had recently developed episodic chest pain and was hospitalized at [facility].  He is referred by Dr. Percival Spanish for definitive cardiac catheterization.  Patient arrived to the catheterization laboratory in the fasting state.  He was hypertensive on arrival.  He was premedicated with Versed 1 mg and fentanyl 25 mcg.  Ultrasound guidance was utilized.  His right femoral artery was punctured anteriorly and a 5 French sheath was inserted without difficulty.  Diagnostic catheterization was done with Judkins 4 left and right coronary catheters.  The right catheter was used for selective angiography into both vein grafts as well as the left internal mammary artery.  The RCA catheter was advanced into the left ventricle for left ventricular pressure recording and hand-injection left ventriculography.  LV to AO pullback was performed.  Hemostasis was obtained by direct manual pressure.  The patient tolerated the procedure well. Estimated blood loss <50 mL.   During this procedure medications were administered to achieve and maintain moderate conscious sedation while the patient's heart rate, blood pressure, and oxygen  saturation were continuously monitored and I was present face-to-face 100% of this time.    Medications (Filter: Administrations occurring from 1025 to 1147 on 11/04/20)  Heparin (Porcine) in NaCl 1000-0.9 UT/500ML-% SOLN (mL) Total volume:  1,000 mL  Date/Time Rate/Dose/Volume Action   11/04/20 1038 500 mL Given   1038 500 mL Given    midazolam (VERSED) injection (mg) Total dose:  1 mg  Date/Time Rate/Dose/Volume Action   11/04/20 1049 1 mg Given     fentaNYL (SUBLIMAZE) injection (mcg) Total dose:  25 mcg  Date/Time Rate/Dose/Volume Action   11/04/20 1049 25 mcg Given    lidocaine (PF) (XYLOCAINE) 1 % injection (mL) Total volume:  13 mL  Date/Time Rate/Dose/Volume Action   11/04/20 1105 13 mL Given    iohexol (OMNIPAQUE) 350 MG/ML injection (mL) Total volume:  115 mL  Date/Time Rate/Dose/Volume Action   11/04/20 1140 115 mL Given     Sedation Time  Sedation Time Physician-1: 46 minutes 33 seconds   Contrast  Medication Name Total Dose  iohexol (OMNIPAQUE) 350 MG/ML injection 115 mL    Radiation/Fluoro  Fluoro time: 9.5 (min) DAP: 18.1 (Gycm2) Cumulative Air Kerma: 300.4 (mGy)   Coronary Findings   Diagnostic Dominance: Right  Left Main  Mid LM to Prox LAD lesion is 95% stenosed.  Left Anterior Descending  Mid LAD lesion is 100% stenosed.  Dist LAD lesion is 25% stenosed.  First Diagonal Branch  Vessel is small in size.  Left Circumflex  Ost Cx to Prox Cx lesion is 80% stenosed.  Mid Cx lesion is 90% stenosed.  First Obtuse Marginal Branch  Vessel is small in size.  Second Obtuse Marginal Branch  2nd Mrg lesion is 50% stenosed.  Right Coronary Artery  Prox RCA lesion is 95% stenosed.  Mid RCA lesion is 100% stenosed.  Right Ventricular Branch  Vessel is small in size.  LIMA Graft To Mid LAD  Graft To 3rd Mrg  Graft To Dist RCA   Intervention   No interventions have been documented.         Left Heart  Left Ventricle Mild LV dysfunction with EF estimated 45 to 50% with suggestion of very mild distal inferior hypocontractility.  Pullback pressure:  LV: 147/18 AO: 130/52    Coronary Diagrams   Diagnostic Dominance: Right    Intervention     Implants     No implant documentation for this case.    Syngo Images   Show images for CARDIAC CATHETERIZATION  Images on Long Term Storage   Show images for Mare, [place]tke to Procedure Log  Procedure Log       Hemo Data  Flowsheet Row Most Recent Value  AO Systolic Pressure 062 mmHg  AO Diastolic Pressure 49 mmHg  AO Mean 85 mmHg  LV Systolic Pressure 694 mmHg  LV Diastolic Pressure 1 mmHg  LV EDP 24 mmHg  AOp Systolic Pressure 854 mmHg  AOp Diastolic Pressure 52 mmHg  AOp Mean Pressure 84 mmHg  LVp Systolic Pressure 627 mmHg  LVp Diastolic Pressure 15 mmHg  LVp EDP Pressure 18 mmHg    EKG: NSR w/out significant AV conduction delay (01/27/2021)   Cardiac TAVR CT   TECHNIQUE: The patient was scanned on a Siemens Force 035 slice scanner. A 120 kV retrospective scan was triggered in the descending thoracic aorta at 111 HU's. Gantry rotation speed was 270 msecs and collimation was .9 mm. No beta blockade or nitro were given. The 3D data  set was reconstructed in 5% intervals of the R-R cycle. Systolic and diastolic phases were analyzed on a dedicated work station using MPR, MIP and VRT modes. The patient received 80 cc of contrast.   FINDINGS: Aortic Valve: Tri leaflet AV Calcified with restricted leaflet motion Calcium Score 2164   Aorta: No aneurysm. Normal arch vessels Moderate calcific atherosclerosis   Sinotubular Junction: 27 mm   Ascending Thoracic Aorta: 29 mm   Aortic Arch: 27 mm   Descending Thoracic Aorta: 25 mm   Sinus of Valsalva Measurements:   Non-coronary: 35.9 mm   Right - coronary: 35.1 mm   Left - coronary: 36.7 mm   Coronary Artery Height above Annulus:   Left Main: 14,25 mm above annulus   Right Coronary: 14.24 mm above annulus   Virtual Basal Annulus Measurements:   Maximum/Minimum Diameter: 29.12 mm x 21.94 mm   Perimeter: 84 mm   Area: 530 mm 2   Coronary Arteries:   Grafts: Patent SVG to OM, Patent SVG to PDA, Patent LIMA to LAD   Optimum Fluoroscopic Angle for Delivery: LAO 7 Caudal 12 degrees   IMPRESSION: 1. Calcified Tri leaflet AV with score 2164   2.  Normal aortic root 2.9 cm moderate calcific atherosclerosis    3.  Pacing wires noted in RA/RV   4.  Patient SVG PDA, SVG OM, Patent LIMA to LAD   5. Annular area 530 mm 2 with calcium noted at base of left cusp suitable for a 26 mm Sapien 3 valve   6. Coronary arteries sufficient height above annulus for deployment and patent grafts see above   7. Optimum angiographic angle for deployment LAO 7 Caudal 12 degrees   Jenkins Rouge     Electronically Signed   By: Jenkins Rouge M.D.   On: 02/13/2021 12:50   CT ANGIOGRAPHY CHEST, ABDOMEN AND PELVIS   TECHNIQUE: Multidetector CT imaging through the chest, abdomen and pelvis was performed using the standard protocol during bolus administration of intravenous contrast. Multiplanar reconstructed images and MIPs were obtained and reviewed to evaluate the vascular anatomy.   CONTRAST:  160mL OMNIPAQUE IOHEXOL 350 MG/ML SOLN   COMPARISON:  Chest CTA 07/10/2020.  In.   FINDINGS: CTA CHEST FINDINGS   Cardiovascular: Heart size is mildly enlarged with left atrial dilatation. There is no significant pericardial fluid, thickening or pericardial calcification. There is aortic atherosclerosis, as well as atherosclerosis of the great vessels of the mediastinum and the coronary arteries, including calcified atherosclerotic plaque in the left main, left anterior descending, left circumflex and right coronary arteries. Status post median sternotomy for CABG including LIMA to the LAD. Severe thickening and calcification of the aortic valve. Calcifications of the mitral annulus.   Mediastinum/Lymph Nodes: No pathologically enlarged mediastinal or hilar lymph nodes. Please note that accurate exclusion of hilar adenopathy is limited on noncontrast CT scans. Esophagus is unremarkable in appearance. No axillary lymphadenopathy.   Lungs/Pleura: 2 mm right upper lobe pulmonary nodule (axial image 37 of series 4), stable compared to the prior examination, likely benign. No other larger more suspicious  appearing pulmonary nodules or masses are noted. No acute consolidative airspace disease. No pleural effusions.   Musculoskeletal/Soft Tissues: Median sternotomy wires. There are no aggressive appearing lytic or blastic lesions noted in the visualized portions of the skeleton.   CTA ABDOMEN AND PELVIS FINDINGS   Hepatobiliary: No suspicious cystic or solid hepatic lesions. No intra or extrahepatic biliary ductal dilatation. Status post cholecystectomy.   Pancreas: No  pancreatic mass. No pancreatic ductal dilatation. No pancreatic or peripancreatic fluid collections or inflammatory changes.   Spleen: Unremarkable.   Adrenals/Urinary Tract: Multifocal cortical scarring in the kidneys bilaterally (right greater than left). No suspicious renal lesions. No hydroureteronephrosis. Bilateral adrenal glands are normal in appearance. Urinary bladder is partially obscured by beam hardening artifact from the patient's left hip arthroplasty. Visualized portions of the urinary bladder are unremarkable.   Stomach/Bowel: The appearance of the stomach is normal. No pathologic dilatation of small bowel or colon. Numerous colonic diverticulae are noted, without surrounding inflammatory changes to suggest an acute diverticulitis at this time. Normal appendix.   Vascular/Lymphatic: Aortic atherosclerosis, without evidence of aneurysm or dissection in the abdominal or pelvic vasculature. Vascular findings and measurements pertinent to potential TAVR procedure, as detailed above. No lymphadenopathy noted in the abdomen or pelvis.   Reproductive: Prostate gland and seminal vesicles are unremarkable in appearance. Right-sided hydrocele incompletely imaged.   Other: No significant volume of ascites.  No pneumoperitoneum.   Musculoskeletal: Status post PLIF from L3-L5 with interbody cage at the L4-L5 interspace. There are no aggressive appearing lytic or blastic lesions noted in the visualized  portions of the skeleton. Status post left hip arthroplasty.   VASCULAR MEASUREMENTS PERTINENT TO TAVR:   AORTA:   Minimal Aortic Diameter-13 x 13 mm   Severity of Aortic Calcification-severe   RIGHT PELVIS:   Right Common Iliac Artery -   Minimal Diameter-7.1 x 7.1 mm   Tortuosity-mild   Calcification-moderate to severe   Right External Iliac Artery -   Minimal Diameter-8.4 x 8.6 mm   Tortuosity-mild   Calcification-mild   Right Common Femoral Artery -   Minimal Diameter-6.1 x 5.1 mm   Tortuosity-mild   Calcification-moderate to severe   LEFT PELVIS:   Left Common Iliac Artery -   Minimal Diameter-7.4 x 4.8 mm   Tortuosity-mild   Calcification-moderate to severe   Left External Iliac Artery -   Minimal Diameter-6.9 x 8.7 mm   Tortuosity-mild   Calcification-mild   Left Common Femoral Artery -   Minimal Diameter-1.4 x 1.4 mm   Tortuosity-mild   Calcification-moderate to severe   Review of the MIP images confirms the above findings.   IMPRESSION: 1. Vascular findings and measurements pertinent to potential TAVR procedure, as detailed above. 2. Severe thickening calcification of the aortic valve, compatible with reported clinical history of severe aortic stenosis. 3. Cardiomegaly with left atrial dilatation. 4. Aortic atherosclerosis, in addition to left main and 3 vessel coronary artery disease. Status post median sternotomy for CABG including LIMA to the LAD. 5. 2 mm right upper lobe pulmonary nodule, stable compared to the prior study, nonspecific, but statistically likely benign. 6. Colonic diverticulosis without evidence of acute diverticulitis at this time. 7. Additional incidental findings, as above.     Electronically Signed   By: Vinnie Langton M.D.   On: 02/13/2021 14:09       Impression:  Patient has ischemic cardiomyopathy with chronic systolic congestive heart failure and aortic stenosis which has gradually  progressed in severity and now appears bordering on stage D2 severe low-flow low gradient aortic stenosis.  The patient has longstanding symptoms of shortness of breath that are clearly multifactorial and likely a combination of chronic systolic congestive heart failure and severe COPD.  He also complains of symptoms of chest pain both with and without exertion.  Symptoms of dyspnea are consistent with class III bordering on class IV systolic congestive heart failure.  He gets  short of breath with minimal activity and occasionally at rest.  I have personally reviewed the patient's recent transthoracic echocardiogram, diagnostic cardiac catheterization, EKG, and CT angiograms.  Echocardiogram reveals moderate left ventricular systolic dysfunction with ejection fraction estimated 45 to 50%.  The aortic valve is trileaflet with severe thickening, calcification, and restricted leaflet mobility involving all 3 leaflets.  Peak velocity across aortic valve measured 3.2 m/s corresponding to mean transvalvular gradient estimated 21.9 mmHg and aortic valve area calculated 1.09 cm by VTI.  The DVI was notably 0.24 and stroke-volume index 47.  Diagnostic cardiac catheterization confirmed the presence of aortic stenosis as well as severe native coronary artery disease but continued patency of previous bypass grafts including left internal mammary artery to the distal left anterior descending coronary artery and saphenous vein grafts to the left circumflex and the distal right coronary artery.  Right heart catheterization was not performed.  Most recent EKG reveals sinus rhythm.  Patient has history of paroxysmal atrial fibrillation as well as complete heart block and has a permanent pacemaker in place.  The atrial lead for his pacemaker reportedly is nonfunctional at this time.  Plans for revise the pacemaker lead have been postponed while the patient undergoes TAVR evaluation.  CT angiography confirmed the presence of  findings consistent with severe aortic stenosis.  The patient also appears to have significant aortoiliac disease with borderline adequate access for transfemoral approach.  I agree the patient might benefit to some degree from aortic valve replacement.  I would not consider this elderly gentleman with numerous comorbid medical problems to be a candidate for conventional surgery under any circumstances.   Plan:  The patient and his son were counseled at length regarding treatment alternatives for management of severe symptomatic aortic stenosis. Alternative approaches such as conventional aortic valve replacement, transcatheter aortic valve replacement, and continued medical therapy without intervention were compared and contrasted at length.  The risks associated with conventional surgical aortic valve replacement were discussed in detail, as were expectations for post-operative convalescence, and why I would not consider this patient a candidate for conventional surgery.  Issues specific to transcatheter aortic valve replacement were discussed including questions about long term valve durability, the potential for paravalvular leak, possible increased risk of need for permanent pacemaker placement, and other technical complications related to the procedure itself.  Long-term prognosis with medical therapy was discussed. This discussion was placed in the context of the patient's own specific clinical presentation and past medical history.  All of their questions have been addressed.  The patient desires to proceed with transcatheter aortic valve replacement in the near future.  We tentatively plan for surgery on March 03, 2021.  The patient and his son understand and accept all potential associated risks of transcatheter aortic valve replacement including but not limited to risk of death, stroke, myocardial infarction, arrhythmia, heart block or bradycardia requiring pacemaker revision, bleeding, device  thrombosis or embolization, aortic dissection or other major vascular complication, possible need for conversion to alternative access, paravalvular leak, endocarditis, or late structural valve deterioration and failure.  Following a decision to proceed with transcatheter aortic valve replacement we discussed what techniques would be considered should the patient develop intraoperative complications.  The patient understands that we would not proceed with emergent median sternotomy under any circumstances.  The patient has been instructed to stop taking Eliquis in anticipation of his procedure on February 26, 2021.    I spent in excess of 90 minutes during the conduct of this  office consultation and >50% of this time involved direct face-to-face encounter with the patient for counseling and/or coordination of their care.    Valentina Gu. Roxy Manns, MD 02/17/2021 4:19 PM

## 2021-02-17 NOTE — Patient Instructions (Signed)
Stop taking Eliquis after you take your medications on July 7  Continue taking all other medications without change through the day before surgery.  Make sure to bring all of your medications with you when you come for your Pre-Admission Testing appointment at [facility] Short-Stay Department.  Have nothing to eat or drink after midnight the night before surgery.  On the morning of surgery take only Prilosec and Flomax with a sip of water.  At your appointment for Pre-Admission Testing at the [facility]Stay [facility] you will be asked to sign permission forms for your upcoming surgery.  By definition your signature on these forms implies that you and/or your designee provide full informed consent for your planned surgical procedure(s), that alternative treatment options have been discussed, that you understand and accept any and all potential risks, and that you have some understanding of what to expect for your post-operative convalescence.  For any major cardiac surgical procedure potential operative risks include but are not limited to at least some risk of death, stroke or other neurologic complication, myocardial infarction, congestive heart failure, respiratory failure, renal failure, bleeding requiring blood transfusion and/or reexploration, irregular heart rhythm, heart block or bradycardia requiring permanent pacemaker, pneumonia, pericardial effusion, pleural effusion, wound infection, pulmonary embolus or other thromboembolic complication, chronic pain, or other complications related to the specific procedure(s) performed.  For transcatheter aortic valve replacement additional risks include but are not limited to risk of paravalvular leak, valve embolization, valve thrombosis, aortic dissection, aortic rupture, ventricular septal defect or perforation, pericardial tamponade, injury of the abdominal aorta or its branches, and/or injury or occlusion of  the arteries going to your arms or legs.  Please call to schedule a follow-up appointment in our office prior to surgery if you have any unresolved questions about your planned surgical procedure, the associated risks, alternative treatment options, and/or expectations for your post-operative recovery.

## 2021-02-17 NOTE — H&P (View-Only) (Signed)
[address]       [place] [address]             [phone]     CARDIOTHORACIC SURGERY CONSULTATION REPORT  Primary Cardiologist is Minus Breeding, MD PCP is Raelene Bott, MD  Chief Complaint  Patient presents with   Aortic Stenosis    Surgical consult for TAVR, review all testing    HPI:  Patient is an 87 year old male with history of multivessel coronary artery disease status post coronary artery bypass grafting in the remote past, aortic stenosis, hypertension, COPD, paroxysmal atrial fibrillation on long-term anticoagulation using Eliquis, complete heart block status post permanent pacemaker placement in 2019, ischemic cardiomyopathy with chronic systolic congestive heart failure, previous stroke, GE reflux disease, and hiatal hernia who has been referred for surgical consultation to discuss treatment options for management of aortic stenosis.  Patient's cardiac history dates back to 2008 when he underwent coronary artery bypass grafting by Dr. Darcey Nora.  He has been followed for many years by Dr. Percival Spanish.  Patient has chronic exertional shortness of breath that has progressed.  He now gets short of breath with very low level activity.  Previous echocardiograms have documented the presence of moderately decreased left ventricular systolic function with moderate aortic stenosis.  The patient underwent diagnostic cardiac catheterization in March 2022 which revealed severe native coronary artery disease but continued patency of all bypass grafts involving all 3 vascular territories.  Follow-up transthoracic echocardiogram performed December 10, 2020 revealed some progression in the severity of the patient's aortic stenosis.  Left ventricular ejection fraction was estimated 45 to 50%.  The aortic valve was trileaflet with severe thickening and restricted leaflet mobility involving all 3 leaflets.  Peak velocity across aortic valve measured 3.2 m/s corresponding to  mean transvalvular gradient estimated 21.9 mmHg and aortic valve area calculated 1.09 cm by VTI.  The DVI was notably 0.24 and stroke-volume index 47.  The patient has been seen in consultation previously by Dr. Angelena Form.  CT angiography was performed and the patient was referred for surgical consultation. He was evaluated recently by Dr. 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  The patient has never been a smoker.  Patient is married and lives with his wife on a farm in [place].  He is accompanied by his son who is very supportive and has participated in all of his health care appointments.  The patient has been slowing down physically for several years.  He complains that he is primarily limited by severe shortness of breath.  He gets short of breath with very low level activity and occasionally at rest.  He also has some chest pain that occurs off and on both with and without exertion.  Episodes of chest pain are not necessarily related with severe shortness of breath.  He has had some dizzy spells without syncope.  He denies PND, orthopnea, or lower extremity edema.  He also has degenerative arthritis but his primary limitation is that of severe shortness of breath.  He denies any productive cough or hemoptysis.   Past Medical History:  Diagnosis Date   Aortic stenosis    mild AS 09/2017 echo   Cancer [facility])    skin   Coronary artery disease    a.  s/p CABG;   b. cath 4/12: EF 55%, 3vCAD, patent L-LAD, patent S-RCA, patent S-CFX (done after a false  pos. ETT)   Diverticular disease    GERD (gastroesophageal reflux disease)    GI bleed    Hemorrhoids    HH (hiatus hernia)    History of kidney stones    Hypertension    Osteoarthritis    Other and unspecified hyperlipidemia    Presence of permanent cardiac pacemaker    Schatzki's ring    Stroke [facility])     Past Surgical History:  Procedure  Laterality Date   ARTERIOVENOUS GRAFT PLACEMENT W/ ENDOSCOPIC VEIN HARVEST     of the right leg greater spahenous vein. Surgeon: Tharon Aquas Trigt,M.D.   COLONOSCOPY  02/24/2010   Hemorrhoids, Diverticulosis. Performed at [facility]. Normal terminal ileum. Dr. June Leap, [place] ARTERY BYPASS GRAFT  06/21/2007   CABG x 3 Surgeon Ivin Poot, MD   EYE SURGERY     bilateral cataract removal   hip replace  06/09/2004   left hip Surgeon Pietro Cassis. Alvan Dame, MD   LEFT HEART CATH AND CORS/GRAFTS ANGIOGRAPHY N/A 11/04/2020   Procedure: LEFT HEART CATH AND CORS/GRAFTS ANGIOGRAPHY;  Surgeon: Troy Sine, MD;  Location: [place] [facility];  Service: Cardiovascular;  Laterality: N/A;   MULTIPLE EXTRACTIONS WITH ALVEOLOPLASTY N/A 02/03/2021   Procedure: MULTIPLE EXTRACTION WITH ALVEOLOPLASTY;  Surgeon: Charlaine Dalton, DMD;  Location: [facility];  Service: Dentistry;  Laterality: N/A;   PACEMAKER IMPLANT N/A 10/03/2017   Procedure: PACEMAKER IMPLANT;  Surgeon: Evans Lance, MD;  Location: [place] [facility];  Service: Cardiovascular;  Laterality: N/A;   REVERSE SHOULDER ARTHROPLASTY Right 08/25/2018   Procedure: REVERSE SHOULDER ARTHROPLASTY;  Surgeon: Netta Cedars, MD;  Location: [facility];  Service: Orthopedics;  Laterality: Right;    Family History  Problem Relation Age of Onset   Heart attack Mother    Hypertension Mother    Diabetes Father    Diabetes Brother    Diabetes Sister     Social History   Socioeconomic History   Marital status: Married    Spouse name: Not on file   Number of children: 2   Years of education: Not on file   Highest education level: Not on file  Occupational History   Occupation: Reitred-Farmer    Employer: RETIRED  Tobacco Use   Smoking status: Never   Smokeless tobacco: Never  Vaping Use   Vaping Use: Never used  Substance and Sexual Activity   Alcohol use: No   Drug use: No   Sexual activity: Not on file  Other Topics Concern   Not on file   Social History Narrative   No Regular exercise. Daily Caffeine: 24 oz pepsi and 1 cup coffee.    Social Determinants of Health   Financial Resource Strain: Not on file  Food Insecurity: Not on file  Transportation Needs: Not on file  Physical Activity: Not on file  Stress: Not on file  Social Connections: Not on file  Intimate Partner Violence: Not on file    Current Outpatient Medications  Medication Sig Dispense Refill   acetaminophen (TYLENOL) 500 MG tablet Take 1,000 mg by mouth every 6 (six) hours as needed for moderate pain.     albuterol (PROVENTIL) (2.5 MG/3ML) 0.083% nebulizer solution Take 3 mLs (2.5 mg total) by nebulization every 6 (six) hours as needed for wheezing or shortness of breath. 75 mL 12   amLODipine (NORVASC) 2.5 MG tablet Take 2.5 mg by mouth in the morning.     apixaban (ELIQUIS) 5 MG TABS tablet Take  1 tablet (5 mg total) by mouth 2 (two) times daily. 60 tablet 3   atorvastatin (LIPITOR) 40 MG tablet Take 40 mg by mouth in the morning.     cyanocobalamin (,VITAMIN B-12,) 1000 MCG/ML injection Inject 1,000 mcg into the muscle every 30 (thirty) days.     donepezil (ARICEPT) 10 MG tablet Take 10 mg by mouth at bedtime.     enalapril (VASOTEC) 20 MG tablet Take 20 mg by mouth 2 (two) times daily.     Fluticasone-Umeclidin-Vilant (TRELEGY ELLIPTA) 100-62.5-25 MCG/INH AEPB Inhale 1 puff into the lungs daily. Rinse mouth after 60 each 1   furosemide (LASIX) 40 MG tablet Take 40 mg by mouth in the morning.     isosorbide mononitrate (IMDUR) 60 MG 24 hr tablet Take 1 tablet (60 mg total) by mouth daily. (Patient taking differently: Take 60 mg by mouth in the morning.) 90 tablet 3   Lidocaine 4 % PTCH Apply 1 patch topically daily as needed (pain).     metoprolol succinate (TOPROL-XL) 50 MG 24 hr tablet Take 1 tablet (50 mg total) by mouth daily. (Patient taking differently: Take 50 mg by mouth in the morning.) 90 tablet 3   nitroGLYCERIN (NITROSTAT) 0.4 MG SL tablet  Place 1 tablet (0.4 mg total) under the tongue every 5 (five) minutes as needed for chest pain. (Patient taking differently: Place 0.4 mg under the tongue every 5 (five) minutes x 3 doses as needed for chest pain.) 25 tablet 3   omeprazole (PRILOSEC) 20 MG capsule Take 20 mg by mouth in the morning.     tamsulosin (FLOMAX) 0.4 MG CAPS capsule Take 0.4 mg by mouth in the morning.     No current facility-administered medications for this visit.    No Known Allergies    Review of Systems:   General:  normal appetite, decreased energy, no weight gain, no weight loss, no fever  Cardiac:  + chest pain with exertion, + chest pain at rest, +SOB with exertion, + resting SOB, no PND, no orthopnea, no palpitations, no arrhythmia, + atrial fibrillation, no LE edema, + dizzy spells, no syncope  Respiratory:  + shortness of breath, no home oxygen, no productive cough, intermittent dry cough, no bronchitis, no wheezing, no hemoptysis, no asthma, no pain with inspiration or cough, no sleep apnea, no CPAP at night  GI:   no difficulty swallowing, no reflux, no frequent heartburn, no hiatal hernia, no abdominal pain, no constipation, no diarrhea, no hematochezia, no hematemesis, no melena  GU:   no dysuria,  no frequency, no urinary tract infection, no hematuria, no enlarged prostate, no kidney stones, no kidney disease  Vascular:  no pain suggestive of claudication, no pain in feet, no leg cramps, no varicose veins, no DVT, no non-healing foot ulcer  Neuro:   no stroke, no TIA's, no seizures, no headaches, no temporary blindness one eye,  no slurred speech, no peripheral neuropathy, no chronic pain, mild instability of gait, no memory/cognitive dysfunction  Musculoskeletal: + arthritis, no joint swelling, no myalgias, some difficulty walking, somewhat reduced mobility   Skin:   no rash, no itching, no skin infections, no pressure sores or ulcerations  Psych:   no anxiety, no depression, no nervousness, no  unusual recent stress  Eyes:   no blurry vision, no floaters, no recent vision changes, + wears glasses or contacts  ENT:   + hearing loss, no loose or painful teeth, recently had dental extraction  Hematologic:  + easy bruising,  no abnormal bleeding, no clotting disorder, no frequent epistaxis  Endocrine:  no diabetes, does not check CBG's at home     Physical Exam:   Ht 5\' 9"  (1.753 m)   Wt 165 lb (74.8 kg)   BMI 24.37 kg/m   General:  Thin, elderly and frail-appearing  HEENT:  Unremarkable   Neck:   no JVD, no bruits, no adenopathy   Chest:   clear to auscultation, symmetrical breath sounds, no wheezes, no rhonchi   CV:   RRR, grade II-III/VI systolic murmur   Abdomen:  soft, non-tender, no masses   Extremities:  warm, well-perfused, pulses palpable, no LE edema  Rectal/GU  Deferred  Neuro:   Grossly non-focal and symmetrical throughout  Skin:   Clean and dry, no rashes, no breakdown   Diagnostic Tests:  ECHOCARDIOGRAM REPORT         Patient Name:   Blake Burgess Date of Exam: 12/10/2020  Medical Rec #:  [id number]     Height:       69.0 in  Accession #:    [id number]    Weight:       170.2 lb  Date of Birth:  [birth year]     BSA:          1.929 m  Patient Age:    39 years      BP:           122/56 mmHg  Patient Gender: M             HR:           63 bpm.  Exam Location:  [address]   Procedure: 2D Echo, 3D Echo, Cardiac Doppler and Color Doppler   Indications:    I48.0 Atrial Fibrillation     History:        Patient has prior history of Echocardiogram examinations,  most                  recent 10/01/2017. CHF, CAD, Prior CABG and Pacemaker,  Stroke,                  Aortic Valve Disease, Arrythmias:Atrial Fibrillation,                  Signs/Symptoms:Chest Pain and Shortness of Breath; Risk                  Factors:Hypertension, Family History of Coronary Artery  Disease                  and Dyslipidemia. Edema, Aortic Stenosis.     Sonographer:    [place]  Referring Phys: [id number] [place]     1. Left ventricular ejection fraction, by estimation, is 45 to 50%. The  left ventricle has mildly decreased function. The left ventricle  demonstrates regional wall motion abnormalities (see scoring  diagram/findings for description). Left ventricular  diastolic parameters are consistent with Grade II diastolic dysfunction  (pseudonormalization). Elevated left atrial pressure. There is moderate  hypokinesis of the left ventricular, basal inferior wall and inferolateral  wall.   2. Right ventricular systolic function is normal. The right ventricular  size is normal. There is normal pulmonary artery systolic pressure. The  estimated right ventricular systolic pressure is 12.4 mmHg.   3. Left atrial size was moderately dilated.   4. Right atrial size was moderately dilated.   5. The mitral valve is normal in structure.  Mild mitral valve  regurgitation.   6. The aortic valve is tricuspid. There is severe calcifcation of the  aortic valve. There is severe thickening of the aortic valve. Aortic valve  regurgitation is not visualized. Moderate to severe aortic valve stenosis.   7. The inferior vena cava is normal in size with greater than 50%  respiratory variability, suggesting right atrial pressure of 3 mmHg.   Comparison(s): A prior study was performed on 10/01/2017. Prior images  reviewed side by side. The left ventricular function is worsened. The left  ventricular diastolic function is significantly worse. The left  ventricular wall motion abnormality is  worse. Aortic stenosis has worsened.   FINDINGS   Left Ventricle: Left ventricular ejection fraction, by estimation, is 45  to 50%. The left ventricle has mildly decreased function. The left  ventricle demonstrates regional wall motion abnormalities. Moderate  hypokinesis of the left ventricular, basal  inferior wall and inferolateral wall. 3D left  ventricular ejection  fraction analysis performed but not reported based on interpreter  judgement due to suboptimal quality. The left ventricular internal cavity  size was normal in size. There is no left  ventricular hypertrophy. Left ventricular diastolic parameters are  consistent with Grade II diastolic dysfunction (pseudonormalization).  Elevated left atrial pressure.   Right Ventricle: The right ventricular size is normal. No increase in  right ventricular wall thickness. Right ventricular systolic function is  normal. There is normal pulmonary artery systolic pressure. The tricuspid  regurgitant velocity is 2.64 m/s, and   with an assumed right atrial pressure of 3 mmHg, the estimated right  ventricular systolic pressure is 16.1 mmHg.   Left Atrium: Left atrial size was moderately dilated.   Right Atrium: Right atrial size was moderately dilated.   Pericardium: There is no evidence of pericardial effusion.   Mitral Valve: The mitral valve is normal in structure. Mild mitral annular  calcification. Mild mitral valve regurgitation, with centrally-directed  jet. MV peak gradient, 7.4 mmHg. The mean mitral valve gradient is 2.0  mmHg.   Tricuspid Valve: The tricuspid valve is normal in structure. Tricuspid  valve regurgitation is trivial.   Aortic Valve: The aortic valve is tricuspid. There is severe calcifcation  of the aortic valve. There is severe thickening of the aortic valve.  Aortic valve regurgitation is not visualized. Moderate to severe aortic  stenosis is present. Aortic valve mean  gradient measures 21.9 mmHg. Aortic valve peak gradient measures 41.5  mmHg. Aortic valve area, by VTI measures 1.09 cm.   Pulmonic Valve: The pulmonic valve was grossly normal. Pulmonic valve  regurgitation is not visualized.   Aorta: The aortic root and ascending aorta are structurally normal, with  no evidence of dilitation.   Venous: The inferior vena cava is normal in size  with greater than 50%  respiratory variability, suggesting right atrial pressure of 3 mmHg.   IAS/Shunts: No atrial level shunt detected by color flow Doppler.   Additional Comments: A device lead is visualized in the right ventricle  and right atrium.      LEFT VENTRICLE  PLAX 2D  LVIDd:         5.30 cm  Diastology  LVIDs:         4.30 cm  LV e' medial:    5.55 cm/s  LV PW:         0.80 cm  LV E/e' medial:  21.0  LV IVS:        0.70 cm  LV e'  lateral:   7.18 cm/s  LVOT diam:     2.40 cm  LV E/e' lateral: 16.2  LV SV:         91  LV SV Index:   47  LVOT Area:     4.52 cm                             3D Volume EF:                          3D EF:        57 %                          LV EDV:       198 ml                          LV ESV:       85 ml                          LV SV:        113 ml   RIGHT VENTRICLE  RV S prime:     12.10 cm/s  TAPSE (M-mode): 1.8 cm   LEFT ATRIUM              Index       RIGHT ATRIUM           Index  LA diam:        4.20 cm  2.18 cm/m  RA Area:     23.00 cm  LA Vol (A2C):   107.0 ml 55.47 ml/m RA Volume:   68.60 ml  35.56 ml/m  LA Vol (A4C):   71.7 ml  37.17 ml/m  LA Biplane Vol: 95.7 ml  49.61 ml/m   AORTIC VALVE  AV Area (Vmax):    1.04 cm  AV Area (Vmean):   0.98 cm  AV Area (VTI):     1.09 cm  AV Vmax:           322.15 cm/s  AV Vmean:          236.827 cm/s  AV VTI:            0.835 m  AV Peak Grad:      41.5 mmHg  AV Mean Grad:      21.9 mmHg  LVOT Vmax:         73.81 cm/s  LVOT Vmean:        51.433 cm/s  LVOT VTI:          0.202 m  LVOT/AV VTI ratio: 0.24     AORTA  Ao Root diam: 3.80 cm  Ao Asc diam:  3.50 cm   MITRAL VALVE                TRICUSPID VALVE  MV Area (PHT): cm          TR Peak grad:   27.9 mmHg  MV Peak grad:  7.4 mmHg     TR Vmax:        264.00 cm/s  MV Mean grad:  2.0 mmHg  MV Vmax:       1.36 m/s     SHUNTS  MV Vmean:      65.0 cm/s    Systemic  VTI:  0.20 m  MV Decel Time: 414 msec     Systemic Diam:  2.40 cm  MV E velocity: 116.50 cm/s  MV A velocity: 111.00 cm/s  MV E/A ratio:  1.05   Mihai Croitoru MD  Electronically signed by Sanda Klein MD  Signature Date/Time: 12/10/2020/5:05:26 PM      LEFT HEART CATH AND CORS/GRAFTS ANGIOGRAPHY    Conclusion    Mid LM to Prox LAD lesion is 95% stenosed. Mid LAD lesion is 100% stenosed. Dist LAD lesion is 25% stenosed. Ost Cx to Prox Cx lesion is 80% stenosed. Mid Cx lesion is 90% stenosed. 2nd Mrg lesion is 50% stenosed. Prox RCA lesion is 95% stenosed. Mid RCA lesion is 100% stenosed.   Severe native coronary calcification and multivessel CAD without left main and a common ostium giving  rise to a severely calcified proximal LAD with 95% stenosis and total occlusion of the LAD after a prominent septal perforating artery; and 80% ostial calcified circumflex stenosis followed by 90% proximal to mid circumflex stenosis.  There is competitive filling of the distal circumflex via the SVG supplying the OM 3 vessel.   Total mid occlusion of the native RCA.   Patent LIMA graft supplying the mid LAD with the distal LAD being a small caliber vessel.   Patent SVG supplying the OM 3 vessel of the circumflex coronary artery.   Patent SVG supplying the distal RCA.   Mild aortic valve stenosis with a 17 mm peak gradient on LV to AO pullback.   RECOMMENDATION: Increase medical therapy with possible further titration of amlodipine and beta-blocker therapy.  Recommend a follow-up 2D echo Doppler study.  The patient will follow up with Dr. Percival Spanish.    Recommendations  Antiplatelet/Anticoag Recommend Aspirin 81mg  daily for moderate CAD.     Indications  Coronary artery disease involving native coronary artery of native heart with angina pectoris ([place]) [I25.119 (ICD-10-CM)]    Procedural Details  Technical Details Blake Burgess is an 87 year old patient of Dr. Percival Spanish.  He underwent CABG revascularization surgery in 2008 by Dr.  Darcey Nora and had a LIMA placed to his LAD, and SVG to his OM 3 vessel and SVG to his RCA.  Catheterization in 2012 showed severe disease of the ostial/proximal LAD and circumflex vessel with patent grafts.  He is status post permanent pacemaker placement.  He has mild aortic stenosis noted at his last echo Doppler study in 2019.  He had recently developed episodic chest pain and was hospitalized at [facility]  He is referred by Dr. Percival Spanish for definitive cardiac catheterization.  Patient arrived to the catheterization laboratory in the fasting state.  He was hypertensive on arrival.  He was premedicated with Versed 1 mg and fentanyl 25 mcg.  Ultrasound guidance was utilized.  His right femoral artery was punctured anteriorly and a 5 French sheath was inserted without difficulty.  Diagnostic catheterization was done with Judkins 4 left and right coronary catheters.  The right catheter was used for selective angiography into both vein grafts as well as the left internal mammary artery.  The RCA catheter was advanced into the left ventricle for left ventricular pressure recording and hand-injection left ventriculography.  LV to AO pullback was performed.  Hemostasis was obtained by direct manual pressure.  The patient tolerated the procedure well. Estimated blood loss <50 mL.   During this procedure medications were administered to achieve and maintain moderate conscious sedation while the patient's heart rate, blood pressure, and oxygen  saturation were continuously monitored and I was present face-to-face 100% of this time.    Medications (Filter: Administrations occurring from 1025 to 1147 on 11/04/20)  Heparin (Porcine) in NaCl 1000-0.9 UT/500ML-% SOLN (mL) Total volume:  1,000 mL  Date/Time Rate/Dose/Volume Action   11/04/20 1038 500 mL Given   1038 500 mL Given    midazolam (VERSED) injection (mg) Total dose:  1 mg  Date/Time Rate/Dose/Volume Action   11/04/20 1049 1 mg Given     fentaNYL (SUBLIMAZE) injection (mcg) Total dose:  25 mcg  Date/Time Rate/Dose/Volume Action   11/04/20 1049 25 mcg Given    lidocaine (PF) (XYLOCAINE) 1 % injection (mL) Total volume:  13 mL  Date/Time Rate/Dose/Volume Action   11/04/20 1105 13 mL Given    iohexol (OMNIPAQUE) 350 MG/ML injection (mL) Total volume:  115 mL  Date/Time Rate/Dose/Volume Action   11/04/20 1140 115 mL Given     Sedation Time  Sedation Time Physician-1: 46 minutes 33 seconds   Contrast  Medication Name Total Dose  iohexol (OMNIPAQUE) 350 MG/ML injection 115 mL    Radiation/Fluoro  Fluoro time: 9.5 (min) DAP: 18.1 (Gycm2) Cumulative Air Kerma: 300.4 (mGy)   Coronary Findings   Diagnostic Dominance: Right  Left Main  Mid LM to Prox LAD lesion is 95% stenosed.  Left Anterior Descending  Mid LAD lesion is 100% stenosed.  Dist LAD lesion is 25% stenosed.  First Diagonal Branch  Vessel is small in size.  Left Circumflex  Ost Cx to Prox Cx lesion is 80% stenosed.  Mid Cx lesion is 90% stenosed.  First Obtuse Marginal Branch  Vessel is small in size.  Second Obtuse Marginal Branch  2nd Mrg lesion is 50% stenosed.  Right Coronary Artery  Prox RCA lesion is 95% stenosed.  Mid RCA lesion is 100% stenosed.  Right Ventricular Branch  Vessel is small in size.  LIMA Graft To Mid LAD  Graft To 3rd Mrg  Graft To Dist RCA   Intervention   No interventions have been documented.         Left Heart  Left Ventricle Mild LV dysfunction with EF estimated 45 to 50% with suggestion of very mild distal inferior hypocontractility.  Pullback pressure:  LV: 147/18 AO: 130/52    Coronary Diagrams   Diagnostic Dominance: Right    Intervention     Implants     No implant documentation for this case.    Syngo Images   Show images for CARDIAC CATHETERIZATION  Images on Long Term Storage   Show images for [place], [place] to Procedure Log  Procedure Log       Hemo Data  Flowsheet Row Most Recent Value  AO Systolic Pressure 858 mmHg  AO Diastolic Pressure 49 mmHg  AO Mean 85 mmHg  LV Systolic Pressure 850 mmHg  LV Diastolic Pressure 1 mmHg  LV EDP 24 mmHg  AOp Systolic Pressure 277 mmHg  AOp Diastolic Pressure 52 mmHg  AOp Mean Pressure 84 mmHg  LVp Systolic Pressure 412 mmHg  LVp Diastolic Pressure 15 mmHg  LVp EDP Pressure 18 mmHg    EKG: NSR w/out significant AV conduction delay (01/27/2021)   Cardiac TAVR CT   TECHNIQUE: The patient was scanned on a Siemens Force 878 slice scanner. A 120 kV retrospective scan was triggered in the descending thoracic aorta at 111 HU's. Gantry rotation speed was 270 msecs and collimation was .9 mm. No beta blockade or nitro were given. The 3D data  set was reconstructed in 5% intervals of the R-R cycle. Systolic and diastolic phases were analyzed on a dedicated work station using MPR, MIP and VRT modes. The patient received 80 cc of contrast.   FINDINGS: Aortic Valve: Tri leaflet AV Calcified with restricted leaflet motion Calcium Score 2164   Aorta: No aneurysm. Normal arch vessels Moderate calcific atherosclerosis   Sinotubular Junction: 27 mm   Ascending Thoracic Aorta: 29 mm   Aortic Arch: 27 mm   Descending Thoracic Aorta: 25 mm   Sinus of Valsalva Measurements:   Non-coronary: 35.9 mm   Right - coronary: 35.1 mm   Left - coronary: 36.7 mm   Coronary Artery Height above Annulus:   Left Main: 14,25 mm above annulus   Right Coronary: 14.24 mm above annulus   Virtual Basal Annulus Measurements:   Maximum/Minimum Diameter: 29.12 mm x 21.94 mm   Perimeter: 84 mm   Area: 530 mm 2   Coronary Arteries:   Grafts: Patent SVG to OM, Patent SVG to PDA, Patent LIMA to LAD   Optimum Fluoroscopic Angle for Delivery: LAO 7 Caudal 12 degrees   IMPRESSION: 1. Calcified Tri leaflet AV with score 2164   2.  Normal aortic root 2.9 cm moderate calcific atherosclerosis    3.  Pacing wires noted in RA/RV   4.  Patient SVG PDA, SVG OM, Patent LIMA to LAD   5. Annular area 530 mm 2 with calcium noted at base of left cusp suitable for a 26 mm Sapien 3 valve   6. Coronary arteries sufficient height above annulus for deployment and patent grafts see above   7. Optimum angiographic angle for deployment LAO 7 Caudal 12 degrees   Blake Burgess     Electronically Signed   By: Blake Burgess M.D.   On: 02/13/2021 12:50   CT ANGIOGRAPHY CHEST, ABDOMEN AND PELVIS   TECHNIQUE: Multidetector CT imaging through the chest, abdomen and pelvis was performed using the standard protocol during bolus administration of intravenous contrast. Multiplanar reconstructed images and MIPs were obtained and reviewed to evaluate the vascular anatomy.   CONTRAST:  122mL OMNIPAQUE IOHEXOL 350 MG/ML SOLN   COMPARISON:  Chest CTA 07/10/2020.  In.   FINDINGS: CTA CHEST FINDINGS   Cardiovascular: Heart size is mildly enlarged with left atrial dilatation. There is no significant pericardial fluid, thickening or pericardial calcification. There is aortic atherosclerosis, as well as atherosclerosis of the great vessels of the mediastinum and the coronary arteries, including calcified atherosclerotic plaque in the left main, left anterior descending, left circumflex and right coronary arteries. Status post median sternotomy for CABG including LIMA to the LAD. Severe thickening and calcification of the aortic valve. Calcifications of the mitral annulus.   Mediastinum/Lymph Nodes: No pathologically enlarged mediastinal or hilar lymph nodes. Please note that accurate exclusion of hilar adenopathy is limited on noncontrast CT scans. Esophagus is unremarkable in appearance. No axillary lymphadenopathy.   Lungs/Pleura: 2 mm right upper lobe pulmonary nodule (axial image 37 of series 4), stable compared to the prior examination, likely benign. No other larger more suspicious  appearing pulmonary nodules or masses are noted. No acute consolidative airspace disease. No pleural effusions.   Musculoskeletal/Soft Tissues: Median sternotomy wires. There are no aggressive appearing lytic or blastic lesions noted in the visualized portions of the skeleton.   CTA ABDOMEN AND PELVIS FINDINGS   Hepatobiliary: No suspicious cystic or solid hepatic lesions. No intra or extrahepatic biliary ductal dilatation. Status post cholecystectomy.   Pancreas: No  pancreatic mass. No pancreatic ductal dilatation. No pancreatic or peripancreatic fluid collections or inflammatory changes.   Spleen: Unremarkable.   Adrenals/Urinary Tract: Multifocal cortical scarring in the kidneys bilaterally (right greater than left). No suspicious renal lesions. No hydroureteronephrosis. Bilateral adrenal glands are normal in appearance. Urinary bladder is partially obscured by beam hardening artifact from the patient's left hip arthroplasty. Visualized portions of the urinary bladder are unremarkable.   Stomach/Bowel: The appearance of the stomach is normal. No pathologic dilatation of small bowel or colon. Numerous colonic diverticulae are noted, without surrounding inflammatory changes to suggest an acute diverticulitis at this time. Normal appendix.   Vascular/Lymphatic: Aortic atherosclerosis, without evidence of aneurysm or dissection in the abdominal or pelvic vasculature. Vascular findings and measurements pertinent to potential TAVR procedure, as detailed above. No lymphadenopathy noted in the abdomen or pelvis.   Reproductive: Prostate gland and seminal vesicles are unremarkable in appearance. Right-sided hydrocele incompletely imaged.   Other: No significant volume of ascites.  No pneumoperitoneum.   Musculoskeletal: Status post PLIF from L3-L5 with interbody cage at the L4-L5 interspace. There are no aggressive appearing lytic or blastic lesions noted in the visualized  portions of the skeleton. Status post left hip arthroplasty.   VASCULAR MEASUREMENTS PERTINENT TO TAVR:   AORTA:   Minimal Aortic Diameter-13 x 13 mm   Severity of Aortic Calcification-severe   RIGHT PELVIS:   Right Common Iliac Artery -   Minimal Diameter-7.1 x 7.1 mm   Tortuosity-mild   Calcification-moderate to severe   Right External Iliac Artery -   Minimal Diameter-8.4 x 8.6 mm   Tortuosity-mild   Calcification-mild   Right Common Femoral Artery -   Minimal Diameter-6.1 x 5.1 mm   Tortuosity-mild   Calcification-moderate to severe   LEFT PELVIS:   Left Common Iliac Artery -   Minimal Diameter-7.4 x 4.8 mm   Tortuosity-mild   Calcification-moderate to severe   Left External Iliac Artery -   Minimal Diameter-6.9 x 8.7 mm   Tortuosity-mild   Calcification-mild   Left Common Femoral Artery -   Minimal Diameter-1.4 x 1.4 mm   Tortuosity-mild   Calcification-moderate to severe   Review of the MIP images confirms the above findings.   IMPRESSION: 1. Vascular findings and measurements pertinent to potential TAVR procedure, as detailed above. 2. Severe thickening calcification of the aortic valve, compatible with reported clinical history of severe aortic stenosis. 3. Cardiomegaly with left atrial dilatation. 4. Aortic atherosclerosis, in addition to left main and 3 vessel coronary artery disease. Status post median sternotomy for CABG including LIMA to the LAD. 5. 2 mm right upper lobe pulmonary nodule, stable compared to the prior study, nonspecific, but statistically likely benign. 6. Colonic diverticulosis without evidence of acute diverticulitis at this time. 7. Additional incidental findings, as above.     Electronically Signed   By: Vinnie Langton M.D.   On: 02/13/2021 14:09       Impression:  Patient has ischemic cardiomyopathy with chronic systolic congestive heart failure and aortic stenosis which has gradually  progressed in severity and now appears bordering on stage D2 severe low-flow low gradient aortic stenosis.  The patient has longstanding symptoms of shortness of breath that are clearly multifactorial and likely a combination of chronic systolic congestive heart failure and severe COPD.  He also complains of symptoms of chest pain both with and without exertion.  Symptoms of dyspnea are consistent with class III bordering on class IV systolic congestive heart failure.  He gets  short of breath with minimal activity and occasionally at rest.  I have personally reviewed the patient's recent transthoracic echocardiogram, diagnostic cardiac catheterization, EKG, and CT angiograms.  Echocardiogram reveals moderate left ventricular systolic dysfunction with ejection fraction estimated 45 to 50%.  The aortic valve is trileaflet with severe thickening, calcification, and restricted leaflet mobility involving all 3 leaflets.  Peak velocity across aortic valve measured 3.2 m/s corresponding to mean transvalvular gradient estimated 21.9 mmHg and aortic valve area calculated 1.09 cm by VTI.  The DVI was notably 0.24 and stroke-volume index 47.  Diagnostic cardiac catheterization confirmed the presence of aortic stenosis as well as severe native coronary artery disease but continued patency of previous bypass grafts including left internal mammary artery to the distal left anterior descending coronary artery and saphenous vein grafts to the left circumflex and the distal right coronary artery.  Right heart catheterization was not performed.  Most recent EKG reveals sinus rhythm.  Patient has history of paroxysmal atrial fibrillation as well as complete heart block and has a permanent pacemaker in place.  The atrial lead for his pacemaker reportedly is nonfunctional at this time.  Plans for revise the pacemaker lead have been postponed while the patient undergoes TAVR evaluation.  CT angiography confirmed the presence of  findings consistent with severe aortic stenosis.  The patient also appears to have significant aortoiliac disease with borderline adequate access for transfemoral approach.  I agree the patient might benefit to some degree from aortic valve replacement.  I would not consider this elderly gentleman with numerous comorbid medical problems to be a candidate for conventional surgery under any circumstances.   Plan:  The patient and his son were counseled at length regarding treatment alternatives for management of severe symptomatic aortic stenosis. Alternative approaches such as conventional aortic valve replacement, transcatheter aortic valve replacement, and continued medical therapy without intervention were compared and contrasted at length.  The risks associated with conventional surgical aortic valve replacement were discussed in detail, as were expectations for post-operative convalescence, and why I would not consider this patient a candidate for conventional surgery.  Issues specific to transcatheter aortic valve replacement were discussed including questions about long term valve durability, the potential for paravalvular leak, possible increased risk of need for permanent pacemaker placement, and other technical complications related to the procedure itself.  Long-term prognosis with medical therapy was discussed. This discussion was placed in the context of the patient's own specific clinical presentation and past medical history.  All of their questions have been addressed.  The patient desires to proceed with transcatheter aortic valve replacement in the near future.  We tentatively plan for surgery on March 03, 2021.  The patient and his son understand and accept all potential associated risks of transcatheter aortic valve replacement including but not limited to risk of death, stroke, myocardial infarction, arrhythmia, heart block or bradycardia requiring pacemaker revision, bleeding, device  thrombosis or embolization, aortic dissection or other major vascular complication, possible need for conversion to alternative access, paravalvular leak, endocarditis, or late structural valve deterioration and failure.  Following a decision to proceed with transcatheter aortic valve replacement we discussed what techniques would be considered should the patient develop intraoperative complications.  The patient understands that we would not proceed with emergent median sternotomy under any circumstances.  The patient has been instructed to stop taking Eliquis in anticipation of his procedure on February 26, 2021.    I spent in excess of 90 minutes during the conduct of this  office consultation and >50% of this time involved direct face-to-face encounter with the patient for counseling and/or coordination of their care.    Valentina Gu. Roxy Manns, MD 02/17/2021 4:19 PM

## 2021-02-18 ENCOUNTER — Telehealth: Payer: Self-pay

## 2021-02-18 NOTE — Telephone Encounter (Signed)
I spoke with the pt's son, [name], and the pt is feeling better today.  The pt is currently mowing his yard on the riding [employer], nature.  Per Rackley the pt has not been compliant with his respiratory treatments. Last night the pt did an albuterol treatment and noticed improvement in his breathing. The pt plans to take his Trelegy and Albuterol as prescribed going forward.  I advised Rackley to contact the office with any additional questions or concerns.

## 2021-02-19 ENCOUNTER — Other Ambulatory Visit: Payer: Self-pay

## 2021-02-19 DIAGNOSIS — I350 Nonrheumatic aortic (valve) stenosis: Secondary | ICD-10-CM

## 2021-02-20 ENCOUNTER — Encounter: Payer: Medicare HMO | Admitting: Thoracic Surgery (Cardiothoracic Vascular Surgery)

## 2021-02-26 ENCOUNTER — Encounter: Payer: Self-pay | Admitting: Internal Medicine

## 2021-02-26 NOTE — Pre-Procedure Instructions (Signed)
Surgical Instructions    Your procedure is scheduled on Tuesday July 12th.  Report to [facility] Main Entrance "A" at 5:30 A.M., then check in with the Admitting office.  Call this number if you have problems the morning of surgery:  [phone]   If you have any questions prior to your surgery date call [phone]: Open Monday-Friday 8am-4pm    Remember:  Do not eat or drink after midnight the night before your surgery    Take these medicines the morning of surgery with A SIP OF WATER  omeprazole (PRILOSEC)  tamsulosin [facility])  Per your surgeon, STOP apixaban Arne Cleveland) on Friday, 02/27/21. Last dose should be Thursday, 02/26/21.  As of today, STOP taking any Aspirin (unless otherwise instructed by your surgeon) Aleve, Naproxen, Ibuprofen, Motrin, Advil, Goody's, BC's, all herbal medications, fish oil, and all vitamins.                     Do NOT Smoke (Tobacco/Vaping) or drink Alcohol 24 hours prior to your procedure.  If you use a CPAP at night, you may bring all equipment for your overnight stay.   Contacts, glasses, piercing's, hearing aid's, dentures or partials may not be worn into surgery, please bring cases for these belongings.    For patients admitted to the hospital, discharge time will be determined by your treatment team.   Patients discharged the day of surgery will not be allowed to drive home, and someone needs to stay with them for 24 hours.  ONLY 1 SUPPORT PERSON MAY BE PRESENT WHILE YOU ARE IN SURGERY. IF YOU ARE TO BE ADMITTED ONCE YOU ARE IN YOUR ROOM YOU WILL BE ALLOWED TWO (2) VISITORS.  Minor children may have two parents present. Special consideration for safety and communication needs will be reviewed on a case by case basis.   Special instructions:   [place]- Preparing For Surgery  Before surgery, you can play an important role. Because skin is not sterile, your skin needs to be as free of germs as possible. You can reduce the number of germs on  your skin by washing with CHG (chlorahexidine gluconate) Soap before surgery.  CHG is an antiseptic cleaner which kills germs and bonds with the skin to continue killing germs even after washing.    Oral Hygiene is also important to reduce your risk of infection.  Remember - BRUSH YOUR TEETH THE MORNING OF SURGERY WITH YOUR REGULAR TOOTHPASTE  Please do not use if you have an allergy to CHG or antibacterial soaps. If your skin becomes reddened/irritated stop using the CHG.  Do not shave (including legs and underarms) for at least 48 hours prior to first CHG shower. It is OK to shave your face.  Please follow these instructions carefully.   Shower the NIGHT BEFORE SURGERY and the MORNING OF SURGERY  If you chose to wash your hair, wash your hair first as usual with your normal shampoo.  After you shampoo, rinse your hair and body thoroughly to remove the shampoo.  Use CHG Soap as you would any other liquid soap. You can apply CHG directly to the skin and wash gently with a scrungie or a clean washcloth.   Apply the CHG Soap to your body ONLY FROM THE NECK DOWN.  Do not use on open wounds or open sores. Avoid contact with your eyes, ears, mouth and genitals (private parts). Wash Face and genitals (private parts)  with your normal soap.   Wash thoroughly, paying  special attention to the area where your surgery will be performed.  Thoroughly rinse your body with warm water from the neck down.  DO NOT shower/wash with your normal soap after using and rinsing off the CHG Soap.  Pat yourself dry with a CLEAN TOWEL.  Wear CLEAN PAJAMAS to bed the night before surgery  Place CLEAN SHEETS on your bed the night before your surgery  DO NOT SLEEP WITH PETS.   Day of Surgery: Shower with CHG soap. Do not wear jewelry. Do not wear lotions, powders, colognes, or deodorant. Men may shave face and neck. Do not bring valuables to the hospital. [facility] is not responsible for any belongings or  valuables. Wear Clean/Comfortable clothing the morning of surgery Remember to brush your teeth WITH YOUR REGULAR TOOTHPASTE.   Please read over the following fact sheets that you were given.

## 2021-02-26 NOTE — Progress Notes (Signed)
PERIOPERATIVE PRESCRIPTION FOR IMPLANTED CARDIAC DEVICE PROGRAMMING  Patient Information: Name:  Blake Burgess  DOB:  05-01-1932  MRN:  [id number]    Jacqlyn Larsen, RN  P Cv Div Heartcare Device Planned Procedure:  Transcatheter Aortic Valve Replacement  Surgeon:  Dr. Lauree Chandler and Dr. Darylene Price  Date of Procedure:  03/03/21  Cautery will be used.  Position during surgery:  Supine   Please send documentation back to:  [facility] (Fax # [phone])   Jacqlyn Larsen, RN  02/26/2021 8:34 AM  Device Information:  Clinic EP Physician:  Cristopher Peru, MD   Device Type:  Pacemaker Manufacturer and Phone #:  Medtronic: [id number] Pacemaker Dependent?:  Yes.   Intermittant in Ventricle Date of Last Device Check:  01/27/21 Normal Device Function?:  No.- Device is functioning normally with a known isse with Atrial lead.  Dr. Lovena Le saw patient on 01/27/21 and would like to wait until after TAVR to address lead issue.    Electrophysiologist's Recommendations:  Have magnet available. Provide continuous ECG monitoring when magnet is used or reprogramming is to be performed.  Procedure will likely interfere with device function.  Device should be programmed:  Asynchronous pacing during procedure and returned to normal programming after procedure  Per Device Clinic Standing Orders, York Ram, RN  5:00 PM 02/26/2021

## 2021-02-27 ENCOUNTER — Ambulatory Visit: Payer: Medicare HMO | Admitting: Cardiology

## 2021-02-27 ENCOUNTER — Encounter (HOSPITAL_COMMUNITY)
Admission: RE | Admit: 2021-02-27 | Discharge: 2021-02-27 | Disposition: A | Discharge status: Home / Self Care | Payer: Medicare HMO | Source: Ambulatory Visit | Attending: Cardiovascular Disease | Admitting: Cardiovascular Disease

## 2021-02-27 ENCOUNTER — Encounter (HOSPITAL_COMMUNITY): Payer: Self-pay

## 2021-02-27 ENCOUNTER — Other Ambulatory Visit: Payer: Self-pay

## 2021-02-27 DIAGNOSIS — Z20822 Contact with and (suspected) exposure to covid-19: Secondary | ICD-10-CM | POA: Diagnosis not present

## 2021-02-27 DIAGNOSIS — Z950 Presence of cardiac pacemaker: Secondary | ICD-10-CM | POA: Diagnosis not present

## 2021-02-27 DIAGNOSIS — I350 Nonrheumatic aortic (valve) stenosis: Secondary | ICD-10-CM

## 2021-02-27 DIAGNOSIS — Z01818 Encounter for other preprocedural examination: Secondary | ICD-10-CM

## 2021-02-27 DIAGNOSIS — Z951 Presence of aortocoronary bypass graft: Secondary | ICD-10-CM | POA: Diagnosis not present

## 2021-02-27 DIAGNOSIS — I700 Atherosclerosis of aorta: Secondary | ICD-10-CM | POA: Insufficient documentation

## 2021-02-27 HISTORY — DX: Unspecified dementia, unspecified severity, without behavioral disturbance, psychotic disturbance, mood disturbance, and anxiety: F03.90

## 2021-02-27 HISTORY — DX: Chronic obstructive pulmonary disease, unspecified: J44.9

## 2021-02-27 LAB — BLOOD GAS, ARTERIAL
Acid-base deficit: 1.4 mmol/L (ref 0.0–2.0)
Bicarbonate: 22.6 mmol/L (ref 20.0–28.0)
Drawn by: 602861
FIO2: 21
O2 Saturation: 98.8 %
Patient temperature: 37
pCO2 arterial: 36.7 mmHg (ref 32.0–48.0)
pH, Arterial: 7.407 (ref 7.350–7.450)
pO2, Arterial: 126 mmHg — ABNORMAL HIGH (ref 83.0–108.0)

## 2021-02-27 LAB — COMPREHENSIVE METABOLIC PANEL
ALT: 15 U/L (ref 0–44)
AST: 20 U/L (ref 15–41)
Albumin: 3.6 g/dL (ref 3.5–5.0)
Alkaline Phosphatase: 51 U/L (ref 38–126)
Anion gap: 10 (ref 5–15)
BUN: 31 mg/dL — ABNORMAL HIGH (ref 8–23)
CO2: 20 mmol/L — ABNORMAL LOW (ref 22–32)
Calcium: 8.9 mg/dL (ref 8.9–10.3)
Chloride: 107 mmol/L (ref 98–111)
Creatinine, Ser: 1.38 mg/dL — ABNORMAL HIGH (ref 0.61–1.24)
GFR, Estimated: 50 mL/min — ABNORMAL LOW (ref >60)
Glucose, Bld: 125 mg/dL — ABNORMAL HIGH (ref 70–99)
Potassium: 4.5 mmol/L (ref 3.5–5.1)
Sodium: 137 mmol/L (ref 135–145)
Total Bilirubin: 0.7 mg/dL (ref 0.3–1.2)
Total Protein: 6.1 g/dL — ABNORMAL LOW (ref 6.5–8.1)

## 2021-02-27 LAB — CBC
HCT: 35.6 % — ABNORMAL LOW (ref 39.0–52.0)
Hemoglobin: 11.3 g/dL — ABNORMAL LOW (ref 13.0–17.0)
MCH: 31.6 pg (ref 26.0–34.0)
MCHC: 31.7 g/dL (ref 30.0–36.0)
MCV: 99.4 fL (ref 80.0–100.0)
Platelets: 297 10*3/uL (ref 150–400)
RBC: 3.58 MIL/uL — ABNORMAL LOW (ref 4.22–5.81)
RDW: 15.3 % (ref 11.5–15.5)
WBC: 7.2 10*3/uL (ref 4.0–10.5)
nRBC: 0 % (ref 0.0–0.2)

## 2021-02-27 LAB — PROTIME-INR
INR: 1 (ref 0.8–1.2)
Prothrombin Time: 13.4 seconds (ref 11.4–15.2)

## 2021-02-27 LAB — SARS CORONAVIRUS 2 (TAT 6-24 HRS): SARS Coronavirus 2: NEGATIVE

## 2021-02-27 LAB — TYPE AND SCREEN
ABO/RH(D): A POS
Antibody Screen: NEGATIVE

## 2021-02-27 LAB — SURGICAL PCR SCREEN
MRSA, PCR: NEGATIVE
Staphylococcus aureus: NEGATIVE

## 2021-02-27 LAB — URINALYSIS, ROUTINE W REFLEX MICROSCOPIC
Bilirubin Urine: NEGATIVE
Glucose, UA: NEGATIVE mg/dL
Hgb urine dipstick: NEGATIVE
Ketones, ur: NEGATIVE mg/dL
Leukocytes,Ua: NEGATIVE
Nitrite: NEGATIVE
Protein, ur: NEGATIVE mg/dL
Specific Gravity, Urine: 1.006 (ref 1.005–1.030)
pH: 5 (ref 5.0–8.0)

## 2021-02-27 NOTE — Progress Notes (Signed)
[facility] Cardiologist -  Dr. Jenkins Rouge  PPM/ICD - Medtronic Pacemaker Device Orders - Electrophysiologist's Recommendations:   ? Have magnet available. ? Provide continuous ECG monitoring when magnet is used or reprogramming is to be performed.  Procedure will likely interfere with device function.  Device should be programmed:  Asynchronous pacing during procedure and returned to normal programming after procedure   Per Device Clinic Standing Orders, York Ram, RN  5:00 PM 02/26/2021  Rep Notified - Gae Dry and [place] [facility]  Chest x-ray - 02/27/21 EKG - 02/27/21 Stress Test - 07/29/20 ECHO - 12/10/20 Cardiac Cath - 10/25/20  Sleep Study - denies CPAP - n/a  Fasting Blood Sugar - n/a  Blood Thinner Instructions: Per your surgeon, STOP apixaban Arne Cleveland) on Friday, 02/27/21. Last dose should be Thursday, 02/26/21. Aspirin Instructions: n/a  ERAS Protcol - n/a PRE-SURGERY Ensure or G2- n/a  COVID TEST- 02/27/21 at PAT appointment. Pending   Anesthesia review: Yes.  Patient denies shortness of breath, fever, cough and chest pain at PAT appointment   All instructions explained to the patient, with a verbal understanding of the material. Patient agrees to go over the instructions while at home for a better understanding. Patient also instructed to self quarantine after being tested for COVID-19. The opportunity to ask questions was provided.

## 2021-02-27 NOTE — Progress Notes (Signed)
Patient unable to answer some questions during PAT appointment. Patient's son who is POA present and answered most of the medical history questions for the patient and signed the consent form. Patient will need his son present on day of surgery pre-operatively. [name], RN made aware and has ok'd that patient's son [name] can be present with patient pre-operatively.

## 2021-03-02 MED ORDER — NOREPINEPHRINE 4 MG/250ML-% IV SOLN
0.0000 ug/min | INTRAVENOUS | Status: DC
Start: 2021-03-03 — End: 2021-03-03
  Filled 2021-03-02: qty 250

## 2021-03-02 MED ORDER — POTASSIUM CHLORIDE 2 MEQ/ML IV SOLN
80.0000 meq | INTRAVENOUS | Status: DC
  Filled 2021-03-02: qty 40

## 2021-03-02 MED ORDER — CEFAZOLIN SODIUM-DEXTROSE 2-4 GM/100ML-% IV SOLN
2.0000 g | INTRAVENOUS | Status: AC
  Administered 2021-03-03: 2 g via INTRAVENOUS
  Filled 2021-03-02: qty 100

## 2021-03-02 MED ORDER — MAGNESIUM SULFATE 50 % IJ SOLN
40.0000 meq | INTRAMUSCULAR | Status: DC
  Filled 2021-03-02: qty 9.85

## 2021-03-02 MED ORDER — DEXMEDETOMIDINE HCL IN NACL 400 MCG/100ML IV SOLN
0.1000 ug/kg/h | INTRAVENOUS | Status: AC
  Administered 2021-03-03: 36.16 ug via INTRAVENOUS
  Administered 2021-03-03: .2 ug/kg/h via INTRAVENOUS
  Filled 2021-03-02: qty 100

## 2021-03-02 MED ORDER — SODIUM CHLORIDE 0.9 % IV SOLN
INTRAVENOUS | Status: DC
  Filled 2021-03-02: qty 30

## 2021-03-03 ENCOUNTER — Encounter (HOSPITAL_COMMUNITY): Payer: Self-pay | Admitting: Cardiovascular Disease

## 2021-03-03 ENCOUNTER — Inpatient Hospital Stay (HOSPITAL_COMMUNITY): Payer: Medicare HMO | Admitting: Certified Registered Nurse Anesthetist

## 2021-03-03 ENCOUNTER — Inpatient Hospital Stay (HOSPITAL_COMMUNITY): Payer: Medicare HMO

## 2021-03-03 ENCOUNTER — Other Ambulatory Visit: Payer: Self-pay | Admitting: Thoracic Surgery (Cardiothoracic Vascular Surgery)

## 2021-03-03 ENCOUNTER — Other Ambulatory Visit: Payer: Self-pay

## 2021-03-03 ENCOUNTER — Encounter (HOSPITAL_COMMUNITY)
Admission: RE | Disposition: A | Discharge status: Home / Self Care | Payer: Medicare HMO | Source: Home / Self Care | Attending: Thoracic Surgery (Cardiothoracic Vascular Surgery)

## 2021-03-03 ENCOUNTER — Inpatient Hospital Stay (HOSPITAL_COMMUNITY): Payer: Medicare HMO | Admitting: Vascular Surgery

## 2021-03-03 ENCOUNTER — Inpatient Hospital Stay (HOSPITAL_COMMUNITY)
Admission: RE | Admit: 2021-03-03 | Discharge: 2021-03-03 | Disposition: A | Discharge status: Home / Self Care | Payer: Medicare HMO | Source: Ambulatory Visit | Attending: Cardiovascular Disease | Admitting: Cardiovascular Disease

## 2021-03-03 ENCOUNTER — Inpatient Hospital Stay (HOSPITAL_COMMUNITY)
Admission: RE | Admit: 2021-03-03 | Discharge: 2021-03-04 | DRG: 267 | Disposition: A | Discharge status: Home / Self Care | Payer: Medicare HMO | Attending: Thoracic Surgery (Cardiothoracic Vascular Surgery) | Admitting: Thoracic Surgery (Cardiothoracic Vascular Surgery)

## 2021-03-03 DIAGNOSIS — Z952 Presence of prosthetic heart valve: Secondary | ICD-10-CM

## 2021-03-03 DIAGNOSIS — I252 Old myocardial infarction: Secondary | ICD-10-CM | POA: Diagnosis not present

## 2021-03-03 DIAGNOSIS — I255 Ischemic cardiomyopathy: Secondary | ICD-10-CM | POA: Diagnosis present

## 2021-03-03 DIAGNOSIS — I110 Hypertensive heart disease with heart failure: Secondary | ICD-10-CM | POA: Diagnosis present

## 2021-03-03 DIAGNOSIS — E785 Hyperlipidemia, unspecified: Secondary | ICD-10-CM | POA: Diagnosis present

## 2021-03-03 DIAGNOSIS — Z7901 Long term (current) use of anticoagulants: Secondary | ICD-10-CM

## 2021-03-03 DIAGNOSIS — I25119 Atherosclerotic heart disease of native coronary artery with unspecified angina pectoris: Secondary | ICD-10-CM | POA: Diagnosis present

## 2021-03-03 DIAGNOSIS — I350 Nonrheumatic aortic (valve) stenosis: Principal | ICD-10-CM | POA: Diagnosis present

## 2021-03-03 DIAGNOSIS — J449 Chronic obstructive pulmonary disease, unspecified: Secondary | ICD-10-CM | POA: Diagnosis present

## 2021-03-03 DIAGNOSIS — Z833 Family history of diabetes mellitus: Secondary | ICD-10-CM

## 2021-03-03 DIAGNOSIS — Z7951 Long term (current) use of inhaled steroids: Secondary | ICD-10-CM

## 2021-03-03 DIAGNOSIS — Z951 Presence of aortocoronary bypass graft: Secondary | ICD-10-CM | POA: Diagnosis not present

## 2021-03-03 DIAGNOSIS — K219 Gastro-esophageal reflux disease without esophagitis: Secondary | ICD-10-CM | POA: Diagnosis present

## 2021-03-03 DIAGNOSIS — J9811 Atelectasis: Secondary | ICD-10-CM

## 2021-03-03 DIAGNOSIS — Z79899 Other long term (current) drug therapy: Secondary | ICD-10-CM | POA: Diagnosis not present

## 2021-03-03 DIAGNOSIS — Z8673 Personal history of transient ischemic attack (TIA), and cerebral infarction without residual deficits: Secondary | ICD-10-CM | POA: Diagnosis not present

## 2021-03-03 DIAGNOSIS — I480 Paroxysmal atrial fibrillation: Secondary | ICD-10-CM | POA: Diagnosis present

## 2021-03-03 DIAGNOSIS — Z20822 Contact with and (suspected) exposure to covid-19: Secondary | ICD-10-CM | POA: Diagnosis present

## 2021-03-03 DIAGNOSIS — Z950 Presence of cardiac pacemaker: Secondary | ICD-10-CM

## 2021-03-03 DIAGNOSIS — Z96611 Presence of right artificial shoulder joint: Secondary | ICD-10-CM | POA: Diagnosis present

## 2021-03-03 DIAGNOSIS — Z96642 Presence of left artificial hip joint: Secondary | ICD-10-CM | POA: Diagnosis present

## 2021-03-03 DIAGNOSIS — F0390 Unspecified dementia without behavioral disturbance: Secondary | ICD-10-CM | POA: Diagnosis present

## 2021-03-03 DIAGNOSIS — Z8249 Family history of ischemic heart disease and other diseases of the circulatory system: Secondary | ICD-10-CM

## 2021-03-03 DIAGNOSIS — Z006 Encounter for examination for normal comparison and control in clinical research program: Secondary | ICD-10-CM

## 2021-03-03 DIAGNOSIS — I5022 Chronic systolic (congestive) heart failure: Secondary | ICD-10-CM | POA: Diagnosis present

## 2021-03-03 DIAGNOSIS — Z9049 Acquired absence of other specified parts of digestive tract: Secondary | ICD-10-CM

## 2021-03-03 HISTORY — PX: TRANSCATHETER AORTIC VALVE REPLACEMENT, TRANSFEMORAL: SHX6400

## 2021-03-03 HISTORY — PX: INTRAOPERATIVE TRANSTHORACIC ECHOCARDIOGRAM: SHX6523

## 2021-03-03 HISTORY — PX: ULTRASOUND GUIDANCE FOR VASCULAR ACCESS: SHX6516

## 2021-03-03 LAB — POCT I-STAT, CHEM 8
BUN: 34 mg/dL — ABNORMAL HIGH (ref 8–23)
BUN: 32 mg/dL — ABNORMAL HIGH (ref 8–23)
Calcium, Ion: 1.27 mmol/L (ref 1.15–1.40)
Calcium, Ion: 1.3 mmol/L (ref 1.15–1.40)
Chloride: 108 mmol/L (ref 98–111)
Chloride: 108 mmol/L (ref 98–111)
Creatinine, Ser: 1.4 mg/dL — ABNORMAL HIGH (ref 0.61–1.24)
Creatinine, Ser: 1.4 mg/dL — ABNORMAL HIGH (ref 0.61–1.24)
Glucose, Bld: 124 mg/dL — ABNORMAL HIGH (ref 70–99)
Glucose, Bld: 135 mg/dL — ABNORMAL HIGH (ref 70–99)
HCT: 30 % — ABNORMAL LOW (ref 39.0–52.0)
HCT: 26 % — ABNORMAL LOW (ref 39.0–52.0)
Hemoglobin: 10.2 g/dL — ABNORMAL LOW (ref 13.0–17.0)
Hemoglobin: 8.8 g/dL — ABNORMAL LOW (ref 13.0–17.0)
Potassium: 4.4 mmol/L (ref 3.5–5.1)
Potassium: 4.1 mmol/L (ref 3.5–5.1)
Sodium: 141 mmol/L (ref 135–145)
Sodium: 141 mmol/L (ref 135–145)
TCO2: 22 mmol/L (ref 22–32)
TCO2: 24 mmol/L (ref 22–32)

## 2021-03-03 LAB — ECHOCARDIOGRAM LIMITED
AR max vel: 1.97 cm2
AV Area VTI: 2.28 cm2
AV Area mean vel: 2.41 cm2
AV Mean grad: 3 mmHg
AV Peak grad: 6.6 mmHg
Ao pk vel: 1.28 m/s

## 2021-03-03 SURGERY — IMPLANTATION, AORTIC VALVE, TRANSCATHETER, FEMORAL APPROACH
Anesthesia: Monitor Anesthesia Care | Site: Groin

## 2021-03-03 MED ORDER — OXYCODONE HCL 5 MG PO TABS: 5.0000 mg | ORAL_TABLET | ORAL | Status: DC | PRN

## 2021-03-03 MED ORDER — FLUTICASONE-UMECLIDIN-VILANT 100-62.5-25 MCG/INH IN AEPB: 1.0000 | INHALATION_SPRAY | Freq: Every day | RESPIRATORY_TRACT | Status: DC

## 2021-03-03 MED ORDER — ALBUTEROL SULFATE (2.5 MG/3ML) 0.083% IN NEBU: 2.5000 mg | INHALATION_SOLUTION | Freq: Four times a day (QID) | RESPIRATORY_TRACT | Status: DC | PRN

## 2021-03-03 MED ORDER — LIDOCAINE HCL 1 % IJ SOLN
INTRAMUSCULAR | Status: DC | PRN
  Administered 2021-03-03: 15 mL

## 2021-03-03 MED ORDER — NITROGLYCERIN IN D5W 200-5 MCG/ML-% IV SOLN: 0.0000 ug/min | INTRAVENOUS | Status: DC

## 2021-03-03 MED ORDER — CHLORHEXIDINE GLUCONATE 0.12 % MT SOLN: 15.0000 mL | Freq: Once | OROMUCOSAL | Status: DC

## 2021-03-03 MED ORDER — METOPROLOL TARTRATE 5 MG/5ML IV SOLN
2.5000 mg | INTRAVENOUS | Status: DC | PRN
Start: 2021-03-03 — End: 2021-03-04

## 2021-03-03 MED ORDER — LACTATED RINGERS IV SOLN: INTRAVENOUS | Status: DC | PRN

## 2021-03-03 MED ORDER — SODIUM CHLORIDE 0.9 % IV SOLN: 250.0000 mL | INTRAVENOUS | Status: DC

## 2021-03-03 MED ORDER — CHLORHEXIDINE GLUCONATE 4 % EX LIQD: 60.0000 mL | Freq: Once | CUTANEOUS | Status: DC

## 2021-03-03 MED ORDER — LACTATED RINGERS IV SOLN: INTRAVENOUS | Status: DC

## 2021-03-03 MED ORDER — PROPOFOL 10 MG/ML IV BOLUS
INTRAVENOUS | Status: AC
  Filled 2021-03-03: qty 20

## 2021-03-03 MED ORDER — PROPOFOL 500 MG/50ML IV EMUL
INTRAVENOUS | Status: DC | PRN
  Administered 2021-03-03: 5 ug/kg/min via INTRAVENOUS

## 2021-03-03 MED ORDER — HEPARIN SODIUM (PORCINE) 1000 UNIT/ML IJ SOLN
INTRAMUSCULAR | Status: AC
  Filled 2021-03-03: qty 2

## 2021-03-03 MED ORDER — PROPOFOL 10 MG/ML IV BOLUS
INTRAVENOUS | Status: DC | PRN
  Administered 2021-03-03: 20 mg via INTRAVENOUS
  Administered 2021-03-03: 10 mg via INTRAVENOUS

## 2021-03-03 MED ORDER — TAMSULOSIN HCL 0.4 MG PO CAPS
0.4000 mg | ORAL_CAPSULE | Freq: Every morning | ORAL | Status: DC
  Administered 2021-03-04: 0.4 mg via ORAL
  Filled 2021-03-03: qty 1

## 2021-03-03 MED ORDER — CHLORHEXIDINE GLUCONATE 0.12 % MT SOLN
15.0000 mL | Freq: Once | OROMUCOSAL | Status: AC
  Administered 2021-03-03: 15 mL via OROMUCOSAL
  Filled 2021-03-03: qty 15

## 2021-03-03 MED ORDER — IODIXANOL 320 MG/ML IV SOLN
INTRAVENOUS | Status: DC | PRN
  Administered 2021-03-03: 15 mL via INTRA_ARTERIAL

## 2021-03-03 MED ORDER — SODIUM CHLORIDE 0.9 % IV SOLN: 250.0000 mL | INTRAVENOUS | Status: DC | PRN

## 2021-03-03 MED ORDER — SODIUM CHLORIDE 0.9% FLUSH
3.0000 mL | Freq: Two times a day (BID) | INTRAVENOUS | Status: DC
  Administered 2021-03-03 – 2021-03-04 (×2): 3 mL via INTRAVENOUS

## 2021-03-03 MED ORDER — ASPIRIN 81 MG PO CHEW
81.0000 mg | CHEWABLE_TABLET | Freq: Every day | ORAL | Status: DC
  Administered 2021-03-04: 81 mg via ORAL
  Filled 2021-03-03: qty 1

## 2021-03-03 MED ORDER — CHLORHEXIDINE GLUCONATE 4 % EX LIQD: 30.0000 mL | CUTANEOUS | Status: DC

## 2021-03-03 MED ORDER — FENTANYL CITRATE (PF) 250 MCG/5ML IJ SOLN
INTRAMUSCULAR | Status: AC
  Filled 2021-03-03: qty 5

## 2021-03-03 MED ORDER — PHENYLEPHRINE HCL-NACL 10-0.9 MG/250ML-% IV SOLN
0.0000 ug/min | INTRAVENOUS | Status: DC
  Filled 2021-03-03: qty 250

## 2021-03-03 MED ORDER — SODIUM CHLORIDE 0.9% FLUSH: 3.0000 mL | INTRAVENOUS | Status: DC | PRN

## 2021-03-03 MED ORDER — ONDANSETRON HCL 4 MG/2ML IJ SOLN: 4.0000 mg | Freq: Four times a day (QID) | INTRAMUSCULAR | Status: DC | PRN

## 2021-03-03 MED ORDER — ACETAMINOPHEN 500 MG PO TABS: 1000.0000 mg | ORAL_TABLET | Freq: Four times a day (QID) | ORAL | Status: DC | PRN

## 2021-03-03 MED ORDER — PROTAMINE SULFATE 10 MG/ML IV SOLN
INTRAVENOUS | Status: DC | PRN
  Administered 2021-03-03: 150 mg via INTRAVENOUS

## 2021-03-03 MED ORDER — ORAL CARE MOUTH RINSE: 15.0000 mL | Freq: Once | OROMUCOSAL | Status: AC

## 2021-03-03 MED ORDER — ACETAMINOPHEN 325 MG PO TABS: 650.0000 mg | ORAL_TABLET | Freq: Four times a day (QID) | ORAL | Status: DC | PRN

## 2021-03-03 MED ORDER — FLUTICASONE FUROATE-VILANTEROL 100-25 MCG/INH IN AEPB
1.0000 | INHALATION_SPRAY | Freq: Every day | RESPIRATORY_TRACT | Status: DC
  Filled 2021-03-03: qty 28

## 2021-03-03 MED ORDER — CLOPIDOGREL BISULFATE 75 MG PO TABS
75.0000 mg | ORAL_TABLET | Freq: Every day | ORAL | Status: DC
  Administered 2021-03-04: 75 mg via ORAL
  Filled 2021-03-03: qty 1

## 2021-03-03 MED ORDER — ACETAMINOPHEN 650 MG RE SUPP: 650.0000 mg | Freq: Four times a day (QID) | RECTAL | Status: DC | PRN

## 2021-03-03 MED ORDER — PANTOPRAZOLE SODIUM 40 MG PO TBEC
40.0000 mg | DELAYED_RELEASE_TABLET | Freq: Every day | ORAL | Status: DC
  Administered 2021-03-04: 40 mg via ORAL
  Filled 2021-03-03: qty 1

## 2021-03-03 MED ORDER — DONEPEZIL HCL 5 MG PO TABS
10.0000 mg | ORAL_TABLET | Freq: Every day | ORAL | Status: DC
  Administered 2021-03-03: 10 mg via ORAL
  Filled 2021-03-03: qty 2

## 2021-03-03 MED ORDER — MORPHINE SULFATE (PF) 2 MG/ML IV SOLN: 1.0000 mg | INTRAVENOUS | Status: DC | PRN

## 2021-03-03 MED ORDER — CEFAZOLIN SODIUM-DEXTROSE 2-4 GM/100ML-% IV SOLN
2.0000 g | Freq: Three times a day (TID) | INTRAVENOUS | Status: AC
  Administered 2021-03-03 (×2): 2 g via INTRAVENOUS
  Filled 2021-03-03 (×2): qty 100

## 2021-03-03 MED ORDER — CHLORHEXIDINE GLUCONATE 4 % EX LIQD
60.0000 mL | Freq: Once | CUTANEOUS | Status: DC
Start: 2021-03-03 — End: 2021-03-03

## 2021-03-03 MED ORDER — HEPARIN SODIUM (PORCINE) 1000 UNIT/ML IJ SOLN
INTRAMUSCULAR | Status: DC | PRN
  Administered 2021-03-03: 5000 [IU] via INTRAVENOUS
  Administered 2021-03-03: 11000 [IU] via INTRAVENOUS

## 2021-03-03 MED ORDER — FENTANYL CITRATE (PF) 250 MCG/5ML IJ SOLN
INTRAMUSCULAR | Status: DC | PRN
  Administered 2021-03-03 (×2): 25 ug via INTRAVENOUS

## 2021-03-03 MED ORDER — SODIUM CHLORIDE 0.9 % IV SOLN: INTRAVENOUS | Status: AC

## 2021-03-03 MED ORDER — TRAMADOL HCL 50 MG PO TABS: 50.0000 mg | ORAL_TABLET | ORAL | Status: DC | PRN

## 2021-03-03 MED ORDER — NITROGLYCERIN 0.4 MG SL SUBL: 0.4000 mg | SUBLINGUAL_TABLET | SUBLINGUAL | Status: DC | PRN

## 2021-03-03 MED ORDER — UMECLIDINIUM BROMIDE 62.5 MCG/INH IN AEPB
1.0000 | INHALATION_SPRAY | Freq: Every day | RESPIRATORY_TRACT | Status: DC
  Filled 2021-03-03: qty 7

## 2021-03-03 MED ORDER — SODIUM CHLORIDE 0.9 % IV SOLN: INTRAVENOUS | Status: DC

## 2021-03-03 MED ORDER — EPHEDRINE SULFATE 50 MG/ML IJ SOLN
INTRAMUSCULAR | Status: DC | PRN
  Administered 2021-03-03: 10 mg via INTRAVENOUS

## 2021-03-03 MED ORDER — HEPARIN 6000 UNIT IRRIGATION SOLUTION
Status: DC | PRN
  Administered 2021-03-03 (×3): 1

## 2021-03-03 SURGICAL SUPPLY — 82 items
BAG COUNTER SPONGE SURGICOUNT (BAG) ×4 IMPLANT
BAG DECANTER FOR FLEXI CONT (MISCELLANEOUS) IMPLANT
BAG SNAP BAND KOVER 36X36 (MISCELLANEOUS) ×4 IMPLANT
BLADE CLIPPER SURG (BLADE) IMPLANT
BLADE OSCILLATING /SAGITTAL (BLADE) IMPLANT
BLADE STERNUM SYSTEM 6 (BLADE) IMPLANT
BLADE SURG 10 STRL SS (BLADE) IMPLANT
CABLE ADAPT CONN TEMP 6FT (ADAPTER) ×4 IMPLANT
CATH DIAG EXPO 6F AL1 (CATHETERS) IMPLANT
CATH DIAG EXPO 6F VENT PIG 145 (CATHETERS) ×8 IMPLANT
CATH EXTERNAL FEMALE PUREWICK (CATHETERS) IMPLANT
CATH INFINITI 6F AL2 (CATHETERS) ×4 IMPLANT
CATH S G BIP PACING (CATHETERS) ×4 IMPLANT
CHLORAPREP W/TINT 26 (MISCELLANEOUS) ×4 IMPLANT
CLIP VESOCCLUDE MED 24/CT (CLIP) IMPLANT
CLIP VESOCCLUDE SM WIDE 24/CT (CLIP) IMPLANT
CLOSURE MYNX CONTROL 6F/7F (Vascular Products) ×4 IMPLANT
CNTNR URN SCR LID CUP LEK RST (MISCELLANEOUS) ×6 IMPLANT
CONT SPEC 4OZ STRL OR WHT (MISCELLANEOUS) ×8
COVER BACK TABLE 80X110 HD (DRAPES) ×4 IMPLANT
DECANTER SPIKE VIAL GLASS SM (MISCELLANEOUS) ×4 IMPLANT
DERMABOND ADVANCED (GAUZE/BANDAGES/DRESSINGS) ×1
DERMABOND ADVANCED .7 DNX12 (GAUZE/BANDAGES/DRESSINGS) ×3 IMPLANT
DEVICE CLOSURE PERCLS PRGLD 6F (VASCULAR PRODUCTS) ×6 IMPLANT
DRAPE INCISE IOBAN 66X45 STRL (DRAPES) IMPLANT
DRSG TEGADERM 4X4.75 (GAUZE/BANDAGES/DRESSINGS) ×8 IMPLANT
ELECT CAUTERY BLADE 6.4 (BLADE) IMPLANT
ELECT REM PT RETURN 9FT ADLT (ELECTROSURGICAL) ×8
ELECTRODE REM PT RTRN 9FT ADLT (ELECTROSURGICAL) ×6 IMPLANT
FELT TEFLON 6X6 (MISCELLANEOUS) IMPLANT
GAUZE SPONGE 4X4 12PLY STRL (GAUZE/BANDAGES/DRESSINGS) ×4 IMPLANT
GLOVE EUDERMIC 7 POWDERFREE (GLOVE) IMPLANT
GLOVE SURG ENC MOIS LTX SZ7.5 (GLOVE) ×4 IMPLANT
GLOVE SURG ENC MOIS LTX SZ8 (GLOVE) IMPLANT
GLOVE SURG ORTHO LTX SZ7.5 (GLOVE) IMPLANT
GOWN STRL REUS W/ TWL LRG LVL3 (GOWN DISPOSABLE) IMPLANT
GOWN STRL REUS W/ TWL XL LVL3 (GOWN DISPOSABLE) ×3 IMPLANT
GOWN STRL REUS W/TWL LRG LVL3 (GOWN DISPOSABLE)
GOWN STRL REUS W/TWL XL LVL3 (GOWN DISPOSABLE) ×4
GUIDEWIRE SAFE TJ AMPLATZ EXST (WIRE) ×4 IMPLANT
INSERT FOGARTY SM (MISCELLANEOUS) IMPLANT
KIT BASIN OR (CUSTOM PROCEDURE TRAY) ×4 IMPLANT
KIT HEART LEFT (KITS) ×4 IMPLANT
KIT SUCTION CATH 14FR (SUCTIONS) IMPLANT
KIT TURNOVER KIT B (KITS) ×4 IMPLANT
LOOP VESSEL MAXI BLUE (MISCELLANEOUS) IMPLANT
LOOP VESSEL MINI RED (MISCELLANEOUS) IMPLANT
NS IRRIG 1000ML POUR BTL (IV SOLUTION) ×4 IMPLANT
PACK ENDO MINOR (CUSTOM PROCEDURE TRAY) ×4 IMPLANT
PAD ARMBOARD 7.5X6 YLW CONV (MISCELLANEOUS) ×8 IMPLANT
PAD ELECT DEFIB RADIOL ZOLL (MISCELLANEOUS) ×4 IMPLANT
PENCIL BUTTON HOLSTER BLD 10FT (ELECTRODE) IMPLANT
PERCLOSE PROGLIDE 6F (VASCULAR PRODUCTS) ×8
POSITIONER HEAD DONUT 9IN (MISCELLANEOUS) ×4 IMPLANT
SET MICROPUNCTURE 5F STIFF (MISCELLANEOUS) ×4 IMPLANT
SHEATH BRITE TIP 7FR 35CM (SHEATH) ×4 IMPLANT
SHEATH PINNACLE 6F 10CM (SHEATH) ×4 IMPLANT
SHEATH PINNACLE 8F 10CM (SHEATH) ×4 IMPLANT
SLEEVE REPOSITIONING LENGTH 30 (MISCELLANEOUS) ×4 IMPLANT
STOPCOCK MORSE 400PSI 3WAY (MISCELLANEOUS) ×8 IMPLANT
SUT ETHIBOND X763 2 0 SH 1 (SUTURE) IMPLANT
SUT GORETEX CV 4 TH 22 36 (SUTURE) IMPLANT
SUT GORETEX CV4 TH-18 (SUTURE) IMPLANT
SUT MNCRL AB 3-0 PS2 18 (SUTURE) IMPLANT
SUT PROLENE 5 0 C 1 36 (SUTURE) IMPLANT
SUT PROLENE 6 0 C 1 30 (SUTURE) IMPLANT
SUT SILK  1 MH (SUTURE) ×4
SUT SILK 1 MH (SUTURE) ×3 IMPLANT
SUT VIC AB 2-0 CT1 27 (SUTURE)
SUT VIC AB 2-0 CT1 TAPERPNT 27 (SUTURE) IMPLANT
SUT VIC AB 2-0 CTX 36 (SUTURE) IMPLANT
SUT VIC AB 3-0 SH 8-18 (SUTURE) IMPLANT
SYR 50ML LL SCALE MARK (SYRINGE) ×4 IMPLANT
SYR BULB IRRIG 60ML STRL (SYRINGE) IMPLANT
SYR MEDRAD MARK V 150ML (SYRINGE) ×4 IMPLANT
TOWEL GREEN STERILE (TOWEL DISPOSABLE) ×8 IMPLANT
TRANSDUCER W/STOPCOCK (MISCELLANEOUS) ×8 IMPLANT
TRAY FOLEY SLVR 16FR TEMP STAT (SET/KITS/TRAYS/PACK) IMPLANT
VALVE 26 ULTRA SAPIEN KIT (Valve) ×4 IMPLANT
WIRE EMERALD 3MM-J .035X150CM (WIRE) ×4 IMPLANT
WIRE EMERALD 3MM-J .035X260CM (WIRE) IMPLANT
WIRE ROSEN-J .035X260CM (WIRE) ×4 IMPLANT

## 2021-03-03 NOTE — Transfer of Care (Signed)
Immediate Anesthesia Transfer of Care Note  Patient: Blake Burgess  Procedure(s) Performed: TRANSCATHETER AORTIC VALVE REPLACEMENT, TRANSFEMORAL (Bilateral: Groin) INTRAOPERATIVE TRANSTHORACIC ECHOCARDIOGRAM (Left: Chest) ULTRASOUND GUIDANCE FOR VASCULAR ACCESS (Bilateral: Groin)  Patient Location: PACU and Cath Lab  Anesthesia Type:MAC  Level of Consciousness: awake and alert   Airway & Oxygen Therapy: Patient Spontanous Breathing  Post-op Assessment: Report given to RN and Post -op Vital signs reviewed and stable  Post vital signs: Reviewed and stable  Last Vitals:  Vitals Value Taken Time  BP 163/45 03/03/21 0926  Temp    Pulse 72 03/03/21 0926  Resp 14 03/03/21 0926  SpO2 97 % 03/03/21 0926  Vitals shown include unvalidated device data.  Last Pain:  Vitals:   03/03/21 0602  TempSrc: Oral  PainSc: 0-No pain         Complications: No notable events documented.

## 2021-03-03 NOTE — Interval H&P Note (Signed)
History and Physical Interval Note:  03/03/2021 6:04 AM  Blake Burgess  has presented today for surgery, with the diagnosis of Severe aortic stenosis.  The various methods of treatment have been discussed with the patient and family. After consideration of risks, benefits and other options for treatment, the patient has consented to  Procedure(s): TRANSCATHETER AORTIC VALVE REPLACEMENT, TRANSFEMORAL (N/A) TRANSESOPHAGEAL ECHOCARDIOGRAM (TEE) (N/A) as a surgical intervention.  The patient's history has been reviewed, patient examined, no change in status, stable for surgery.  I have reviewed the patient's chart and labs.  Questions were answered to the patient's satisfaction.     Rexene Alberts

## 2021-03-03 NOTE — Progress Notes (Signed)
Patient arrived to 4E14 from the cath lab. Bilateral groin sites level 0 with gauze dressing c/d/I. Bilateral pedal pulses dopplered successfully. VSS. Patient placed on telemetry and CCMD notified. Patient educated about bedrest and oriented to the room. Bed low and locked with bed alarm engaged and call bell left within reach.   Gailen Shelter RN

## 2021-03-03 NOTE — Progress Notes (Signed)
Called to 4E to assist with oozing groin site. Upon arrival guaze saturated on right side. R side non primary side with 6 Fr Mynx in the artery and venous sheath pulled with manual pressure. Nurse at bedside throughout interaction. Old dressing removed to assess site. Site was soft and did not appear to be actively bleeding or oozing. Nurse advised she had held pressure prior to my arrival. [place] guaze and tegaderm applied to site and site was level 0.

## 2021-03-03 NOTE — Progress Notes (Signed)
Mobility Specialist: Progress Note   03/03/21 1758  Mobility  Activity Ambulated in hall  Level of Assistance Standby assist, set-up cues, supervision of patient - no hands on  Assistive Device Front wheel walker  Distance Ambulated (ft) 360 ft  Mobility Ambulated with assistance in hallway  Mobility Response Tolerated well  Mobility performed by Mobility specialist  $Mobility charge 1 Mobility   Pre-Mobility: 70 HR, 167/53 BP, 98% SpO2 Post-Mobility: 80 HR, 186/83 BP, 99% SpO2  Pt asx throughout ambulation. RN present in the room at the end of the session to check groin sites and is aware of elevated BP. Pt back in the bed with call bell at his side.   [facility] Shacora Zynda Mobility Specialist Mobility Specialist Phone: [phone]

## 2021-03-03 NOTE — Progress Notes (Signed)
  Echocardiogram 2D Echocardiogram has been performed.  Michiel Cowboy 03/03/2021, 8:59 AM

## 2021-03-03 NOTE — Op Note (Signed)
HEART AND VASCULAR CENTER   MULTIDISCIPLINARY HEART VALVE TEAM   TAVR OPERATIVE NOTE   Date of Procedure:  03/03/2021  Preoperative Diagnosis: Severe Aortic Stenosis   Postoperative Diagnosis: Same   Procedure:   Transcatheter Aortic Valve Replacement - Percutaneous Left Transfemoral Approach  Edwards Sapien 3 Ultra THV (size 26 mm, model # [id number], serial # [id number])   Co-Surgeons:  Valentina Gu. Roxy Manns, MD and Lauree Chandler, MD  Anesthesiologist:  Myrtie Soman, MD  Echocardiographer:  Jenkins Rouge, MD  Pre-operative Echo Findings: Severe aortic stenosis Normal left ventricular systolic function  Post-operative Echo Findings: No paravalvular leak Unchanged left ventricular systolic function   BRIEF CLINICAL NOTE AND INDICATIONS FOR SURGERY  Patient is an 87 year old male with history of multivessel coronary artery disease status post coronary artery bypass grafting in the remote past, aortic stenosis, hypertension, COPD, paroxysmal atrial fibrillation on long-term anticoagulation using Eliquis, complete heart block status post permanent pacemaker placement in 2019, ischemic cardiomyopathy with chronic systolic congestive heart failure, previous stroke, GE reflux disease, and hiatal hernia who has been referred for surgical consultation to discuss treatment options for management of aortic stenosis.  Patient's cardiac history dates back to 2008 when he underwent coronary artery bypass grafting by Dr. Darcey Nora.  He has been followed for many years by Dr. Percival Spanish.  Patient has chronic exertional shortness of breath that has progressed.  He now gets short of breath with very low level activity.  Previous echocardiograms have documented the presence of moderately decreased left ventricular systolic function with moderate aortic stenosis.  The patient underwent diagnostic cardiac catheterization in March 2022 which revealed severe native coronary artery disease but continued  patency of all bypass grafts involving all 3 vascular territories.  Follow-up transthoracic echocardiogram performed December 10, 2020 revealed some progression in the severity of the patient's aortic stenosis.  Left ventricular ejection fraction was estimated 45 to 50%.  The aortic valve was trileaflet with severe thickening and restricted leaflet mobility involving all 3 leaflets.  Peak velocity across aortic valve measured 3.2 m/s corresponding to mean transvalvular gradient estimated 21.9 mmHg and aortic valve area calculated 1.09 cm by VTI.  The DVI was notably 0.24 and stroke-volume index 47.  The patient has been seen in consultation previously by Dr. Angelena Form.  CT angiography was performed and the patient was referred for surgical consultation. He was evaluated recently by Dr. 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  The patient has never been a smoker.  During the course of the patient's preoperative work up they have been evaluated comprehensively by a multidisciplinary team of specialists coordinated through the [facility] in the [facility].  They have been demonstrated to suffer from symptomatic severe aortic stenosis as noted above. The patient has been counseled extensively as to the relative risks and benefits of all options for the treatment of severe aortic stenosis including long term medical therapy, conventional surgery for aortic valve replacement, and transcatheter aortic valve replacement.  All questions have been answered, and the patient provides full informed consent for the operation as described.   DETAILS OF THE OPERATIVE PROCEDURE  PREPARATION:    The patient is brought to the operating room on the above mentioned date and appropriate monitoring was established by the anesthesia team. The patient is placed in the supine position on the  operating table.  Intravenous antibiotics are administered. The patient is  monitored closely throughout the procedure under conscious sedation.  Baseline transthoracic echocardiogram was performed. The patient's chest, abdomen, both groins, and both lower extremities are prepared and draped in a sterile manner. A time out procedure is performed.   PERIPHERAL ACCESS:    Using the modified Seldinger technique, femoral arterial and venous access was obtained with placement of 6 Fr sheaths on the right side.  A pigtail diagnostic catheter was passed through the right arterial sheath under fluoroscopic guidance into the aortic root.  A temporary transvenous pacemaker catheter was passed through the right femoral venous sheath under fluoroscopic guidance into the right ventricle.  The pacemaker was tested to ensure stable lead placement and pacemaker capture. Aortic root angiography was performed in order to determine the optimal angiographic angle for valve deployment.   TRANSFEMORAL ACCESS:   Percutaneous transfemoral access and sheath placement was performed using ultrasound guidance.  The left common femoral artery was cannulated using a micropuncture needle and appropriate location was verified using hand injection angiogram.  A pair of Abbott Perclose percutaneous closure devices were placed and a 6 French sheath replaced into the femoral artery.  The patient was heparinized systemically and ACT verified > 250 seconds.    A 14 Fr transfemoral E-sheath was introduced into the left common femoral artery after progressively dilating over an Amplatz superstiff wire. An AL-2 catheter was used to direct a straight-tip exchange length wire across the native aortic valve into the left ventricle. This was exchanged out for a pigtail catheter and position was confirmed in the LV apex.  The pigtail catheter was exchanged for an Amplatz Extra-stiff wire in the LV apex.  Echocardiography was utilized to confirm  appropriate wire position and no sign of entanglement in the mitral subvalvular apparatus.   TRANSCATHETER HEART VALVE DEPLOYMENT:   An Edwards Sapien 3 Ultra transcatheter heart valve (size 26 mm, model #[id number], serial #[id number]) was prepared and crimped per manufacturer's guidelines, and the proper orientation of the valve is confirmed on the [employer] delivery system. The valve was advanced through the introducer sheath using normal technique until in an appropriate position in the abdominal aorta beyond the sheath tip. The balloon was then retracted and using the fine-tuning wheel was centered on the valve. The valve was then advanced across the aortic arch using appropriate flexion of the catheter. The valve was carefully positioned across the aortic valve annulus. The Commander catheter was retracted using normal technique. Once final position of the valve has been confirmed by angiographic assessment, the valve is deployed while temporarily holding ventilation and during rapid ventricular pacing to maintain systolic blood pressure < 50 mmHg and pulse pressure < 10 mmHg. The balloon inflation is held for >3 seconds after reaching full deployment volume. Once the balloon has fully deflated the balloon is retracted into the ascending aorta and valve function is assessed using echocardiography. There is felt to be no paravalvular leak and no central aortic insufficiency.  The patient's hemodynamic recovery following valve deployment is good.  The deployment balloon and guidewire are both removed.    PROCEDURE COMPLETION:   The sheath was removed and femoral artery closure performed.  Protamine was administered once femoral arterial repair was complete. The temporary pacemaker, pigtail catheters and femoral sheaths were removed with manual pressure used for hemostasis.  A Mynx femoral closure device was utilized following removal of the diagnostic sheath in the right femoral artery.  The  patient tolerated the procedure well and is transported to the surgical  intensive care in stable condition. There were no immediate intraoperative complications. All sponge instrument and needle counts are verified correct at completion of the operation.   No blood products were administered during the operation.  The patient received a total of 40 mL of intravenous contrast during the procedure.   Rexene Alberts, MD 03/03/2021 9:17 AM

## 2021-03-03 NOTE — CV Procedure (Signed)
HEART AND VASCULAR CENTER  TAVR OPERATIVE NOTE   Date of Procedure:  03/03/2021  Preoperative Diagnosis: Severe Aortic Stenosis   Postoperative Diagnosis: Same   Procedure:   Transcatheter Aortic Valve Replacement - Transfemoral Approach  Edwards Sapien 3 THV (size 26 mm, model # [id number], serial # [id number])   Co-Surgeons:  Lauree Chandler, MD and Valentina Gu. Roxy Manns, MD  Anesthesiologist:  Kalman Shan  Echocardiographer:  Johnsie Cancel  Pre-operative Echo Findings: Severe aortic stenosis Normal left ventricular systolic function  Post-operative Echo Findings: No paravalvular leak Normal left ventricular systolic function  BRIEF CLINICAL NOTE AND INDICATIONS FOR SURGERY  87 yo male with history of HTN, paroxysmal atrial fibrillation, CAD s/p 3V CABG, complete heart block s/p permanent pacemaker placement in 2019, ischemic cardiomyopathy, chronic systolic CHF, GERD, hiatal hernia, prior CVA and aortic stenosis who is here today for TAVR. He is known to have CAD and underwent 3V CABG in 2008. Most recent cardiac cath March 2022 with severe three vessel CAD with all three patent grafts. Echo April 2022 with LVEF=45-50%. Mild mitral regurgitation. The aortic valve is thickened and calcified with limited leaflet excursion. Mean gradient 21.9 mmHg, peak gradient 41.46mHg, AVA 0.98 cm2, dimensionless index 0.24. This is consistent with low flow, low gradient severe aortic stenosis. He has a permanent pacemaker in place. He has PAF and is on Eliquis.  During the course of the patient's preoperative work up they have been evaluated comprehensively by a multidisciplinary team [facility] specialists [facility].  They have been demonstrated to suffer from symptomatic severe aortic stenosis as noted above. The patient has been counseled extensively as to the relative risks and benefits of all options for the treatment of  severe aortic stenosis including long term medical therapy, conventional surgery for aortic valve replacement, and transcatheter aortic valve replacement.  The patient has been independently evaluated by Dr. ORoxy Mannswith CT surgery and they are felt to be at high risk for conventional surgical aortic valve replacement. The surgeon indicated the patient would be a poor candidate for conventional surgery. Based upon review of all of the patient's preoperative diagnostic tests they are felt to be candidate for transcatheter aortic valve replacement using the transfemoral approach as an alternative to high risk conventional surgery.    Following the decision to proceed with transcatheter aortic valve replacement, a discussion has been held regarding what types of management strategies would be attempted intraoperatively in the event of life-threatening complications, including whether or not the patient would be considered a candidate for the use of cardiopulmonary bypass and/or conversion to open sternotomy for attempted surgical intervention.  The patient has been advised of a variety of complications that might develop peculiar to this approach including but not limited to risks of death, stroke, paravalvular leak, aortic dissection or other major vascular complications, aortic annulus rupture, device embolization, cardiac rupture or perforation, acute myocardial infarction, arrhythmia, heart block or bradycardia requiring permanent pacemaker placement, congestive heart failure, respiratory failure, renal failure, pneumonia, infection, other late complications related to structural valve deterioration or migration, or other complications that might ultimately cause a temporary or permanent loss of functional independence or other long term morbidity.  The patient provides full informed consent for the procedure as described and all questions were answered preoperatively.    DETAILS OF THE OPERATIVE  PROCEDURE  PREPARATION:   The patient is brought to the operating room on the above mentioned date and central  monitoring was established by the anesthesia team including placement of a radial arterial line. The patient is placed in the supine position on the operating table.  Intravenous antibiotics are administered. Conscious sedation is used.   Baseline transthoracic echocardiogram was performed. The patient's chest, abdomen, both groins, and both lower extremities are prepared and draped in a sterile manner. A time out procedure is performed.   PERIPHERAL ACCESS:   Using the modified Seldinger technique, femoral arterial and venous access were obtained with placement of a 6 Fr sheath in the artery and a 7 Fr sheath in the vein on the right side using u/s guidance.  A pigtail diagnostic catheter was passed through the femoral arterial sheath under fluoroscopic guidance into the aortic root.  A temporary transvenous pacemaker catheter was passed through the femoral venous sheath under fluoroscopic guidance into the right ventricle.  The pacemaker was tested to ensure stable lead placement and pacemaker capture. Aortic root angiography was performed in order to determine the optimal angiographic angle for valve deployment.  TRANSFEMORAL ACCESS:  A micropuncture kit was used to gain access to the left femoral artery using u/s guidance. Position confirmed with angiography. Pre-closure with double ProGlide closure devices. The patient was heparinized systemically and ACT verified > 250 seconds.    A 14 Fr transfemoral E-sheath was introduced into the left femoral artery after progressively dilating over an Amplatz superstiff wire. An AL-2 catheter was used to direct a straight-tip exchange length wire across the native aortic valve into the left ventricle. This was exchanged out for a pigtail catheter and position was confirmed in the LV apex. The pigtail catheter was then exchanged for an Amplatz  Extra-stiff wire in the LV apex.   TRANSCATHETER HEART VALVE DEPLOYMENT:  An Edwards Sapien 3 THV (size 26 mm) was prepared and crimped per manufacturer's guidelines, and the proper orientation of the valve is confirmed on the [employer] delivery system. The valve was advanced through the introducer sheath using normal technique until in an appropriate position in the abdominal aorta beyond the sheath tip. The balloon was then retracted and using the fine-tuning wheel was centered on the valve. The valve was then advanced across the aortic arch using appropriate flexion of the catheter. The valve was carefully positioned across the aortic valve annulus. The Commander catheter was retracted using normal technique. Once final position of the valve has been confirmed by angiographic assessment, the valve is deployed while temporarily holding ventilation and during rapid ventricular pacing to maintain systolic blood pressure < 50 mmHg and pulse pressure < 10 mmHg. The balloon inflation is held for >3 seconds after reaching full deployment volume. Once the balloon has fully deflated the balloon is retracted into the ascending aorta and valve function is assessed using TTE. There is felt to be no paravalvular leak and no central aortic insufficiency.  The patient's hemodynamic recovery following valve deployment is good.  The deployment balloon and guidewire are both removed. Echo demostrated acceptable post-procedural gradients, stable mitral valve function, and no AI.   PROCEDURE COMPLETION:  The sheath was then removed and closure devices were completed. Protamine was administered once femoral arterial repair was complete. The temporary pacemaker, pigtail catheters and femoral sheaths were removed with a Mynx closure device placed in the artery and manual pressure used for venous hemostasis.    The patient tolerated the procedure well and is transported to the surgical intensive care in stable condition.  There were no immediate intraoperative complications. All sponge instrument  and needle counts are verified correct at completion of the operation.   No blood products were administered during the operation.  The patient received a total of 15 mL of intravenous contrast during the procedure.  Lauree Chandler MD 03/03/2021 9:20 AM

## 2021-03-03 NOTE — Anesthesia Preprocedure Evaluation (Signed)
Anesthesia Evaluation  Patient identified by MRN, date of birth, ID band Patient awake    Reviewed: Allergy & Precautions, NPO status , Patient's Chart, lab work & pertinent test results  Airway Mallampati: II  TM Distance: >3 FB Neck ROM: Full    Dental no notable dental hx.    Pulmonary COPD,    Pulmonary exam normal breath sounds clear to auscultation       Cardiovascular hypertension, + CAD, + Past MI, + CABG and +CHF  + pacemaker + Valvular Problems/Murmurs AS  Rhythm:Regular Rate:Normal + Systolic murmurs Left ventricular ejection fraction, by estimation, is 45 to 50%. The  left ventricle has mildly decreased function. The left ventricle  demonstrates regional wall motion abnormalities (see scoring  diagram/findings for description). Left ventricular  diastolic parameters are consistent with Grade II diastolic dysfunction  (pseudonormalization). Elevated left atrial pressure. There is moderate  hypokinesis of the left ventricular, basal inferior wall and inferolateral  wall.  2. Right ventricular systolic function is normal. The right ventricular  size is normal. There is normal pulmonary artery systolic pressure. The  estimated right ventricular systolic pressure is 29.2 mmHg.  3. Left atrial size was moderately dilated.  4. Right atrial size was moderately dilated.  5. The mitral valve is normal in structure. Mild mitral valve  regurgitation.  6. The aortic valve is tricuspid. There is severe calcifcation of the  aortic valve. There is severe thickening of the aortic valve. Aortic valve  regurgitation is not visualized. Moderate to severe aortic valve stenosis.  7. The inferior vena cava is normal in size with greater than 50%  respiratory variability, suggesting right atrial pressure of 3 mmHg.   Comparison(s): A prior study was performed on 10/01/2017. Prior images  reviewed side by side. The left ventricular  function is worsened. The left  ventricular diastolic function is significantly worse. The left  ventricular wall motion abnormality is  worse. Aortic stenosis has worsened.   FINDINGS  Left Ventricle: Left ventricular ejection fraction, by estimation, is 45  to 50%. The left ventricle has mildly decreased function. The left  ventricle demonstrates regional wall motion abnormalities. Moderate  hypokinesis of the left ventricular, basal  inferior wall and inferolateral wall. 3D left ventricular ejection  fraction analysis performed but not reported based on interpreter  judgement due to suboptimal quality. The left ventricular internal cavity  size was normal in size. There is no left  ventricular hypertrophy. Left ventricular diastolic parameters are  consistent with Grade II diastolic dysfunction (pseudonormalization).  Elevated left atrial pressure.   Right Ventricle: The right ventricular size is normal. No increase in  right ventricular wall thickness. Right ventricular systolic function is  normal. There is normal pulmonary artery systolic pressure. The tricuspid  regurgitant velocity is 2.64 m/s, and  with an assumed right atrial pressure of 3 mmHg, the estimated right  ventricular systolic pressure is 44.6 mmHg.   Left Atrium: Left atrial size was moderately dilated.   Right Atrium: Right atrial size was moderately dilated.   Pericardium: There is no evidence of pericardial effusion.   Mitral Valve: The mitral valve is normal in structure. Mild mitral annular  calcification. Mild mitral valve regurgitation, with centrally-directed  jet. MV peak gradient, 7.4 mmHg. The mean mitral valve gradient is 2.0  mmHg.   Tricuspid Valve: The tricuspid valve is normal in structure. Tricuspid  valve regurgitation is trivial.   Aortic Valve: The aortic valve is tricuspid. There is severe calcifcation  of the  aortic valve. There is severe thickening of the aortic valve.  Aortic  valve regurgitation is not visualized. Moderate to severe aortic  stenosis is present. Aortic valve mean  gradient measures 21.9 mmHg. Aortic valve peak gradient measures 41.5  mmHg. Aortic valve area, by VTI measures 1.09 cm.    Neuro/Psych CVA negative psych ROS   GI/Hepatic Neg liver ROS, GERD  ,  Endo/Other  diabetes  Renal/GU negative Renal ROS  negative genitourinary   Musculoskeletal negative musculoskeletal ROS (+)   Abdominal   Peds negative pediatric ROS (+)  Hematology negative hematology ROS (+)   Anesthesia Other Findings   Reproductive/Obstetrics negative OB ROS                             Anesthesia Physical Anesthesia Plan  ASA: 4  Anesthesia Plan: MAC   Post-op Pain Management:    Induction: Intravenous  PONV Risk Score and Plan: 1 and Propofol infusion  Airway Management Planned: Simple Face Mask  Additional Equipment: Arterial line  Intra-op Plan:   Post-operative Plan:   Informed Consent: I have reviewed the patients History and Physical, chart, labs and discussed the procedure including the risks, benefits and alternatives for the proposed anesthesia with the patient or authorized representative who has indicated his/her understanding and acceptance.     Dental advisory given  Plan Discussed with: CRNA and Surgeon  Anesthesia Plan Comments:         Anesthesia Quick Evaluation

## 2021-03-03 NOTE — Anesthesia Postprocedure Evaluation (Signed)
Anesthesia Post Note  Patient: Blake Burgess  Procedure(s) Performed: TRANSCATHETER AORTIC VALVE REPLACEMENT, TRANSFEMORAL (Bilateral: Groin) INTRAOPERATIVE TRANSTHORACIC ECHOCARDIOGRAM (Left: Chest) ULTRASOUND GUIDANCE FOR VASCULAR ACCESS (Bilateral: Groin)     Patient location during evaluation: PACU Anesthesia Type: MAC Level of consciousness: awake and alert Pain management: pain level controlled Vital Signs Assessment: post-procedure vital signs reviewed and stable Respiratory status: spontaneous breathing, nonlabored ventilation, respiratory function stable and patient connected to nasal cannula oxygen Cardiovascular status: stable and blood pressure returned to baseline Postop Assessment: no apparent nausea or vomiting Anesthetic complications: no   No notable events documented.  Last Vitals:  Vitals:   03/03/21 0922 03/03/21 0927  BP:    Pulse: (!) 0 (!) 0  Resp:    Temp:    SpO2:      Last Pain:  Vitals:   03/03/21 0920  TempSrc: Temporal  PainSc: 0-No pain                 Lezley Bedgood S

## 2021-03-03 NOTE — Anesthesia Procedure Notes (Signed)
Arterial Line Insertion Start/End7/07/2021 7:00 AM, 03/03/2021 7:10 AM Performed by: Myrtie Soman, MD, Janace Litten, CRNA, CRNA  Patient location: Pre-op. Lidocaine 1% used for infiltration Left, radial was placed Catheter size: 20 G Hand hygiene performed  and maximum sterile barriers used   Attempts: 2 Procedure performed without using ultrasound guided technique. Following insertion, Biopatch and dressing applied. Post procedure assessment: normal  Post procedure complications: unsuccessful attempts. Patient tolerated the procedure well with no immediate complications.

## 2021-03-04 ENCOUNTER — Encounter (HOSPITAL_COMMUNITY): Payer: Self-pay | Admitting: Cardiovascular Disease

## 2021-03-04 ENCOUNTER — Inpatient Hospital Stay (HOSPITAL_COMMUNITY): Payer: Medicare HMO

## 2021-03-04 DIAGNOSIS — Z952 Presence of prosthetic heart valve: Secondary | ICD-10-CM

## 2021-03-04 DIAGNOSIS — I350 Nonrheumatic aortic (valve) stenosis: Principal | ICD-10-CM

## 2021-03-04 LAB — BASIC METABOLIC PANEL
Anion gap: 7 (ref 5–15)
BUN: 23 mg/dL (ref 8–23)
CO2: 24 mmol/L (ref 22–32)
Calcium: 8.6 mg/dL — ABNORMAL LOW (ref 8.9–10.3)
Chloride: 110 mmol/L (ref 98–111)
Creatinine, Ser: 1.14 mg/dL (ref 0.61–1.24)
GFR, Estimated: >60 mL/min (ref >60)
Glucose, Bld: 126 mg/dL — ABNORMAL HIGH (ref 70–99)
Potassium: 3.8 mmol/L (ref 3.5–5.1)
Sodium: 141 mmol/L (ref 135–145)

## 2021-03-04 LAB — ECHOCARDIOGRAM COMPLETE
AR max vel: 2.77 cm2
AV Area VTI: 2.47 cm2
AV Area mean vel: 2.4 cm2
AV Mean grad: 10 mmHg
AV Peak grad: 15.2 mmHg
Ao pk vel: 1.95 m/s
Area-P 1/2: 3.42 cm2
Height: 69 in
S' Lateral: 3.6 cm
Weight: 2631.41 oz

## 2021-03-04 LAB — CBC
HCT: 30.5 % — ABNORMAL LOW (ref 39.0–52.0)
Hemoglobin: 10 g/dL — ABNORMAL LOW (ref 13.0–17.0)
MCH: 31.8 pg (ref 26.0–34.0)
MCHC: 32.8 g/dL (ref 30.0–36.0)
MCV: 97.1 fL (ref 80.0–100.0)
Platelets: 192 10*3/uL (ref 150–400)
RBC: 3.14 MIL/uL — ABNORMAL LOW (ref 4.22–5.81)
RDW: 15 % (ref 11.5–15.5)
WBC: 8 10*3/uL (ref 4.0–10.5)
nRBC: 0 % (ref 0.0–0.2)

## 2021-03-04 LAB — MAGNESIUM: Magnesium: 2 mg/dL (ref 1.7–2.4)

## 2021-03-04 MED ORDER — ASPIRIN 81 MG PO CHEW: 81.0000 mg | CHEWABLE_TABLET | Freq: Every day | ORAL | 2 refills | Status: DC

## 2021-03-04 NOTE — Progress Notes (Signed)
Progress Note  Patient Name: Blake Burgess Date of Encounter: 03/04/2021  [facility] Cardiologist: Minus Breeding, MD   Subjective   Pt feeling well this am. No pain or dyspnea  Inpatient Medications    Scheduled Meds:  aspirin  81 mg Oral Daily   clopidogrel  75 mg Oral Q breakfast   donepezil  10 mg Oral QHS   fluticasone furoate-vilanterol  1 puff Inhalation Daily   And   umeclidinium bromide  1 puff Inhalation Daily   pantoprazole  40 mg Oral Daily   sodium chloride flush  3 mL Intravenous Q12H   tamsulosin  0.4 mg Oral q AM   Continuous Infusions:  sodium chloride     sodium chloride     nitroGLYCERIN     phenylephrine (NEO-SYNEPHRINE) Adult infusion     PRN Meds: sodium chloride, acetaminophen **OR** acetaminophen, albuterol, metoprolol tartrate, morphine injection, nitroGLYCERIN, ondansetron (ZOFRAN) IV, oxyCODONE, sodium chloride flush, traMADol   Vital Signs    Vitals:   03/04/21 0000 03/04/21 0353 03/04/21 0804 03/04/21 0808  BP: (!) 142/62 (!) 148/63 (!) 151/54   Pulse: (!) 49 87 64 88  Resp: 18 19 (!) 21 (!) 22  Temp:  97.8 F (36.6 C) 97.9 F (36.6 C)   TempSrc:  Oral Oral   SpO2: 97% 99% 98% 97%  Weight:  74.6 kg    Height:        Intake/Output Summary (Last 24 hours) at 03/04/2021 1102 Last data filed at 03/04/2021 0927 Gross per 24 hour  Intake 1445.32 ml  Output 1699 ml  Net -253.68 ml   Last 3 Weights 03/04/2021 03/03/2021 02/27/2021  Weight (lbs) 164 lb 7.4 oz 159 lb 8 oz 159 lb 8 oz  Weight (kg) 74.6 kg 72.349 kg 72.349 kg      Telemetry    Sinus, paced beats - Personally Reviewed  ECG    V paced, PVCs- Personally Reviewed  Physical Exam   GEN: No acute distress.   Neck: No JVD Cardiac: RRR, no murmurs, rubs, or gallops.  Respiratory: Clear to auscultation bilaterally. GI: Soft, nontender, non-distended  MS: No edema; No deformity. Neuro:  Nonfocal  Psych: Normal affect   Labs    High Sensitivity Troponin:  No  results for input(s): TROPONINIHS in the last 720 hours.    Chemistry Recent Labs  Lab 02/27/21 0945 03/03/21 0744 03/03/21 0945 03/04/21 0219  NA 137 141 141 141  K 4.5 4.4 4.1 3.8  CL 107 108 108 110  CO2 20*  --   --  24  GLUCOSE 125* 124* 135* 126*  BUN 31* 34* 32* 23  CREATININE 1.38* 1.40* 1.40* 1.14  CALCIUM 8.9  --   --  8.6*  PROT 6.1*  --   --   --   ALBUMIN 3.6  --   --   --   AST 20  --   --   --   ALT 15  --   --   --   ALKPHOS 51  --   --   --   BILITOT 0.7  --   --   --   GFRNONAA 50*  --   --  >60  ANIONGAP 10  --   --  7     Hematology Recent Labs  Lab 02/27/21 0945 03/03/21 0744 03/03/21 0945 03/04/21 0219  WBC 7.2  --   --  8.0  RBC 3.58*  --   --  3.14*  HGB 11.3* 10.2* 8.8* 10.0*  HCT 35.6* 30.0* 26.0* 30.5*  MCV 99.4  --   --  97.1  MCH 31.6  --   --  31.8  MCHC 31.7  --   --  32.8  RDW 15.3  --   --  15.0  PLT 297  --   --  192    BNPNo results for input(s): BNP, PROBNP in the last 168 hours.   DDimer No results for input(s): DDIMER in the last 168 hours.   Radiology    DG Chest Port 1 View  Result Date: 03/03/2021 CLINICAL DATA:  Atelectasis EXAM: PORTABLE CHEST 1 VIEW COMPARISON:  Portable exam 1054 hours compared to 02/27/2021 FINDINGS: LEFT subclavian ICD with leads projecting over RIGHT atrium and RIGHT ventricle. Normal heart size post CABG and interval TAVR. Mediastinal contours and pulmonary vascularity normal. Atherosclerotic calcification aorta. Minimal LEFT basilar atelectasis. Lungs otherwise clear. No infiltrate, pleural effusion, or pneumothorax. Bones demineralized with RIGHT shoulder prosthesis noted. IMPRESSION: Minimal LEFT basilar atelectasis. Interval TAVR Aortic Atherosclerosis (ICD10-I70.0). Electronically Signed   By: Lavonia Dana M.D.   On: 03/03/2021 11:17   ECHOCARDIOGRAM LIMITED  Result Date: 03/03/2021    ECHOCARDIOGRAM LIMITED REPORT   Patient Name:   Blake Burgess Creedon Date of Exam: 03/03/2021 Medical Rec #:   [id number]     Height:       69.0 in Accession #:    [id number]    Weight:       159.5 lb Date of Birth:  10-09-1931     BSA:          1.877 m Patient Age:    87 years      BP:           188/65 mmHg Patient Gender: M             HR:           64 bpm. Exam Location:  Inpatient Procedure: Limited Echo, Cardiac Doppler and Color Doppler Indications:    Aortic stenosis I35.0  History:        Patient has prior history of Echocardiogram examinations, most                 recent 12/10/2020. CHF, CAD, COPD and Stroke, Aortic Valve                 Disease, Signs/Symptoms:Shortness of Breath; Risk                 Factors:Non-Smoker and Hypertension.                 Aortic Valve: 26 mm stented (TAVR) valve is present in the                 aortic position. Procedure Date: 03/03/2021.  Sonographer:    Vickie Epley RDCS Referring Phys: [place]  1. Left ventricular ejection fraction, by estimation, is 50 to 55%. The left ventricle has low normal function. The left ventricle demonstrates regional wall motion abnormalities (see scoring diagram/findings for description).  2. Pacing wires in RA / RV.  3. Left atrial size was moderately dilated.  4. Right atrial size was moderately dilated.  5. No change in trivial effusion post deployment . The pericardial effusion is lateral to the left ventricle.  6. The mitral valve is abnormal. Mild mitral valve regurgitation. Moderate mitral annular calcification.  7. Pre TAVR: calcified tri leaflet valve severe AS. Supine in OR  mean gradient 22 peak 39 mmHg AVA 0.76 cm2         Post TAVR: well placed 26 mm Sapien 3 valve no PVL mean gradient 3 peak 7 mmHg AVA 2.27 cm2 . The aortic valve has been repaired/replaced. There is a 26 mm stented (TAVR) valve present in the aortic position. Procedure Date: 03/03/2021. FINDINGS  Left Ventricle: Left ventricular ejection fraction, by estimation, is 50 to 55%. The left ventricle has low normal function. The left ventricle  demonstrates regional wall motion abnormalities. Right Ventricle: Pacing wires in RA / RV. Left Atrium: Left atrial size was moderately dilated. Right Atrium: Right atrial size was moderately dilated. Pericardium: No change in trivial effusion post deployment. Trivial pericardial effusion is present. The pericardial effusion is lateral to the left ventricle. Mitral Valve: The mitral valve is abnormal. There is mild thickening of the mitral valve leaflet(s). There is mild calcification of the mitral valve leaflet(s). Moderate mitral annular calcification. Mild mitral valve regurgitation. Aortic Valve: Pre TAVR: calcified tri leaflet valve severe AS. Supine in OR mean gradient 22 peak 39 mmHg AVA 0.76 cm2 Post TAVR: well placed 26 mm Sapien 3 valve no PVL mean gradient 3 peak 7 mmHg AVA 2.27 cm2. The aortic valve has been repaired/replaced. Aortic valve mean gradient measures 3.0 mmHg. Aortic valve peak gradient measures 6.6 mmHg. Aortic valve area, by VTI measures 2.28 cm. There is a 26 mm stented (TAVR) valve present in the aortic position. Procedure Date: 03/03/2021. IAS/Shunts: The interatrial septum was not assessed. Additional Comments: A device lead is visualized. LEFT VENTRICLE PLAX 2D LVOT diam:     2.00 cm LV SV:         68 LV SV Index:   36 LVOT Area:     3.14 cm  AORTIC VALVE AV Area (Vmax):    1.97 cm AV Area (Vmean):   2.41 cm AV Area (VTI):     2.28 cm AV Vmax:           128.00 cm/s AV Vmean:          76.000 cm/s AV VTI:            0.301 m AV Peak Grad:      6.6 mmHg AV Mean Grad:      3.0 mmHg LVOT Vmax:         80.30 cm/s LVOT Vmean:        58.300 cm/s LVOT VTI:          0.218 m LVOT/AV VTI ratio: 0.72  SHUNTS Systemic VTI:  0.22 m Systemic Diam: 2.00 cm Jenkins Rouge MD Electronically signed by Jenkins Rouge MD Signature Date/Time: 03/03/2021/9:34:04 AM    Final    Structural Heart Procedure  Result Date: 03/03/2021 See surgical note for result.   Cardiac Studies     Patient Profile      87 y.o. male with severe AS, now s/p TAVR  Assessment & Plan    Severe aortic stenosis: He is now one day post TAVR with placement of an Edwards Sapien 3 valve from the transfemoral approach. Doing well today. Echo reviewed by. Valve is working well with no leak. BP stable. Groins soft. Will d/c home today on ASA and Plavix   For questions or updates, please contact [place] Please consult [email or web address] for contact info under        Signed, Lauree Chandler, MD  03/04/2021, 11:02 AM

## 2021-03-04 NOTE — Discharge Summary (Addendum)
Discharge Summary    Patient ID: Blake Burgess MRN: [id number]; DOB: 1932/08/12  Admit date: 03/03/2021 Discharge date: 03/04/2021  PCP:  Raelene Bott, MD   [facility] Providers Cardiologist:  Minus Breeding, MD   {    Discharge Diagnoses    Principal Problem:   S/P TAVR (transcatheter aortic valve replacement) Active Problems:   Aortic stenosis, severe  Diagnostic Studies/Procedures    TAVR 03/03/21:    Date of Procedure:                03/03/2021   Preoperative Diagnosis:      Severe Aortic Stenosis   Postoperative Diagnosis:    Same   Procedure:        Transcatheter Aortic Valve Replacement - Transfemoral Approach             Edwards Sapien 3 THV (size 26 mm, model # [id number], serial # [id number])              Co-Surgeons:                        Lauree Chandler, MD and Valentina Gu. Roxy Manns, MD   Anesthesiologist:                  Kalman Shan   Echocardiographer:              Johnsie Cancel   Pre-operative Echo Findings: Severe aortic stenosis Normal left ventricular systolic function   Post-operative Echo Findings: No paravalvular leak Normal left ventricular systolic function   BRIEF CLINICAL NOTE AND INDICATIONS FOR SURGERY   87 yo male with history of HTN, paroxysmal atrial fibrillation, CAD s/p 3V CABG, complete heart block s/p permanent pacemaker placement in 2019, ischemic cardiomyopathy, chronic systolic CHF, GERD, hiatal hernia, prior CVA and aortic stenosis who is here today for TAVR. He is known to have CAD and underwent 3V CABG in 2008. Most recent cardiac cath March 2022 with severe three vessel CAD with all three patent grafts. Echo April 2022 with LVEF=45-50%. Mild mitral regurgitation. The aortic valve is thickened and calcified with limited leaflet excursion. Mean gradient 21.9 mmHg, peak gradient 41.18mHg, AVA 0.98 cm2, dimensionless index 0.24. This is consistent with low flow, low gradient severe aortic stenosis. He has a permanent pacemaker in place. He  has PAF and is on Eliquis.   During the course of the patient's preoperative work up they have been evaluated comprehensively by a multidisciplinary team of specialists [facility].  They have been demonstrated to suffer from symptomatic severe aortic stenosis as noted above. The patient has been counseled extensively as to the relative risks and benefits of all options for the treatment of severe aortic stenosis including long term medical therapy, conventional surgery for aortic valve replacement, and transcatheter aortic valve replacement.  The patient has been independently evaluated by Dr. ORoxy Mannswith CT surgery and they are felt to be at high risk for conventional surgical aortic valve replacement. The surgeon indicated the patient would be a poor candidate for conventional surgery. Based upon review of all of the patient's preoperative diagnostic tests they are felt to be candidate for transcatheter aortic valve replacement using the transfemoral approach as an alternative to high risk conventional surgery.     Following the decision to proceed with transcatheter aortic valve replacement, a discussion has been held regarding what types of management strategies would be attempted intraoperatively  in the event of life-threatening complications, including whether or not the patient would be considered a candidate for the use of cardiopulmonary bypass and/or conversion to open sternotomy for attempted surgical intervention.  The patient has been advised of a variety of complications that might develop peculiar to this approach including but not limited to risks of death, stroke, paravalvular leak, aortic dissection or other major vascular complications, aortic annulus rupture, device embolization, cardiac rupture or perforation, acute myocardial infarction, arrhythmia, heart block or bradycardia requiring permanent  pacemaker placement, congestive heart failure, respiratory failure, renal failure, pneumonia, infection, other late complications related to structural valve deterioration or migration, or other complications that might ultimately cause a temporary or permanent loss of functional independence or other long term morbidity.  The patient provides full informed consent for the procedure as described and all questions were answered preoperatively.       DETAILS OF THE OPERATIVE PROCEDURE   PREPARATION:   The patient is brought to the operating room on the above mentioned date and central monitoring was established by the anesthesia team including placement of a radial arterial line. The patient is placed in the supine position on the operating table.  Intravenous antibiotics are administered. Conscious sedation is used.   Baseline transthoracic echocardiogram was performed. The patient's chest, abdomen, both groins, and both lower extremities are prepared and draped in a sterile manner. A time out procedure is performed.     PERIPHERAL ACCESS:   Using the modified Seldinger technique, femoral arterial and venous access were obtained with placement of a 6 Fr sheath in the artery and a 7 Fr sheath in the vein on the right side using u/s guidance.  A pigtail diagnostic catheter was passed through the femoral arterial sheath under fluoroscopic guidance into the aortic root.  A temporary transvenous pacemaker catheter was passed through the femoral venous sheath under fluoroscopic guidance into the right ventricle.  The pacemaker was tested to ensure stable lead placement and pacemaker capture. Aortic root angiography was performed in order to determine the optimal angiographic angle for valve deployment.   TRANSFEMORAL ACCESS: A micropuncture kit was used to gain access to the left femoral artery using u/s guidance. Position confirmed with angiography. Pre-closure with double ProGlide closure devices. The  patient was heparinized systemically and ACT verified > 250 seconds.     A 14 Fr transfemoral E-sheath was introduced into the left femoral artery after progressively dilating over an Amplatz superstiff wire. An AL-2 catheter was used to direct a straight-tip exchange length wire across the native aortic valve into the left ventricle. This was exchanged out for a pigtail catheter and position was confirmed in the LV apex. The pigtail catheter was then exchanged for an Amplatz Extra-stiff wire in the LV apex.   TRANSCATHETER HEART VALVE DEPLOYMENT:  An Edwards Sapien 3 THV (size 26 mm) was prepared and crimped per manufacturer's guidelines, and the proper orientation of the valve is confirmed on the [employer] delivery system. The valve was advanced through the introducer sheath using normal technique until in an appropriate position in the abdominal aorta beyond the sheath tip. The balloon was then retracted and using the fine-tuning wheel was centered on the valve. The valve was then advanced across the aortic arch using appropriate flexion of the catheter. The valve was carefully positioned across the aortic valve annulus. The Commander catheter was retracted using normal technique. Once final position of the valve has been confirmed by angiographic assessment, the valve is deployed  while temporarily holding ventilation and during rapid ventricular pacing to maintain systolic blood pressure < 50 mmHg and pulse pressure < 10 mmHg. The balloon inflation is held for >3 seconds after reaching full deployment volume. Once the balloon has fully deflated the balloon is retracted into the ascending aorta and valve function is assessed using TTE. There is felt to be no paravalvular leak and no central aortic insufficiency.  The patient's hemodynamic recovery following valve deployment is good.  The deployment balloon and guidewire are both removed. Echo demostrated acceptable post-procedural gradients, stable  mitral valve function, and no AI.   PROCEDURE COMPLETION:  The sheath was then removed and closure devices were completed. Protamine was administered once femoral arterial repair was complete. The temporary pacemaker, pigtail catheters and femoral sheaths were removed with a Mynx closure device placed in the artery and manual pressure used for venous hemostasis.     The patient tolerated the procedure well and is transported to the surgical intensive care in stable condition. There were no immediate intraoperative complications. All sponge instrument and needle counts are verified correct at completion of the operation.   No blood products were administered during the operation.   The patient received a total of 15 mL of intravenous contrast during the procedure.  Post procedure echocardiogram 03/04/21:  Results pending however reviewed by Dr. Angelena Form    History of Present Illness     Blake Burgess is a 87 y.o. male with history of multivessel CAD s/p CABG in the remote past, aortic stenosis, HTN, COPD, PAF on long-term anticoagulation using Eliquis, CHB s/p PPM in 2019, previous stroke, GERD, and hiatal hernia who was referred for surgical consultation to discuss treatment options for management of aortic stenosis.  Mr. Bozard underwent CABG in 2008 and has been followed for many years by Dr. Percival Spanish. Patient has reported chronic exertional shortness of breath that has progressed over the years. He now gets short of breath with very low level activity.  Previous echocardiograms have documented the presence of moderately decreased left ventricular systolic function with moderate aortic stenosis.  he patient underwent diagnostic cardiac catheterization in 10/2020   which revealed severe native coronary artery disease but continued patency of all bypass grafts involving all 3 vascular territories. Follow-up TEE  performed 11/2020 revealed some progression in the severity of the patient's aortic  stenosis.  Left ventricular ejection fraction was estimated 45 to 50%.  The aortic valve was trileaflet with severe thickening and restricted leaflet mobility involving all 3 leaflets.  Peak velocity across aortic valve measured 3.2 m/s corresponding to mean transvalvular gradient estimated 21.9 mmHg and aortic valve area calculated 1.09 cm by VTI.  The DVI was notably 0.24 and stroke-volume index 47.  He was seen in consultation previously by Dr. Angelena Form at which time a CT angiography was performed and the patient was referred for surgical consultation. He was evaluated recently by Dr. 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  He is a non- smoker.   He was ultimately not felt to be a conventional surgical candidate therefore the plan was to proceed with TAVR on 03/03/21.    Hospital Course     He underwent TAVR for severe aortic stenosis with placement of Edwards Sapien 3 valve from the transfemoral approach. Groin site is stable with no signs of hematoma or bleeding. Echocardiogram performed today with well functioning aortic valve with no leak. Follow up  appointments have been made.   EP was asked to see the patient for device interrogation and intermittent HRs in the 40's. Per their note, patient has known atrial lead issues (senses OK) with eventual plans for lead revision. On their telemetry review, he was in Sinus Brady/NSR in 50-70s with occasional RV (HIS bundle) pacing. 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 Suspect sinus node was stunned post op resulting in him intermittently pacing at his lower limit. No new recommendations were made at this time.    Follow up with Dr. Lovena Le was made to discuss atrial lead revision now that he is post TAVR. The above information was reviewed with Dr. Curt Bears.  Medication plan: ASA and Eliquis   Consultants: EP   The  patient has been seen and examined by Dr. Angelena Form who feels that the patient is stable and ready for discharge today, 03/04/21. The patient was seen by cardiac rehab and has deferred CRII. ___________  Discharge Vitals Blood pressure (!) 154/37, pulse 62, temperature 98.5 F (36.9 C), temperature source Oral, resp. rate 16, height 5' 9"  (1.753 m), weight 74.6 kg, SpO2 98 %.  Filed Weights   03/03/21 0602 03/04/21 0353  Weight: 72.3 kg 74.6 kg    Labs & Radiologic Studies    CBC Recent Labs    03/03/21 0945 03/04/21 0219  WBC  --  8.0  HGB 8.8* 10.0*  HCT 26.0* 30.5*  MCV  --  97.1  PLT  --  160   Basic Metabolic Panel Recent Labs    03/03/21 0945 03/04/21 0219  NA 141 141  K 4.1 3.8  CL 108 110  CO2  --  24  GLUCOSE 135* 126*  BUN 32* 23  CREATININE 1.40* 1.14  CALCIUM  --  8.6*  MG  --  2.0   Liver Function Tests No results for input(s): AST, ALT, ALKPHOS, BILITOT, PROT, ALBUMIN in the last 72 hours. No results for input(s): LIPASE, AMYLASE in the last 72 hours. High Sensitivity Troponin:   No results for input(s): TROPONINIHS in the last 720 hours.  BNP Invalid input(s): POCBNP D-Dimer No results for input(s): DDIMER in the last 72 hours. Hemoglobin A1C No results for input(s): HGBA1C in the last 72 hours. Fasting Lipid Panel No results for input(s): CHOL, HDL, LDLCALC, TRIG, CHOLHDL, LDLDIRECT in the last 72 hours. Thyroid Function Tests No results for input(s): TSH, T4TOTAL, T3FREE, THYROIDAB in the last 72 hours.  Invalid input(s): FREET3 _____________  DG Chest 2 View  Result Date: 02/27/2021 CLINICAL DATA:  Preop for cardiac surgery. EXAM: CHEST - 2 VIEW COMPARISON:  February 06, 2021. FINDINGS: The heart size and mediastinal contours are within normal limits. Both lungs are clear. Status post coronary bypass graft. Left-sided pacemaker is in good position. The visualized skeletal structures are unremarkable. IMPRESSION: No active cardiopulmonary disease.  Aortic Atherosclerosis (ICD10-I70.0). Electronically Signed   By: Marijo Conception M.D.   On: 02/27/2021 15:19   CT CORONARY MORPH W/CTA COR W/SCORE W/CA W/CM &/OR WO/CM  Addendum Date: 02/13/2021   ADDENDUM REPORT: 02/13/2021 12:50 CLINICAL DATA:  Aortic stenosis EXAM: Cardiac TAVR CT TECHNIQUE: The patient was scanned on a Siemens Force 109 slice scanner. A 120 kV retrospective scan was triggered in the descending thoracic aorta at 111 HU's. Gantry rotation speed was 270 msecs and collimation was .9 mm. No beta blockade or nitro were given. The 3D data set was reconstructed in 5% intervals  of the R-R cycle. Systolic and diastolic phases were analyzed on a dedicated work station using MPR, MIP and VRT modes. The patient received 80 cc of contrast. FINDINGS: Aortic Valve: Tri leaflet AV Calcified with restricted leaflet motion Calcium Score 2164 Aorta: No aneurysm. Normal arch vessels Moderate calcific atherosclerosis Sinotubular Junction: 27 mm Ascending Thoracic Aorta: 29 mm Aortic Arch: 27 mm Descending Thoracic Aorta: 25 mm Sinus of Valsalva Measurements: Non-coronary: 35.9 mm Right - coronary: 35.1 mm Left - coronary: 36.7 mm Coronary Artery Height above Annulus: Left Main: 14,25 mm above annulus Right Coronary: 14.24 mm above annulus Virtual Basal Annulus Measurements: Maximum/Minimum Diameter: 29.12 mm x 21.94 mm Perimeter: 84 mm Area: 530 mm 2 Coronary Arteries: Grafts: Patent SVG to OM, Patent SVG to PDA, Patent LIMA to LAD Optimum Fluoroscopic Angle for Delivery: LAO 7 Caudal 12 degrees IMPRESSION: 1. Calcified Tri leaflet AV with score 2164 2.  Normal aortic root 2.9 cm moderate calcific atherosclerosis 3.  Pacing wires noted in RA/RV 4.  Patient SVG PDA, SVG OM, Patent LIMA to LAD 5. Annular area 530 mm 2 with calcium noted at base of left cusp suitable for a 26 mm Sapien 3 valve 6. Coronary arteries sufficient height above annulus for deployment and patent grafts see above 7. Optimum angiographic  angle for deployment LAO 7 Caudal 12 degrees [place] Electronically Signed   By: Jenkins Rouge M.D.   On: 02/13/2021 12:50   Result Date: 02/13/2021 EXAM: OVER-READ INTERPRETATION  CT CHEST The following report is an over-read performed by radiologist Dr. Vinnie Langton of [facility], [place] on 02/13/2021. This over-read does not include interpretation of cardiac or coronary anatomy or pathology. The coronary calcium score/coronary CTA interpretation by the cardiologist is attached. COMPARISON:  No priors. FINDINGS: Extracardiac findings will be described separately under dictation for contemporaneously obtained CTA chest, abdomen and pelvis. IMPRESSION: Please see separate dictation for contemporaneously obtained CTA chest, abdomen and pelvis dated 02/13/2021 for full description of relevant extracardiac findings. Electronically Signed: By: Vinnie Langton M.D. On: 02/13/2021 11:51   DG Chest Port 1 View  Result Date: 03/03/2021 CLINICAL DATA:  Atelectasis EXAM: PORTABLE CHEST 1 VIEW COMPARISON:  Portable exam 1054 hours compared to 02/27/2021 FINDINGS: LEFT subclavian ICD with leads projecting over RIGHT atrium and RIGHT ventricle. Normal heart size post CABG and interval TAVR. Mediastinal contours and pulmonary vascularity normal. Atherosclerotic calcification aorta. Minimal LEFT basilar atelectasis. Lungs otherwise clear. No infiltrate, pleural effusion, or pneumothorax. Bones demineralized with RIGHT shoulder prosthesis noted. IMPRESSION: Minimal LEFT basilar atelectasis. Interval TAVR Aortic Atherosclerosis (ICD10-I70.0). Electronically Signed   By: Lavonia Dana M.D.   On: 03/03/2021 11:17   CT ANGIO CHEST AORTA W/CM & OR WO/CM  Result Date: 02/13/2021 CLINICAL DATA:  87 year old male with history of severe aortic stenosis. Preprocedural study prior to potential transcatheter aortic valve replacement (TAVR) procedure. EXAM: CT ANGIOGRAPHY CHEST, ABDOMEN AND PELVIS TECHNIQUE: Multidetector  CT imaging through the chest, abdomen and pelvis was performed using the standard protocol during bolus administration of intravenous contrast. Multiplanar reconstructed images and MIPs were obtained and reviewed to evaluate the vascular anatomy. CONTRAST:  137m OMNIPAQUE IOHEXOL 350 MG/ML SOLN COMPARISON:  Chest CTA 07/10/2020.  In. FINDINGS: CTA CHEST FINDINGS Cardiovascular: Heart size is mildly enlarged with left atrial dilatation. There is no significant pericardial fluid, thickening or pericardial calcification. There is aortic atherosclerosis, as well as atherosclerosis of the great vessels of the mediastinum and the coronary arteries, including calcified atherosclerotic plaque in the  left main, left anterior descending, left circumflex and right coronary arteries. Status post median sternotomy for CABG including LIMA to the LAD. Severe thickening and calcification of the aortic valve. Calcifications of the mitral annulus. Mediastinum/Lymph Nodes: No pathologically enlarged mediastinal or hilar lymph nodes. Please note that accurate exclusion of hilar adenopathy is limited on noncontrast CT scans. Esophagus is unremarkable in appearance. No axillary lymphadenopathy. Lungs/Pleura: 2 mm right upper lobe pulmonary nodule (axial image 37 of series 4), stable compared to the prior examination, likely benign. No other larger more suspicious appearing pulmonary nodules or masses are noted. No acute consolidative airspace disease. No pleural effusions. Musculoskeletal/Soft Tissues: Median sternotomy wires. There are no aggressive appearing lytic or blastic lesions noted in the visualized portions of the skeleton. CTA ABDOMEN AND PELVIS FINDINGS Hepatobiliary: No suspicious cystic or solid hepatic lesions. No intra or extrahepatic biliary ductal dilatation. Status post cholecystectomy. Pancreas: No pancreatic mass. No pancreatic ductal dilatation. No pancreatic or peripancreatic fluid collections or inflammatory  changes. Spleen: Unremarkable. Adrenals/Urinary Tract: Multifocal cortical scarring in the kidneys bilaterally (right greater than left). No suspicious renal lesions. No hydroureteronephrosis. Bilateral adrenal glands are normal in appearance. Urinary bladder is partially obscured by beam hardening artifact from the patient's left hip arthroplasty. Visualized portions of the urinary bladder are unremarkable. Stomach/Bowel: The appearance of the stomach is normal. No pathologic dilatation of small bowel or colon. Numerous colonic diverticulae are noted, without surrounding inflammatory changes to suggest an acute diverticulitis at this time. Normal appendix. Vascular/Lymphatic: Aortic atherosclerosis, without evidence of aneurysm or dissection in the abdominal or pelvic vasculature. Vascular findings and measurements pertinent to potential TAVR procedure, as detailed above. No lymphadenopathy noted in the abdomen or pelvis. Reproductive: Prostate gland and seminal vesicles are unremarkable in appearance. Right-sided hydrocele incompletely imaged. Other: No significant volume of ascites.  No pneumoperitoneum. Musculoskeletal: Status post PLIF from L3-L5 with interbody cage at the L4-L5 interspace. There are no aggressive appearing lytic or blastic lesions noted in the visualized portions of the skeleton. Status post left hip arthroplasty. VASCULAR MEASUREMENTS PERTINENT TO TAVR: AORTA: Minimal Aortic Diameter-13 x 13 mm Severity of Aortic Calcification-severe RIGHT PELVIS: Right Common Iliac Artery - Minimal Diameter-7.1 x 7.1 mm Tortuosity-mild Calcification-moderate to severe Right External Iliac Artery - Minimal Diameter-8.4 x 8.6 mm Tortuosity-mild Calcification-mild Right Common Femoral Artery - Minimal Diameter-6.1 x 5.1 mm Tortuosity-mild Calcification-moderate to severe LEFT PELVIS: Left Common Iliac Artery - Minimal Diameter-7.4 x 4.8 mm Tortuosity-mild Calcification-moderate to severe Left External Iliac  Artery - Minimal Diameter-6.9 x 8.7 mm Tortuosity-mild Calcification-mild Left Common Femoral Artery - Minimal Diameter-1.4 x 1.4 mm Tortuosity-mild Calcification-moderate to severe Review of the MIP images confirms the above findings. IMPRESSION: 1. Vascular findings and measurements pertinent to potential TAVR procedure, as detailed above. 2. Severe thickening calcification of the aortic valve, compatible with reported clinical history of severe aortic stenosis. 3. Cardiomegaly with left atrial dilatation. 4. Aortic atherosclerosis, in addition to left main and 3 vessel coronary artery disease. Status post median sternotomy for CABG including LIMA to the LAD. 5. 2 mm right upper lobe pulmonary nodule, stable compared to the prior study, nonspecific, but statistically likely benign. 6. Colonic diverticulosis without evidence of acute diverticulitis at this time. 7. Additional incidental findings, as above. Electronically Signed   By: Vinnie Langton M.D.   On: 02/13/2021 14:09   VAS US CAROTID  Result Date: 02/13/2021 Carotid Arterial Duplex Study Patient Name:  Blake Burgess  Date of Exam:   02/13/2021 Medical  Rec #: [id number]      Accession #:    [id number] Date of Birth: [birth year]      Patient Gender: M Patient Age:   88Y Exam Location:  [facility] Procedure:      VAS US CAROTID Referring Phys: [place] --------------------------------------------------------------------------------  Indications:   Pre-surgical evaluation. Risk Factors:  Hypertension, coronary artery disease. Other Factors: Aortic stenosis. Performing Technologist: Maudry Mayhew MHA, RDMS, RVT, RDCS  Examination Guidelines: A complete evaluation includes B-mode imaging, spectral Doppler, color Doppler, and power Doppler as needed of all accessible portions of each vessel. Bilateral testing is considered an integral part of a complete examination. Limited examinations for reoccurring indications may be  performed as noted.  Right Carotid Findings: +----------+-------+-------+--------+---------------------------------+--------+           PSV    EDV    StenosisPlaque Description               Comments           cm/s   cm/s                                                     +----------+-------+-------+--------+---------------------------------+--------+ CCA Prox  84     6                                                        +----------+-------+-------+--------+---------------------------------+--------+ CCA Distal58     12             smooth and heterogenous                   +----------+-------+-------+--------+---------------------------------+--------+ ICA Prox  132    17     1-39%   calcific                                  +----------+-------+-------+--------+---------------------------------+--------+ ICA Distal114    22                                                       +----------+-------+-------+--------+---------------------------------+--------+ ECA       424    29     >50%    heterogenous, irregular and                                               calcific                                  +----------+-------+-------+--------+---------------------------------+--------+ +----------+--------+-------+----------------+-------------------+           PSV cm/sEDV cmsDescribe        Arm Pressure (mmHG) +----------+--------+-------+----------------+-------------------+ [id number]             Multiphasic, WNL                    +----------+--------+-------+----------------+-------------------+ +---------+--------+--+--------+-+---------+  VertebralPSV cm/s37EDV cm/s7Antegrade +---------+--------+--+--------+-+---------+  Left Carotid Findings: +----------+-------+-------+--------+---------------------------------+--------+           PSV    EDV    StenosisPlaque Description               Comments           cm/s   cm/s                                                      +----------+-------+-------+--------+---------------------------------+--------+ CCA Prox  79     16             smooth and heterogenous                   +----------+-------+-------+--------+---------------------------------+--------+ CCA Distal84     15             irregular and heterogenous                +----------+-------+-------+--------+---------------------------------+--------+ ICA Prox  112    29     1-39%   heterogenous, irregular and                                               calcific                                  +----------+-------+-------+--------+---------------------------------+--------+ ICA Distal93     17                                                       +----------+-------+-------+--------+---------------------------------+--------+ ECA       294    17             calcific, heterogenous and                                                irregular                                 +----------+-------+-------+--------+---------------------------------+--------+ +----------+--------+--------+----------------+-------------------+           PSV cm/sEDV cm/sDescribe        Arm Pressure (mmHG) +----------+--------+--------+----------------+-------------------+ [id number]             Multiphasic, WNL                    +----------+--------+--------+----------------+-------------------+ +---------+--------+--+--------+--+---------+ VertebralPSV cm/s56EDV cm/s13Antegrade +---------+--------+--+--------+--+---------+   Summary: Right Carotid: Velocities in the right ICA are consistent with a 1-39% stenosis. Left Carotid: Velocities in the left ICA are consistent with a 1-39% stenosis. Vertebrals:  Bilateral vertebral arteries demonstrate antegrade flow. Subclavians: Normal flow hemodynamics were seen in bilateral subclavian              arteries. *See table(s) above for  measurements and observations.  Electronically signed by Erlene Quan  Donzetta Matters MD on 02/13/2021 at 2:47:01 PM.    Final    ECHOCARDIOGRAM LIMITED  Result Date: 03/03/2021    ECHOCARDIOGRAM LIMITED REPORT   Patient Name:   Blake Burgess Date of Exam: 03/03/2021 Medical Rec #:  [id number]     Height:       69.0 in Accession #:    [id number]    Weight:       159.5 lb Date of Birth:  July 11, 1932     BSA:          1.877 m Patient Age:    80 years      BP:           188/65 mmHg Patient Gender: M             HR:           64 bpm. Exam Location:  Inpatient Procedure: Limited Echo, Cardiac Doppler and Color Doppler Indications:    Aortic stenosis I35.0  History:        Patient has prior history of Echocardiogram examinations, most                 recent 12/10/2020. CHF, CAD, COPD and Stroke, Aortic Valve                 Disease, Signs/Symptoms:Shortness of Breath; Risk                 Factors:Non-Smoker and Hypertension.                 Aortic Valve: 26 mm stented (TAVR) valve is present in the                 aortic position. Procedure Date: 03/03/2021.  Sonographer:    Vickie Epley RDCS Referring Phys: [place]  1. Left ventricular ejection fraction, by estimation, is 50 to 55%. The left ventricle has low normal function. The left ventricle demonstrates regional wall motion abnormalities (see scoring diagram/findings for description).  2. Pacing wires in RA / RV.  3. Left atrial size was moderately dilated.  4. Right atrial size was moderately dilated.  5. No change in trivial effusion post deployment . The pericardial effusion is lateral to the left ventricle.  6. The mitral valve is abnormal. Mild mitral valve regurgitation. Moderate mitral annular calcification.  7. Pre TAVR: calcified tri leaflet valve severe AS. Supine in OR mean gradient 22 peak 39 mmHg AVA 0.76 cm2         Post TAVR: well placed 26 mm Sapien 3 valve no PVL mean gradient 3 peak 7 mmHg AVA 2.27 cm2 . The aortic valve has been  repaired/replaced. There is a 26 mm stented (TAVR) valve present in the aortic position. Procedure Date: 03/03/2021. FINDINGS  Left Ventricle: Left ventricular ejection fraction, by estimation, is 50 to 55%. The left ventricle has low normal function. The left ventricle demonstrates regional wall motion abnormalities. Right Ventricle: Pacing wires in RA / RV. Left Atrium: Left atrial size was moderately dilated. Right Atrium: Right atrial size was moderately dilated. Pericardium: No change in trivial effusion post deployment. Trivial pericardial effusion is present. The pericardial effusion is lateral to the left ventricle. Mitral Valve: The mitral valve is abnormal. There is mild thickening of the mitral valve leaflet(s). There is mild calcification of the mitral valve leaflet(s). Moderate mitral annular calcification. Mild mitral valve regurgitation. Aortic Valve: Pre TAVR: calcified tri leaflet valve severe AS. Supine in OR mean gradient  22 peak 39 mmHg AVA 0.76 cm2 Post TAVR: well placed 26 mm Sapien 3 valve no PVL mean gradient 3 peak 7 mmHg AVA 2.27 cm2. The aortic valve has been repaired/replaced. Aortic valve mean gradient measures 3.0 mmHg. Aortic valve peak gradient measures 6.6 mmHg. Aortic valve area, by VTI measures 2.28 cm. There is a 26 mm stented (TAVR) valve present in the aortic position. Procedure Date: 03/03/2021. IAS/Shunts: The interatrial septum was not assessed. Additional Comments: A device lead is visualized. LEFT VENTRICLE PLAX 2D LVOT diam:     2.00 cm LV SV:         68 LV SV Index:   36 LVOT Area:     3.14 cm  AORTIC VALVE AV Area (Vmax):    1.97 cm AV Area (Vmean):   2.41 cm AV Area (VTI):     2.28 cm AV Vmax:           128.00 cm/s AV Vmean:          76.000 cm/s AV VTI:            0.301 m AV Peak Grad:      6.6 mmHg AV Mean Grad:      3.0 mmHg LVOT Vmax:         80.30 cm/s LVOT Vmean:        58.300 cm/s LVOT VTI:          0.218 m LVOT/AV VTI ratio: 0.72  SHUNTS Systemic VTI:  0.22  m Systemic Diam: 2.00 cm Jenkins Rouge MD Electronically signed by Jenkins Rouge MD Signature Date/Time: 03/03/2021/9:34:04 AM    Final    Structural Heart Procedure  Result Date: 03/03/2021 See surgical note for result.  CT ANGIO ABDOMEN PELVIS  W &/OR WO CONTRAST  Result Date: 02/13/2021 CLINICAL DATA:  87 year old male with history of severe aortic stenosis. Preprocedural study prior to potential transcatheter aortic valve replacement (TAVR) procedure. EXAM: CT ANGIOGRAPHY CHEST, ABDOMEN AND PELVIS TECHNIQUE: Multidetector CT imaging through the chest, abdomen and pelvis was performed using the standard protocol during bolus administration of intravenous contrast. Multiplanar reconstructed images and MIPs were obtained and reviewed to evaluate the vascular anatomy. CONTRAST:  151m OMNIPAQUE IOHEXOL 350 MG/ML SOLN COMPARISON:  Chest CTA 07/10/2020.  In. FINDINGS: CTA CHEST FINDINGS Cardiovascular: Heart size is mildly enlarged with left atrial dilatation. There is no significant pericardial fluid, thickening or pericardial calcification. There is aortic atherosclerosis, as well as atherosclerosis of the great vessels of the mediastinum and the coronary arteries, including calcified atherosclerotic plaque in the left main, left anterior descending, left circumflex and right coronary arteries. Status post median sternotomy for CABG including LIMA to the LAD. Severe thickening and calcification of the aortic valve. Calcifications of the mitral annulus. Mediastinum/Lymph Nodes: No pathologically enlarged mediastinal or hilar lymph nodes. Please note that accurate exclusion of hilar adenopathy is limited on noncontrast CT scans. Esophagus is unremarkable in appearance. No axillary lymphadenopathy. Lungs/Pleura: 2 mm right upper lobe pulmonary nodule (axial image 37 of series 4), stable compared to the prior examination, likely benign. No other larger more suspicious appearing pulmonary nodules or masses are  noted. No acute consolidative airspace disease. No pleural effusions. Musculoskeletal/Soft Tissues: Median sternotomy wires. There are no aggressive appearing lytic or blastic lesions noted in the visualized portions of the skeleton. CTA ABDOMEN AND PELVIS FINDINGS Hepatobiliary: No suspicious cystic or solid hepatic lesions. No intra or extrahepatic biliary ductal dilatation. Status post cholecystectomy. Pancreas: No pancreatic mass. No pancreatic ductal  dilatation. No pancreatic or peripancreatic fluid collections or inflammatory changes. Spleen: Unremarkable. Adrenals/Urinary Tract: Multifocal cortical scarring in the kidneys bilaterally (right greater than left). No suspicious renal lesions. No hydroureteronephrosis. Bilateral adrenal glands are normal in appearance. Urinary bladder is partially obscured by beam hardening artifact from the patient's left hip arthroplasty. Visualized portions of the urinary bladder are unremarkable. Stomach/Bowel: The appearance of the stomach is normal. No pathologic dilatation of small bowel or colon. Numerous colonic diverticulae are noted, without surrounding inflammatory changes to suggest an acute diverticulitis at this time. Normal appendix. Vascular/Lymphatic: Aortic atherosclerosis, without evidence of aneurysm or dissection in the abdominal or pelvic vasculature. Vascular findings and measurements pertinent to potential TAVR procedure, as detailed above. No lymphadenopathy noted in the abdomen or pelvis. Reproductive: Prostate gland and seminal vesicles are unremarkable in appearance. Right-sided hydrocele incompletely imaged. Other: No significant volume of ascites.  No pneumoperitoneum. Musculoskeletal: Status post PLIF from L3-L5 with interbody cage at the L4-L5 interspace. There are no aggressive appearing lytic or blastic lesions noted in the visualized portions of the skeleton. Status post left hip arthroplasty. VASCULAR MEASUREMENTS PERTINENT TO TAVR: AORTA:  Minimal Aortic Diameter-13 x 13 mm Severity of Aortic Calcification-severe RIGHT PELVIS: Right Common Iliac Artery - Minimal Diameter-7.1 x 7.1 mm Tortuosity-mild Calcification-moderate to severe Right External Iliac Artery - Minimal Diameter-8.4 x 8.6 mm Tortuosity-mild Calcification-mild Right Common Femoral Artery - Minimal Diameter-6.1 x 5.1 mm Tortuosity-mild Calcification-moderate to severe LEFT PELVIS: Left Common Iliac Artery - Minimal Diameter-7.4 x 4.8 mm Tortuosity-mild Calcification-moderate to severe Left External Iliac Artery - Minimal Diameter-6.9 x 8.7 mm Tortuosity-mild Calcification-mild Left Common Femoral Artery - Minimal Diameter-1.4 x 1.4 mm Tortuosity-mild Calcification-moderate to severe Review of the MIP images confirms the above findings. IMPRESSION: 1. Vascular findings and measurements pertinent to potential TAVR procedure, as detailed above. 2. Severe thickening calcification of the aortic valve, compatible with reported clinical history of severe aortic stenosis. 3. Cardiomegaly with left atrial dilatation. 4. Aortic atherosclerosis, in addition to left main and 3 vessel coronary artery disease. Status post median sternotomy for CABG including LIMA to the LAD. 5. 2 mm right upper lobe pulmonary nodule, stable compared to the prior study, nonspecific, but statistically likely benign. 6. Colonic diverticulosis without evidence of acute diverticulitis at this time. 7. Additional incidental findings, as above. Electronically Signed   By: Vinnie Langton M.D.   On: 02/13/2021 14:09   Disposition   Pt is being discharged home today in good condition.  Follow-up Plans & Appointments     Follow-up Information     Eileen Stanford, PA-C Follow up on 03/18/2021.   Specialties: Cardiology, Radiology Why: at 2:30 Contact information: [address] [place] [id number] [phone]         Evans Lance, MD Follow up on 04/03/2021.   Specialty:  Cardiology Why: at 3pm Contact information: [place]. [address] [place] [address] [phone]                Discharge Instructions     Call MD for:  difficulty breathing, headache or visual disturbances   Complete by: As directed    Call MD for:  extreme fatigue   Complete by: As directed    Call MD for:  hives   Complete by: As directed    Call MD for:  persistant dizziness or light-headedness   Complete by: As directed    Call MD for:  persistant nausea and vomiting  Complete by: As directed    Call MD for:  redness, tenderness, or signs of infection (pain, swelling, redness, odor or green/yellow discharge around incision site)   Complete by: As directed    Call MD for:  severe uncontrolled pain   Complete by: As directed    Call MD for:  temperature >100.4   Complete by: As directed    Diet - low sodium heart healthy   Complete by: As directed    Discharge instructions   Complete by: As directed    ACTIVITY AND EXERCISE  Daily activity and exercise are an important part of your recovery. People recover at different rates depending on their general health and type of valve procedure.  Most people recovering from TAVR feel better relatively quickly   No lifting, pushing, pulling more than 10 pounds (examples to avoid: groceries, vacuuming, gardening, golfing):             - For one week with a procedure through the groin.             - For six weeks for procedures through the chest wall or neck. NOTE: You will typically see one of our providers 7-14 days after your procedure to discuss [place] the above activities.      DRIVING  Do not drive until you are seen for follow up and cleared by a provider. Generally, we ask patient to not drive for 1 week after their procedure.  If you have been told by your doctor in the past that you may not drive, you must talk with him/her before you begin driving again.   DRESSING  Groin site: you may  leave the clear dressing over the site for up to one week or until it falls off.   HYGIENE  If you had a femoral (leg) procedure, you may take a shower when you return home. After the shower, pat the site dry. Do NOT use powder, oils or lotions in your groin area until the site has completely healed.  If you had a chest procedure, you may shower when you return home unless specifically instructed not to by your discharging practitioner.             - DO NOT scrub incision; pat dry with a towel.             - DO NOT apply any lotions, oils, powders to the incision.             - No tub baths / swimming for at least 2 weeks.  If you notice any fevers, chills, increased pain, swelling, bleeding or pus, please contact your doctor.   ADDITIONAL INFORMATION  If you are going to have an upcoming dental procedure, please contact our office as you will require antibiotics ahead of time to prevent infection on your heart valve.    If you have any questions or concerns you can call the structural heart phone during normal business hours 8am-4pm. If you have an urgent need after hours or weekends please call [phone] to talk to the on call provider for general cardiology. If you have an emergency that requires immediate attention, please call 911.    After TAVR Checklist  Check  Test Description  Follow up appointment in 1-2 weeks  You will see our structural heart physician assistant, Nell Range. Your incision sites will be checked and you will be cleared to drive and resume all normal activities if you are doing  well.    1 month echo and follow up  You will have an echo to check on your new heart valve and be seen back in the office by Nell Range. Many times the echo is not read by your appointment time, but Joellen Jersey will call you later that day or the following day to report your results.  Follow up with your primary cardiologist You will need to be seen by your primary cardiologist in the  following 3-6 months after your 1 month appointment in the valve clinic. Often times your Plavix or Aspirin will be discontinued during this time, but this is decided on a case by case basis.   1 year echo and follow up You will have another echo to check on your heart valve after 1 year and be seen back in the office by Nell Range. This your last structural heart visit.  Bacterial endocarditis prophylaxis  You will have to take antibiotics for the rest of your life before all dental procedures (even teeth cleanings) to protect your heart valve. Antibiotics are also required before some surgeries. Please check with your cardiologist before scheduling any surgeries. Also, please make sure to tell us if you have a penicillin allergy as you will require an alternative antibiotic.   If the dressing is still on your incision site when you go home, remove it on the third day after your surgery date. Remove dressing if it begins to fall off, or if it is dirty or damaged before the third day.   Complete by: As directed    Increase activity slowly   Complete by: As directed        Discharge Medications   Allergies as of 03/04/2021   No Known Allergies      Medication List     TAKE these medications    acetaminophen 500 MG tablet Commonly known as: TYLENOL Take 1,000 mg by mouth every 6 (six) hours as needed for moderate pain.   albuterol (2.5 MG/3ML) 0.083% nebulizer solution Commonly known as: PROVENTIL Take 3 mLs (2.5 mg total) by nebulization every 6 (six) hours as needed for wheezing or shortness of breath.   amLODipine 2.5 MG tablet Commonly known as: NORVASC Take 2.5 mg by mouth in the morning.   apixaban 5 MG Tabs tablet Commonly known as: ELIQUIS Take 1 tablet (5 mg total) by mouth 2 (two) times daily.   aspirin 81 MG chewable tablet Chew 1 tablet (81 mg total) by mouth daily. Start taking on: March 05, 2021   atorvastatin 40 MG tablet Commonly known as: LIPITOR Take 40  mg by mouth in the morning.   cyanocobalamin 1000 MCG/ML injection Commonly known as: (VITAMIN B-12) Inject 1,000 mcg into the muscle every 30 (thirty) days.   donepezil 10 MG tablet Commonly known as: ARICEPT Take 10 mg by mouth at bedtime.   enalapril 20 MG tablet Commonly known as: VASOTEC Take 20 mg by mouth 2 (two) times daily.   furosemide 40 MG tablet Commonly known as: LASIX Take 40 mg by mouth in the morning.   Lidocaine 4 % Ptch Apply 1 patch topically daily as needed (pain).   omeprazole 20 MG capsule Commonly known as: PRILOSEC Take 20 mg by mouth in the morning.   tamsulosin 0.4 MG Caps capsule Commonly known as: FLOMAX Take 0.4 mg by mouth in the morning.   Trelegy Ellipta 100-62.5-25 MCG/INH Aepb Generic drug: Fluticasone-Umeclidin-Vilant Inhale 1 puff into the lungs daily. Rinse mouth after  ASK your doctor about these medications    isosorbide mononitrate 60 MG 24 hr tablet Commonly known as: IMDUR Take 1 tablet (60 mg total) by mouth daily.   metoprolol succinate 50 MG 24 hr tablet Commonly known as: TOPROL-XL Take 1 tablet (50 mg total) by mouth daily.   nitroGLYCERIN 0.4 MG SL tablet Commonly known as: NITROSTAT Place 1 tablet (0.4 mg total) under the tongue every 5 (five) minutes as needed for chest pain.               Discharge Care Instructions  (From admission, onward)           Start     Ordered   03/04/21 0000  If the dressing is still on your incision site when you go home, remove it on the third day after your surgery date. Remove dressing if it begins to fall off, or if it is dirty or damaged before the third day.        03/04/21 1248            Outstanding Labs/Studies   None   Duration of Discharge Encounter   Greater than 30 minutes including physician time.  Signed, Kathyrn Drown, NP 03/04/2021, 12:48 PM  I have personally seen and examined this patient. I agree with the assessment and plan as  outlined above.  See my full note from this am. His valve is working well. BP stable. Groins stable. D/C home today on ASA and resume Eliquis tonight.   Lauree Chandler 03/04/2021 1:04 PM

## 2021-03-04 NOTE — Progress Notes (Signed)
  Echocardiogram 2D Echocardiogram has been performed.  Matilde Bash 03/04/2021, 11:22 AM

## 2021-03-04 NOTE — Progress Notes (Addendum)
   Asked to look at patients device with intermittent rates in 40s post op.   Pt with known atrial lead issues (senses OK) with eventual plans for lead revision.    Today he is Sinus Brady/NSR in 50-70s, with occasional RV (HIS bundle) pacing.   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 Suspect sinus node was stunned post op resulting in him intermittently pacing at his lower limit.   Would not recommend any changes to his device prior to discharge.    Made follow up with Dr. Lovena Le to discuss atrial lead revision now that he is post TAVR.   Above reviewed with Dr. Curt Bears.   Please call back with any questions.   Legrand Como [address]" [place], PA-C  03/04/2021 10:00 AM

## 2021-03-04 NOTE — Progress Notes (Signed)
D/C instructions given to patient and family. IV removed, clean and intact. Wound care and medications reviewed, all questions answered. Son to escort pt home.  Davonn Canterbury, RN

## 2021-03-04 NOTE — Progress Notes (Signed)
[id number] Pt stated he has walked this morning and is tired now. Pt not interested in CRP 2 referral. Left heart healthy diet if pt interested. 88 paced with PVCs, 154/37. For tentative d/c. Graylon Good RN BSN 03/04/2021 11:25 AM

## 2021-03-05 ENCOUNTER — Telehealth: Payer: Self-pay

## 2021-03-05 MED FILL — Magnesium Sulfate Inj 50%: INTRAMUSCULAR | Qty: 10 | Status: AC

## 2021-03-05 MED FILL — Potassium Chloride Inj 2 mEq/ML: INTRAVENOUS | Qty: 40 | Status: AC

## 2021-03-05 NOTE — Telephone Encounter (Signed)
I spoke with Rackley and he ate breakfast with the pt this morning and he said the pt was doing well at that time.  Just prior to my call he said his mother called and said the pt is tired and that he seems to have lost color in his face. Rackley is getting ready to go back to the pt's home to check on him.  I advised him to check the pt's groin site, BP, pulse and oxygen saturation and then call me back. Rackley agreed with plan.

## 2021-03-05 NOTE — Telephone Encounter (Signed)
Rackley went to check on the pt and he said the pt was sleeping.  He woke the pt up to check his vital signs but did not look at groin site.  O2 sat 95%, BP 97/43 and pulse 78.  Rackley was unsure if he did the BP correctly and plans to check this again later today. The pt has taken his morning medications already.  I made Rackley aware that if the BP is correct then it is low in comparison with the readings in his chart.  I advised him to make sure the pt remains well hydrated, if BP is low later today (SBP <100) then the pt should not take evening dose of enalapril.  I also reviewed the signs of potential complications at the groin site and Rackley will look at this site when he goes back to the pt's home. Rackley will contact me with any additional questions or concerns.     Rackley is aware of the pt's follow up with provider Nell Range PA-C on 03/18/2021 at 2:30 PM at [facility] office .

## 2021-03-17 NOTE — Progress Notes (Signed)
[facility] [place]                                     Cardiology Office Note:    Date:  03/18/2021   ID:  Blake Burgess, DOB [birth year], MRN [id number]  PCP:  Raelene Bott, MD  [facility] Cardiologist:  Minus Breeding, MD / Dr. Angelena Form & Dr. Roxy Manns (TAVR) [facility] Electrophysiologist:  None   Referring MD: Raelene Bott, MD   [facility] s/p TAVR  History of Present Illness:    Blake Burgess is a 87 y.o. male with a hx of multivessel CAD s/p CABG in the remote past, HTN, severe COPD (due to chicken farming), PAF on Eliquis, CHB s/ PPM in 2019, ICM with chronic systolic CHF, previous stroke, GERD, hiatal hernia and severe aortic stenosis s/p TAVR (03/03/21) who presents to clinic for follow up.   Patient's cardiac history dates back to 2008 when he underwent coronary artery bypass grafting by Dr. Darcey Nora.  He has been followed for many years by Dr. Percival Spanish.  Patient has chronic exertional shortness of breath that has progressed.  He now gets short of breath with very low level activity.  Previous echocardiograms have documented the presence of moderately decreased left ventricular systolic function with moderate aortic stenosis.  The patient underwent diagnostic cardiac catheterization in March 2022 which revealed severe native coronary artery disease but continued patency of all bypass grafts involving all 3 vascular territories.  Follow-up transthoracic echocardiogram performed December 10, 2020 revealed some progression in the severity of the patient's aortic stenosis.  Left ventricular ejection fraction was estimated 45 to 50%.  The aortic valve was trileaflet with severe thickening and restricted leaflet mobility involving all 3 leaflets.  Peak velocity across aortic valve measured 3.2 m/s corresponding to mean transvalvular gradient estimated 21.9 mmHg and aortic valve area calculated 1.09 cm by VTI.  The DVI was notably 0.24 and  stroke-volume index 47.  The patient has been seen in consultation previously by Dr. Angelena Form.  CT angiography was performed and the patient was referred for surgical consultation. He was evaluated recently by Dr. 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  The patient has never been a smoker.  She was evaluated by the multidisciplinary valve team and underwent a successful TAVR with a 26 mm Edwards Sapien 3 Ultra THV via the TF approach on 03/03/21. Post operative echo showed EF 50%, normally functioning TAVR with a mean gradient of 10 mmHg and no PVL. EP was asked to see the patient for device interrogation and intermittent HRs in the 40's. Per their note, patient has known atrial lead issues (senses OK) with eventual plans for lead revision. On their telemetry review, he was in Sinus Brady/NSR in 50-70s with occasional RV (HIS bundle) pacing. 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 Suspected that sinus node was stunned post op resulting in him intermittently pacing at his lower limit. No new recommendations were made and he has an apt with Dr. Lovena Le on 8/12 to discuss lead revision. He was discharged on Eliquis and the addition of a baby aspirin x 6 months.   Today the patient presents to clinic for follow up. Here with son.  Patient reports not being able to tell a  big difference since his TAVR.  However, her son says that he seems much less short of breath and has less episodes of panting after exertion.  He denies any chest pain.  He does still still have some shortness of breath.  No lower extremity edema, orthopnea or PND.  He does get some positional dizziness, mostly when this standing up from sitting.  No syncope.  No blood in his stool or urine.  Patient is most interested in getting back on his lawn mower.  Past Medical History:  Diagnosis Date   Aortic stenosis     mild AS 09/2017 echo   Cancer [facility])    skin   COPD (chronic obstructive pulmonary disease) (HCC)    Stage 3 per patient's son   Coronary artery disease    a.  s/p CABG;   b. cath 4/12: EF 55%, 3vCAD, patent L-LAD, patent S-RCA, patent S-CFX (done after a false pos. ETT)   Dementia [facility])    Per son   Diverticular disease    GERD (gastroesophageal reflux disease)    GI bleed    Hemorrhoids    HH (hiatus hernia)    History of kidney stones    Hypertension    Osteoarthritis    Other and unspecified hyperlipidemia    Presence of permanent cardiac pacemaker    Schatzki's ring    Stroke [facility])     Past Surgical History:  Procedure Laterality Date   ARTERIOVENOUS GRAFT PLACEMENT W/ ENDOSCOPIC VEIN HARVEST     of the right leg greater spahenous vein. Surgeon: Tharon Aquas Trigt,M.D.   BACK SURGERY  2017   COLONOSCOPY  02/24/2010   Hemorrhoids, Diverticulosis. Performed at [facility]. Normal terminal ileum. Dr. June Leap, [place] ARTERY BYPASS GRAFT  06/21/2007   CABG x 3 Surgeon Ivin Poot, MD   EYE SURGERY     bilateral cataract removal   hip replace  06/09/2004   left hip Surgeon Pietro Cassis. Alvan Dame, MD   INTRAOPERATIVE TRANSTHORACIC ECHOCARDIOGRAM Left 03/03/2021   Procedure: INTRAOPERATIVE TRANSTHORACIC ECHOCARDIOGRAM;  Surgeon: Burnell Blanks, MD;  Location: [place];  Service: Open Heart Surgery;  Laterality: Left;   LAPAROSCOPIC CHOLECYSTECTOMY  2021   LEFT HEART CATH AND CORS/GRAFTS ANGIOGRAPHY N/A 11/04/2020   Procedure: LEFT HEART CATH AND CORS/GRAFTS ANGIOGRAPHY;  Surgeon: Troy Sine, MD;  Location: [place] CV LAB;  Service: Cardiovascular;  Laterality: N/A;   MULTIPLE EXTRACTIONS WITH ALVEOLOPLASTY N/A 02/03/2021   Procedure: MULTIPLE EXTRACTION WITH ALVEOLOPLASTY;  Surgeon: Charlaine Dalton, DMD;  Location: [place];  Service: Dentistry;  Laterality: N/A;   PACEMAKER IMPLANT N/A 10/03/2017   Procedure: PACEMAKER IMPLANT;  Surgeon: Evans Lance, MD;   Location: [place] CV LAB;  Service: Cardiovascular;  Laterality: N/A;   REVERSE SHOULDER ARTHROPLASTY Right 08/25/2018   Procedure: REVERSE SHOULDER ARTHROPLASTY;  Surgeon: Netta Cedars, MD;  Location: [place];  Service: Orthopedics;  Laterality: Right;   TRANSCATHETER AORTIC VALVE REPLACEMENT, TRANSFEMORAL Bilateral 03/03/2021   Procedure: TRANSCATHETER AORTIC VALVE REPLACEMENT, TRANSFEMORAL;  Surgeon: Burnell Blanks, MD;  Location: [place];  Service: Open Heart Surgery;  Laterality: Bilateral;   ULTRASOUND GUIDANCE FOR VASCULAR ACCESS Bilateral 03/03/2021   Procedure: ULTRASOUND GUIDANCE FOR VASCULAR ACCESS;  Surgeon: Burnell Blanks, MD;  Location: [place];  Service: Open Heart Surgery;  Laterality: Bilateral;    Current Medications: Current Meds  Medication Sig   acetaminophen (TYLENOL) 500 MG tablet Take 1,000 mg by mouth every 6 (six) hours  as needed for moderate pain.   albuterol (PROVENTIL) (2.5 MG/3ML) 0.083% nebulizer solution Take 3 mLs (2.5 mg total) by nebulization every 6 (six) hours as needed for wheezing or shortness of breath.   amLODipine (NORVASC) 2.5 MG tablet Take 2.5 mg by mouth in the morning.   amoxicillin (AMOXIL) 500 MG tablet Take FOUR tablets (2000 mg) by mouth ONE HOUR before any dental procedures   apixaban (ELIQUIS) 5 MG TABS tablet Take 1 tablet (5 mg total) by mouth 2 (two) times daily.   aspirin 81 MG chewable tablet Chew 1 tablet (81 mg total) by mouth daily.   atorvastatin (LIPITOR) 40 MG tablet Take 40 mg by mouth in the morning.   cyanocobalamin (,VITAMIN B-12,) 1000 MCG/ML injection Inject 1,000 mcg into the muscle every 30 (thirty) days.   donepezil (ARICEPT) 10 MG tablet Take 10 mg by mouth at bedtime.   enalapril (VASOTEC) 20 MG tablet Take 20 mg by mouth 2 (two) times daily.   Fluticasone-Umeclidin-Vilant (TRELEGY ELLIPTA) 100-62.5-25 MCG/INH AEPB Inhale 1 puff into the lungs daily. Rinse mouth after   furosemide (LASIX) 40 MG tablet  Take 40 mg by mouth in the morning.   isosorbide mononitrate (IMDUR) 60 MG 24 hr tablet Take 1 tablet (60 mg total) by mouth daily.   Lidocaine 4 % PTCH Apply 1 patch topically daily as needed (pain).   metoprolol succinate (TOPROL-XL) 50 MG 24 hr tablet Take 1 tablet (50 mg total) by mouth daily.   nitroGLYCERIN (NITROSTAT) 0.4 MG SL tablet Place 1 tablet (0.4 mg total) under the tongue every 5 (five) minutes as needed for chest pain.   omeprazole (PRILOSEC) 20 MG capsule Take 20 mg by mouth in the morning.   tamsulosin (FLOMAX) 0.4 MG CAPS capsule Take 0.4 mg by mouth in the morning.     Allergies:   Patient has no known allergies.   Social History   Socioeconomic History   Marital status: Married    Spouse name: Not on file   Number of children: 2   Years of education: Not on file   Highest education level: Not on file  Occupational History   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 Daily Caffeine: 24 oz pepsi and 1 cup coffee.    Social Determinants of Health   Financial Resource Strain: Not on file  Food Insecurity: Not on file  Transportation Needs: Not on file  Physical Activity: Not on file  Stress: Not on file  Social Connections: Not on file     Family History: The patient's family history includes Diabetes in his brother, father, and sister; Heart attack in his mother; Hypertension in his mother.  ROS:   Please see the history of present illness.    All other systems reviewed and are negative.  EKGs/Labs/Other Studies Reviewed:    The following studies were reviewed today:  TAVR 03/03/21:     Date of Procedure:                03/03/2021   Preoperative Diagnosis:       Severe Aortic Stenosis   Postoperative Diagnosis:  Same   Procedure:        Transcatheter Aortic Valve Replacement - Transfemoral Approach             Edwards Sapien 3 THV (size 26 mm, model # [id number], serial # [id number])              Co-Surgeons:                        Lauree Chandler, MD and Valentina Gu. Roxy Manns, MD   Anesthesiologist:                  Kalman Shan   Echocardiographer:              Johnsie Cancel   Pre-operative Echo Findings: Severe aortic stenosis Normal left ventricular systolic function   Post-operative Echo Findings: No paravalvular leak Normal left ventricular systolic function   _________________________   Echo 03/04/21 IMPRESSIONS   1. Abnormal septal motion . Left ventricular ejection fraction, by  estimation, is 50 to 55%. The left ventricle has low normal function. The  left ventricle has no regional wall motion abnormalities. There is mild  left ventricular hypertrophy. Left  ventricular diastolic parameters are consistent with Grade I diastolic  dysfunction (impaired relaxation).   2. Pacing wires in RA/RV . Right ventricular systolic function is normal.  The right ventricular size is normal. There is normal pulmonary artery  systolic pressure.   3. Left atrial size was moderately dilated.   4. Right atrial size was moderately dilated.   5. The mitral valve is degenerative. Mild mitral valve regurgitation. No  evidence of mitral stenosis. Moderate mitral annular calcification.   6. Post TAVR 03/03/21 Well placed 26 mm Sapien 3 valve No PVL mean  gradient 10 peak 15 mmHg AVA 2.5 cm2. The aortic valve has been  repaired/replaced. Aortic valve regurgitation is not visualized. No aortic  stenosis is present. Procedure Date: 03/03/2021.   7. The inferior vena cava is normal in size with greater than 50%  respiratory variability, suggesting right atrial pressure of 3 mmHg.    EKG:  EKG is NOT ordered today.   Recent Labs: 02/27/2021: ALT 15 03/04/2021:  BUN 23; Creatinine, Ser 1.14; Hemoglobin 10.0; Magnesium 2.0; Platelets 192; Potassium 3.8; Sodium 141  Recent Lipid Panel    Component Value Date/Time   CHOL 112 10/01/2017 0619   TRIG 68 10/01/2017 0619   HDL 39 (L) 10/01/2017 0619   CHOLHDL 2.9 10/01/2017 0619   VLDL 14 10/01/2017 0619   LDLCALC 59 10/01/2017 0619     Risk Assessment/Calculations:    CHA2DS2-VASc Score = 7  This indicates a 11.2% annual risk of stroke. The patient's score is based upon: CHF History: Yes HTN History: Yes Diabetes History: No Stroke History: Yes Vascular Disease History: Yes Age Score: 2 Gender Score: 0    Physical Exam:    VS:  BP 120/60   Pulse (!) 59   Ht '5\' 9"'$  (1.753 m)   Wt 160 lb 12.8 oz (72.9 kg)   SpO2 98%   BMI 23.75 kg/m     Wt Readings from Last 3 Encounters:  03/18/21 160 lb 12.8 oz (72.9 kg)  03/04/21 164 lb 7.4 oz (74.6 kg)  02/27/21 159 lb 8 oz (72.3 kg)     GEN:  Well nourished, well developed in no acute distress HEENT: Normal NECK: No JVD;  LYMPHATICS: No lymphadenopathy CARDIAC: RRR, no murmurs, rubs, gallops RESPIRATORY:  Clear to auscultation without  rales, wheezing or rhonchi  ABDOMEN: Soft, non-tender, non-distended MUSCULOSKELETAL:  No edema; No deformity  SKIN: Warm and dry . Groin sites clear without hematoma or ecchymosis  NEUROLOGIC:  Alert and oriented x 3 PSYCHIATRIC:  Normal affect   ASSESSMENT:    1. S/P TAVR (transcatheter aortic valve replacement)   2. Cardiac pacemaker in situ   3. Coronary artery disease involving native coronary artery of native heart without angina pectoris   4. Essential hypertension   5. PAF (paroxysmal atrial fibrillation) (HCC)    PLAN:    In order of problems listed above:  Severe AS s/p TAVR: Doing well after his TAVR.  Groin sites healing well.  Continue on Eliquis and aspirin.SBE prophylaxis discussed; I have RX'd amoxicillin.  I will see him back next month for follow-up and echo.  S/p PPM: known  atrial lead issues with eventual plans for lead revision. He has an apt with Dr. Lovena Le on 8/12 to discuss lead revision.  CAD: pre TAVR cath showed continued patency of all bypass grafts.  Continue medical therapy  HTN: BP well controlled today.  No changes made  PAF: Continue on Eliquis 5 mg twice daily.  Medication Adjustments/Labs and Tests Ordered: Current medicines are reviewed at length with the patient today.  Concerns regarding medicines are outlined above.  No orders of the defined types were placed in this encounter.  Meds ordered this encounter  Medications   amoxicillin (AMOXIL) 500 MG tablet    Sig: Take FOUR tablets (2000 mg) by mouth ONE HOUR before any dental procedures    Dispense:  12 tablet    Refill:  3     Patient Instructions  Medication Instructions:  Your physician has recommended you make the following change in your medication:  1.) amoxicillin 500 mg tablets - take FOUR tablets (2000 mg) by mouth ONE HOUR BEFORE any dental procedures  *If you need a refill on your cardiac medications before your next appointment, please call your pharmacy*   Lab Work: none   Testing/Procedures: As planned   Follow-Up: As planned    Signed, Angelena Form, PA-C  03/18/2021 [place]

## 2021-03-18 ENCOUNTER — Encounter: Payer: Self-pay | Admitting: Physician Assistant

## 2021-03-18 ENCOUNTER — Other Ambulatory Visit: Payer: Self-pay

## 2021-03-18 ENCOUNTER — Ambulatory Visit: Payer: Medicare HMO | Admitting: Physician Assistant

## 2021-03-18 VITALS — BP 120/60 | HR 59 | Ht 69.0 in | Wt 160.8 lb

## 2021-03-18 DIAGNOSIS — I350 Nonrheumatic aortic (valve) stenosis: Secondary | ICD-10-CM | POA: Diagnosis not present

## 2021-03-18 DIAGNOSIS — Z952 Presence of prosthetic heart valve: Secondary | ICD-10-CM | POA: Diagnosis not present

## 2021-03-18 DIAGNOSIS — I2510 Atherosclerotic heart disease of native coronary artery without angina pectoris: Secondary | ICD-10-CM

## 2021-03-18 DIAGNOSIS — I10 Essential (primary) hypertension: Secondary | ICD-10-CM

## 2021-03-18 DIAGNOSIS — Z950 Presence of cardiac pacemaker: Secondary | ICD-10-CM

## 2021-03-18 DIAGNOSIS — I480 Paroxysmal atrial fibrillation: Secondary | ICD-10-CM

## 2021-03-18 MED ORDER — AMOXICILLIN 500 MG PO TABS: ORAL_TABLET | ORAL | 3 refills | Status: DC

## 2021-03-18 NOTE — Patient Instructions (Signed)
Medication Instructions:  Your physician has recommended you make the following change in your medication:  1.) amoxicillin 500 mg tablets - take FOUR tablets (2000 mg) by mouth ONE HOUR BEFORE any dental procedures  *If you need a refill on your cardiac medications before your next appointment, please call your pharmacy*   Lab Work: none   Testing/Procedures: As planned   Follow-Up: As planned

## 2021-03-20 ENCOUNTER — Other Ambulatory Visit: Payer: Self-pay | Admitting: Medical

## 2021-03-20 NOTE — Telephone Encounter (Signed)
Prescription refill request for Eliquis received.  Indication: afib  Last office visit: Grandville Silos 03/18/2021 Scr: 1.14, 03/04/2021 Age: 87 yo  Weight: 72.9 kg   Pt is on the correct dose of Eliquis per dosing criteria, prescription refill sent.

## 2021-03-26 ENCOUNTER — Emergency Department (HOSPITAL_COMMUNITY): Payer: Medicare HMO

## 2021-03-26 ENCOUNTER — Emergency Department (HOSPITAL_COMMUNITY)
Admission: EM | Admit: 2021-03-26 | Discharge: 2021-03-27 | Disposition: A | Discharge status: Home / Self Care | Payer: Medicare HMO | Attending: Emergency Medicine | Admitting: Emergency Medicine

## 2021-03-26 ENCOUNTER — Encounter (HOSPITAL_COMMUNITY): Payer: Self-pay

## 2021-03-26 DIAGNOSIS — Z950 Presence of cardiac pacemaker: Secondary | ICD-10-CM | POA: Insufficient documentation

## 2021-03-26 DIAGNOSIS — J449 Chronic obstructive pulmonary disease, unspecified: Secondary | ICD-10-CM | POA: Insufficient documentation

## 2021-03-26 DIAGNOSIS — N183 Chronic kidney disease, stage 3 unspecified: Secondary | ICD-10-CM

## 2021-03-26 DIAGNOSIS — Z20822 Contact with and (suspected) exposure to covid-19: Secondary | ICD-10-CM | POA: Diagnosis not present

## 2021-03-26 DIAGNOSIS — R0602 Shortness of breath: Secondary | ICD-10-CM | POA: Insufficient documentation

## 2021-03-26 DIAGNOSIS — I5042 Chronic combined systolic (congestive) and diastolic (congestive) heart failure: Secondary | ICD-10-CM | POA: Insufficient documentation

## 2021-03-26 DIAGNOSIS — R0603 Acute respiratory distress: Secondary | ICD-10-CM

## 2021-03-26 DIAGNOSIS — I110 Hypertensive heart disease with heart failure: Secondary | ICD-10-CM | POA: Diagnosis not present

## 2021-03-26 DIAGNOSIS — I350 Nonrheumatic aortic (valve) stenosis: Secondary | ICD-10-CM

## 2021-03-26 DIAGNOSIS — I2510 Atherosclerotic heart disease of native coronary artery without angina pectoris: Secondary | ICD-10-CM

## 2021-03-26 LAB — CBC WITH DIFFERENTIAL/PLATELET
Abs Immature Granulocytes: 0.03 10*3/uL (ref 0.00–0.07)
Basophils Absolute: 0 10*3/uL (ref 0.0–0.1)
Basophils Relative: 0 %
Eosinophils Absolute: 0.4 10*3/uL (ref 0.0–0.5)
Eosinophils Relative: 5 %
HCT: 30.7 % — ABNORMAL LOW (ref 39.0–52.0)
Hemoglobin: 10.1 g/dL — ABNORMAL LOW (ref 13.0–17.0)
Immature Granulocytes: 0 %
Lymphocytes Relative: 34 %
Lymphs Abs: 2.7 10*3/uL (ref 0.7–4.0)
MCH: 32.2 pg (ref 26.0–34.0)
MCHC: 32.9 g/dL (ref 30.0–36.0)
MCV: 97.8 fL (ref 80.0–100.0)
Monocytes Absolute: 0.8 10*3/uL (ref 0.1–1.0)
Monocytes Relative: 10 %
Neutro Abs: 3.9 10*3/uL (ref 1.7–7.7)
Neutrophils Relative %: 51 %
Platelets: 222 10*3/uL (ref 150–400)
RBC: 3.14 MIL/uL — ABNORMAL LOW (ref 4.22–5.81)
RDW: 13.7 % (ref 11.5–15.5)
WBC: 7.7 10*3/uL (ref 4.0–10.5)
nRBC: 0 % (ref 0.0–0.2)

## 2021-03-26 LAB — BASIC METABOLIC PANEL
Anion gap: 9 (ref 5–15)
BUN: 23 mg/dL (ref 8–23)
CO2: 22 mmol/L (ref 22–32)
Calcium: 8.9 mg/dL (ref 8.9–10.3)
Chloride: 106 mmol/L (ref 98–111)
Creatinine, Ser: 1.63 mg/dL — ABNORMAL HIGH (ref 0.61–1.24)
GFR, Estimated: 41 mL/min — ABNORMAL LOW (ref >60)
Glucose, Bld: 127 mg/dL — ABNORMAL HIGH (ref 70–99)
Potassium: 4.2 mmol/L (ref 3.5–5.1)
Sodium: 137 mmol/L (ref 135–145)

## 2021-03-26 LAB — RESP PANEL BY RT-PCR (FLU A&B, COVID) ARPGX2
Influenza A by PCR: NEGATIVE
Influenza B by PCR: NEGATIVE
SARS Coronavirus 2 by RT PCR: NEGATIVE

## 2021-03-26 LAB — TROPONIN I (HIGH SENSITIVITY)
Troponin I (High Sensitivity): 29 ng/L — ABNORMAL HIGH (ref <18)
Troponin I (High Sensitivity): 31 ng/L — ABNORMAL HIGH (ref <18)

## 2021-03-26 LAB — BRAIN NATRIURETIC PEPTIDE: B Natriuretic Peptide: 409.8 pg/mL — ABNORMAL HIGH (ref 0.0–100.0)

## 2021-03-26 NOTE — ED Notes (Signed)
Pt in room with son. Denies SOB at this time. RA 99%. Pt given blanket. Denies any other needs. Updated on plan of care.

## 2021-03-26 NOTE — ED Notes (Signed)
Cardiology at bedside.

## 2021-03-26 NOTE — ED Provider Notes (Addendum)
[facility] EMERGENCY DEPARTMENT Provider Note   CSN: [id number] Arrival date & time: 03/26/21  1557     History Chief Complaint  Patient presents with   Shortness of Breath    Blake Burgess is a 87 y.o. male.  87 year old male male with history of HTN, GERD, COPD, CAD, DM, diastolic heart failure, aortic stenosis with valve replacement 02/2021, pacemaker with reported broken wire, brought in by EMS for shortness of breath. Patient states he was riding in a car when he became [facility], went home and did a breathing treatment that eventually helped. EMS was called and patient was given ASA and nitro, denies ever having CP with this event. Reports he has been coughing, no fevers, no leg swelling, no other complaints or concerns.       Past Medical History:  Diagnosis Date   Aortic stenosis    mild AS 09/2017 echo   Cancer [facility])    skin   COPD (chronic obstructive pulmonary disease) (HCC)    Stage 3 per patient's son   Coronary artery disease    a.  s/p CABG;   b. cath 4/12: EF 55%, 3vCAD, patent L-LAD, patent S-RCA, patent S-CFX (done after a false pos. 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   [place]    Chronic apical periodontitis    Retained dental root    Chronic periodontitis    Persistent atrial fibrillation ([place]) 11/19/2020   Precordial chest pain 11/19/2020   Pacemaker 11/06/2019   Educated about COVID-19 virus infection 12/12/2018   Aortic stenosis, severe 12/12/2018   Actinic skin damage 10/13/2018   Back pain 10/13/2018   Cancer ([place])  10/13/2018   Diverticulosis 10/13/2018   Spinal stenosis 10/13/2018   Hearing deficit 10/13/2018   Complete atrioventricular block ([place]) 10/13/2018   Dyspnea 10/13/2018   S/P shoulder replacement, right 08/25/2018   History of artificial joint 08/25/2018   Preop cardiovascular exam 07/13/2018   Coronary artery disease involving native coronary artery of native heart without angina pectoris 06/05/2018   Chronic combined systolic and diastolic heart failure ([place]) 06/05/2018   Dyslipidemia 06/05/2018   Pain in joint of right shoulder 05/09/2018   Osteoarthritis of right glenohumeral joint 05/09/2018   ................................................................................... 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   Cardiac pacemaker in situ [id number]   Acute diastolic (congestive) heart failure ([place]) [id number]   Acute diastolic heart failure ([place]) 10/11/2017   CHB (complete heart block) (HCC)    SOB (shortness of breath)    Dyslipidemia, goal LDL below 70    Risk for falls 12/21/2016   Parent-foster child problem 11/25/2016   Lumbar stenosis with neurogenic claudication 08/04/2016   Lightheadedness 02/03/2016   Right ear pain 03/22/2013   Shingles 03/22/2013   Other specified cardiac arrhythmias 02/19/2013   Old myocardial infarction 02/19/2013   History of total hip replacement 02/19/2013   History of TIA (transient ischemic  attack) 02/19/2013   Type II diabetes mellitus ([place]) 02/19/2013   Generalized ischemic cerebrovascular disease 02/19/2013   Heart disease 02/19/2013   Status post aorto-coronary artery bypass graft 02/19/2013   Anemia 02/18/2013   B12 deficiency 02/18/2013   Kidney stones 02/18/2013   GERD (gastroesophageal reflux disease) 02/18/2013   Esophageal stricture 02/18/2013   Stroke ([place]) 02/18/2013   DJD (degenerative joint disease) 02/18/2013   Hyperlipidemia 02/18/2013   Arthritis 01/10/2013    Angina pectoris ([place]) 12/14/2010   Malaise and fatigue 10/19/2010   COUGH 08/07/2010   ANEMIA, SECONDARY TO ACUTE BLOOD LOSS 03/03/2010   GI BLEED 03/03/2010   Osteoarthritis 03/03/2010   Gastrointestinal hemorrhage, unspecified 03/03/2010   Atherosclerotic heart disease of native coronary artery without angina pectoris 02/23/2010   DIZZINESS 12/04/2009   Essential hypertension 02/05/2009   Hx of CABG-2008 02/05/2009   History of coronary artery bypass graft 02/05/2009   H/O acute myocardial infarction 08/23/2006    Past Surgical History:  Procedure Laterality Date   ARTERIOVENOUS GRAFT PLACEMENT W/ ENDOSCOPIC VEIN HARVEST     of the right leg greater spahenous vein. Surgeon: Tharon Aquas Trigt,M.D.   BACK SURGERY  2017   COLONOSCOPY  02/24/2010   Hemorrhoids, Diverticulosis. Performed at [place]. Normal terminal ileum. Dr. June Leap, [place] ARTERY BYPASS GRAFT  06/21/2007   CABG x 3 Surgeon Ivin Poot, MD   EYE SURGERY     bilateral cataract removal   hip replace  06/09/2004   left hip Surgeon Pietro Cassis. Alvan Dame, MD   INTRAOPERATIVE TRANSTHORACIC ECHOCARDIOGRAM Left 03/03/2021   Procedure: INTRAOPERATIVE TRANSTHORACIC ECHOCARDIOGRAM;  Surgeon: Burnell Blanks, MD;  Location: [address];  Service: Open Heart Surgery;  Laterality: Left;   LAPAROSCOPIC CHOLECYSTECTOMY  2021   LEFT HEART CATH AND CORS/GRAFTS ANGIOGRAPHY N/A 11/04/2020   Procedure: LEFT HEART CATH AND CORS/GRAFTS ANGIOGRAPHY;  Surgeon: Troy Sine, MD;  Location: [place] [facility];  Service: Cardiovascular;  Laterality: N/A;   MULTIPLE EXTRACTIONS WITH ALVEOLOPLASTY N/A 02/03/2021   Procedure: MULTIPLE EXTRACTION WITH ALVEOLOPLASTY;  Surgeon: Charlaine Dalton, DMD;  Location: [place];  Service: Dentistry;  Laterality: N/A;   PACEMAKER IMPLANT N/A 10/03/2017   Procedure: PACEMAKER IMPLANT;  Surgeon: Evans Lance, MD;  Location: [place] [facility];  Service: Cardiovascular;  Laterality: N/A;    REVERSE SHOULDER ARTHROPLASTY Right 08/25/2018   Procedure: REVERSE SHOULDER ARTHROPLASTY;  Surgeon: Netta Cedars, MD;  Location: [place];  Service: Orthopedics;  Laterality: Right;   TRANSCATHETER AORTIC VALVE REPLACEMENT, TRANSFEMORAL Bilateral 03/03/2021   Procedure: TRANSCATHETER AORTIC VALVE REPLACEMENT, TRANSFEMORAL;  Surgeon: Burnell Blanks, MD;  Location: [place];  Service: Open Heart Surgery;  Laterality: Bilateral;   ULTRASOUND GUIDANCE FOR VASCULAR ACCESS Bilateral 03/03/2021   Procedure: ULTRASOUND GUIDANCE FOR VASCULAR ACCESS;  Surgeon: Burnell Blanks, MD;  Location: [place];  Service: Open Heart Surgery;  Laterality: Bilateral;       Family History  Problem Relation Age of Onset   Heart attack Mother    Hypertension Mother    Diabetes Father    Diabetes Brother    Diabetes Sister     Social History   Tobacco Use   Smoking status: Never   Smokeless tobacco: Never  Vaping Use   Vaping Use: Never used  Substance Use Topics   Alcohol use: No   Drug use: No    Home Medications Prior to Admission medications   Medication Sig Start Date End Date Taking? Authorizing Provider  albuterol (PROVENTIL) (2.5 MG/3ML) 0.083% nebulizer solution Take 3 mLs (2.5 mg total) by nebulization every 6 (six) hours as needed for wheezing or shortness of breath. 02/11/21  Yes Icard, Leory Plowman L, DO  amLODipine (NORVASC) 2.5 MG tablet Take 2.5 mg by mouth in the morning. 10/18/19  Yes [provider]  apixaban (ELIQUIS) 5 MG TABS tablet TAKE 1 TABLET BY MOUTH TWICE A DAY Patient taking differently: Take 5 mg by mouth 2 (two) times daily. 03/20/21  Yes Kroeger, Lorelee Cover., PA-C  aspirin 81 MG chewable tablet Chew 1 tablet (81 mg total) by mouth daily. 03/05/21  Yes Kathyrn Drown D, NP  atorvastatin (LIPITOR) 40 MG tablet Take 40 mg by mouth in the morning. 10/22/20  Yes [provider]  cyanocobalamin (,VITAMIN B-12,) 1000 MCG/ML injection Inject 1,000 mcg into the muscle  every 30 (thirty) days. 12/18/20  Yes [provider]  donepezil (ARICEPT) 10 MG tablet Take 10 mg by mouth at bedtime.   Yes [provider]  enalapril (VASOTEC) 20 MG tablet Take 20 mg by mouth 2 (two) times daily.   Yes [provider]  Fluticasone-Umeclidin-Vilant (TRELEGY ELLIPTA) 100-62.5-25 MCG/INH AEPB Inhale 1 puff into the lungs daily. Rinse mouth after 02/05/21  Yes Icard, Bradley L, DO  furosemide (LASIX) 40 MG tablet Take 40 mg by mouth in the morning.   Yes [provider]  isosorbide mononitrate (IMDUR) 60 MG 24 hr tablet Take 1 tablet (60 mg total) by mouth daily. 11/20/20  Yes Minus Breeding, MD  Lidocaine 4 % PTCH Apply 1 patch topically daily as needed (pain).   Yes [provider]  metoprolol succinate (TOPROL-XL) 50 MG 24 hr tablet Take 1 tablet (50 mg total) by mouth daily. 11/12/20  Yes Kroeger, Daleen Snook M., PA-C  nitroGLYCERIN (NITROSTAT) 0.4 MG SL tablet Place 1 tablet (0.4 mg total) under the tongue every 5 (five) minutes as needed for chest pain. 11/12/20  Yes Kroeger, Daleen Snook M., PA-C  omeprazole (PRILOSEC) 20 MG capsule Take 20 mg by mouth in the morning. 01/19/21  Yes [provider]  tamsulosin (FLOMAX) 0.4 MG CAPS capsule Take 0.4 mg by mouth in the morning.   Yes [provider]    Allergies    Patient has no known allergies.  Review of Systems   Review of Systems  Constitutional:  Negative for chills, diaphoresis and fever.  HENT:  Negative for congestion.   Respiratory:  Positive for cough and shortness of breath.   Cardiovascular:  Negative for chest pain, palpitations and leg swelling.  Gastrointestinal:  Negative for abdominal pain, nausea and vomiting.  Genitourinary:  Negative for dysuria.  Musculoskeletal:  Negative for arthralgias and myalgias.  Skin:  Negative for rash and wound.  Allergic/Immunologic: Positive for immunocompromised state.  Neurological:  Negative for weakness.   Hematological:  Negative for adenopathy.  Psychiatric/Behavioral:  Negative for confusion.   All other systems reviewed and are negative.  Physical Exam Updated Vital Signs BP (!) 167/80   Pulse 67   Temp 98.8 F (37.1 C) (Oral)   Resp 14   Ht '5\' 9"'$  (1.753 m)   Wt 72.9 kg   SpO2 98%   BMI 23.73 kg/m   Physical Exam Vitals and nursing note reviewed.  Constitutional:      General: He is not in acute distress.    Appearance: He is well-developed. He is not diaphoretic.  HENT:     Head: Normocephalic and atraumatic.     Mouth/Throat:  Mouth: Mucous membranes are moist.  Eyes:     Conjunctiva/sclera: Conjunctivae normal.  Cardiovascular:     Rate and Rhythm: Normal rate and regular rhythm.     Heart sounds: Normal heart sounds.  Pulmonary:     Effort: Pulmonary effort is normal.     Breath sounds: Normal breath sounds.  Abdominal:     Palpations: Abdomen is soft.     Tenderness: There is no abdominal tenderness.  Musculoskeletal:     Cervical back: Neck supple.     Right lower leg: No edema.     Left lower leg: No edema.  Skin:    General: Skin is warm and dry.     Findings: No erythema or rash.  Neurological:     Mental Status: He is alert and oriented to person, place, and time.  Psychiatric:        Behavior: Behavior normal.    ED Results / Procedures / Treatments   Labs (all labs ordered are listed, but only abnormal results are displayed) Labs Reviewed  BASIC METABOLIC PANEL - Abnormal; Notable for the following components:      Result Value   Glucose, Bld 127 (*)    Creatinine, Ser 1.63 (*)    GFR, Estimated 41 (*)    All other components within normal limits  CBC WITH DIFFERENTIAL/PLATELET - Abnormal; Notable for the following components:   RBC 3.14 (*)    Hemoglobin 10.1 (*)    HCT 30.7 (*)    All other components within normal limits  BRAIN NATRIURETIC PEPTIDE - Abnormal; Notable for the following components:   B Natriuretic Peptide 409.8 (*)     All other components within normal limits  TROPONIN I (HIGH SENSITIVITY) - Abnormal; Notable for the following components:   Troponin I (High Sensitivity) 29 (*)    All other components within normal limits  TROPONIN I (HIGH SENSITIVITY) - Abnormal; Notable for the following components:   Troponin I (High Sensitivity) 31 (*)    All other components within normal limits  RESP PANEL BY RT-PCR (FLU A&B, COVID) [id number]    EKG EKG Interpretation  Date/Time:  Thursday March 26 2021 16:17:07 EDT Ventricular Rate:  60 PR Interval:  51 QRS Duration: 109 QT Interval:  478 QTC Calculation: 478 R Axis:   77 Text Interpretation: Atrial-sensed ventricular-paced rhythm No further analysis attempted due to paced rhythm Confirmed by Fredia Sorrow [phone]) on 03/26/2021 4:42:57 PM  Radiology DG Chest Port 1 View  Result Date: 03/26/2021 CLINICAL DATA:  Shortness of breath. Hypertension, aortic stenosis, COPD, CAD, stroke. EXAM: PORTABLE CHEST 1 VIEW COMPARISON:  Chest x-ray 03/15/2021, CT chest 02/13/2021 FINDINGS: The heart size and mediastinal contours are unchanged. Aortic calcification. Cardiac surgical changes overlie the mediastinum. Aortic valve replacement. No focal consolidation. No pulmonary edema. No definite pleural effusion. No pneumothorax. No acute osseous abnormality. Partially visualized reverse total right shoulder arthroplasty. Moderate to severe degenerative changes of the left glenohumeral joint. IMPRESSION: No active disease. Electronically Signed   By: Iven Finn M.D.   On: 03/26/2021 16:37    Procedures Procedures   Medications Ordered in ED Medications - No data to display  ED Course  I have reviewed the triage vital signs and the nursing notes.  Pertinent labs & imaging results that were available during my care of the patient were reviewed by me and considered in my medical decision making (see chart for details).  Clinical Course as of 03/26/21 2337  Thu Aug  04,  759  [id number] 87 year old male brought in by EMS from home with episode of shortness of breath today which occurred while at rest and resolved after time and a nebulizer treatment at home.  Patient was given aspirin and nitro by EMS although denies ever having had chest pain.  He does not have any lower extremity edema, his lungs are clear on exam and he is in no distress. Patient discussed with Dr. Bobby Rumpf, ER attending who has seen the patient and discussed care with family at the bedside.  Family is concerned due to significant shortness of breath with any degree of exertion and is not comfortable waiting for his August 12 cardiology follow-up appointment. Case discussed with cardiology who will see the patient.  Patient's labs are overall reassuring, no significant changes in his CBC, BMP.  He is COVID and flu negative.  His chest x-ray is unremarkable and his BNP is 409. [LM]  2335 Patient was seen by cardiology, requested troponin and if found to be significantly elevated, requested call to fellow on-call at the time of the results otherwise patient could be discharged to follow-up in clinic. Initial troponin results 29, previously 14 and 13 on record review.  Case discussed with Dr. Vickki Muff with cardiology, requests delta troponin and if not significantly changed, may be discharged.  Repeat troponin is 31.  Discussed results with patient and his son at bedside.  Patient would like to be discharged at this time, is agreeable with follow-up plan in clinic with return to ER precautions. [LM]    Clinical Course User Index [LM] Roque Lias   MDM Rules/Calculators/A&P                           Final Clinical Impression(s) / ED Diagnoses Final diagnoses:  SOB (shortness of breath)    Rx / DC Orders ED Discharge Orders     None        Tacy Learn, PA-C 03/26/21 1953    Tacy Learn, PA-C 03/26/21 2337    Fredia Sorrow, MD 04/03/21 1534

## 2021-03-26 NOTE — H&P (Addendum)
Cardiology History and Physical:   Patient ID: Blake Burgess MRN: [id number]; DOB: 01/29/1932  Admit date: 03/26/2021 Date of Consult: 03/26/2021  PCP:  Raelene Bott, MD   [facility] Providers Cardiologist:  Minus Breeding, MD  Structural Heart:  Lauree Chandler, MD      Patient Profile:   Blake Burgess is a 87 y.o. male with a hx of multivessel CAD s/p CABG in the remote past, HTN, severe COPD (due to chicken farming), PAF on Eliquis, CHB s/ PPM in 2019 increased atrial lead impedence, ICM with chronic systolic CHF, previous stroke, GERD, hiatal hernia and severe aortic stenosis s/p TAVR (03/03/21), who who is being seen 03/26/2021 for the evaluation of SOB at the request of Dr Rogene Houston.  Patient's cardiac history dates back to 2008 when he underwent coronary artery bypass grafting by Dr. Darcey Nora.  He has been followed for many years by Dr. Percival Spanish.  Patient has chronic exertional shortness of breath that has progressed.  He now gets short of breath with very low level activity.  Previous echocardiograms have documented the presence of moderately decreased left ventricular systolic function with moderate aortic stenosis.  The patient underwent diagnostic cardiac catheterization in March 2022 which revealed severe native coronary artery disease but continued patency of all bypass grafts involving all 3 vascular territories.  Follow-up transthoracic echocardiogram performed December 10, 2020 revealed some progression in the severity of the patient's aortic stenosis.  Left ventricular ejection fraction was estimated 45 to 50%.  The aortic valve was trileaflet with severe thickening and restricted leaflet mobility involving all 3 leaflets.  Peak velocity across aortic valve measured 3.2 m/s corresponding to mean transvalvular gradient estimated 21.9 mmHg and aortic valve area calculated 1.09 cm by VTI.  The DVI was notably 0.24 and stroke-volume index 47.  The patient has been seen in  consultation previously by Dr. Angelena Form.  CT angiography was performed and the patient was referred for surgical consultation. He was evaluated recently by Dr. 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  The patient has never been a smoker.   He was evaluated by the multidisciplinary valve team and underwent a successful TAVR with a 26 mm Edwards Sapien 3 Ultra THV via the TF approach on 03/03/21. Post operative echo showed EF 50%, normally functioning TAVR with a mean gradient of 10 mmHg and no PVL.   EP was asked to see the patient for device interrogation and intermittent HRs in the 40's. Per their note, patient has known atrial lead issues (senses OK) with eventual plans for lead revision. On their telemetry review, he was in Sinus Brady/NSR in 50-70s with occasional RV (HIS bundle) pacing. 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 Suspected that sinus node was stunned post op resulting in him intermittently pacing at his lower limit. No new recommendations were made and he has an apt with Dr. Lovena Le on 8/12 to discuss lead revision. He was discharged on Eliquis and the addition of a baby aspirin x 6 months.   History of Present Illness:   Blake Burgess was seen by Ms. Grandville Silos in the structural clinic on 03/18/2021.  He was complaining that his breathing had not improved much since the procedure.  He was still complaining of dyspnea on exertion, but felt that the procedure made a great deal of difference.  He did not have significant volume overload on  exam and his sites were healing normally.  Weight was 160 pounds.  Today, he came to the emergency room for shortness of breath and cardiology was asked to evaluate him.  Blake Burgess states that he was fine when he got up this morning.  He has had problems with the heat and getting short of breath with this.  He and his wife went to  town, but he stayed in the car in the air conditioning.  After they came back, he had sudden onset of shortness of breath.  He states he was wheezing a lot at the time.  He does not take his Trelegy every day, seems to be using it as a as needed med.  He took the Trelegy and did 2 nebulizers without much improvement.  He called 911, and he chewed 4 baby aspirin's with out any change. He then took a nitroglycerin and says his shortness of breath improved.  At 1 point, his sats were as low as 93%.  EMS had started him on O2.  However, his breathing is improved and currently he is satting 98% on room air.  He denies any respiratory distress at this time.  He did not have any chest pain or palpitations.   Past Medical History:  Diagnosis Date   Aortic stenosis    mild AS 09/2017 echo   Cancer [facility])    skin   COPD (chronic obstructive pulmonary disease) (HCC)    Stage 3 per patient's son   Coronary artery disease    a.  s/p CABG;   b. cath 4/12: EF 55%, 3vCAD, patent L-LAD, patent S-RCA, patent S-CFX (done after a false pos. ETT)   Dementia [facility])    Per son   Diverticular disease    GERD (gastroesophageal reflux disease)    GI bleed    Hemorrhoids    HH (hiatus hernia)    History of kidney stones    Hypertension    Osteoarthritis    Other and unspecified hyperlipidemia    Presence of permanent cardiac pacemaker    Schatzki's ring    Stroke [facility])     Past Surgical History:  Procedure Laterality Date   ARTERIOVENOUS GRAFT PLACEMENT W/ ENDOSCOPIC VEIN HARVEST     of the right leg greater spahenous vein. Surgeon: Tharon Aquas Trigt,M.D.   BACK SURGERY  2017   COLONOSCOPY  02/24/2010   Hemorrhoids, Diverticulosis. Performed at [place]. Normal terminal ileum. Dr. June Leap, [place] ARTERY BYPASS GRAFT  06/21/2007   CABG x 3 Surgeon Ivin Poot, MD   EYE SURGERY     bilateral cataract removal   hip replace  06/09/2004   left hip Surgeon Pietro Cassis. Alvan Dame, MD    INTRAOPERATIVE TRANSTHORACIC ECHOCARDIOGRAM Left 03/03/2021   Procedure: INTRAOPERATIVE TRANSTHORACIC ECHOCARDIOGRAM;  Surgeon: Burnell Blanks, MD;  Location: [place];  Service: Open Heart Surgery;  Laterality: Left;   LAPAROSCOPIC CHOLECYSTECTOMY  2021   LEFT HEART CATH AND CORS/GRAFTS ANGIOGRAPHY N/A 11/04/2020   Procedure: LEFT HEART CATH AND CORS/GRAFTS ANGIOGRAPHY;  Surgeon: Troy Sine, MD;  Location: [place] CV LAB;  Service: Cardiovascular;  Laterality: N/A;   MULTIPLE EXTRACTIONS WITH ALVEOLOPLASTY N/A 02/03/2021   Procedure: MULTIPLE EXTRACTION WITH ALVEOLOPLASTY;  Surgeon: Charlaine Dalton, DMD;  Location: [place];  Service: Dentistry;  Laterality: N/A;   PACEMAKER IMPLANT N/A 10/03/2017   Procedure: PACEMAKER IMPLANT;  Surgeon: Evans Lance, MD;  Location: [place] CV LAB;  Service: Cardiovascular;  Laterality:  N/A;   REVERSE SHOULDER ARTHROPLASTY Right 08/25/2018   Procedure: REVERSE SHOULDER ARTHROPLASTY;  Surgeon: Netta Cedars, MD;  Location: [place];  Service: Orthopedics;  Laterality: Right;   TRANSCATHETER AORTIC VALVE REPLACEMENT, TRANSFEMORAL Bilateral 03/03/2021   Procedure: TRANSCATHETER AORTIC VALVE REPLACEMENT, TRANSFEMORAL;  Surgeon: Burnell Blanks, MD;  Location: [place];  Service: Open Heart Surgery;  Laterality: Bilateral;   ULTRASOUND GUIDANCE FOR VASCULAR ACCESS Bilateral 03/03/2021   Procedure: ULTRASOUND GUIDANCE FOR VASCULAR ACCESS;  Surgeon: Burnell Blanks, MD;  Location: [place];  Service: Open Heart Surgery;  Laterality: Bilateral;     Home Medications:  Prior to Admission medications   Medication Sig Start Date End Date Taking? Authorizing Provider  acetaminophen (TYLENOL) 500 MG tablet Take 1,000 mg by mouth every 6 (six) hours as needed for moderate pain.    [provider]  albuterol (PROVENTIL) (2.5 MG/3ML) 0.083% nebulizer solution Take 3 mLs (2.5 mg total) by nebulization every 6 (six) hours as needed for wheezing  or shortness of breath. 02/11/21   Icard, Octavio Graves, DO  amLODipine (NORVASC) 2.5 MG tablet Take 2.5 mg by mouth in the morning. 10/18/19   [provider]  amoxicillin (AMOXIL) 500 MG tablet Take FOUR tablets (2000 mg) by mouth ONE HOUR before any dental procedures 03/18/21   Eileen Stanford, PA-C  apixaban (ELIQUIS) 5 MG TABS tablet TAKE 1 TABLET BY MOUTH TWICE A DAY 03/20/21   Kroeger, Daleen Snook M., PA-C  aspirin 81 MG chewable tablet Chew 1 tablet (81 mg total) by mouth daily. 03/05/21   Tommie Raymond, NP  atorvastatin (LIPITOR) 40 MG tablet Take 40 mg by mouth in the morning. 10/22/20   [provider]  cyanocobalamin (,VITAMIN B-12,) 1000 MCG/ML injection Inject 1,000 mcg into the muscle every 30 (thirty) days. 12/18/20   [provider]  donepezil (ARICEPT) 10 MG tablet Take 10 mg by mouth at bedtime.    [provider]  enalapril (VASOTEC) 20 MG tablet Take 20 mg by mouth 2 (two) times daily.    [provider]  Fluticasone-Umeclidin-Vilant (TRELEGY ELLIPTA) 100-62.5-25 MCG/INH AEPB Inhale 1 puff into the lungs daily. Rinse mouth after 02/05/21   Icard, Leory Plowman L, DO  furosemide (LASIX) 40 MG tablet Take 40 mg by mouth in the morning.    [provider]  isosorbide mononitrate (IMDUR) 60 MG 24 hr tablet Take 1 tablet (60 mg total) by mouth daily. 11/20/20   Minus Breeding, MD  Lidocaine 4 % PTCH Apply 1 patch topically daily as needed (pain).    [provider]  metoprolol succinate (TOPROL-XL) 50 MG 24 hr tablet Take 1 tablet (50 mg total) by mouth daily. 11/12/20   Kroeger, Lorelee Cover., PA-C  nitroGLYCERIN (NITROSTAT) 0.4 MG SL tablet Place 1 tablet (0.4 mg total) under the tongue every 5 (five) minutes as needed for chest pain. 11/12/20   Kroeger, Lorelee Cover., PA-C  omeprazole (PRILOSEC) 20 MG capsule Take 20 mg by mouth in the morning. 01/19/21   [provider]  tamsulosin (FLOMAX) 0.4 MG CAPS capsule Take 0.4 mg by mouth in the  morning.    [provider]    Inpatient Medications: Scheduled Meds:  Continuous Infusions:  PRN Meds:   Allergies:   No Known Allergies  Social History:   Social History   Socioeconomic History   Marital status: Married    Spouse name: Not on file   Number of children: 2   Years of education: Not on  file   Highest education level: Not on file  Occupational History   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 Daily Caffeine: 24 oz pepsi and 1 cup coffee.    Social Determinants of Health   Financial Resource Strain: Not on file  Food Insecurity: Not on file  Transportation Needs: Not on file  Physical Activity: Not on file  Stress: Not on file  Social Connections: Not on file  Intimate Partner Violence: Not on file    Family History:    Family History  Problem Relation Age of Onset   Heart attack Mother    Hypertension Mother    Diabetes Father    Diabetes Brother    Diabetes Sister      ROS:  Please see the history of present illness.  All other ROS reviewed and negative.     Physical Exam/Data:   Vitals:   03/26/21 1715 03/26/21 1745 03/26/21 1800 03/26/21 1830  BP: (!) 145/56 (!) 157/51 (!) 163/62 (!) 155/98  Pulse: 66 68 83 73  Resp: '17 17 17 15  '$ Temp:      TempSrc:      SpO2: 95% 100% 99% 93%  Weight:      Height:       No intake or output data in the 24 hours ending 03/26/21 1921 Last 3 Weights 03/26/2021 03/18/2021 03/04/2021  Weight (lbs) 160 lb 11.5 oz 160 lb 12.8 oz 164 lb 7.4 oz  Weight (kg) 72.9 kg 72.938 kg 74.6 kg     Body mass index is 23.73 kg/m.  General:  Well nourished, slender elderly male, in no acute distress HEENT:  normal Lymph: no adenopathy Neck: no JVD Endocrine:  No thryomegaly Vascular: No carotid bruits; 4/4 extremity pulses 2+ bilaterally Cardiac:  normal S1, S2; RRR; no murmur  Lungs: Some rales bases bilaterally, no wheezing, rhonchi or rales  Abd: soft, nontender, no hepatomegaly  Ext: no edema Musculoskeletal:  No deformities, BUE and BLE strength normal and equal Skin: warm and dry  Neuro:  CNs 2-12 intact, no focal abnormalities noted Psych:  Normal affect   EKG:  The EKG was personally reviewed and demonstrates: Sinus rhythm with V pacing, heart rate 74 Telemetry:  Telemetry was personally reviewed and demonstrates: Ventricular paced rhythm  Relevant CV Studies:   TAVR 03/03/21:    Date of Procedure:                03/03/2021   Preoperative Diagnosis:      Severe Aortic Stenosis   Postoperative Diagnosis:    Same   Procedure:        Transcatheter Aortic Valve Replacement - Transfemoral Approach             Edwards Sapien 3 THV (size 26 mm, model # L876275, serial # [id number])              Co-Surgeons:                        Lauree Chandler, MD and Valentina Gu. Roxy Manns, MD   Anesthesiologist:  Rose   Echocardiographer:              Johnsie Cancel   Pre-operative Echo Findings: Severe aortic stenosis Normal left ventricular systolic function   Post-operative Echo Findings: No paravalvular leak Normal left ventricular systolic function   _________________________     Echo 03/04/21 IMPRESSIONS   1. Abnormal septal motion . Left ventricular ejection fraction, by  estimation, is 50 to 55%. The left ventricle has low normal function. The left ventricle has no regional wall motion abnormalities. There is mild left ventricular hypertrophy. Left ventricular diastolic parameters are consistent with Grade I diastolic dysfunction (impaired relaxation).   2. Pacing wires in RA/RV . Right ventricular systolic function is normal. The right ventricular size is normal.  There is normal pulmonary artery systolic pressure.   3. Left atrial size was moderately dilated.   4. Right atrial size was moderately dilated.   5. The mitral valve is degenerative. Mild mitral valve regurgitation. No evidence of mitral stenosis. Moderate mitral annular calcification.   6. Post TAVR 03/03/21 Well placed 26 mm Sapien 3 valve No PVL mean gradient 10 peak 15 mmHg AVA 2.5 cm2. The aortic valve has been repaired/replaced. Aortic valve regurgitation is not visualized. No aortic stenosis is present. Procedure Date: 03/03/2021.   7. The inferior vena cava is normal in size with greater than 50%  respiratory variability, suggesting right atrial pressure of 3 mmHg.  CARDIAC CATH: 11/04/2020 Mid LM to Prox LAD lesion is 95% stenosed. Mid LAD lesion is 100% stenosed. Dist LAD lesion is 25% stenosed. Ost Cx to Prox Cx lesion is 80% stenosed. Mid Cx lesion is 90% stenosed. 2nd Mrg lesion is 50% stenosed. Prox RCA lesion is 95% stenosed. Mid RCA lesion is 100% stenosed.   Severe native coronary calcification and multivessel CAD without left main and a common ostium giving  rise to a severely calcified proximal LAD with 95% stenosis and total occlusion of the LAD after a prominent septal perforating artery; and 80% ostial calcified circumflex stenosis followed by 90% proximal to mid circumflex stenosis.  There is competitive filling of the distal circumflex via the SVG supplying the OM 3 vessel.   Total mid occlusion of the native RCA.   Patent LIMA graft supplying the mid LAD with the distal LAD being a small caliber vessel.   Patent SVG supplying the OM 3 vessel of the circumflex coronary artery.   Patent SVG supplying the distal RCA.   Mild aortic valve stenosis with a 17 mm peak gradient on LV to AO pullback.   RECOMMENDATION: Increase medical therapy with possible further titration of amlodipine and beta-blocker therapy.  Recommend a follow-up 2D echo Doppler study.  The patient  will follow up with Dr. Percival Spanish. Diagnostic Dominance: Right    Laboratory Data:  High Sensitivity Troponin:  No results for input(s): TROPONINIHS in the last 720 hours.   Chemistry Recent Labs  Lab 03/26/21 1607  NA 137  K 4.2  CL 106  CO2 22  GLUCOSE 127*  BUN 23  CREATININE 1.63*  CALCIUM 8.9  GFRNONAA 41*  ANIONGAP 9    No results for input(s): PROT, ALBUMIN, AST, ALT, ALKPHOS, BILITOT in the last 168 hours. Hematology Recent Labs  Lab 03/26/21 1607  WBC 7.7  RBC 3.14*  HGB 10.1*  HCT 30.7*  MCV 97.8  MCH 32.2  MCHC 32.9  RDW 13.7  PLT 222   BNP Recent Labs  Lab 03/26/21 1607  BNP 409.8*  DDimer No results for input(s): DDIMER in the last 168 hours.   Radiology/Studies:  DG Chest Port 1 View  Result Date: 03/26/2021 CLINICAL DATA:  Shortness of breath. Hypertension, aortic stenosis, COPD, CAD, stroke. EXAM: PORTABLE CHEST 1 VIEW COMPARISON:  Chest x-ray 03/15/2021, CT chest 02/13/2021 FINDINGS: The heart size and mediastinal contours are unchanged. Aortic calcification. Cardiac surgical changes overlie the mediastinum. Aortic valve replacement. No focal consolidation. No pulmonary edema. No definite pleural effusion. No pneumothorax. No acute osseous abnormality. Partially visualized reverse total right shoulder arthroplasty. Moderate to severe degenerative changes of the left glenohumeral joint. IMPRESSION: No active disease. Electronically Signed   By: Iven Finn M.D.   On: 03/26/2021 16:37     Assessment and Plan:   Shortness of breath: -Discussed the need to take Trelegy daily with the patient and his son, his son will make sure this happens - Although his BNP has some elevation, there is no active disease on his chest x-ray, no JVD and only a few rales in his bases that are likely chronic - According to his his son, his breathing is about like it normally is at this time.  However, his son was not present during most of his acute shortness of  breath so it is not clear if he was having a COPD exacerbation from even a short time outside  2.  CAD: -Cath report from March 2022 as above - He has a diagonal 1 and an OM1 that are not revascularized.  He does have a substrate for some small vessel disease and the shortness of breath could have been an anginal equivalent - Check troponin  3.  Severe aortic stenosis S/P TAVR 03/03/2021 - With recent TAVR, check an echo  4.  CKD 3 -His creatinine is above baseline at 1.63, it is generally between 1.2 and 1.4 -Recheck in a.m.  5.  COPD - He has nebulizers at home, and Trelegy. - However, he does not have a rescue inhaler - Suggested to his son that they discussed this with the doctor that prescribes the nebulizers. - He has an appointment with Dr. Valeta Harms in September, they will try to move it up.   Risk Assessment/Risk Scores:     New York Heart Association (NYHA) Functional Class NYHA Class III   For questions or updates, please contact [facility] Please consult [email or web address] for contact info under    Signed, Blake Ferries, PA-C  03/26/2021 7:21 PM  Patient seen and examined with Blake Ferries PA-C.  Agree as above, with the following exceptions and changes as noted below. 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 Presents with acute onset SOB after driving to [facility] and back with his family. They stopped at [facility] for lunch and he had a salty meal - burger and fries. Notes acute onset shortness of breath within minutes per his recollection. Has been taking lasix 40 mg daily. In ED BNP elevated and renal function shows AKI. Nitroglycerin did relieve his SOB. Gen: NAD, CV: RRR, no murmur, Lungs: coarse bilateral breath sounds, Abd: soft, Extrem: Warm, well perfused, no edema, Neuro/Psych: alert and oriented x 3, normal mood and affect. All  available labs, radiology testing, previous records reviewed. Unclear what provoked this episode but BNP is elevated. He may be mildly decompensated vs . COPD exacerbation. He very much wants to go home.  I have requested he stay for overnight observation and consider obtaining his 1 mo post tavr echo in hospital, and so that I could give one dose of IV lasix. He really wants to sleep in his own bed and feels comfortable going home. We will trend troponins given nitro responsive episode of SOB. He may need to double lasix tomorrow when he is home, however renal function is not optimal and may need labs on Monday if he does so. Will reach out to structural team in AM to help coordinate.   ADDENDUM: delta trops grossly unremarkable. Likely safe for discharge but needs close follow up. Arraged for 8/12 with EP already.   Elouise Munroe, MD 03/26/21 10:29 PM

## 2021-03-26 NOTE — ED Triage Notes (Signed)
Came in for shortness of breath. Aortic valve replacement on July 12 and currently is due to see cardiologist for pacemaker issues. O2 at 100% on 2LPM. 2 breathing treatments at home prior to coming to ED. Alert and oriented x 4.

## 2021-04-03 ENCOUNTER — Ambulatory Visit: Payer: Medicare HMO | Admitting: Internal Medicine

## 2021-04-03 ENCOUNTER — Other Ambulatory Visit: Payer: Self-pay

## 2021-04-03 VITALS — BP 124/42 | HR 45 | Ht 69.0 in | Wt 160.0 lb

## 2021-04-03 DIAGNOSIS — I2510 Atherosclerotic heart disease of native coronary artery without angina pectoris: Secondary | ICD-10-CM

## 2021-04-03 DIAGNOSIS — Z950 Presence of cardiac pacemaker: Secondary | ICD-10-CM

## 2021-04-03 DIAGNOSIS — I442 Atrioventricular block, complete: Secondary | ICD-10-CM | POA: Diagnosis not present

## 2021-04-03 NOTE — Progress Notes (Signed)
HPI Mr. Kornreich returns today for followup. He is a pleasant 87 yo man with surgical AS as well as heart block, s/p PPM insertion. He has developed worsening sob and chest pressure. He is s/p TAVR. He has been found to have a malfunctioning atrial lead with an elevated pacing impedence as well as noise on his lead. The patient has been sedentary for over a year. He has PAF. He has not had syncope.  No Known Allergies   Current Outpatient Medications  Medication Sig Dispense Refill   albuterol (PROVENTIL) (2.5 MG/3ML) 0.083% nebulizer solution Take 3 mLs (2.5 mg total) by nebulization every 6 (six) hours as needed for wheezing or shortness of breath. 75 mL 12   amLODipine (NORVASC) 2.5 MG tablet Take 2.5 mg by mouth in the morning.     apixaban (ELIQUIS) 5 MG TABS tablet TAKE 1 TABLET BY MOUTH TWICE A DAY 60 tablet 5   aspirin 81 MG chewable tablet Chew 1 tablet (81 mg total) by mouth daily. 60 tablet 2   atorvastatin (LIPITOR) 40 MG tablet Take 40 mg by mouth in the morning.     cyanocobalamin (,VITAMIN B-12,) 1000 MCG/ML injection Inject 1,000 mcg into the muscle every 30 (thirty) days.     donepezil (ARICEPT) 10 MG tablet Take 10 mg by mouth at bedtime.     enalapril (VASOTEC) 20 MG tablet Take 20 mg by mouth 2 (two) times daily.     Fluticasone-Umeclidin-Vilant (TRELEGY ELLIPTA) 100-62.5-25 MCG/INH AEPB Inhale 1 puff into the lungs daily. Rinse mouth after 60 each 1   furosemide (LASIX) 40 MG tablet Take 40 mg by mouth in the morning.     isosorbide mononitrate (IMDUR) 60 MG 24 hr tablet Take 1 tablet (60 mg total) by mouth daily. 90 tablet 3   Lidocaine 4 % PTCH Apply 1 patch topically daily as needed (pain).     metoprolol succinate (TOPROL-XL) 50 MG 24 hr tablet Take 1 tablet (50 mg total) by mouth daily. 90 tablet 3   nitroGLYCERIN (NITROSTAT) 0.4 MG SL tablet Place 1 tablet (0.4 mg total) under the tongue every 5 (five) minutes as needed for chest pain. 25 tablet 3   omeprazole  (PRILOSEC) 20 MG capsule Take 20 mg by mouth in the morning.     tamsulosin (FLOMAX) 0.4 MG CAPS capsule Take 0.4 mg by mouth in the morning.     No current facility-administered medications for this visit.     Past Medical History:  Diagnosis Date   Aortic stenosis    mild AS 09/2017 echo   Cancer [facility])    skin   COPD (chronic obstructive pulmonary disease) (HCC)    Stage 3 per patient's son   Coronary artery disease    a.  s/p CABG;   b. cath 4/12: EF 55%, 3vCAD, patent L-LAD, patent S-RCA, patent S-CFX (done after a false pos. ETT)   Dementia ([place])    Per son   Diverticular disease    GERD (gastroesophageal reflux disease)    GI bleed    Hemorrhoids    HH (hiatus hernia)    History of kidney stones    Hypertension    Osteoarthritis    Other and unspecified hyperlipidemia    Presence of permanent cardiac pacemaker    Schatzki's ring    Stroke ([place])     ROS:   All systems reviewed and negative except as noted in the HPI.   Past Surgical History:  Procedure Laterality Date   ARTERIOVENOUS GRAFT PLACEMENT W/ ENDOSCOPIC VEIN HARVEST     of the right leg greater spahenous vein. Surgeon: Tharon Aquas Trigt,M.D.   BACK SURGERY  2017   COLONOSCOPY  02/24/2010   Hemorrhoids, Diverticulosis. Performed at [place]. Normal terminal ileum. Dr. June Leap, [place] ARTERY BYPASS GRAFT  06/21/2007   CABG x 3 Surgeon Ivin Poot, MD   EYE SURGERY     bilateral cataract removal   hip replace  06/09/2004   left hip Surgeon Pietro Cassis. Alvan Dame, MD   INTRAOPERATIVE TRANSTHORACIC ECHOCARDIOGRAM Left 03/03/2021   Procedure: INTRAOPERATIVE TRANSTHORACIC ECHOCARDIOGRAM;  Surgeon: Burnell Blanks, MD;  Location: [place];  Service: Open Heart Surgery;  Laterality: Left;   LAPAROSCOPIC CHOLECYSTECTOMY  2021   LEFT HEART CATH AND CORS/GRAFTS ANGIOGRAPHY N/A 11/04/2020   Procedure: LEFT HEART CATH AND CORS/GRAFTS ANGIOGRAPHY;  Surgeon: Troy Sine, MD;  Location: [place]  CV LAB;  Service: Cardiovascular;  Laterality: N/A;   MULTIPLE EXTRACTIONS WITH ALVEOLOPLASTY N/A 02/03/2021   Procedure: MULTIPLE EXTRACTION WITH ALVEOLOPLASTY;  Surgeon: Charlaine Dalton, DMD;  Location: [place];  Service: Dentistry;  Laterality: N/A;   PACEMAKER IMPLANT N/A 10/03/2017   Procedure: PACEMAKER IMPLANT;  Surgeon: Evans Lance, MD;  Location: [place] CV LAB;  Service: Cardiovascular;  Laterality: N/A;   REVERSE SHOULDER ARTHROPLASTY Right 08/25/2018   Procedure: REVERSE SHOULDER ARTHROPLASTY;  Surgeon: Netta Cedars, MD;  Location: [place];  Service: Orthopedics;  Laterality: Right;   TRANSCATHETER AORTIC VALVE REPLACEMENT, TRANSFEMORAL Bilateral 03/03/2021   Procedure: TRANSCATHETER AORTIC VALVE REPLACEMENT, TRANSFEMORAL;  Surgeon: Burnell Blanks, MD;  Location: [place];  Service: Open Heart Surgery;  Laterality: Bilateral;   ULTRASOUND GUIDANCE FOR VASCULAR ACCESS Bilateral 03/03/2021   Procedure: ULTRASOUND GUIDANCE FOR VASCULAR ACCESS;  Surgeon: Burnell Blanks, MD;  Location: [place];  Service: Open Heart Surgery;  Laterality: Bilateral;     Family History  Problem Relation Age of Onset   Heart attack Mother    Hypertension Mother    Diabetes Father    Diabetes Brother    Diabetes Sister      Social History   Socioeconomic History   Marital status: Married    Spouse name: Not on file   Number of children: 2   Years of education: Not on file   Highest education level: Not on file  Occupational History   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 Daily Caffeine: 24 oz pepsi and 1 cup coffee.    Social Determinants of Health   Financial Resource Strain:  Not on file  Food Insecurity: Not on file  Transportation Needs: Not on file  Physical Activity: Not on file  Stress: Not on file  Social Connections: Not on file  Intimate Partner Violence: Not on file     BP (!) 124/42   Pulse (!) 45   Ht '5\' 9"'$  (1.753 m)   Wt 160 lb (72.6 kg)   SpO2 96%   BMI 23.63 kg/m   Physical Exam:  Well appearing NAD HEENT: Unremarkable Neck:  No JVD, no thyromegally Lymphatics:  No adenopathy Back:  No CVA tenderness Lungs:  Clear with no wheezes HEART:  Regular rate rhythm, no murmurs, no rubs, no clicks Abd:  soft, positive bowel sounds, no organomegally, no rebound, no guarding Ext:  2 plus pulses, no edema, no cyanosis, no clubbing Skin:  No rashes no nodules Neuro:  CN II through XII intact, motor grossly intact  DEVICE  Atrial lead disfunction.  See PaceArt for details.   Assess/Plan:  1. CHB - he is pacing 97% of the time. He has a paced QRS of under 100.  2. Atrial lead dysfunction -His impedence is high and he is having noise causing inhibition.  3. AS - he is s/p TAVR 4. CAD - he has been stable.   Carleene Overlie Johnryan Sao,MD

## 2021-04-03 NOTE — Patient Instructions (Addendum)
Medication Instructions:  Your physician recommends that you continue on your current medications as directed. Please refer to the Current Medication list given to you today.  Labwork: None ordered.  Testing/Procedures: None ordered.  Follow-Up:  SEE INSTRUCTION LETTER  Any Other Special Instructions Will Be Listed Below (If Applicable).  If you need a refill on your cardiac medications before your next appointment, please call your pharmacy.    

## 2021-04-03 NOTE — H&P (View-Only) (Signed)
HPI Blake Burgess returns today for followup. He is a pleasant 87 yo man with surgical AS as well as heart block, s/p PPM insertion. He has developed worsening sob and chest pressure. He is s/p TAVR. He has been found to have a malfunctioning atrial lead with an elevated pacing impedence as well as noise on his lead. The patient has been sedentary for over a year. He has PAF. He has not had syncope.  No Known Allergies   Current Outpatient Medications  Medication Sig Dispense Refill   albuterol (PROVENTIL) (2.5 MG/3ML) 0.083% nebulizer solution Take 3 mLs (2.5 mg total) by nebulization every 6 (six) hours as needed for wheezing or shortness of breath. 75 mL 12   amLODipine (NORVASC) 2.5 MG tablet Take 2.5 mg by mouth in the morning.     apixaban (ELIQUIS) 5 MG TABS tablet TAKE 1 TABLET BY MOUTH TWICE A DAY 60 tablet 5   aspirin 81 MG chewable tablet Chew 1 tablet (81 mg total) by mouth daily. 60 tablet 2   atorvastatin (LIPITOR) 40 MG tablet Take 40 mg by mouth in the morning.     cyanocobalamin (,VITAMIN B-12,) 1000 MCG/ML injection Inject 1,000 mcg into the muscle every 30 (thirty) days.     donepezil (ARICEPT) 10 MG tablet Take 10 mg by mouth at bedtime.     enalapril (VASOTEC) 20 MG tablet Take 20 mg by mouth 2 (two) times daily.     Fluticasone-Umeclidin-Vilant (TRELEGY ELLIPTA) 100-62.5-25 MCG/INH AEPB Inhale 1 puff into the lungs daily. Rinse mouth after 60 each 1   furosemide (LASIX) 40 MG tablet Take 40 mg by mouth in the morning.     isosorbide mononitrate (IMDUR) 60 MG 24 hr tablet Take 1 tablet (60 mg total) by mouth daily. 90 tablet 3   Lidocaine 4 % PTCH Apply 1 patch topically daily as needed (pain).     metoprolol succinate (TOPROL-XL) 50 MG 24 hr tablet Take 1 tablet (50 mg total) by mouth daily. 90 tablet 3   nitroGLYCERIN (NITROSTAT) 0.4 MG SL tablet Place 1 tablet (0.4 mg total) under the tongue every 5 (five) minutes as needed for chest pain. 25 tablet 3   omeprazole  (PRILOSEC) 20 MG capsule Take 20 mg by mouth in the morning.     tamsulosin (FLOMAX) 0.4 MG CAPS capsule Take 0.4 mg by mouth in the morning.     No current facility-administered medications for this visit.     Past Medical History:  Diagnosis Date   Aortic stenosis    mild AS 09/2017 echo   Cancer [facility])    skin   COPD (chronic obstructive pulmonary disease) (HCC)    Stage 3 per patient's son   Coronary artery disease    a.  s/p CABG;   b. cath 4/12: EF 55%, 3vCAD, patent L-LAD, patent S-RCA, patent S-CFX (done after a false pos. ETT)   Dementia ([place])    Per son   Diverticular disease    GERD (gastroesophageal reflux disease)    GI bleed    Hemorrhoids    HH (hiatus hernia)    History of kidney stones    Hypertension    Osteoarthritis    Other and unspecified hyperlipidemia    Presence of permanent cardiac pacemaker    Schatzki's ring    Stroke ([place])     ROS:   All systems reviewed and negative except as noted in the HPI.   Past Surgical History:  Procedure Laterality Date   ARTERIOVENOUS GRAFT PLACEMENT W/ ENDOSCOPIC VEIN HARVEST     of the right leg greater spahenous vein. Surgeon: Tharon Aquas Trigt,M.D.   BACK SURGERY  2017   COLONOSCOPY  02/24/2010   Hemorrhoids, Diverticulosis. Performed at [place]. Normal terminal ileum. Dr. June Leap, [place] ARTERY BYPASS GRAFT  06/21/2007   CABG x 3 Surgeon Ivin Poot, MD   EYE SURGERY     bilateral cataract removal   hip replace  06/09/2004   left hip Surgeon Pietro Cassis. Alvan Dame, MD   INTRAOPERATIVE TRANSTHORACIC ECHOCARDIOGRAM Left 03/03/2021   Procedure: INTRAOPERATIVE TRANSTHORACIC ECHOCARDIOGRAM;  Surgeon: Burnell Blanks, MD;  Location: [place];  Service: Open Heart Surgery;  Laterality: Left;   LAPAROSCOPIC CHOLECYSTECTOMY  2021   LEFT HEART CATH AND CORS/GRAFTS ANGIOGRAPHY N/A 11/04/2020   Procedure: LEFT HEART CATH AND CORS/GRAFTS ANGIOGRAPHY;  Surgeon: Troy Sine, MD;  Location: [place]  CV LAB;  Service: Cardiovascular;  Laterality: N/A;   MULTIPLE EXTRACTIONS WITH ALVEOLOPLASTY N/A 02/03/2021   Procedure: MULTIPLE EXTRACTION WITH ALVEOLOPLASTY;  Surgeon: Charlaine Dalton, DMD;  Location: [place];  Service: Dentistry;  Laterality: N/A;   PACEMAKER IMPLANT N/A 10/03/2017   Procedure: PACEMAKER IMPLANT;  Surgeon: Evans Lance, MD;  Location: [place] CV LAB;  Service: Cardiovascular;  Laterality: N/A;   REVERSE SHOULDER ARTHROPLASTY Right 08/25/2018   Procedure: REVERSE SHOULDER ARTHROPLASTY;  Surgeon: Netta Cedars, MD;  Location: [place];  Service: Orthopedics;  Laterality: Right;   TRANSCATHETER AORTIC VALVE REPLACEMENT, TRANSFEMORAL Bilateral 03/03/2021   Procedure: TRANSCATHETER AORTIC VALVE REPLACEMENT, TRANSFEMORAL;  Surgeon: Burnell Blanks, MD;  Location: [place];  Service: Open Heart Surgery;  Laterality: Bilateral;   ULTRASOUND GUIDANCE FOR VASCULAR ACCESS Bilateral 03/03/2021   Procedure: ULTRASOUND GUIDANCE FOR VASCULAR ACCESS;  Surgeon: Burnell Blanks, MD;  Location: [place];  Service: Open Heart Surgery;  Laterality: Bilateral;     Family History  Problem Relation Age of Onset   Heart attack Mother    Hypertension Mother    Diabetes Father    Diabetes Brother    Diabetes Sister      Social History   Socioeconomic History   Marital status: Married    Spouse name: Not on file   Number of children: 2   Years of education: Not on file   Highest education level: Not on file  Occupational History   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 Daily Caffeine: 24 oz pepsi and 1 cup coffee.    Social Determinants of Health   Financial Resource Strain:  Not on file  Food Insecurity: Not on file  Transportation Needs: Not on file  Physical Activity: Not on file  Stress: Not on file  Social Connections: Not on file  Intimate Partner Violence: Not on file     BP (!) 124/42   Pulse (!) 45   Ht '5\' 9"'$  (1.753 m)   Wt 160 lb (72.6 kg)   SpO2 96%   BMI 23.63 kg/m   Physical Exam:  Well appearing NAD HEENT: Unremarkable Neck:  No JVD, no thyromegally Lymphatics:  No adenopathy Back:  No CVA tenderness Lungs:  Clear with no wheezes HEART:  Regular rate rhythm, no murmurs, no rubs, no clicks Abd:  soft, positive bowel sounds, no organomegally, no rebound, no guarding Ext:  2 plus pulses, no edema, no cyanosis, no clubbing Skin:  No rashes no nodules Neuro:  CN II through XII intact, motor grossly intact  DEVICE  Atrial lead disfunction.  See PaceArt for details.   Assess/Plan:  1. CHB - he is pacing 97% of the time. He has a paced QRS of under 100.  2. Atrial lead dysfunction -His impedence is high and he is having noise causing inhibition.  3. AS - he is s/p TAVR 4. CAD - he has been stable.   Blake Overlie Devonna Oboyle,MD

## 2021-04-07 ENCOUNTER — Telehealth: Payer: Self-pay

## 2021-04-07 DIAGNOSIS — T82110A Breakdown (mechanical) of cardiac electrode, initial encounter: Secondary | ICD-10-CM

## 2021-04-07 DIAGNOSIS — I442 Atrioventricular block, complete: Secondary | ICD-10-CM

## 2021-04-07 NOTE — Telephone Encounter (Signed)
Discussed with son.  Pt scheduled for lead extraction and replacement on April 30, 2021 at 3:00 pm.  Will get lab work 04/08/21 at office visit.  Covid test instructions given.  Work up complete.

## 2021-04-08 ENCOUNTER — Other Ambulatory Visit: Payer: Self-pay

## 2021-04-08 ENCOUNTER — Other Ambulatory Visit: Payer: Medicare HMO | Admitting: *Deleted

## 2021-04-08 ENCOUNTER — Encounter: Payer: Self-pay | Admitting: Physician Assistant

## 2021-04-08 ENCOUNTER — Ambulatory Visit: Payer: Medicare HMO | Admitting: Physician Assistant

## 2021-04-08 ENCOUNTER — Ambulatory Visit (HOSPITAL_COMMUNITY): Payer: Medicare HMO | Attending: Cardiology

## 2021-04-08 VITALS — BP 120/72 | HR 82 | Ht 69.0 in | Wt 160.6 lb

## 2021-04-08 DIAGNOSIS — I2510 Atherosclerotic heart disease of native coronary artery without angina pectoris: Secondary | ICD-10-CM | POA: Diagnosis not present

## 2021-04-08 DIAGNOSIS — T82110A Breakdown (mechanical) of cardiac electrode, initial encounter: Secondary | ICD-10-CM | POA: Diagnosis not present

## 2021-04-08 DIAGNOSIS — I5032 Chronic diastolic (congestive) heart failure: Secondary | ICD-10-CM

## 2021-04-08 DIAGNOSIS — I350 Nonrheumatic aortic (valve) stenosis: Secondary | ICD-10-CM

## 2021-04-08 DIAGNOSIS — N1831 Chronic kidney disease, stage 3a: Secondary | ICD-10-CM

## 2021-04-08 DIAGNOSIS — I442 Atrioventricular block, complete: Secondary | ICD-10-CM

## 2021-04-08 DIAGNOSIS — I10 Essential (primary) hypertension: Secondary | ICD-10-CM | POA: Diagnosis not present

## 2021-04-08 DIAGNOSIS — I480 Paroxysmal atrial fibrillation: Secondary | ICD-10-CM

## 2021-04-08 DIAGNOSIS — Z952 Presence of prosthetic heart valve: Secondary | ICD-10-CM | POA: Diagnosis present

## 2021-04-08 LAB — ECHOCARDIOGRAM COMPLETE
AR max vel: 1.25 cm2
AV Area VTI: 1.37 cm2
AV Area mean vel: 1.33 cm2
AV Mean grad: 10 mmHg
AV Peak grad: 19.3 mmHg
Ao pk vel: 2.2 m/s
Area-P 1/2: 1.97 cm2
S' Lateral: 3.1 cm

## 2021-04-08 NOTE — Progress Notes (Signed)
HEART AND [place]                                     Cardiology Office Note:    Date:  04/08/2021   ID:  Blake Burgess, DOB January 21, 1932, MRN [id number]  PCP:  Raelene Bott, MD  [facility] Cardiologist:  Minus Breeding, MD / Dr. Angelena Form & Dr. Roxy Manns (TAVR) [facility] Electrophysiologist:  None   Referring MD: Raelene Bott, MD   1 month s/p TAVR  History of Present Illness:    Blake Burgess is a 87 y.o. male with a hx of multivessel CAD s/p CABG in the remote past, HTN, severe COPD (due to chicken farming), PAF on Eliquis, CHB s/ PPM in 2019, ICM with chronic systolic CHF, previous stroke, GERD, hiatal hernia and severe aortic stenosis s/p TAVR (03/03/21) who presents to clinic for follow up.   Patient's cardiac history dates back to 2008 when he underwent coronary artery bypass grafting by Dr. Darcey Nora.  He has been followed for many years by Dr. Percival Spanish.  Patient has chronic exertional shortness of breath that has progressed.  He now gets short of breath with very low level activity.  Previous echocardiograms have documented the presence of moderately decreased left ventricular systolic function with moderate aortic stenosis.  The patient underwent diagnostic cardiac catheterization in March 2022 which revealed severe native coronary artery disease but continued patency of all bypass grafts involving all 3 vascular territories.  Follow-up transthoracic echocardiogram performed December 10, 2020 revealed some progression in the severity of the patient's aortic stenosis.  Left ventricular ejection fraction was estimated 45 to 50%.  The aortic valve was trileaflet with severe thickening and restricted leaflet mobility involving all 3 leaflets.  Peak velocity across aortic valve measured 3.2 m/s corresponding to mean transvalvular gradient estimated 21.9 mmHg and aortic valve area calculated 1.09 cm by VTI.  The DVI was notably 0.24 and  stroke-volume index 47.  The patient has been seen in consultation previously by Dr. Angelena Form.  CT angiography was performed and the patient was referred for surgical consultation. He was evaluated recently by Dr. 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  The patient has never been a smoker.  She was evaluated by the multidisciplinary valve team and underwent a successful TAVR with a 26 mm Edwards Sapien 3 Ultra THV via the TF approach on 03/03/21. Post operative echo showed EF 50%, normally functioning TAVR with a mean gradient of 10 mmHg and no PVL. EP was asked to see the patient for device interrogation and intermittent HRs in the 40's. Per their note, patient has known atrial lead issues (senses OK) with eventual plans for lead revision. On their telemetry review, he was in Sinus Brady/NSR in 50-70s with occasional RV (HIS bundle) pacing. 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 Suspected that sinus node was stunned post op resulting in him intermittently pacing at his lower limit. No new recommendations were made and he has an apt with Dr. Lovena Le on 8/12 to discuss lead revision. He was discharged on Eliquis and the addition of a baby aspirin x 6 months. He was seen in the ER on 03/26/21 with shortness of breath. In ED BNP elevated and renal function shows AKI. Nitroglycerin  did relieve his SOB. It was felt to be mildly decompensated vs . COPD exacerbation. He very much wants to go home and was discharged home after he rule out for MI. He was treated with one dose of IV lasix.   He saw Dr. Lovena Le on 8/12 and was set up for left atrial lead revision on 04/30/21.  Today the patient presents to clinic for follow up. Patient is here with son. He still has significant dyspnea on exertion. The only time he feels okay is when heis laying flat. He occasionally has some LE edema. No PND.  He frequently has dizziness but no syncope. He has fatigue all the time.  Past Medical History:  Diagnosis Date   Aortic stenosis    mild AS 09/2017 echo   Cancer [facility])    skin   COPD (chronic obstructive pulmonary disease) (HCC)    Stage 3 per patient's son   Coronary artery disease    a.  s/p CABG;   b. cath 4/12: EF 55%, 3vCAD, patent L-LAD, patent S-RCA, patent S-CFX (done after a false pos. ETT)   Dementia [facility])    Per son   Diverticular disease    GERD (gastroesophageal reflux disease)    GI bleed    Hemorrhoids    HH (hiatus hernia)    History of kidney stones    Hypertension    Osteoarthritis    Other and unspecified hyperlipidemia    Presence of permanent cardiac pacemaker    Schatzki's ring    Stroke [facility])     Past Surgical History:  Procedure Laterality Date   ARTERIOVENOUS GRAFT PLACEMENT W/ ENDOSCOPIC VEIN HARVEST     of the right leg greater spahenous vein. Surgeon: Tharon Aquas Trigt,M.D.   BACK SURGERY  2017   COLONOSCOPY  02/24/2010   Hemorrhoids, Diverticulosis. Performed at [facility]. Normal terminal ileum. Dr. June Leap, [place] ARTERY BYPASS GRAFT  06/21/2007   CABG x 3 Surgeon Ivin Poot, MD   EYE SURGERY     bilateral cataract removal   hip replace  06/09/2004   left hip Surgeon Pietro Cassis. Alvan Dame, MD   INTRAOPERATIVE TRANSTHORACIC ECHOCARDIOGRAM Left 03/03/2021   Procedure: INTRAOPERATIVE TRANSTHORACIC ECHOCARDIOGRAM;  Surgeon: Burnell Blanks, MD;  Location: [place];  Service: Open Heart Surgery;  Laterality: Left;   LAPAROSCOPIC CHOLECYSTECTOMY  2021   LEFT HEART CATH AND CORS/GRAFTS ANGIOGRAPHY N/A 11/04/2020   Procedure: LEFT HEART CATH AND CORS/GRAFTS ANGIOGRAPHY;  Surgeon: Troy Sine, MD;  Location: [place] CV LAB;  Service: Cardiovascular;  Laterality: N/A;   MULTIPLE EXTRACTIONS WITH ALVEOLOPLASTY N/A 02/03/2021   Procedure: MULTIPLE EXTRACTION WITH ALVEOLOPLASTY;  Surgeon: Charlaine Dalton, DMD;  Location: [place];   Service: Dentistry;  Laterality: N/A;   PACEMAKER IMPLANT N/A 10/03/2017   Procedure: PACEMAKER IMPLANT;  Surgeon: Evans Lance, MD;  Location: [place] CV LAB;  Service: Cardiovascular;  Laterality: N/A;   REVERSE SHOULDER ARTHROPLASTY Right 08/25/2018   Procedure: REVERSE SHOULDER ARTHROPLASTY;  Surgeon: Netta Cedars, MD;  Location: [place];  Service: Orthopedics;  Laterality: Right;   TRANSCATHETER AORTIC VALVE REPLACEMENT, TRANSFEMORAL Bilateral 03/03/2021   Procedure: TRANSCATHETER AORTIC VALVE REPLACEMENT, TRANSFEMORAL;  Surgeon: Burnell Blanks, MD;  Location: [place];  Service: Open Heart Surgery;  Laterality: Bilateral;   ULTRASOUND GUIDANCE FOR VASCULAR ACCESS Bilateral 03/03/2021   Procedure: ULTRASOUND GUIDANCE FOR VASCULAR ACCESS;  Surgeon: Burnell Blanks, MD;  Location: [place];  Service: Open Heart Surgery;  Laterality:  Bilateral;    Current Medications: Current Meds  Medication Sig   albuterol (PROVENTIL) (2.5 MG/3ML) 0.083% nebulizer solution Take 3 mLs (2.5 mg total) by nebulization every 6 (six) hours as needed for wheezing or shortness of breath.   amLODipine (NORVASC) 2.5 MG tablet Take 2.5 mg by mouth in the morning.   apixaban (ELIQUIS) 5 MG TABS tablet TAKE 1 TABLET BY MOUTH TWICE A DAY   aspirin 81 MG chewable tablet Chew 1 tablet (81 mg total) by mouth daily.   atorvastatin (LIPITOR) 40 MG tablet Take 40 mg by mouth in the morning.   cyanocobalamin (,VITAMIN B-12,) 1000 MCG/ML injection Inject 1,000 mcg into the muscle every 30 (thirty) days.   donepezil (ARICEPT) 10 MG tablet Take 10 mg by mouth at bedtime.   enalapril (VASOTEC) 20 MG tablet Take 20 mg by mouth 2 (two) times daily.   Fluticasone-Umeclidin-Vilant (TRELEGY ELLIPTA) 100-62.5-25 MCG/INH AEPB Inhale 1 puff into the lungs daily. Rinse mouth after   furosemide (LASIX) 40 MG tablet Take 40 mg by mouth in the morning.   isosorbide mononitrate (IMDUR) 60 MG 24 hr tablet Take 1 tablet (60 mg  total) by mouth daily.   Lidocaine 4 % PTCH Apply 1 patch topically daily as needed (pain).   metoprolol succinate (TOPROL-XL) 50 MG 24 hr tablet Take 1 tablet (50 mg total) by mouth daily.   nitroGLYCERIN (NITROSTAT) 0.4 MG SL tablet Place 1 tablet (0.4 mg total) under the tongue every 5 (five) minutes as needed for chest pain.   omeprazole (PRILOSEC) 20 MG capsule Take 20 mg by mouth in the morning.   tamsulosin (FLOMAX) 0.4 MG CAPS capsule Take 0.4 mg by mouth in the morning.     Allergies:   Patient has no known allergies.   Social History   Socioeconomic History   Marital status: Married    Spouse name: Not on file   Number of children: 2   Years of education: Not on file   Highest education level: Not on file  Occupational History   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 Daily Caffeine: 24 oz pepsi and 1 cup coffee.    Social Determinants of Health   Financial Resource Strain: Not on file  Food Insecurity: Not on file  Transportation Needs: Not on file  Physical Activity: Not on file  Stress: Not on file  Social Connections: Not on file     Family History: The patient's family history includes Diabetes in his brother, father, and sister; Heart attack in his mother; Hypertension in his mother.  ROS:   Please see the history of present illness.    All other systems reviewed and are negative.  EKGs/Labs/Other Studies Reviewed:    The following studies were reviewed today:  TAVR 03/03/21:     Date of Procedure:                03/03/2021   Preoperative Diagnosis:      Severe Aortic Stenosis   Postoperative Diagnosis:    Same   Procedure:        Transcatheter Aortic  Valve Replacement - Transfemoral Approach  Edwards Sapien 3 THV (size 26 mm, model # [id number], serial # [id number])              Co-Surgeons:                        Lauree Chandler, MD and Valentina Gu. Roxy Manns, MD   Anesthesiologist:                  Kalman Shan   Echocardiographer:              Johnsie Cancel   Pre-operative Echo Findings: Severe aortic stenosis Normal left ventricular systolic function   Post-operative Echo Findings: No paravalvular leak Normal left ventricular systolic function   _________________________   Echo 03/04/21 IMPRESSIONS   1. Abnormal septal motion . Left ventricular ejection fraction, by  estimation, is 50 to 55%. The left ventricle has low normal function. The  left ventricle has no regional wall motion abnormalities. There is mild  left ventricular hypertrophy. Left  ventricular diastolic parameters are consistent with Grade I diastolic  dysfunction (impaired relaxation).   2. Pacing wires in RA/RV . Right ventricular systolic function is normal.  The right ventricular size is normal. There is normal pulmonary artery  systolic pressure.   3. Left atrial size was moderately dilated.   4. Right atrial size was moderately dilated.   5. The mitral valve is degenerative. Mild mitral valve regurgitation. No  evidence of mitral stenosis. Moderate mitral annular calcification.   6. Post TAVR 03/03/21 Well placed 26 mm Sapien 3 valve No PVL mean  gradient 10 peak 15 mmHg AVA 2.5 cm2. The aortic valve has been  repaired/replaced. Aortic valve regurgitation is not visualized. No aortic  stenosis is present. Procedure Date: 03/03/2021.   7. The inferior vena cava is normal in size with greater than 50%  respiratory variability, suggesting right atrial pressure of 3 mmHg.   ___________________________  Echo 04/08/21 IMPRESSIONS   1. Left ventricular ejection fraction, by estimation, is 55 to 60%. The  left ventricle has normal function. The left ventricle  has no regional  wall motion abnormalities. Left ventricular diastolic parameters are  consistent with Grade I diastolic  dysfunction (impaired relaxation).   2. Right ventricular systolic function is normal. The right ventricular  size is normal. There is normal pulmonary artery systolic pressure. The  estimated right ventricular systolic pressure is 99991111 mmHg.   3. Left atrial size was moderately dilated.   4. Right atrial size was mildly dilated.   5. The mitral valve is degenerative. Mild mitral valve regurgitation. No  evidence of mitral stenosis. Moderate mitral annular calcification.   6. Tricuspid valve regurgitation is mild to moderate.   7. The aortic valve has been repaired/replaced. Aortic valve  regurgitation is not visualized. There is a 26 mm Sapien prosthetic (TAVR)  valve present in the aortic position. Procedure Date: 03/03/2021. Aortic  valve mean gradient measures 10.0 mmHg.  Aortic valve Vmax measures 2.20 m/s. Aortic valve acceleration time  measures 90 msec   EKG:  EKG is NOT ordered today.   Recent Labs: 02/27/2021: ALT 15 03/04/2021: Magnesium 2.0 03/26/2021: B Natriuretic Peptide 409.8; BUN 23; Creatinine, Ser 1.63; Hemoglobin 10.1; Platelets 222; Potassium 4.2; Sodium 137  Recent Lipid Panel    Component Value Date/Time   CHOL 112 10/01/2017 0619   TRIG 68 10/01/2017 0619   HDL 39 (L) 10/01/2017 0619   CHOLHDL 2.9 10/01/2017 0619   VLDL 14 10/01/2017 [id number]  [place] 59 10/01/2017 0619     Risk Assessment/Calculations:    CHA2DS2-VASc Score = 7  This indicates a 11.2% annual risk of stroke. The patient's score is based upon: CHF History: Yes HTN History: Yes Diabetes History: No Stroke History: Yes Vascular Disease History: Yes Age Score: 2 Gender Score: 0    Physical Exam:    VS:  BP 120/72   Pulse 82   Ht '5\' 9"'$  (1.753 m)   Wt 160 lb 9.6 oz (72.8 kg)   SpO2 97%   BMI 23.72 kg/m     Wt Readings from Last 3 Encounters:  04/08/21 160 lb  9.6 oz (72.8 kg)  04/03/21 160 lb (72.6 kg)  03/26/21 160 lb 11.5 oz (72.9 kg)     GEN:  Well nourished, well developed in no acute distress HEENT: Normal NECK: No JVD;  LYMPHATICS: No lymphadenopathy CARDIAC: RRR, no murmurs, rubs, gallops RESPIRATORY:  Clear to auscultation without rales, wheezing or rhonchi  ABDOMEN: Soft, non-tender, non-distended MUSCULOSKELETAL:  No edema; No deformity  SKIN: Warm and dry . Groin sites clear without hematoma or ecchymosis  NEUROLOGIC:  Alert and oriented x 3 PSYCHIATRIC:  Normal affect   ASSESSMENT:    1. S/P TAVR (transcatheter aortic valve replacement)   2. Failure of pacemaker lead, initial encounter   3. Coronary artery disease involving native coronary artery of native heart without angina pectoris   4. Essential hypertension   5. PAF (paroxysmal atrial fibrillation) ([place])   6. Chronic diastolic CHF (congestive heart failure) (HCC)   7. Stage 3a chronic kidney disease (HCC)     PLAN:    In order of problems listed above:  Severe AS s/p TAVR: echo today shows EF 55%, normally functioning TAVR with a mean gradient of 10 mm hg and no PVL. He has NYHA class III symptoms with significant dyspnea and fatigue. I wonder if some of this is related to his pacemaker dysfunction as his echo tracings show AV disassociation with HRs in the 40-50s. Continue on Eliquis and aspirin. He can stop aspirin after 6 months of treatment ( 08/2021). SBE prophylaxis discussed; he has amoxicillin. I will see him back in 1 year with an echo.   S/p PPM: known atrial lead issues with eventual plans for lead revision on 04/30/21 with Dr. Lovena Le. As above, his echo tracings show AV disassociation with HRs in the 40 and 50s. I am hopeful he will feel better after lead revision.  CAD: pre TAVR cath showed continued patency of all bypass grafts.  Continue medical therapy  HTN: BP well controlled today.  No changes made  PAF: Continue on Eliquis 5 mg twice  daily.  Chronic diastolic CHF: appears euvolemic. Continue current dose of lasix.  CKD stage IIIA: check BMET today   Medication Adjustments/Labs and Tests Ordered: Current medicines are reviewed at length with the patient today.  Concerns regarding medicines are outlined above.  No orders of the defined types were placed in this encounter.  No orders of the defined types were placed in this encounter.    Patient Instructions  Medication Instructions:  No changes for now --- stop aspirin on 09/03/21   Lab Work: Completed (for Dr. Lovena Le)  Testing/Procedures: none   Follow-Up: Please schedule an appointment with Dr. Percival Spanish in about 4 months.  Nell Range, PA-C will see you in one year.  See below for your device follow ups.   Other Instructions See instruction letter for lead extraction and revision given to  you today.    Signed, Angelena Form, PA-C  04/08/2021 8:51 PM    [facility]

## 2021-04-08 NOTE — Patient Instructions (Signed)
Medication Instructions:  No changes for now --- stop aspirin on 09/03/21   Lab Work: Completed (for Dr. Lovena Le)  Testing/Procedures: none   Follow-Up: Please schedule an appointment with Dr. Percival Spanish in about 4 months.  Nell Range, PA-C will see you in one year.  See below for your device follow ups.   Other Instructions See instruction letter for lead extraction and revision given to you today.

## 2021-04-09 LAB — CBC WITH DIFFERENTIAL/PLATELET
Basophils Absolute: 0 10*3/uL (ref 0.0–0.2)
Basos: 0 %
EOS (ABSOLUTE): 0.5 10*3/uL — ABNORMAL HIGH (ref 0.0–0.4)
Eos: 6 %
Hematocrit: 31.8 % — ABNORMAL LOW (ref 37.5–51.0)
Hemoglobin: 10.2 g/dL — ABNORMAL LOW (ref 13.0–17.7)
Immature Grans (Abs): 0 10*3/uL (ref 0.0–0.1)
Immature Granulocytes: 0 %
Lymphocytes Absolute: 3 10*3/uL (ref 0.7–3.1)
Lymphs: 35 %
MCH: 31.5 pg (ref 26.6–33.0)
MCHC: 32.1 g/dL (ref 31.5–35.7)
MCV: 98 fL — ABNORMAL HIGH (ref 79–97)
Monocytes Absolute: 0.8 10*3/uL (ref 0.1–0.9)
Monocytes: 9 %
Neutrophils Absolute: 4.3 10*3/uL (ref 1.4–7.0)
Neutrophils: 50 %
Platelets: 243 10*3/uL (ref 150–450)
RBC: 3.24 x10E6/uL — ABNORMAL LOW (ref 4.14–5.80)
RDW: 13 % (ref 11.6–15.4)
WBC: 8.7 10*3/uL (ref 3.4–10.8)

## 2021-04-09 LAB — BASIC METABOLIC PANEL
BUN/Creatinine Ratio: 22 (ref 10–24)
BUN: 37 mg/dL — ABNORMAL HIGH (ref 8–27)
CO2: 23 mmol/L (ref 20–29)
Calcium: 8.8 mg/dL (ref 8.6–10.2)
Chloride: 106 mmol/L (ref 96–106)
Creatinine, Ser: 1.66 mg/dL — ABNORMAL HIGH (ref 0.76–1.27)
Glucose: 166 mg/dL — ABNORMAL HIGH (ref 65–99)
Potassium: 4.7 mmol/L (ref 3.5–5.2)
Sodium: 140 mmol/L (ref 134–144)
eGFR: 40 mL/min/{1.73_m2} — ABNORMAL LOW (ref >59)

## 2021-04-15 ENCOUNTER — Ambulatory Visit (INDEPENDENT_AMBULATORY_CARE_PROVIDER_SITE_OTHER): Payer: Medicare HMO

## 2021-04-15 DIAGNOSIS — I442 Atrioventricular block, complete: Secondary | ICD-10-CM

## 2021-04-15 LAB — CUP PACEART REMOTE DEVICE CHECK
Battery Remaining Longevity: 76 mo
Battery Voltage: 2.97 V
Brady Statistic AP VP Percent: 11.33 %
Brady Statistic AP VS Percent: 0.85 %
Brady Statistic AS VP Percent: 81.76 %
Brady Statistic AS VS Percent: 6.11 %
Brady Statistic RA Percent Paced: 14.47 %
Brady Statistic RV Percent Paced: 93.07 %
Date Time Interrogation Session: 20220824012109
Implantable Lead Implant Date: 20190211
Implantable Lead Implant Date: 20190211
Implantable Lead Location: 753859
Implantable Lead Location: 753860
Implantable Lead Model: 3830
Implantable Lead Model: 5076
Implantable Pulse Generator Implant Date: 20190211
Lead Channel Impedance Value: 228 Ohm
Lead Channel Impedance Value: 3382 Ohm
Lead Channel Impedance Value: 3382 Ohm
Lead Channel Impedance Value: 342 Ohm
Lead Channel Pacing Threshold Amplitude: 0.5 V
Lead Channel Pacing Threshold Amplitude: 2.5 V
Lead Channel Pacing Threshold Pulse Width: 0.4 ms
Lead Channel Pacing Threshold Pulse Width: 0.4 ms
Lead Channel Sensing Intrinsic Amplitude: 1.75 mV
Lead Channel Sensing Intrinsic Amplitude: 1.75 mV
Lead Channel Sensing Intrinsic Amplitude: 1.75 mV
Lead Channel Sensing Intrinsic Amplitude: 1.75 mV
Lead Channel Setting Pacing Amplitude: 1.75 V
Lead Channel Setting Pacing Amplitude: 2.5 V
Lead Channel Setting Pacing Pulse Width: 0.4 ms
Lead Channel Setting Sensing Sensitivity: 0.6 mV

## 2021-04-28 ENCOUNTER — Other Ambulatory Visit: Payer: Self-pay | Admitting: Internal Medicine

## 2021-04-28 LAB — SARS CORONAVIRUS 2 (TAT 6-24 HRS): SARS Coronavirus 2: NEGATIVE

## 2021-04-29 ENCOUNTER — Encounter (HOSPITAL_COMMUNITY): Payer: Self-pay | Admitting: Internal Medicine

## 2021-04-29 NOTE — Progress Notes (Addendum)
Son [name] can be present with patient in Short Stay on DOS.  Lindsi Forte ok'd this request at patient's PAT appt with Lanelle Bal, Therapist, sports.  Two  VISITORS  MAY VISIT WITH YOU AFTER SURGERY IN YOUR PRIVATE ROOM DURING VISITING HOURS ONLY!  PCP - Dr Raelene Bott [facility] Cardiologist - Dr Jenkins [place] Pacemaker- Dr  Cristopher Peru  Chest x-ray - 03/26/21 (1V) EKG - 03/26/21 Stress Test - 07/29/20 ECHO - 04/08/21 Cardiac Cath - 11/04/20  ICD - Medtronic Pacemaker.  [employer] and Rep [place] notified of surgery date/time. ICD Programming requested and faxed.  Notified OR Desk - [place].  Last remote check was on 04/15/21.  Sleep Study -  n/a CPAP - none  Blood Thinner/Aspirin Instructions:  Follow your surgeon's instructions on when to stop Aspirin and Eliquis prior to surgery.  If no instructions were given by your surgeon then you will need to call the office for those instructions.  Last dose of Eliquis was on 04/27/21 per son.  Son [name] will call MD office for ASA instructions.   ERAS: Clear liquids til 12 noon on DOS.  Reviewed clear liquids with patient.  Anesthesia review: Yes  STOP now taking any Aspirin (unless otherwise instructed by your surgeon), Aleve, Naproxen, Ibuprofen, Motrin, Advil, Goody's, BC's, all herbal medications, fish oil, and all vitamins.   Coronavirus Screening Covid test on 04/28/21 was negative. Do you have any of the following symptoms:  Cough yes/no: No Fever (>100.70F)  yes/no: No Runny nose yes/no: No Sore throat yes/no: No Difficulty breathing/shortness of breath  yes/no: No  Have you traveled in the last 14 days and where? yes/no: No  Son [name] verbalized understanding of instructions that were given via phone.

## 2021-04-30 ENCOUNTER — Encounter (HOSPITAL_COMMUNITY): Payer: Self-pay | Admitting: Internal Medicine

## 2021-04-30 ENCOUNTER — Inpatient Hospital Stay (HOSPITAL_COMMUNITY): Payer: Medicare HMO

## 2021-04-30 ENCOUNTER — Encounter (HOSPITAL_COMMUNITY)
Admission: RE | Disposition: A | Discharge status: Home / Self Care | Payer: Medicare HMO | Source: Home / Self Care | Attending: Internal Medicine

## 2021-04-30 ENCOUNTER — Other Ambulatory Visit: Payer: Self-pay

## 2021-04-30 ENCOUNTER — Inpatient Hospital Stay (HOSPITAL_COMMUNITY): Payer: Medicare HMO | Admitting: Emergency Medicine

## 2021-04-30 ENCOUNTER — Inpatient Hospital Stay (HOSPITAL_COMMUNITY)
Admission: RE | Admit: 2021-04-30 | Discharge: 2021-05-01 | DRG: 262 | Disposition: A | Discharge status: Home / Self Care | Payer: Medicare HMO | Attending: Internal Medicine | Admitting: Internal Medicine

## 2021-04-30 DIAGNOSIS — M1990 Unspecified osteoarthritis, unspecified site: Secondary | ICD-10-CM | POA: Diagnosis present

## 2021-04-30 DIAGNOSIS — Z952 Presence of prosthetic heart valve: Secondary | ICD-10-CM

## 2021-04-30 DIAGNOSIS — T82110S Breakdown (mechanical) of cardiac electrode, sequela: Secondary | ICD-10-CM | POA: Diagnosis not present

## 2021-04-30 DIAGNOSIS — F0390 Unspecified dementia without behavioral disturbance: Secondary | ICD-10-CM | POA: Diagnosis present

## 2021-04-30 DIAGNOSIS — Z96642 Presence of left artificial hip joint: Secondary | ICD-10-CM | POA: Diagnosis present

## 2021-04-30 DIAGNOSIS — T82118A Breakdown (mechanical) of other cardiac electronic device, initial encounter: Secondary | ICD-10-CM | POA: Diagnosis not present

## 2021-04-30 DIAGNOSIS — I2510 Atherosclerotic heart disease of native coronary artery without angina pectoris: Secondary | ICD-10-CM | POA: Diagnosis present

## 2021-04-30 DIAGNOSIS — Z96611 Presence of right artificial shoulder joint: Secondary | ICD-10-CM | POA: Diagnosis present

## 2021-04-30 DIAGNOSIS — J449 Chronic obstructive pulmonary disease, unspecified: Secondary | ICD-10-CM | POA: Diagnosis present

## 2021-04-30 DIAGNOSIS — Z950 Presence of cardiac pacemaker: Secondary | ICD-10-CM

## 2021-04-30 DIAGNOSIS — Z8249 Family history of ischemic heart disease and other diseases of the circulatory system: Secondary | ICD-10-CM | POA: Diagnosis not present

## 2021-04-30 DIAGNOSIS — E785 Hyperlipidemia, unspecified: Secondary | ICD-10-CM | POA: Diagnosis present

## 2021-04-30 DIAGNOSIS — Y838 Other surgical procedures as the cause of abnormal reaction of the patient, or of later complication, without mention of misadventure at the time of the procedure: Secondary | ICD-10-CM | POA: Diagnosis present

## 2021-04-30 DIAGNOSIS — T82110D Breakdown (mechanical) of cardiac electrode, subsequent encounter: Secondary | ICD-10-CM

## 2021-04-30 DIAGNOSIS — Z951 Presence of aortocoronary bypass graft: Secondary | ICD-10-CM | POA: Diagnosis not present

## 2021-04-30 DIAGNOSIS — I480 Paroxysmal atrial fibrillation: Secondary | ICD-10-CM | POA: Diagnosis present

## 2021-04-30 DIAGNOSIS — Z79899 Other long term (current) drug therapy: Secondary | ICD-10-CM

## 2021-04-30 DIAGNOSIS — T82110A Breakdown (mechanical) of cardiac electrode, initial encounter: Principal | ICD-10-CM | POA: Diagnosis present

## 2021-04-30 DIAGNOSIS — Z85828 Personal history of other malignant neoplasm of skin: Secondary | ICD-10-CM | POA: Diagnosis not present

## 2021-04-30 DIAGNOSIS — Z9049 Acquired absence of other specified parts of digestive tract: Secondary | ICD-10-CM | POA: Diagnosis not present

## 2021-04-30 DIAGNOSIS — K219 Gastro-esophageal reflux disease without esophagitis: Secondary | ICD-10-CM | POA: Diagnosis present

## 2021-04-30 DIAGNOSIS — T82198A Other mechanical complication of other cardiac electronic device, initial encounter: Secondary | ICD-10-CM

## 2021-04-30 DIAGNOSIS — Z7951 Long term (current) use of inhaled steroids: Secondary | ICD-10-CM | POA: Diagnosis not present

## 2021-04-30 DIAGNOSIS — Z8673 Personal history of transient ischemic attack (TIA), and cerebral infarction without residual deficits: Secondary | ICD-10-CM

## 2021-04-30 DIAGNOSIS — T82190A Other mechanical complication of cardiac electrode, initial encounter: Principal | ICD-10-CM

## 2021-04-30 DIAGNOSIS — Z7901 Long term (current) use of anticoagulants: Secondary | ICD-10-CM | POA: Diagnosis not present

## 2021-04-30 DIAGNOSIS — I10 Essential (primary) hypertension: Secondary | ICD-10-CM | POA: Diagnosis present

## 2021-04-30 DIAGNOSIS — Z7982 Long term (current) use of aspirin: Secondary | ICD-10-CM

## 2021-04-30 HISTORY — PX: PACEMAKER LEAD REMOVAL: SHX5064

## 2021-04-30 LAB — ECHO INTRAOPERATIVE TEE
Height: 69 in
Weight: 2560 oz

## 2021-04-30 LAB — PREPARE RBC (CROSSMATCH)

## 2021-04-30 SURGERY — REMOVAL, ELECTRODE LEAD, CARDIAC PACEMAKER, WITHOUT REPLACEMENT
Anesthesia: General | Site: Chest

## 2021-04-30 MED ORDER — SUCCINYLCHOLINE CHLORIDE 200 MG/10ML IV SOSY
PREFILLED_SYRINGE | INTRAVENOUS | Status: DC | PRN
  Administered 2021-04-30: 100 mg via INTRAVENOUS

## 2021-04-30 MED ORDER — CHLORHEXIDINE GLUCONATE 0.12 % MT SOLN
OROMUCOSAL | Status: AC
  Administered 2021-04-30: 15 mL via OROMUCOSAL
  Filled 2021-04-30: qty 15

## 2021-04-30 MED ORDER — CEFAZOLIN SODIUM-DEXTROSE 2-4 GM/100ML-% IV SOLN
2.0000 g | INTRAVENOUS | Status: AC
  Administered 2021-04-30: 2 g via INTRAVENOUS
  Filled 2021-04-30: qty 100

## 2021-04-30 MED ORDER — ALBUTEROL SULFATE HFA 108 (90 BASE) MCG/ACT IN AERS
INHALATION_SPRAY | RESPIRATORY_TRACT | Status: DC | PRN
  Administered 2021-04-30: 4 via RESPIRATORY_TRACT

## 2021-04-30 MED ORDER — ONDANSETRON HCL 4 MG/2ML IJ SOLN: 4.0000 mg | Freq: Four times a day (QID) | INTRAMUSCULAR | Status: DC | PRN

## 2021-04-30 MED ORDER — ORAL CARE MOUTH RINSE: 15.0000 mL | Freq: Once | OROMUCOSAL | Status: AC

## 2021-04-30 MED ORDER — SODIUM CHLORIDE 0.9 % IV SOLN
INTRAVENOUS | Status: AC
  Filled 2021-04-30: qty 2

## 2021-04-30 MED ORDER — ACETAMINOPHEN 325 MG PO TABS: 325.0000 mg | ORAL_TABLET | ORAL | Status: DC | PRN

## 2021-04-30 MED ORDER — ALBUTEROL SULFATE (2.5 MG/3ML) 0.083% IN NEBU
2.5000 mg | INHALATION_SOLUTION | Freq: Four times a day (QID) | RESPIRATORY_TRACT | Status: DC | PRN
  Administered 2021-04-30: 2.5 mg via RESPIRATORY_TRACT
  Filled 2021-04-30: qty 3

## 2021-04-30 MED ORDER — ONDANSETRON HCL 4 MG/2ML IJ SOLN: 4.0000 mg | Freq: Once | INTRAMUSCULAR | Status: DC | PRN

## 2021-04-30 MED ORDER — PROPOFOL 10 MG/ML IV BOLUS
INTRAVENOUS | Status: DC | PRN
  Administered 2021-04-30: 100 mg via INTRAVENOUS

## 2021-04-30 MED ORDER — LACTATED RINGERS IV SOLN: INTRAVENOUS | Status: DC

## 2021-04-30 MED ORDER — CHLORHEXIDINE GLUCONATE 0.12 % MT SOLN: 15.0000 mL | Freq: Once | OROMUCOSAL | Status: AC

## 2021-04-30 MED ORDER — PROPOFOL 10 MG/ML IV BOLUS
INTRAVENOUS | Status: AC
  Filled 2021-04-30: qty 20

## 2021-04-30 MED ORDER — FENTANYL CITRATE (PF) 250 MCG/5ML IJ SOLN
INTRAMUSCULAR | Status: AC
  Filled 2021-04-30: qty 5

## 2021-04-30 MED ORDER — ACETAMINOPHEN 10 MG/ML IV SOLN: 1000.0000 mg | Freq: Once | INTRAVENOUS | Status: DC | PRN

## 2021-04-30 MED ORDER — POVIDONE-IODINE 10 % EX SWAB
2.0000 "application " | Freq: Once | CUTANEOUS | Status: AC
  Administered 2021-04-30: 2 via TOPICAL

## 2021-04-30 MED ORDER — PHENYLEPHRINE 40 MCG/ML (10ML) SYRINGE FOR IV PUSH (FOR BLOOD PRESSURE SUPPORT)
PREFILLED_SYRINGE | INTRAVENOUS | Status: DC | PRN
  Administered 2021-04-30 (×2): 80 ug via INTRAVENOUS

## 2021-04-30 MED ORDER — FENTANYL CITRATE (PF) 250 MCG/5ML IJ SOLN
INTRAMUSCULAR | Status: DC | PRN
  Administered 2021-04-30 (×3): 50 ug via INTRAVENOUS

## 2021-04-30 MED ORDER — ACETAMINOPHEN 500 MG PO TABS
1000.0000 mg | ORAL_TABLET | Freq: Three times a day (TID) | ORAL | Status: DC | PRN
  Administered 2021-04-30: 1000 mg via ORAL
  Filled 2021-04-30: qty 2

## 2021-04-30 MED ORDER — SODIUM CHLORIDE 0.9 % IV SOLN: 80.0000 mg | INTRAVENOUS | Status: DC

## 2021-04-30 MED ORDER — SODIUM CHLORIDE 0.9 % IV SOLN: INTRAVENOUS | Status: DC

## 2021-04-30 MED ORDER — HEPARIN 6000 UNIT IRRIGATION SOLUTION
Status: AC
  Filled 2021-04-30: qty 500

## 2021-04-30 MED ORDER — LIDOCAINE 2% (20 MG/ML) 5 ML SYRINGE
INTRAMUSCULAR | Status: DC | PRN
  Administered 2021-04-30: 100 mg via INTRAVENOUS

## 2021-04-30 MED ORDER — CHLORHEXIDINE GLUCONATE 4 % EX LIQD: 4.0000 "application " | Freq: Once | CUTANEOUS | Status: DC

## 2021-04-30 MED ORDER — CEFAZOLIN SODIUM-DEXTROSE 1-4 GM/50ML-% IV SOLN
1.0000 g | Freq: Four times a day (QID) | INTRAVENOUS | Status: AC
  Administered 2021-04-30 – 2021-05-01 (×3): 1 g via INTRAVENOUS
  Filled 2021-04-30 (×4): qty 50

## 2021-04-30 MED ORDER — DEXAMETHASONE SODIUM PHOSPHATE 10 MG/ML IJ SOLN
INTRAMUSCULAR | Status: DC | PRN
  Administered 2021-04-30: 10 mg via INTRAVENOUS

## 2021-04-30 MED ORDER — SUGAMMADEX SODIUM 200 MG/2ML IV SOLN
INTRAVENOUS | Status: DC | PRN
  Administered 2021-04-30: 200 mg via INTRAVENOUS

## 2021-04-30 MED ORDER — LACTATED RINGERS IV SOLN: INTRAVENOUS | Status: DC | PRN

## 2021-04-30 MED ORDER — METOPROLOL SUCCINATE ER 50 MG PO TB24
50.0000 mg | ORAL_TABLET | Freq: Every day | ORAL | Status: DC
  Administered 2021-04-30 – 2021-05-01 (×2): 50 mg via ORAL
  Filled 2021-04-30 (×2): qty 1

## 2021-04-30 MED ORDER — FENTANYL CITRATE (PF) 100 MCG/2ML IJ SOLN: 25.0000 ug | INTRAMUSCULAR | Status: DC | PRN

## 2021-04-30 MED ORDER — ROCURONIUM BROMIDE 10 MG/ML (PF) SYRINGE
PREFILLED_SYRINGE | INTRAVENOUS | Status: DC | PRN
  Administered 2021-04-30 (×2): 50 mg via INTRAVENOUS

## 2021-04-30 MED ORDER — SODIUM CHLORIDE 0.9% IV SOLUTION: Freq: Once | INTRAVENOUS | Status: DC

## 2021-04-30 MED ORDER — NITROGLYCERIN 0.2 MG/ML ON CALL CATH LAB
INTRAVENOUS | Status: DC | PRN
  Administered 2021-04-30: 20 ug via INTRAVENOUS
  Administered 2021-04-30: 40 ug via INTRAVENOUS
  Administered 2021-04-30 (×2): 20 ug via INTRAVENOUS

## 2021-04-30 SURGICAL SUPPLY — 40 items
BAG BANDED W/RUBBER/TAPE 36X54 (MISCELLANEOUS) IMPLANT
BAG COUNTER SPONGE SURGICOUNT (BAG) ×2 IMPLANT
BLADE CORE FAN STRYKER (BLADE) ×2 IMPLANT
BLADE OSCILLATING /SAGITTAL (BLADE) IMPLANT
BLADE STERNUM SYSTEM 6 (BLADE) IMPLANT
BLADE SURG 10 STRL SS (BLADE) ×2 IMPLANT
BNDG COHESIVE 4X5 WHT NS (GAUZE/BANDAGES/DRESSINGS) IMPLANT
CANISTER SUCT 3000ML PPV (MISCELLANEOUS) ×2 IMPLANT
COVER BACK TABLE 60X90IN (DRAPES) ×2 IMPLANT
COVER SURGICAL LIGHT HANDLE (MISCELLANEOUS) ×2 IMPLANT
DRAPE CARDIOVASCULAR INCISE (DRAPES) ×2
DRAPE SRG 135X102X78XABS (DRAPES) ×1 IMPLANT
DRSG OPSITE 6X11 MED (GAUZE/BANDAGES/DRESSINGS) IMPLANT
DRSG TEGADERM 4X4.75 (GAUZE/BANDAGES/DRESSINGS) ×2 IMPLANT
ELECT REM PT RETURN 9FT ADLT (ELECTROSURGICAL) ×4
ELECTRODE REM PT RTRN 9FT ADLT (ELECTROSURGICAL) ×2 IMPLANT
GAUZE 4X4 16PLY ~~LOC~~+RFID DBL (SPONGE) ×4 IMPLANT
GAUZE SPONGE 4X4 12PLY STRL (GAUZE/BANDAGES/DRESSINGS) ×2 IMPLANT
GLOVE SURG LTX SZ8 (GLOVE) ×2 IMPLANT
GLOVE SURG MICRO LTX SZ6.5 (GLOVE) ×2 IMPLANT
GLOVE SURG UNDER POLY LF SZ7.5 (GLOVE) ×2 IMPLANT
GOWN STRL REUS W/ TWL LRG LVL3 (GOWN DISPOSABLE) IMPLANT
GOWN STRL REUS W/ TWL XL LVL3 (GOWN DISPOSABLE) ×1 IMPLANT
GOWN STRL REUS W/TWL LRG LVL3 (GOWN DISPOSABLE)
GOWN STRL REUS W/TWL XL LVL3 (GOWN DISPOSABLE) ×2
KIT BASIN OR (CUSTOM PROCEDURE TRAY) ×2 IMPLANT
KIT TURNOVER KIT B (KITS) ×2 IMPLANT
LEAD CAPSURE NOVUS 5076-52CM (Lead) ×2 IMPLANT
NEEDLE PERC 18GX7CM (NEEDLE) ×2 IMPLANT
PACEMAKER STYLET GRAY (MISCELLANEOUS) ×2 IMPLANT
PAD ARMBOARD 7.5X6 YLW CONV (MISCELLANEOUS) ×4 IMPLANT
PAD ELECT DEFIB RADIOL ZOLL (MISCELLANEOUS) ×2 IMPLANT
SLING ARM IMMOBILIZER XL (CAST SUPPLIES) ×2 IMPLANT
SUT PROLENE 2 0 SH DA (SUTURE) IMPLANT
SUT VIC AB 3-0 X1 27 (SUTURE) ×2 IMPLANT
TOWEL GREEN STERILE (TOWEL DISPOSABLE) ×2 IMPLANT
TOWEL GREEN STERILE FF (TOWEL DISPOSABLE) ×2 IMPLANT
TRAY FOLEY SLVR 16FR TEMP STAT (SET/KITS/TRAYS/PACK) ×2 IMPLANT
TUBE CONNECTING 12X1/4 (SUCTIONS) IMPLANT
YANKAUER SUCT BULB TIP NO VENT (SUCTIONS) IMPLANT

## 2021-04-30 NOTE — Progress Notes (Signed)
Remote pacemaker transmission.   

## 2021-04-30 NOTE — Interval H&P Note (Signed)
History and Physical Interval Note:  04/30/2021 12:21 PM  Blake Burgess  has presented today for surgery, with the diagnosis of LEAD FAILURE.  The various methods of treatment have been discussed with the patient and family. After consideration of risks, benefits and other options for treatment, the patient has consented to  Procedure(s): PACEMAKER LEAD REMOVAL AND REPLACMENT (N/A) TRANSESOPHAGEAL ECHOCARDIOGRAM (TEE) as a surgical intervention.  The patient's history has been reviewed, patient examined, no change in status, stable for surgery.  I have reviewed the patient's chart and labs.  Questions were answered to the patient's satisfaction.     Cristopher Peru

## 2021-04-30 NOTE — Anesthesia Preprocedure Evaluation (Signed)
Anesthesia Evaluation  Patient identified by MRN, date of birth, ID band Patient awake    Reviewed: Allergy & Precautions, NPO status , Patient's Chart, lab work & pertinent test results  Airway Mallampati: II  TM Distance: >3 FB Neck ROM: Full    Dental no notable dental hx.    Pulmonary COPD,  COPD inhaler,    Pulmonary exam normal breath sounds clear to auscultation + decreased breath sounds      Cardiovascular hypertension, + CAD, + Past MI and + CABG  + dysrhythmias + pacemaker  Rhythm:Regular Rate:Normal + Systolic murmurs Left ventricular ejection fraction, by estimation, is 55 to 60%. The  left ventricle has normal function. The left ventricle has no regional  wall motion abnormalities. Left ventricular diastolic parameters are  consistent with Grade I diastolic  dysfunction (impaired relaxation).  2. Right ventricular systolic function is normal. The right ventricular  size is normal. There is normal pulmonary artery systolic pressure. The  estimated right ventricular systolic pressure is 99991111 mmHg.  3. Left atrial size was moderately dilated.  4. Right atrial size was mildly dilated.  5. The mitral valve is degenerative. Mild mitral valve regurgitation. No  evidence of mitral stenosis. Moderate mitral annular calcification.  6. Tricuspid valve regurgitation is mild to moderate.  7. The aortic valve has been repaired/replaced. Aortic valve  regurgitation is not visualized. There is a 26 mm Sapien prosthetic (TAVR)  valve present in the aortic position. Procedure Date: 03/03/2021. Aortic  valve mean gradient measures 10.0 mmHg.  Aortic valve Vmax measures 2.20 m/s. Aortic valve acceleration time  measures 90 msec.    Neuro/Psych Dementia CVA    GI/Hepatic Neg liver ROS, GERD  ,  Endo/Other  diabetes  Renal/GU Renal InsufficiencyRenal disease  negative genitourinary   Musculoskeletal negative  musculoskeletal ROS (+)   Abdominal   Peds negative pediatric ROS (+)  Hematology negative hematology ROS (+)   Anesthesia Other Findings Complete A-V block without pacemaker  Reproductive/Obstetrics negative OB ROS                             Anesthesia Physical Anesthesia Plan  ASA: 4  Anesthesia Plan: General   Post-op Pain Management:    Induction: Intravenous  PONV Risk Score and Plan: 2 and Ondansetron, Dexamethasone and Treatment may vary due to age or medical condition  Airway Management Planned: Oral ETT  Additional Equipment:   Intra-op Plan:   Post-operative Plan: Extubation in OR  Informed Consent: I have reviewed the patients History and Physical, chart, labs and discussed the procedure including the risks, benefits and alternatives for the proposed anesthesia with the patient or authorized representative who has indicated his/her understanding and acceptance.     Dental advisory given  Plan Discussed with: CRNA and Surgeon  Anesthesia Plan Comments:         Anesthesia Quick Evaluation

## 2021-04-30 NOTE — Transfer of Care (Signed)
Immediate Anesthesia Transfer of Care Note  Patient: Blake Burgess  Procedure(s) Performed: PACEMAKER LEAD REMOVAL AND REPLACMENT (Chest)  Patient Location: PACU  Anesthesia Type:General  Level of Consciousness: awake, alert  and oriented  Airway & Oxygen Therapy: Patient Spontanous Breathing  Post-op Assessment: Report given to RN and Post -op Vital signs reviewed and stable  Post vital signs: Reviewed and stable  Last Vitals:  Vitals Value Taken Time  BP 149/94 04/30/21 1442  Temp    Pulse 77 04/30/21 1445  Resp 18 04/30/21 1445  SpO2 98 % 04/30/21 1445  Vitals shown include unvalidated device data.  Last Pain:  Vitals:   04/30/21 1124  TempSrc:   PainSc: 0-No pain         Complications: No notable events documented.

## 2021-04-30 NOTE — Progress Notes (Signed)
Patient complained of shortness of breath.  O2 sats 100% RA.  Patient states he would take nebulizer at home if he felt like this.  Albuterol PRN Ned given at 1755.

## 2021-04-30 NOTE — Anesthesia Procedure Notes (Signed)
Procedure Name: Intubation Date/Time: 04/30/2021 12:53 PM Performed by: Griffin Dakin, CRNA Pre-anesthesia Checklist: Patient identified, Emergency Drugs available, Suction available and Patient being monitored Patient Re-evaluated:Patient Re-evaluated prior to induction Oxygen Delivery Method: Circle system utilized Preoxygenation: Pre-oxygenation with 100% oxygen Induction Type: IV induction Ventilation: Mask ventilation without difficulty and Oral airway inserted - appropriate to patient size Laryngoscope Size: Mac and 4 Grade View: Grade I Tube type: Oral Tube size: 7.5 mm Number of attempts: 1 Airway Equipment and Method: Stylet and Oral airway Placement Confirmation: ETT inserted through vocal cords under direct vision, positive ETCO2 and breath sounds checked- equal and bilateral Secured at: 23 cm Tube secured with: Tape Dental Injury: Teeth and Oropharynx as per pre-operative assessment

## 2021-04-30 NOTE — Anesthesia Postprocedure Evaluation (Signed)
Anesthesia Post Note  Patient: Blake Burgess  Procedure(s) Performed: PACEMAKER LEAD REMOVAL AND REPLACMENT (Chest)     Patient location during evaluation: PACU Anesthesia Type: General Level of consciousness: awake and alert Pain management: pain level controlled Vital Signs Assessment: post-procedure vital signs reviewed and stable Respiratory status: spontaneous breathing, nonlabored ventilation, respiratory function stable and patient connected to nasal cannula oxygen Cardiovascular status: blood pressure returned to baseline and stable Postop Assessment: no apparent nausea or vomiting Anesthetic complications: no   No notable events documented.  Last Vitals:  Vitals:   04/30/21 1515 04/30/21 1530  BP: 139/64 (!) 163/74  Pulse: 73 74  Resp: (!) 21 19  Temp:    SpO2: 99% 99%    Last Pain:  Vitals:   04/30/21 1515  TempSrc:   PainSc: 0-No pain                 Satomi Buda S

## 2021-04-30 NOTE — CV Procedure (Signed)
EP Procedure Note  Preoperative diagnosis: atrial lead malfunction  Postoperative diagnosis: same as preoperative diagnosis  Procedure Performed: PM lead extraction with insertion of a new atrial lead and relocation of a PM pocket.  Description of the procedure: after informed consent was obtained the patient was prepped and draped in a sterile fashion. The anesthesia service was utilized to provide general endotracheal anesthesia and invasive arterial monitoring was applied with a catheter in the right radial artery. A 6 french sheath was inserted percutaneously into the right femoral vein.   Attention was then turned to removal of the atrial lead and insertion of a new atrial lead. A 5 cm incision was carried out .Electrocautery was used to dissect down to the PM pocket and electrocautery was used to free up the PM leads. The atrial lead was disconnected from the generator. A stylet was inserted into the lead and the helix retracted and the lead was withdrawn into the SVC. The vein was then punctured and a j-wire advanced into the central circulation. The pacemaker lead was retracted and tension placed on the lead and the lead was removed in total. A 7 french peel away sheath was advanced and the the medtronic atrial lead, # [id number] was advanced into the RA where it was fixed in place. The R waves measured 5 mV. The pacing impedence was 553 ohms and the threshold 0.7 volts at 0.5 ms. The old RV lead was not affected and remained in place. He was device dependent. The new lead was secured to the fascia with silk suture and the sewing sleeve secured with silk suture and the lead was connected to the old generator and a sub pectoral pocket was fashioned with a combination of blunt dissection and electrocautery. The old device, old RV lead and new atrial lead was placed into the subpectoral pocket and the pocket irrigated with anti-biotic irrigation and the muscle closed with 2-0 vicryl suture along with  the deep tissue and the skin. Benzoin and steristrips were painted on the skin, a bandage applied, and the right femoral venous sheath removed and the patient recovered in the usual manner.   Complications: none immediately  Conclusion: successful extraction of a 87 year old atrial lead which had developed lead noise and resulted in atrial inhibition and insertion of a new atrial lead with relocation of the PM pocket to a submuscular location.  Carleene Overlie Ronnel Zuercher,MD

## 2021-05-01 ENCOUNTER — Inpatient Hospital Stay (HOSPITAL_COMMUNITY): Payer: Medicare HMO

## 2021-05-01 ENCOUNTER — Encounter (HOSPITAL_COMMUNITY): Payer: Self-pay | Admitting: Internal Medicine

## 2021-05-01 DIAGNOSIS — T82118A Breakdown (mechanical) of other cardiac electronic device, initial encounter: Secondary | ICD-10-CM

## 2021-05-01 DIAGNOSIS — T82110S Breakdown (mechanical) of cardiac electrode, sequela: Secondary | ICD-10-CM

## 2021-05-01 MED FILL — Gentamicin Sulfate Inj 40 MG/ML: INTRAMUSCULAR | Qty: 80 | Status: AC

## 2021-05-01 NOTE — Plan of Care (Signed)
  Problem: Education: Goal: Knowledge of cardiac device and self-care will improve Outcome: Adequate for Discharge Goal: Ability to safely manage health related needs after discharge will improve Outcome: Adequate for Discharge Goal: Individualized Educational Video(s) Outcome: Adequate for Discharge   Problem: Cardiac: Goal: Ability to achieve and maintain adequate cardiopulmonary perfusion will improve Outcome: Adequate for Discharge   

## 2021-05-01 NOTE — Discharge Summary (Addendum)
ELECTROPHYSIOLOGY PROCEDURE DISCHARGE SUMMARY    Patient ID: Blake Burgess,  MRN: [id number], DOB/AGE: Feb 07, 1932 87 y.o.  Admit date: 04/30/2021 Discharge date: 05/01/2021  Primary Care Physician: Raelene Bott, MD  Primary Cardiologist: Dr. Percival Spanish Structural: Dr. Angelena Form Electrophysiologist: \Dr. Lovena Le  Primary Discharge Diagnosis:  PPM lead failure  Secondary Discharge Diagnosis:  CAD Prior CABG VHD Hx TAVR 3. COPD Described as severe 4. Paroxysmal Afib CHA2DS2Vasc is 7, on Eliquis   No Known Allergies   Procedures This Admission:  04/30/21 Conclusion: successful extraction of a 87 year old atrial lead which had developed lead noise and resulted in atrial inhibition and insertion of a new atrial lead with relocation of the PM pocket to a submuscular location. CXR on 05/01/21 demonstrated no pneumothorax status post device implantation.   Brief HPI: Blake Burgess is a 87 y.o. male is followed by Dr. Lovena Le out patient with PPM in place, RA lead with noise causing pacing inhibition, recommended RA lead extraction and new lead isertion.   Risks, benefits, and alternatives to PPM implantation were reviewed with the patient who wished to proceed.   Hospital Course:  The patient was admitted and underwent planned procedure, see procedure report for full details.  He was monitored on telemetry overnight which demonstrated SR/ and AV pacing.  Left chest was without hematoma or ecchymosis.  The device was interrogated and found to be functioning normally.  CXR was obtained and demonstrated no pneumothorax status post device implantation.  Wound care, arm mobility, and restrictions were reviewed with the patient.  The patient feels well, denies any CP/SOB, with minimal site discomfort.  He was examined by Dr. Lovena Le and considered stable for discharge to home.    Resume Eliquis  05/05/21  Physical Exam: Vitals:   05/01/21 0001 05/01/21 0429 05/01/21 0831 05/01/21 1030  BP:  133/62 (!) 149/64 (!) 150/102 (!) 112/99  Pulse: 60  69 66  Resp: '20 13 20   '$ Temp: 98.4 F (36.9 C) 98.3 F (36.8 C) 98.4 F (36.9 C)   TempSrc: Oral Oral Oral   SpO2: 100% 100% 99%   Weight:      Height:        GEN- The patient is well appearing, alert and oriented x 3 today.   HEENT: normocephalic, atraumatic; sclera clear, conjunctiva pink; hearing intact; oropharynx clear; neck supple, no JVP Lungs- CTA b/l, normal work of breathing.  No wheezes, rales, rhonchi Heart- RRR, no murmurs, rubs or gallops, PMI not laterally displaced GI- soft, non-tender, non-distended Extremities- no clubbing, cyanosis, or edema MS- no significant deformity or atrophy Skin- warm and dry, no rash or lesion, left chest without hematoma/ecchymosis, small area of ecchymosis lateralPsych- euthymic mood, full affect Neuro- no gross deficits   Labs:   Lab Results  Component Value Date   WBC 8.7 04/08/2021   HGB 10.2 (L) 04/08/2021   HCT 31.8 (L) 04/08/2021   MCV 98 (H) 04/08/2021   PLT 243 04/08/2021   No results for input(s): NA, K, CL, CO2, BUN, CREATININE, CALCIUM, PROT, BILITOT, ALKPHOS, ALT, AST, GLUCOSE in the last 168 hours.  Invalid input(s): LABALBU  Discharge Medications:  Allergies as of 05/01/2021   No Known Allergies      Medication List     TAKE these medications    acetaminophen 500 MG tablet Commonly known as: TYLENOL Take 1,000 mg by mouth every 8 (eight) hours as needed for moderate pain.   albuterol (2.5 MG/3ML) 0.083% nebulizer solution  Commonly known as: PROVENTIL Take 3 mLs (2.5 mg total) by nebulization every 6 (six) hours as needed for wheezing or shortness of breath.   amLODipine 2.5 MG tablet Commonly known as: NORVASC Take 2.5 mg by mouth in the morning.   aspirin 81 MG chewable tablet Chew 1 tablet (81 mg total) by mouth daily.   atorvastatin 40 MG tablet Commonly known as: LIPITOR Take 40 mg by mouth in the morning.   cyanocobalamin 1000 MCG/ML  injection Commonly known as: (VITAMIN B-12) Inject 1,000 mcg into the muscle every 30 (thirty) days.   donepezil 10 MG tablet Commonly known as: ARICEPT Take 10 mg by mouth at bedtime.   Eliquis 5 MG Tabs tablet Generic drug: apixaban TAKE 1 TABLET BY MOUTH TWICE A DAY Notes to patient: Resume on 05/05/21 morning   enalapril 20 MG tablet Commonly known as: VASOTEC Take 20 mg by mouth 2 (two) times daily.   furosemide 40 MG tablet Commonly known as: LASIX Take 40 mg by mouth in the morning.   isosorbide mononitrate 60 MG 24 hr tablet Commonly known as: IMDUR Take 1 tablet (60 mg total) by mouth daily.   Lidocaine 4 % Ptch Apply 1 patch topically daily as needed (pain).   metoprolol succinate 50 MG 24 hr tablet Commonly known as: TOPROL-XL Take 1 tablet (50 mg total) by mouth daily.   nitroGLYCERIN 0.4 MG SL tablet Commonly known as: NITROSTAT Place 1 tablet (0.4 mg total) under the tongue every 5 (five) minutes as needed for chest pain.   omeprazole 20 MG capsule Commonly known as: PRILOSEC Take 20 mg by mouth in the morning.   tamsulosin 0.4 MG Caps capsule Commonly known as: FLOMAX Take 0.4 mg by mouth in the morning.   Trelegy Ellipta 100-62.5-25 MCG/INH Aepb Generic drug: Fluticasone-Umeclidin-Vilant Inhale 1 puff into the lungs daily. Rinse mouth after               Discharge Care Instructions  (From admission, onward)           Start     Ordered   05/01/21 0000  Discharge wound care:       Comments: As per AVS   05/01/21 1118            Disposition: Home Discharge Instructions     Diet - low sodium heart healthy   Complete by: As directed    Discharge wound care:   Complete by: As directed    As per AVS   Increase activity slowly   Complete by: As directed        Follow-up Information     [facility] Follow up.   Specialty: Cardiology Why: 05/13/21 @ 2:00PM,  wound check visit Contact information: [address] [place]        Evans Lance, MD Follow up.   Specialty: Cardiology Why: 08/04/21 @ 3:30PM Contact information: [id number] [place] [address] [phone]                 Duration of Discharge Encounter: Greater than 30 minutes including physician time.  Venetia Night, PA-C 05/01/2021 11:19 AM  EP Attending  Patient seen and examined. Agree with above. The patient is doing well today after undergoing atrial lead extraction and insertion of a new atrial lead due to atrial lead malfunction. His PPM has been interrogated under my direction and his DDD PPM  is working normally. CXR looks good. He will be discharged home with usual followup. I asked him to hold his eliquis until 9/13.    Carleene Overlie Tressia Labrum,MD

## 2021-05-01 NOTE — Discharge Instructions (Addendum)
    Supplemental Discharge Instructions for  Pacemaker/Defibrillator Patients   Activity No heavy lifting or vigorous activity with your left arm for 6 to 8 weeks.  Do not raise your left arm above your head for one week.  Gradually raise your affected arm as drawn below.              05/05/21                    05/06/21                   05/07/21                   05/08/21 __  NO DRIVING for   1 week  ; you may begin driving on   [id number] .  WOUND CARE Keep the wound area clean and dry.  Do not get this area wet , no showers for one week; you may shower on  05/08/21  . The tape/steri-strips on your wound will fall off; do not pull them off.  No bandage is needed on the site.  DO  NOT apply any creams, oils, or ointments to the wound area. If you notice any drainage or discharge from the wound, any swelling or bruising at the site, or you develop a fever > 101? F after you are discharged home, call the office at once.  Special Instructions You are still able to use cellular telephones; use the ear opposite the side where you have your pacemaker/defibrillator.  Avoid carrying your cellular phone near your device. When traveling through airports, show security personnel your identification card to avoid being screened in the metal detectors.  Ask the security personnel to use the hand wand. Avoid arc welding equipment, MRI testing (magnetic resonance imaging), TENS units (transcutaneous nerve stimulators).  Call the office for questions about other devices. Avoid electrical appliances that are in poor condition or are not properly grounded. Microwave ovens are safe to be near or to operate.

## 2021-05-01 NOTE — Plan of Care (Signed)
  Problem: Cardiac: Goal: Ability to achieve and maintain adequate cardiopulmonary perfusion will improve Outcome: Progressing   

## 2021-05-04 LAB — BPAM RBC
Blood Product Expiration Date: 202210022359
Blood Product Expiration Date: 202210032359
Unit Type and Rh: 6200
Unit Type and Rh: 6200

## 2021-05-04 LAB — TYPE AND SCREEN
ABO/RH(D): A POS
Antibody Screen: NEGATIVE
Unit division: 0
Unit division: 0

## 2021-05-06 NOTE — Progress Notes (Signed)
      [address]       [place] [address]             [phone]      On 04/30/2021 I provided surgical backup for lead extraction by Dr. Lovena Le.  10 minutes of standby time  Remo Lipps C. Roxan Hockey, MD [facility] [phone]

## 2021-05-11 ENCOUNTER — Encounter: Payer: Self-pay | Admitting: Pulmonary Disease

## 2021-05-11 ENCOUNTER — Ambulatory Visit: Payer: Medicare HMO | Admitting: Pulmonary Disease

## 2021-05-11 ENCOUNTER — Other Ambulatory Visit: Payer: Self-pay

## 2021-05-11 VITALS — BP 116/72 | HR 60 | Ht 69.0 in | Wt 162.8 lb

## 2021-05-11 DIAGNOSIS — Z952 Presence of prosthetic heart valve: Secondary | ICD-10-CM

## 2021-05-11 DIAGNOSIS — J449 Chronic obstructive pulmonary disease, unspecified: Secondary | ICD-10-CM

## 2021-05-11 DIAGNOSIS — R0609 Other forms of dyspnea: Secondary | ICD-10-CM

## 2021-05-11 DIAGNOSIS — R0600 Dyspnea, unspecified: Secondary | ICD-10-CM

## 2021-05-11 NOTE — Progress Notes (Signed)
Synopsis: Referred in June 2022 for abnormal PFTs, PCP: By Raelene Bott, MD  Subjective:   PATIENT ID: Blake Burgess GENDER: male DOB: 03/28/1932, MRN: [id number]  Chief Complaint  Patient presents with   Follow-up    3 mo f/u for COPD. States his SOB increased this morning due to him not using his inhaler this morning. Denies any increase cough.     This is an 87 year old gentleman, history of mild aortic stenosis, coronary artery disease status post CABG, gastroesophageal reflux, hypertension, pacemaker placement.  Patient referred for evaluation of abnormal PFTs.  Patient follows with Dr. Percival Spanish from cardiology.  Pulmonary function tests were completed on 01/14/2021.  PFTs revealed a ratio of 34, an FEV1 of 40% predicted with a value of 1 L no significant bronchodilator response, TLC 102% predicted, RV 151% predicted consistent with air trapping and a DLCO of 44% predicted.  Patient's last chest x-ray was completed in March 2022 small amount of atelectasis with associated right-sided effusion.  OV 02/05/2021: Patient has no significant respiratory complaints today except for ongoing dry cough.  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  He is no longer doing this but has had a significant occupational dust exposure throughout his life.  OV 05/11/2021: Here today for follow-up after hospitalization for TAVR placement and new pacemaker.  Overall he is doing much better since last office visit.  He still feels short of breath with exertion.  Not always using his inhalers daily.  Occasionally using his albuterol nebulizer.  Per the son is also in the office today states that he does look like he breathes better at home when he is using his medicines.  They are going to try to be better about making sure he uses his inhaler.  Overall after all the procedures he  is doing much better.   Past Medical History:  Diagnosis Date   Aortic stenosis    mild AS 09/2017 echo   Cancer [facility])    skin   COPD (chronic obstructive pulmonary disease) (HCC)    Stage 3 per patient's son   Coronary artery disease    a.  s/p CABG;   b. cath 4/12: EF 55%, 3vCAD, patent L-LAD, patent S-RCA, patent S-CFX (done after a false pos. ETT)   Dementia ([place])    Per son   Diverticular disease    GERD (gastroesophageal reflux disease)    GI bleed    Hemorrhoids    HH (hiatus hernia)    History of kidney stones    Hypertension    Osteoarthritis    Other and unspecified hyperlipidemia    Presence of permanent cardiac pacemaker    Schatzki's ring    Stroke [facility])      Family History  Problem Relation Age of Onset   Heart attack Mother    Hypertension Mother    Diabetes Father    Diabetes Brother    Diabetes Sister      Past Surgical History:  Procedure Laterality Date   ARTERIOVENOUS GRAFT PLACEMENT W/ ENDOSCOPIC VEIN HARVEST     of the right leg greater spahenous vein. Surgeon: Tharon Aquas Trigt,M.D.   BACK SURGERY  2017   COLONOSCOPY  02/24/2010   Hemorrhoids, Diverticulosis. Performed at [place]. Normal terminal ileum. Dr. June Leap, [facility]   CORONARY ARTERY BYPASS GRAFT  06/21/2007   CABG x 3  Surgeon Tharon Aquas Trigt, MD   EYE SURGERY     bilateral cataract removal   hip replace  06/09/2004   left hip Surgeon Pietro Cassis. Alvan Dame, MD   INTRAOPERATIVE TRANSTHORACIC ECHOCARDIOGRAM Left 03/03/2021   Procedure: INTRAOPERATIVE TRANSTHORACIC ECHOCARDIOGRAM;  Surgeon: Burnell Blanks, MD;  Location: [place];  Service: Open Heart Surgery;  Laterality: Left;   LAPAROSCOPIC CHOLECYSTECTOMY  2021   LEFT HEART CATH AND CORS/GRAFTS ANGIOGRAPHY N/A 11/04/2020   Procedure: LEFT HEART CATH AND CORS/GRAFTS ANGIOGRAPHY;  Surgeon: Troy Sine, MD;  Location: [place] CV LAB;  Service: Cardiovascular;  Laterality: N/A;   MULTIPLE EXTRACTIONS WITH ALVEOLOPLASTY N/A  02/03/2021   Procedure: MULTIPLE EXTRACTION WITH ALVEOLOPLASTY;  Surgeon: Charlaine Dalton, DMD;  Location: [place];  Service: Dentistry;  Laterality: N/A;   PACEMAKER IMPLANT N/A 10/03/2017   Procedure: PACEMAKER IMPLANT;  Surgeon: Evans Lance, MD;  Location: [place] CV LAB;  Service: Cardiovascular;  Laterality: N/A;   PACEMAKER LEAD REMOVAL N/A 04/30/2021   Procedure: PACEMAKER LEAD REMOVAL AND REPLACMENT;  Surgeon: Evans Lance, MD;  Location: [place];  Service: Cardiovascular;  Laterality: N/A;   REVERSE SHOULDER ARTHROPLASTY Right 08/25/2018   Procedure: REVERSE SHOULDER ARTHROPLASTY;  Surgeon: Netta Cedars, MD;  Location: [place];  Service: Orthopedics;  Laterality: Right;   TRANSCATHETER AORTIC VALVE REPLACEMENT, TRANSFEMORAL Bilateral 03/03/2021   Procedure: TRANSCATHETER AORTIC VALVE REPLACEMENT, TRANSFEMORAL;  Surgeon: Burnell Blanks, MD;  Location: [place];  Service: Open Heart Surgery;  Laterality: Bilateral;   ULTRASOUND GUIDANCE FOR VASCULAR ACCESS Bilateral 03/03/2021   Procedure: ULTRASOUND GUIDANCE FOR VASCULAR ACCESS;  Surgeon: Burnell Blanks, MD;  Location: [place];  Service: Open Heart Surgery;  Laterality: Bilateral;    Social History   Socioeconomic History   Marital status: Married    Spouse name: Not on file   Number of children: 2   Years of education: Not on file   Highest education level: Not on file  Occupational History   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 Daily Caffeine: 24 oz pepsi and 1 cup coffee.    Social Determinants of Health   Financial Resource Strain: Not on file  Food Insecurity: Not on file  Transportation Needs: Not on file   Physical Activity: Not on file  Stress: Not on file  Social Connections: Not on file  Intimate Partner Violence: Not on file     No Known Allergies   Outpatient Medications Prior to Visit  Medication Sig Dispense Refill   acetaminophen (TYLENOL) 500 MG tablet Take 1,000 mg by mouth every 8 (eight) hours as needed for moderate pain.     albuterol (PROVENTIL) (2.5 MG/3ML) 0.083% nebulizer solution Take 3 mLs (2.5 mg total) by nebulization every 6 (six) hours as needed for wheezing or shortness of breath. 75 mL 12   amLODipine (NORVASC) 2.5 MG tablet Take 2.5 mg by mouth in the morning.     apixaban (ELIQUIS) 5 MG TABS tablet TAKE 1 TABLET BY MOUTH TWICE A DAY 60 tablet 5   aspirin 81 MG chewable tablet Chew 1 tablet (81 mg total) by mouth daily.  60 tablet 2   atorvastatin (LIPITOR) 40 MG tablet Take 40 mg by mouth in the morning.     cyanocobalamin (,VITAMIN B-12,) 1000 MCG/ML injection Inject 1,000 mcg into the muscle every 30 (thirty) days.     donepezil (ARICEPT) 10 MG tablet Take 10 mg by mouth at bedtime.     enalapril (VASOTEC) 20 MG tablet Take 20 mg by mouth 2 (two) times daily.     Fluticasone-Umeclidin-Vilant (TRELEGY ELLIPTA) 100-62.5-25 MCG/INH AEPB Inhale 1 puff into the lungs daily. Rinse mouth after 60 each 1   furosemide (LASIX) 40 MG tablet Take 40 mg by mouth in the morning.     isosorbide mononitrate (IMDUR) 60 MG 24 hr tablet Take 1 tablet (60 mg total) by mouth daily. 90 tablet 3   Lidocaine 4 % PTCH Apply 1 patch topically daily as needed (pain).     metoprolol succinate (TOPROL-XL) 50 MG 24 hr tablet Take 1 tablet (50 mg total) by mouth daily. 90 tablet 3   nitroGLYCERIN (NITROSTAT) 0.4 MG SL tablet Place 1 tablet (0.4 mg total) under the tongue every 5 (five) minutes as needed for chest pain. 25 tablet 3   omeprazole (PRILOSEC) 20 MG capsule Take 20 mg by mouth in the morning.     tamsulosin (FLOMAX) 0.4 MG CAPS capsule Take 0.4 mg by mouth in the morning.     No  facility-administered medications prior to visit.    Review of Systems  Constitutional:  Negative for chills, fever, malaise/fatigue and weight loss.  HENT:  Negative for hearing loss, sore throat and tinnitus.   Eyes:  Negative for blurred vision and double vision.  Respiratory:  Positive for cough and shortness of breath. Negative for hemoptysis, sputum production, wheezing and stridor.   Cardiovascular:  Negative for chest pain, palpitations, orthopnea, leg swelling and PND.  Gastrointestinal:  Negative for abdominal pain, constipation, diarrhea, heartburn, nausea and vomiting.  Genitourinary:  Negative for dysuria, hematuria and urgency.  Musculoskeletal:  Negative for joint pain and myalgias.  Skin:  Negative for itching and rash.  Neurological:  Negative for dizziness, tingling, weakness and headaches.  Endo/Heme/Allergies:  Negative for environmental allergies. Does not bruise/bleed easily.  Psychiatric/Behavioral:  Negative for depression. The patient is not nervous/anxious and does not have insomnia.   All other systems reviewed and are negative.   Objective:  Physical Exam Vitals reviewed.  Constitutional:      General: He is not in acute distress.    Appearance: He is well-developed. He is obese.     Comments: Elderly male, frail, hard of hearing  HENT:     Head: Normocephalic and atraumatic.  Eyes:     General: No scleral icterus.    Pupils: Pupils are equal, round, and reactive to light.  Neck:     Vascular: No JVD.     Trachea: No tracheal deviation.  Cardiovascular:     Rate and Rhythm: Normal rate and regular rhythm.     Heart sounds: Normal heart sounds. No murmur heard. Pulmonary:     Effort: Pulmonary effort is normal. No tachypnea, accessory muscle usage or respiratory distress.     Breath sounds: No stridor. No wheezing, rhonchi or rales.  Abdominal:     General: Bowel sounds are normal. There is no distension.     Palpations: Abdomen is soft.      Tenderness: There is no abdominal tenderness.  Musculoskeletal:        General: No tenderness.  Cervical back: Neck supple.  Lymphadenopathy:     Cervical: No cervical adenopathy.  Skin:    General: Skin is warm and dry.     Capillary Refill: Capillary refill takes less than 2 seconds.     Findings: No rash.  Neurological:     Mental Status: He is alert and oriented to person, place, and time.  Psychiatric:        Behavior: Behavior normal.     Vitals:   05/11/21 1055  BP: 116/72  Pulse: 60  SpO2: 99%  Weight: 162 lb 12.8 oz (73.8 kg)  Height: '5\' 9"'$  (1.753 m)   99% on RA BMI Readings from Last 3 Encounters:  05/11/21 24.04 kg/m  04/30/21 23.63 kg/m  04/08/21 23.72 kg/m   Wt Readings from Last 3 Encounters:  05/11/21 162 lb 12.8 oz (73.8 kg)  04/30/21 160 lb (72.6 kg)  04/08/21 160 lb 9.6 oz (72.8 kg)     CBC    Component Value Date/Time   WBC 8.7 04/08/2021 1453   WBC 7.7 03/26/2021 1607   RBC 3.24 (L) 04/08/2021 1453   RBC 3.14 (L) 03/26/2021 1607   HGB 10.2 (L) 04/08/2021 1453   HCT 31.8 (L) 04/08/2021 1453   PLT 243 04/08/2021 1453   MCV 98 (H) 04/08/2021 1453   MCH 31.5 04/08/2021 1453   MCH 32.2 03/26/2021 1607   MCHC 32.1 04/08/2021 1453   MCHC 32.9 03/26/2021 1607   RDW 13.0 04/08/2021 1453   LYMPHSABS 3.0 04/08/2021 1453   MONOABS 0.8 03/26/2021 1607   EOSABS 0.5 (H) 04/08/2021 1453   BASOSABS 0.0 04/08/2021 1453     Chest Imaging: No recent axial chest imaging. Chest x-ray from March 2022 reviewed: Bibasilar areas of interstitial markings.  Possible right lower lobe mild atelectasis, small effusion. The patient's images have been independently reviewed by me.    Pulmonary Functions Testing Results: PFT Results Latest Ref Rng & Units 01/14/2021  FVC-Pre L 2.85  FVC-Predicted Pre % 79  FVC-Post L 2.97  FVC-Predicted Post % 82  Pre FEV1/FVC % % 45  Post FEV1/FCV % % 34  FEV1-Pre L 1.29  FEV1-Predicted Pre % 51  FEV1-Post L 1.00   DLCO uncorrected ml/min/mmHg 10.20  DLCO UNC% % 44  DLVA Predicted % 62  TLC L 7.08  TLC % Predicted % 102  RV % Predicted % 151    FeNO:   Pathology:   Echocardiogram:   12/10/2020: IMPRESSIONS   1. Left ventricular ejection fraction, by estimation, is 45 to 50%. The  left ventricle has mildly decreased function. The left ventricle  demonstrates regional wall motion abnormalities (see scoring  diagram/findings for description). Left ventricular  diastolic parameters are consistent with Grade II diastolic dysfunction  (pseudonormalization). Elevated left atrial pressure. There is moderate  hypokinesis of the left ventricular, basal inferior wall and inferolateral  wall.   2. Right ventricular systolic function is normal. The right ventricular  size is normal. There is normal pulmonary artery systolic pressure. The  estimated right ventricular systolic pressure is 123456 mmHg.   3. Left atrial size was moderately dilated.   4. Right atrial size was moderately dilated.   5. The mitral valve is normal in structure. Mild mitral valve  regurgitation.   6. The aortic valve is tricuspid. There is severe calcifcation of the  aortic valve. There is severe thickening of the aortic valve. Aortic valve  regurgitation is not visualized. Moderate to severe aortic valve stenosis.   7.  The inferior vena cava is normal in size with greater than 50%  respiratory variability, suggesting right atrial pressure of 3 mmHg.   Heart Catheterization:  11/04/2020 Mid LM to Prox LAD lesion is 95% stenosed. Mid LAD lesion is 100% stenosed. Dist LAD lesion is 25% stenosed. Ost Cx to Prox Cx lesion is 80% stenosed. Mid Cx lesion is 90% stenosed. 2nd Mrg lesion is 50% stenosed. Prox RCA lesion is 95% stenosed. Mid RCA lesion is 100% stenosed.     Assessment & Plan:     ICD-10-CM   1. Stage 3 severe COPD by GOLD classification ([place])  J44.9     2. DOE (dyspnea on exertion)  R06.00     3. 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  Had PFTs with severe obstructive disease with reduced FEV1 FVC ratio and associated mixed obstructive restrictive pattern.  His DLCO is also low and his CT does have some evidence of emphysema.  He also had recent aortic stenosis corrected by TAVR replacement.  Plan: I reviewed his CT imaging that was completed for his TAVR work-up. He is doing much better on Trelegy as well as breathing wise after his TAVR procedure. Has not been using his inhalers regularly. Patient was counseled on this as well as son in the room. Recommend using this daily and continue the use of his albuterol.  He does better with the albuterol mask delivery instead of a pipe mouthpiece delivery.  He was able to use the mask during his recent hospitalization and the son felt like that was better. We will try to get him 1 of these.  Patient return to clinic to see [place] in 6 months or as needed.    Current Outpatient Medications:    acetaminophen (TYLENOL) 500 MG tablet, Take 1,000 mg by mouth every 8 (eight) hours as needed for moderate pain., Disp: , Rfl:    albuterol (PROVENTIL) (2.5 MG/3ML) 0.083% nebulizer solution, Take 3 mLs (2.5 mg total) by nebulization every 6 (six) hours as needed for wheezing or shortness of breath., Disp: 75 mL, Rfl: 12   amLODipine (NORVASC) 2.5 MG tablet, Take 2.5 mg by mouth in the morning., Disp: , Rfl:    apixaban (ELIQUIS) 5 MG TABS tablet, TAKE 1 TABLET BY MOUTH TWICE A DAY, Disp: 60 tablet, Rfl: 5   aspirin 81 MG chewable tablet, Chew 1 tablet (81 mg total) by mouth daily., Disp: 60 tablet, Rfl: 2   atorvastatin (LIPITOR) 40 MG tablet, Take 40 mg by mouth in the morning., Disp: , Rfl:    cyanocobalamin (,VITAMIN B-12,) 1000 MCG/ML injection, Inject 1,000 mcg into the muscle every 30  (thirty) days., Disp: , Rfl:    donepezil (ARICEPT) 10 MG tablet, Take 10 mg by mouth at bedtime., Disp: , Rfl:    enalapril (VASOTEC) 20 MG tablet, Take 20 mg by mouth 2 (two) times daily., Disp: , Rfl:    Fluticasone-Umeclidin-Vilant (TRELEGY ELLIPTA) 100-62.5-25 MCG/INH AEPB, Inhale 1 puff into the lungs daily. Rinse mouth after, Disp: 60 each, Rfl: 1   furosemide (LASIX) 40 MG tablet, Take 40 mg by mouth in the morning., Disp: , Rfl:    isosorbide mononitrate (IMDUR) 60 MG 24 hr tablet, Take 1 tablet (60 mg total) by mouth daily., Disp: 90 tablet, Rfl: 3   Lidocaine 4 % PTCH, Apply 1  patch topically daily as needed (pain)., Disp: , Rfl:    metoprolol succinate (TOPROL-XL) 50 MG 24 hr tablet, Take 1 tablet (50 mg total) by mouth daily., Disp: 90 tablet, Rfl: 3   nitroGLYCERIN (NITROSTAT) 0.4 MG SL tablet, Place 1 tablet (0.4 mg total) under the tongue every 5 (five) minutes as needed for chest pain., Disp: 25 tablet, Rfl: 3   omeprazole (PRILOSEC) 20 MG capsule, Take 20 mg by mouth in the morning., Disp: , Rfl:    tamsulosin (FLOMAX) 0.4 MG CAPS capsule, Take 0.4 mg by mouth in the morning., Disp: , Rfl:     Garner Nash, DO [facility] 05/11/2021 11:13 AM

## 2021-05-11 NOTE — Addendum Note (Signed)
Addended by: Valerie Salts on: 05/11/2021 11:22 AM   Modules accepted: Orders

## 2021-05-11 NOTE — Patient Instructions (Signed)
Thank you for visiting Dr. Valeta Harms at [facility]. Today we recommend the following:  Trelegy daily  New mask for nebulilzer  Albuterol as needed every 6 hours   Return in about 6 months (around 11/08/2021) for with APP or Dr. Valeta Harms.    Please do your part to reduce the spread of COVID-19.

## 2021-05-13 ENCOUNTER — Other Ambulatory Visit: Payer: Self-pay

## 2021-05-13 ENCOUNTER — Ambulatory Visit (INDEPENDENT_AMBULATORY_CARE_PROVIDER_SITE_OTHER): Payer: Medicare HMO

## 2021-05-13 DIAGNOSIS — I442 Atrioventricular block, complete: Secondary | ICD-10-CM | POA: Diagnosis not present

## 2021-05-13 LAB — CUP PACEART INCLINIC DEVICE CHECK
Battery Remaining Longevity: 69 mo
Battery Voltage: 2.96 V
Brady Statistic AP VP Percent: 33.48 %
Brady Statistic AP VS Percent: 0.02 %
Brady Statistic AS VP Percent: 64.89 %
Brady Statistic AS VS Percent: 1.62 %
Brady Statistic RA Percent Paced: 34.37 %
Brady Statistic RV Percent Paced: 98.37 %
Date Time Interrogation Session: 20220921141044
Implantable Lead Implant Date: 20190211
Implantable Lead Implant Date: 20190211
Implantable Lead Location: 753859
Implantable Lead Location: 753860
Implantable Lead Model: 3830
Implantable Lead Model: 5076
Implantable Pulse Generator Implant Date: 20190211
Lead Channel Impedance Value: 228 Ohm
Lead Channel Impedance Value: 247 Ohm
Lead Channel Impedance Value: 323 Ohm
Lead Channel Impedance Value: 342 Ohm
Lead Channel Pacing Threshold Amplitude: 0.75 V
Lead Channel Pacing Threshold Amplitude: 1 V
Lead Channel Pacing Threshold Pulse Width: 0.4 ms
Lead Channel Pacing Threshold Pulse Width: 0.4 ms
Lead Channel Sensing Intrinsic Amplitude: 2 mV
Lead Channel Sensing Intrinsic Amplitude: 2.25 mV
Lead Channel Setting Pacing Amplitude: 2.25 V
Lead Channel Setting Pacing Amplitude: 2.5 V
Lead Channel Setting Pacing Pulse Width: 0.4 ms
Lead Channel Setting Sensing Sensitivity: 0.6 mV

## 2021-05-13 NOTE — Progress Notes (Signed)
Wound check appointment for RA lead/pocket revision. Steri-strips removed. Wound without redness or edema. Incision edges approximated, wound well healed. Normal device function. Thresholds, sensing, and impedances consistent with implant measurements. Device programmed at  appropriate safety margin for chronic leads/ RV lead programmed with adaptive output on.  Histogram distribution appropriate for patient and level of activity. No mode switches or high ventricular rates noted. Patient educated about wound care, arm mobility, lifting restrictions. Patient is enrolled in remote monitoring, next scheduled check 07/15/21.  ROV with Dr. Lovena Le on 08/04/21.

## 2021-05-13 NOTE — Patient Instructions (Signed)

## 2021-07-15 ENCOUNTER — Ambulatory Visit (INDEPENDENT_AMBULATORY_CARE_PROVIDER_SITE_OTHER): Payer: Medicare HMO

## 2021-07-15 DIAGNOSIS — I442 Atrioventricular block, complete: Secondary | ICD-10-CM

## 2021-07-19 LAB — CUP PACEART REMOTE DEVICE CHECK
Battery Remaining Longevity: 30 mo
Battery Voltage: 2.91 V
Brady Statistic AP VP Percent: 64.65 %
Brady Statistic AP VS Percent: 0.02 %
Brady Statistic AS VP Percent: 33.06 %
Brady Statistic AS VS Percent: 2.28 %
Brady Statistic RA Percent Paced: 66.15 %
Brady Statistic RV Percent Paced: 97.71 %
Date Time Interrogation Session: 20221124001059
Implantable Lead Implant Date: 20190211
Implantable Lead Implant Date: 20190211
Implantable Lead Location: 753859
Implantable Lead Location: 753860
Implantable Lead Model: 3830
Implantable Lead Model: 5076
Implantable Pulse Generator Implant Date: 20190211
Lead Channel Impedance Value: 209 Ohm
Lead Channel Impedance Value: 228 Ohm
Lead Channel Impedance Value: 285 Ohm
Lead Channel Impedance Value: 342 Ohm
Lead Channel Pacing Threshold Amplitude: 1.875 V
Lead Channel Pacing Threshold Amplitude: 2.375 V
Lead Channel Pacing Threshold Pulse Width: 0.4 ms
Lead Channel Pacing Threshold Pulse Width: 0.4 ms
Lead Channel Sensing Intrinsic Amplitude: 0.5 mV
Lead Channel Sensing Intrinsic Amplitude: 0.5 mV
Lead Channel Sensing Intrinsic Amplitude: 1.875 mV
Lead Channel Sensing Intrinsic Amplitude: 1.875 mV
Lead Channel Setting Pacing Amplitude: 2.5 V
Lead Channel Setting Pacing Amplitude: 5 V
Lead Channel Setting Pacing Pulse Width: 0.4 ms
Lead Channel Setting Sensing Sensitivity: 0.6 mV

## 2021-07-23 DIAGNOSIS — I480 Paroxysmal atrial fibrillation: Secondary | ICD-10-CM | POA: Insufficient documentation

## 2021-07-23 NOTE — Progress Notes (Signed)
Cardiology Office Note   Date:  07/24/2021   ID:  Blake Burgess, Blake Burgess April 29, 1932, MRN [id number]  PCP:  Raelene Bott, MD  Cardiologist:   Minus Breeding, MD   Chief Complaint  Patient presents with   Shortness of Breath       History of Present Illness: Blake Burgess is a 87 y.o. male who presents for follow up of CAD and CABG.  In 2012 he did have an abnormal stress test followed by catheterization which demonstrated patent bypass grafts.  His last stress in 2017 was unremarkable.  In Feb 2019 he had chest pain and SOB and came to the ED and was noted to be in CHB.  He had a pacemaker placed.  He was again in the hospital in late April 2022 at [facility] with chest pain.  There was no objective evidence of ischemia and he was not admitted.  An echocardiogram suggested that his EF was 45 to 50% which is about the same or perhaps mildly reduced from below.  There were no regional wall motion abnormalities.  He has some mild mitral vegetation.  He has severe thickening of his aortic valve his mean gradient is 21.9.  This had progressed since 2019.  He underwent a successful TAVR with a 26 mm Edwards Sapien 3 Ultra THV via the TF approach on 03/03/21.  He had revision of his pacemaker for an atrial lead malfunction in Sept.    He returns for follow up.  Unfortunately still dyspneic.  He gets around slowly.  He is seen by pulmonary as well for COPD.  Just recently he had blood work done by his primary provider and I was able to review this.  His BNP was 6750.  He was told to increase his Lasix to 80 a day.  He does unfortunately continue to have shortness of breath with mild activity.  He does do some activity around the house like trying to push the vacuum cleaner.  In warmer weather he will go on rides a lawnmower.  He is not having any PND or orthopnea.  He is not having any chest pressure, neck or arm discomfort.  He said no weight gain or edema.  Of note when he walked around the office he was  quite dyspneic and his heart rate went up but his oxygen saturation was stayed above 93.   Past Medical History:  Diagnosis Date   Aortic stenosis    mild AS 09/2017 echo   Cancer [facility])    skin   COPD (chronic obstructive pulmonary disease) (HCC)    Stage 3 per patient's son   Coronary artery disease    a.  s/p CABG;   b. cath 4/12: EF 55%, 3vCAD, patent L-LAD, patent S-RCA, patent S-CFX (done after a false pos. ETT)   Dementia [facility])    Per son   Diverticular disease    GERD (gastroesophageal reflux disease)    GI bleed    Hemorrhoids    HH (hiatus hernia)    History of kidney stones    Hypertension    Osteoarthritis    Other and unspecified hyperlipidemia    Presence of permanent cardiac pacemaker    Schatzki's ring    Stroke [facility])     Past Surgical History:  Procedure Laterality Date   ARTERIOVENOUS GRAFT PLACEMENT W/ ENDOSCOPIC VEIN HARVEST     of the right leg greater spahenous vein. Surgeon: Tharon Aquas Trigt,M.D.   BACK SURGERY  2017  COLONOSCOPY  02/24/2010   Hemorrhoids, Diverticulosis. Performed at [place]. Normal terminal ileum. Dr. June Leap, [place] ARTERY BYPASS GRAFT  06/21/2007   CABG x 3 Surgeon Ivin Poot, MD   EYE SURGERY     bilateral cataract removal   hip replace  06/09/2004   left hip Surgeon Pietro Cassis. Alvan Dame, MD   INTRAOPERATIVE TRANSTHORACIC ECHOCARDIOGRAM Left 03/03/2021   Procedure: INTRAOPERATIVE TRANSTHORACIC ECHOCARDIOGRAM;  Surgeon: Burnell Blanks, MD;  Location: [place];  Service: Open Heart Surgery;  Laterality: Left;   LAPAROSCOPIC CHOLECYSTECTOMY  2021   LEFT HEART CATH AND CORS/GRAFTS ANGIOGRAPHY N/A 11/04/2020   Procedure: LEFT HEART CATH AND CORS/GRAFTS ANGIOGRAPHY;  Surgeon: Troy Sine, MD;  Location: [place] CV LAB;  Service: Cardiovascular;  Laterality: N/A;   MULTIPLE EXTRACTIONS WITH ALVEOLOPLASTY N/A 02/03/2021   Procedure: MULTIPLE EXTRACTION WITH ALVEOLOPLASTY;  Surgeon: Charlaine Dalton, DMD;   Location: [place];  Service: Dentistry;  Laterality: N/A;   PACEMAKER IMPLANT N/A 10/03/2017   Procedure: PACEMAKER IMPLANT;  Surgeon: Evans Lance, MD;  Location: [place] CV LAB;  Service: Cardiovascular;  Laterality: N/A;   PACEMAKER LEAD REMOVAL N/A 04/30/2021   Procedure: PACEMAKER LEAD REMOVAL AND REPLACMENT;  Surgeon: Evans Lance, MD;  Location: [place];  Service: Cardiovascular;  Laterality: N/A;   REVERSE SHOULDER ARTHROPLASTY Right 08/25/2018   Procedure: REVERSE SHOULDER ARTHROPLASTY;  Surgeon: Netta Cedars, MD;  Location: [place];  Service: Orthopedics;  Laterality: Right;   TRANSCATHETER AORTIC VALVE REPLACEMENT, TRANSFEMORAL Bilateral 03/03/2021   Procedure: TRANSCATHETER AORTIC VALVE REPLACEMENT, TRANSFEMORAL;  Surgeon: Burnell Blanks, MD;  Location: [place];  Service: Open Heart Surgery;  Laterality: Bilateral;   ULTRASOUND GUIDANCE FOR VASCULAR ACCESS Bilateral 03/03/2021   Procedure: ULTRASOUND GUIDANCE FOR VASCULAR ACCESS;  Surgeon: Burnell Blanks, MD;  Location: [place];  Service: Open Heart Surgery;  Laterality: Bilateral;     Current Outpatient Medications  Medication Sig Dispense Refill   acetaminophen (TYLENOL) 500 MG tablet Take 1,000 mg by mouth every 8 (eight) hours as needed for moderate pain.     albuterol (PROVENTIL) (2.5 MG/3ML) 0.083% nebulizer solution Take 3 mLs (2.5 mg total) by nebulization every 6 (six) hours as needed for wheezing or shortness of breath. 75 mL 12   amLODipine (NORVASC) 2.5 MG tablet Take 2.5 mg by mouth in the morning.     apixaban (ELIQUIS) 5 MG TABS tablet TAKE 1 TABLET BY MOUTH TWICE A DAY 60 tablet 5   aspirin 81 MG chewable tablet Chew 1 tablet (81 mg total) by mouth daily. 60 tablet 2   atorvastatin (LIPITOR) 40 MG tablet Take 40 mg by mouth in the morning.     cyanocobalamin (,VITAMIN B-12,) 1000 MCG/ML injection Inject 1,000 mcg into the muscle every 30 (thirty) days.     donepezil (ARICEPT) 10 MG tablet Take 10 mg  by mouth at bedtime.     enalapril (VASOTEC) 20 MG tablet Take 20 mg by mouth 2 (two) times daily.     Fluticasone-Umeclidin-Vilant (TRELEGY ELLIPTA) 100-62.5-25 MCG/INH AEPB Inhale 1 puff into the lungs daily. Rinse mouth after 60 each 1   furosemide (LASIX) 80 MG tablet Take 1 tablet (80 mg total) by mouth daily. 90 tablet 3   isosorbide mononitrate (IMDUR) 60 MG 24 hr tablet Take 1 tablet (60 mg total) by mouth daily. 90 tablet 3   Lidocaine 4 % PTCH Apply 1 patch topically daily as needed (pain).     metoprolol  succinate (TOPROL-XL) 50 MG 24 hr tablet Take 1 tablet (50 mg total) by mouth daily. 90 tablet 3   nitroGLYCERIN (NITROSTAT) 0.4 MG SL tablet Place 1 tablet (0.4 mg total) under the tongue every 5 (five) minutes as needed for chest pain. 25 tablet 3   omeprazole (PRILOSEC) 20 MG capsule Take 20 mg by mouth in the morning.     tamsulosin (FLOMAX) 0.4 MG CAPS capsule Take 0.4 mg by mouth in the morning.     No current facility-administered medications for this visit.    Allergies:   Patient has no known allergies.    ROS:  Please see the history of present illness.   Otherwise, review of systems are positive for none .   All other systems are reviewed and negative.    PHYSICAL EXAM: VS:  BP (!) 169/69   Pulse 68   Ht 5\' 9"  (1.753 m)   Wt 169 lb 12.8 oz (77 kg)   SpO2 97%   BMI 25.08 kg/m  , BMI Body mass index is 25.08 kg/m.  GENERAL:  Well appearing NECK:  No jugular venous distention, waveform within normal limits, carotid upstroke brisk and symmetric, no bruits, no thyromegaly LUNGS:  Clear to auscultation bilaterally CHEST:  Well healed pacemaker pocket.  Well healed sternotomy scar. HEART:  PMI not displaced or sustained,S1 and S2 within normal limits, no S3, no S4, no clicks, no rubs, no murmurs ABD:  Flat, positive bowel sounds normal in frequency in pitch, no bruits, no rebound, no guarding, no midline pulsatile mass, no hepatomegaly, no splenomegaly EXT:  2 plus  pulses throughout, no edema, no cyanosis no clubbing    EKG:  EKG is not ordered today.   Diagnostic cath Dominance: Right     Recent Labs: 02/27/2021: ALT 15 03/04/2021: Magnesium 2.0 03/26/2021: B Natriuretic Peptide 409.8 04/08/2021: BUN 37; Creatinine, Ser 1.66; Hemoglobin 10.2; Platelets 243; Potassium 4.7; Sodium 140    Lipid Panel    Component Value Date/Time   CHOL 112 10/01/2017 0619   TRIG 68 10/01/2017 0619   HDL 39 (L) 10/01/2017 0619   CHOLHDL 2.9 10/01/2017 0619   VLDL 14 10/01/2017 0619   LDLCALC 59 10/01/2017 0619      Wt Readings from Last 3 Encounters:  07/24/21 169 lb 12.8 oz (77 kg)  05/11/21 162 lb 12.8 oz (73.8 kg)  04/30/21 160 lb (72.6 kg)      Other studies Reviewed: Additional studies/ records that were reviewed today include: Hospital records, pulmonary records and primary care labs Review of the above records demonstrates:  Please see elsewhere in the note.     ASSESSMENT AND PLAN:   CHB/PACEMAKER PLACEMENT:      He is status post revision.     ATRIAL FIB:      [name] has a CHA2DS2 - VASc score of 5.   No change in therapy.  Of note if his creatinine goes up he would have to have his dose of Eliquis reduced but he is not yet at that threshold.   CHEST PAIN:  .  He is really not complaining of this today.  He had patent grafts as above.  HTN:     His blood pressure is elevated but this is unusual.  I have asked him to keep a blood pressure check twice a day for couple of weeks and send me the diary results as he might need med titration.   CHRONIC SYSTOLIC AND DIASTOLIC HF:  I do not hear extra fluid but he certainly does have an elevated BNP.  His primary care increased his Lasix to 80 a day.  I have asked the patient to make sure he goes back and gets a basic metabolic profile in a week to 10 days.  We talked about salt restriction.    AS/TAVR:  He is status post TAVR.    He had normal TAVR function in August.  He is going  to have follow-up in [facility] next year.    Current medicines are reviewed at length with the patient today.  The patient does not have concerns regarding medicines.  The following changes have been made:   None  Labs/ tests ordered today include:  None  No orders of the defined types were placed in this encounter.    Disposition:   FU with me or APP in six months.    Signed, Minus Breeding, MD  07/24/2021 10:48 AM    [facility]

## 2021-07-24 ENCOUNTER — Encounter: Payer: Self-pay | Admitting: Cardiology

## 2021-07-24 ENCOUNTER — Other Ambulatory Visit: Payer: Self-pay

## 2021-07-24 ENCOUNTER — Ambulatory Visit: Payer: Medicare HMO | Admitting: Cardiology

## 2021-07-24 VITALS — BP 169/69 | HR 68 | Ht 69.0 in | Wt 169.8 lb

## 2021-07-24 DIAGNOSIS — I10 Essential (primary) hypertension: Secondary | ICD-10-CM | POA: Diagnosis not present

## 2021-07-24 DIAGNOSIS — I480 Paroxysmal atrial fibrillation: Secondary | ICD-10-CM

## 2021-07-24 DIAGNOSIS — R0602 Shortness of breath: Secondary | ICD-10-CM

## 2021-07-24 DIAGNOSIS — I2510 Atherosclerotic heart disease of native coronary artery without angina pectoris: Secondary | ICD-10-CM

## 2021-07-24 MED ORDER — FUROSEMIDE 80 MG PO TABS: 80.0000 mg | ORAL_TABLET | Freq: Every day | ORAL | 3 refills | Status: DC

## 2021-07-24 NOTE — Patient Instructions (Signed)
Medication Instructions:  Take furosemide 80 mg once a day. *If you need a refill on your cardiac medications before your next appointment, please call your pharmacy*    Follow-Up: At [facility], you and your health needs are our priority.  As part of our continuing mission to provide you with exceptional heart care, we have created designated Provider Care Teams.  These Care Teams include your primary Cardiologist (physician) and Advanced Practice Providers (APPs -  Physician Assistants and Nurse Practitioners) who all work together to provide you with the care you need, when you need it.  We recommend signing up for the patient portal called "MyChart".  Sign up information is provided on this After Visit Summary.  MyChart is used to connect with patients for Virtual Visits (Telemedicine).  Patients are able to view lab/test results, encounter notes, upcoming appointments, etc.  Non-urgent messages can be sent to your provider as well.   To learn more about what you can do with MyChart, go to [email or web address].    Your next appointment:   6 month(s)  The format for your next appointment:   In Person  Provider:   APP   Other Instructions Keep a blood pressure diary. Take blood pressure two times a day and record it.

## 2021-07-27 NOTE — Progress Notes (Signed)
Remote pacemaker transmission.   

## 2021-08-04 ENCOUNTER — Ambulatory Visit: Payer: Medicare HMO | Admitting: Internal Medicine

## 2021-08-04 ENCOUNTER — Other Ambulatory Visit: Payer: Self-pay

## 2021-08-04 ENCOUNTER — Encounter: Payer: Self-pay | Admitting: Internal Medicine

## 2021-08-04 VITALS — BP 94/62 | HR 61 | Ht 69.0 in | Wt 164.0 lb

## 2021-08-04 DIAGNOSIS — I2510 Atherosclerotic heart disease of native coronary artery without angina pectoris: Secondary | ICD-10-CM

## 2021-08-04 DIAGNOSIS — I442 Atrioventricular block, complete: Secondary | ICD-10-CM

## 2021-08-04 DIAGNOSIS — Z951 Presence of aortocoronary bypass graft: Secondary | ICD-10-CM | POA: Diagnosis not present

## 2021-08-04 DIAGNOSIS — Z950 Presence of cardiac pacemaker: Secondary | ICD-10-CM

## 2021-08-04 DIAGNOSIS — T82110D Breakdown (mechanical) of cardiac electrode, subsequent encounter: Secondary | ICD-10-CM

## 2021-08-04 NOTE — Progress Notes (Signed)
HPI [name] returns today for followup. He is a pleasant elderly man with a h/o atrial lead disfunction s/p atrial lead extraction and insertion of a new atrial lead. He has been stable in the interim though he admits to being sedentary. No chest pain or sob. Noedema. No trouble healing.  No Known Allergies   Current Outpatient Medications  Medication Sig Dispense Refill   acetaminophen (TYLENOL) 500 MG tablet Take 1,000 mg by mouth every 8 (eight) hours as needed for moderate pain.     albuterol (PROVENTIL) (2.5 MG/3ML) 0.083% nebulizer solution Take 3 mLs (2.5 mg total) by nebulization every 6 (six) hours as needed for wheezing or shortness of breath. 75 mL 12   amLODipine (NORVASC) 2.5 MG tablet Take 2.5 mg by mouth in the morning.     apixaban (ELIQUIS) 5 MG TABS tablet TAKE 1 TABLET BY MOUTH TWICE A DAY 60 tablet 5   aspirin 81 MG chewable tablet Chew 1 tablet (81 mg total) by mouth daily. 60 tablet 2   atorvastatin (LIPITOR) 40 MG tablet Take 40 mg by mouth in the morning.     cyanocobalamin (,VITAMIN B-12,) 1000 MCG/ML injection Inject 1,000 mcg into the muscle every 30 (thirty) days.     donepezil (ARICEPT) 10 MG tablet Take 10 mg by mouth at bedtime.     enalapril (VASOTEC) 20 MG tablet Take 20 mg by mouth 2 (two) times daily.     Fluticasone-Umeclidin-Vilant (TRELEGY ELLIPTA) 100-62.5-25 MCG/INH AEPB Inhale 1 puff into the lungs daily. Rinse mouth after 60 each 1   furosemide (LASIX) 40 MG tablet Take 40 mg by mouth daily. Can take extra 40 mg daily as needed for swelling     isosorbide mononitrate (IMDUR) 60 MG 24 hr tablet Take 1 tablet (60 mg total) by mouth daily. 90 tablet 3   Lidocaine 4 % PTCH Apply 1 patch topically daily as needed (pain).     metoprolol succinate (TOPROL-XL) 50 MG 24 hr tablet Take 1 tablet (50 mg total) by mouth daily. 90 tablet 3   nitroGLYCERIN (NITROSTAT) 0.4 MG SL tablet Place 1 tablet (0.4 mg total) under the tongue every 5 (five) minutes as  needed for chest pain. 25 tablet 3   omeprazole (PRILOSEC) 20 MG capsule Take 20 mg by mouth in the morning.     tamsulosin (FLOMAX) 0.4 MG CAPS capsule Take 0.4 mg by mouth in the morning.     No current facility-administered medications for this visit.     Past Medical History:  Diagnosis Date   Aortic stenosis    mild AS 09/2017 echo   Cancer [facility])    skin   COPD (chronic obstructive pulmonary disease) (HCC)    Stage 3 per patient's son   Coronary artery disease    a.  s/p CABG;   b. cath 4/12: EF 55%, 3vCAD, patent L-LAD, patent S-RCA, patent S-CFX (done after a false pos. ETT)   Dementia ([place])    Per son   Diverticular disease    GERD (gastroesophageal reflux disease)    GI bleed    Hemorrhoids    HH (hiatus hernia)    History of kidney stones    Hypertension    Osteoarthritis    Other and unspecified hyperlipidemia    Presence of permanent cardiac pacemaker    Schatzki's ring    Stroke ([place])     ROS:   All systems reviewed and negative except as noted in the  HPI.   Past Surgical History:  Procedure Laterality Date   ARTERIOVENOUS GRAFT PLACEMENT W/ ENDOSCOPIC VEIN HARVEST     of the right leg greater spahenous vein. Surgeon: Tharon Aquas Trigt,M.D.   BACK SURGERY  2017   COLONOSCOPY  02/24/2010   Hemorrhoids, Diverticulosis. Performed at [place]. Normal terminal ileum. Dr. June Leap, [place] ARTERY BYPASS GRAFT  06/21/2007   CABG x 3 Surgeon Ivin Poot, MD   EYE SURGERY     bilateral cataract removal   hip replace  06/09/2004   left hip Surgeon Pietro Cassis. Alvan Dame, MD   INTRAOPERATIVE TRANSTHORACIC ECHOCARDIOGRAM Left 03/03/2021   Procedure: INTRAOPERATIVE TRANSTHORACIC ECHOCARDIOGRAM;  Surgeon: Burnell Blanks, MD;  Location: [place];  Service: Open Heart Surgery;  Laterality: Left;   LAPAROSCOPIC CHOLECYSTECTOMY  2021   LEFT HEART CATH AND CORS/GRAFTS ANGIOGRAPHY N/A 11/04/2020   Procedure: LEFT HEART CATH AND CORS/GRAFTS ANGIOGRAPHY;   Surgeon: Troy Sine, MD;  Location: [place] CV LAB;  Service: Cardiovascular;  Laterality: N/A;   MULTIPLE EXTRACTIONS WITH ALVEOLOPLASTY N/A 02/03/2021   Procedure: MULTIPLE EXTRACTION WITH ALVEOLOPLASTY;  Surgeon: Charlaine Dalton, DMD;  Location: [place];  Service: Dentistry;  Laterality: N/A;   PACEMAKER IMPLANT N/A 10/03/2017   Procedure: PACEMAKER IMPLANT;  Surgeon: Evans Lance, MD;  Location: [name] CV LAB;  Service: Cardiovascular;  Laterality: N/A;   PACEMAKER LEAD REMOVAL N/A 04/30/2021   Procedure: PACEMAKER LEAD REMOVAL AND REPLACMENT;  Surgeon: Evans Lance, MD;  Location: [place];  Service: Cardiovascular;  Laterality: N/A;   REVERSE SHOULDER ARTHROPLASTY Right 08/25/2018   Procedure: REVERSE SHOULDER ARTHROPLASTY;  Surgeon: Netta Cedars, MD;  Location: [place];  Service: Orthopedics;  Laterality: Right;   TRANSCATHETER AORTIC VALVE REPLACEMENT, TRANSFEMORAL Bilateral 03/03/2021   Procedure: TRANSCATHETER AORTIC VALVE REPLACEMENT, TRANSFEMORAL;  Surgeon: Burnell Blanks, MD;  Location: [place];  Service: Open Heart Surgery;  Laterality: Bilateral;   ULTRASOUND GUIDANCE FOR VASCULAR ACCESS Bilateral 03/03/2021   Procedure: ULTRASOUND GUIDANCE FOR VASCULAR ACCESS;  Surgeon: Burnell Blanks, MD;  Location: [place];  Service: Open Heart Surgery;  Laterality: Bilateral;     Family History  Problem Relation Age of Onset   Heart attack Mother    Hypertension Mother    Diabetes Father    Diabetes Brother    Diabetes Sister      Social History   Socioeconomic History   Marital status: Married    Spouse name: Not on file   Number of children: 2   Years of education: Not on file   Highest education level: Not on file  Occupational History   Occupation: [employer]    Employer: RETIRED  Tobacco Use   Smoking status: Never   Smokeless tobacco: Never  Vaping Use   Vaping Use: Never used  Substance and Sexual Activity   Alcohol use: No   Drug  use: No   Sexual activity: Not on file  Other Topics Concern   Not on file  Social History Narrative   No Regular exercise. Daily Caffeine: 24 oz pepsi and 1 cup coffee.    Social Determinants of Health   Financial Resource Strain: Not on file  Food Insecurity: Not on file  Transportation Needs: Not on file  Physical Activity: Not on file  Stress: Not on file  Social Connections: Not on file  Intimate Partner Violence: Not on file     BP 94/62    Pulse 61    Ht  5\' 9"  (1.753 m)    Wt 164 lb (74.4 kg)    SpO2 96%    BMI 24.22 kg/m   Physical Exam:  Well appearing NAD HEENT: Unremarkable Neck:  No JVD, no thyromegally Lymphatics:  No adenopathy Back:  No CVA tenderness Lungs:  Clear with no wheezes HEART:  Regular rate rhythm, no murmurs, no rubs, no clicks Abd:  soft, positive bowel sounds, no organomegally, no rebound, no guarding Ext:  2 plus pulses, no edema, no cyanosis, no clubbing Skin:  No rashes no nodules Neuro:  CN II through XII intact, motor grossly intact  EKG - NSR with ventricular pacing  DEVICE  Normal device function.  See PaceArt for details.   Assess/Plan:  CHB - he is asymptomatic s/p PPM insertion. His escape is in the 30's. PPM - his Medtronic PPM is working normally except we cannot be certain about his atrial threshold. However he has intrinsic NSR and his atrial sensing is good.  Chronic diastolic heart failure - He will continue his current meds. I have asked him to increase his activity. 4. CAD - he is sedentary but he denies anginal symptoms.    Carleene Overlie Alanson Hausmann,MD

## 2021-08-04 NOTE — Patient Instructions (Signed)
Medication Instructions:  Your physician recommends that you continue on your current medications as directed. Please refer to the Current Medication list given to you today.  Labwork: None ordered.  Testing/Procedures: None ordered.  Follow-Up: Your physician wants you to follow-up in: one year with Cristopher Peru, MD or one of the following Advanced Practice Providers on your designated Care Team:   Tommye Standard, [place] Legrand Como "Jonni Sanger" Chalmers Cater, [place]  Remote monitoring is used to monitor your Pacemaker from home. This monitoring reduces the number of office visits required to check your device to one time per year. It allows [place] to keep an eye on the functioning of your device to ensure it is working properly. You are scheduled for a device check from home on 10/14/2021. You may send your transmission at any time that day. If you have a wireless device, the transmission will be sent automatically. After your physician reviews your transmission, you will receive a postcard with your next transmission date.  Any Other Special Instructions Will Be Listed Below (If Applicable).  If you need a refill on your cardiac medications before your next appointment, please call your pharmacy.

## 2021-08-07 LAB — CUP PACEART INCLINIC DEVICE CHECK
Battery Remaining Longevity: 30 mo
Battery Voltage: 2.91 V
Brady Statistic AP VP Percent: 63.86 %
Brady Statistic AP VS Percent: 0.02 %
Brady Statistic AS VP Percent: 34.28 %
Brady Statistic AS VS Percent: 1.83 %
Brady Statistic RA Percent Paced: 65.06 %
Brady Statistic RV Percent Paced: 98.15 %
Date Time Interrogation Session: 20221213154900
Implantable Lead Implant Date: 20190211
Implantable Lead Implant Date: 20190211
Implantable Lead Location: 753859
Implantable Lead Location: 753860
Implantable Lead Model: 3830
Implantable Lead Model: 5076
Implantable Pulse Generator Implant Date: 20190211
Lead Channel Impedance Value: 209 Ohm
Lead Channel Impedance Value: 228 Ohm
Lead Channel Impedance Value: 323 Ohm
Lead Channel Impedance Value: 342 Ohm
Lead Channel Pacing Threshold Amplitude: 1.875 V
Lead Channel Pacing Threshold Amplitude: 2.375 V
Lead Channel Pacing Threshold Pulse Width: 0.4 ms
Lead Channel Pacing Threshold Pulse Width: 0.4 ms
Lead Channel Sensing Intrinsic Amplitude: 0.5 mV
Lead Channel Sensing Intrinsic Amplitude: 0.875 mV
Lead Channel Sensing Intrinsic Amplitude: 1.25 mV
Lead Channel Sensing Intrinsic Amplitude: 1.875 mV
Lead Channel Setting Pacing Amplitude: 2 V
Lead Channel Setting Pacing Amplitude: 2.5 V
Lead Channel Setting Pacing Pulse Width: 0.4 ms
Lead Channel Setting Sensing Sensitivity: 0.6 mV

## 2021-09-16 NOTE — Progress Notes (Signed)
Cardiology Office Note   Date:  09/17/2021   ID:  [name], [name] November 17, 1931, MRN [id number]  PCP:  Raelene Bott, MD  Cardiologist:   Minus Breeding, MD   Chief Complaint  Patient presents with   Shortness of Breath       History of Present Illness: Blake Burgess is a 88 y.o. male who presents for follow up of CAD and CABG.  In 2012 he did have an abnormal stress test followed by catheterization which demonstrated patent bypass grafts.  His last stress in 2017 was unremarkable.  In Feb 2019 he had chest pain and SOB and came to the ED and was noted to be in CHB.  He had a pacemaker placed.  He was again in the hospital in late April 2022 at [facility] with chest pain.  There was no objective evidence of ischemia and he was not admitted.  An echocardiogram suggested that his EF was 45 to 50% which is about the same or perhaps mildly reduced from below.  There were no regional wall motion abnormalities.  He has some mild mitral vegetation.  He has severe thickening of his aortic valve his mean gradient is 21.9.  This had progressed since 2019.  He underwent a successful TAVR with a 26 mm Edwards Sapien 3 Ultra THV via the TF approach on 03/03/21.  He had revision of his pacemaker for an atrial lead malfunction in Sept.    He returns for follow up.  He is seen by pulmonary as well for COPD.  He had continue dyspnea and he had evidence of increased volume. He was treated with increased Lasix.  His breathing did not get much better.  He was actually in the emergency room at [facility] last week.  I reviewed these records.  He had a loss of vision but some vague description of this.  He had a CT and there was no acute abnormality.  He had small left thalamic lacunar infarct and remote left frontal infarct.  He has scattered areas of white matter disease.  He had chronic small vessel disease.  He had some parenchymal volume loss on the brain.  He might of had another episode of this while he was  riding in a car.  He not had any motor or speech deficits.  He was not hospitalized.  No meds were changed.  Past Medical History:  Diagnosis Date   Aortic stenosis    mild AS 09/2017 echo   Cancer [facility])    skin   COPD (chronic obstructive pulmonary disease) (HCC)    Stage 3 per patient's son   Coronary artery disease    a.  s/p CABG;   b. cath 4/12: EF 55%, 3vCAD, patent L-LAD, patent S-RCA, patent S-CFX (done after a false pos. ETT)   Dementia [facility])    Per son   Diverticular disease    GERD (gastroesophageal reflux disease)    GI bleed    Hemorrhoids    HH (hiatus hernia)    History of kidney stones    Hypertension    Osteoarthritis    Other and unspecified hyperlipidemia    Presence of permanent cardiac pacemaker    Schatzki's ring    Stroke [facility])     Past Surgical History:  Procedure Laterality Date   ARTERIOVENOUS GRAFT PLACEMENT W/ ENDOSCOPIC VEIN HARVEST     of the right leg greater spahenous vein. Surgeon: Tharon Aquas Trigt,M.D.   BACK SURGERY  2017  COLONOSCOPY  02/24/2010   Hemorrhoids, Diverticulosis. Performed at [place]. Normal terminal ileum. Dr. June Leap, [place] ARTERY BYPASS GRAFT  06/21/2007   CABG x 3 Surgeon Ivin Poot, MD   EYE SURGERY     bilateral cataract removal   hip replace  06/09/2004   left hip Surgeon Pietro Cassis. Alvan Dame, MD   INTRAOPERATIVE TRANSTHORACIC ECHOCARDIOGRAM Left 03/03/2021   Procedure: INTRAOPERATIVE TRANSTHORACIC ECHOCARDIOGRAM;  Surgeon: Burnell Blanks, MD;  Location: [place];  Service: Open Heart Surgery;  Laterality: Left;   LAPAROSCOPIC CHOLECYSTECTOMY  2021   LEFT HEART CATH AND CORS/GRAFTS ANGIOGRAPHY N/A 11/04/2020   Procedure: LEFT HEART CATH AND CORS/GRAFTS ANGIOGRAPHY;  Surgeon: Troy Sine, MD;  Location: [place] CV LAB;  Service: Cardiovascular;  Laterality: N/A;   MULTIPLE EXTRACTIONS WITH ALVEOLOPLASTY N/A 02/03/2021   Procedure: MULTIPLE EXTRACTION WITH ALVEOLOPLASTY;  Surgeon: Charlaine Dalton, DMD;  Location: [place];  Service: Dentistry;  Laterality: N/A;   PACEMAKER IMPLANT N/A 10/03/2017   Procedure: PACEMAKER IMPLANT;  Surgeon: Evans Lance, MD;  Location: [place] CV LAB;  Service: Cardiovascular;  Laterality: N/A;   PACEMAKER LEAD REMOVAL N/A 04/30/2021   Procedure: PACEMAKER LEAD REMOVAL AND REPLACMENT;  Surgeon: Evans Lance, MD;  Location: [place];  Service: Cardiovascular;  Laterality: N/A;   REVERSE SHOULDER ARTHROPLASTY Right 08/25/2018   Procedure: REVERSE SHOULDER ARTHROPLASTY;  Surgeon: Netta Cedars, MD;  Location: [place];  Service: Orthopedics;  Laterality: Right;   TRANSCATHETER AORTIC VALVE REPLACEMENT, TRANSFEMORAL Bilateral 03/03/2021   Procedure: TRANSCATHETER AORTIC VALVE REPLACEMENT, TRANSFEMORAL;  Surgeon: Burnell Blanks, MD;  Location: [place];  Service: Open Heart Surgery;  Laterality: Bilateral;   ULTRASOUND GUIDANCE FOR VASCULAR ACCESS Bilateral 03/03/2021   Procedure: ULTRASOUND GUIDANCE FOR VASCULAR ACCESS;  Surgeon: Burnell Blanks, MD;  Location: [place];  Service: Open Heart Surgery;  Laterality: Bilateral;     Current Outpatient Medications  Medication Sig Dispense Refill   acetaminophen (TYLENOL) 500 MG tablet Take 1,000 mg by mouth every 8 (eight) hours as needed for moderate pain.     albuterol (PROVENTIL) (2.5 MG/3ML) 0.083% nebulizer solution Take 3 mLs (2.5 mg total) by nebulization every 6 (six) hours as needed for wheezing or shortness of breath. 75 mL 12   amLODipine (NORVASC) 2.5 MG tablet Take 2.5 mg by mouth in the morning.     apixaban (ELIQUIS) 5 MG TABS tablet TAKE 1 TABLET BY MOUTH TWICE A DAY 60 tablet 5   aspirin 81 MG chewable tablet Chew 1 tablet (81 mg total) by mouth daily. 60 tablet 2   atorvastatin (LIPITOR) 40 MG tablet Take 40 mg by mouth in the morning.     cyanocobalamin (,VITAMIN B-12,) 1000 MCG/ML injection Inject 1,000 mcg into the muscle every 30 (thirty) days.     donepezil (ARICEPT) 10 MG  tablet Take 10 mg by mouth at bedtime.     enalapril (VASOTEC) 20 MG tablet Take 20 mg by mouth 2 (two) times daily.     Fluticasone-Umeclidin-Vilant (TRELEGY ELLIPTA) 100-62.5-25 MCG/INH AEPB Inhale 1 puff into the lungs daily. Rinse mouth after 60 each 1   furosemide (LASIX) 40 MG tablet Take 40 mg by mouth daily. Can take extra 40 mg daily as needed for swelling     isosorbide mononitrate (IMDUR) 60 MG 24 hr tablet Take 1 tablet (60 mg total) by mouth daily. 90 tablet 3   metoprolol succinate (TOPROL-XL) 50 MG 24 hr tablet Take 1 tablet (  50 mg total) by mouth daily. 90 tablet 3   omeprazole (PRILOSEC) 20 MG capsule Take 20 mg by mouth in the morning.     tamsulosin (FLOMAX) 0.4 MG CAPS capsule Take 0.4 mg by mouth in the morning.     amoxicillin (AMOXIL) 500 MG capsule TAKE FOUR CAPSULES (2000 MG) BY MOUTH ONE HOUR BEFORE ANY DENTAL PROCEDURES (Patient not taking: Reported on 09/17/2021)     Lidocaine 4 % PTCH Apply 1 patch topically daily as needed (pain). (Patient not taking: Reported on 09/17/2021)     nitroGLYCERIN (NITROSTAT) 0.4 MG SL tablet Place 1 tablet (0.4 mg total) under the tongue every 5 (five) minutes as needed for chest pain. (Patient not taking: Reported on 09/17/2021) 25 tablet 3   No current facility-administered medications for this visit.    Allergies:   Patient has no known allergies.    ROS:  Please see the history of present illness.   Otherwise, review of systems are positive for none .   All other systems are reviewed and negative.    PHYSICAL EXAM: VS:  BP (!) 140/58 (BP Location: Left Arm, Patient Position: Sitting, Cuff Size: Normal)    Pulse (!) 56    Ht 5\' 9"  (1.753 m)    Wt 176 lb (79.8 kg)    SpO2 98%    BMI 25.99 kg/m  , BMI Body mass index is 25.99 kg/m.  GENERAL: Very frail appearing appearing NECK:  No jugular venous distention, waveform within normal limits, carotid upstroke brisk and symmetric, no bruits, no thyromegaly LUNGS:  Clear to auscultation  bilaterally CHEST:  Unremarkable HEART:  PMI not displaced or sustained,S1 and S2 within normal limits, no S3, no S4, no clicks, no rubs, 2 out of 6 apical systolic murmur radiating slightly at aortic outflow tract murmurs ABD:  Flat, positive bowel sounds normal in frequency in pitch, no bruits, no rebound, no guarding, no midline pulsatile mass, no hepatomegaly, no splenomegaly EXT:  2 plus pulses throughout, trace bilateral ankle edema, no cyanosis no clubbing     EKG:  EKG is  ordered today. Normal sinus rhythm, probable junctional beats, premature atrial contractions, no acute ST-T wave changes  Diagnostic cath Dominance: Right     Recent Labs: 02/27/2021: ALT 15 03/04/2021: Magnesium 2.0 03/26/2021: B Natriuretic Peptide 409.8 04/08/2021: BUN 37; Creatinine, Ser 1.66; Hemoglobin 10.2; Platelets 243; Potassium 4.7; Sodium 140    Lipid Panel    Component Value Date/Time   CHOL 112 10/01/2017 0619   TRIG 68 10/01/2017 0619   HDL 39 (L) 10/01/2017 0619   CHOLHDL 2.9 10/01/2017 0619   VLDL 14 10/01/2017 0619   LDLCALC 59 10/01/2017 0619      Wt Readings from Last 3 Encounters:  09/17/21 176 lb (79.8 kg)  08/04/21 164 lb (74.4 kg)  07/24/21 169 lb 12.8 oz (77 kg)      Other studies Reviewed: Additional studies/ records that were reviewed today include: [facility] records Review of the above records demonstrates:  Please see elsewhere in the note.     ASSESSMENT AND PLAN:   CHB/PACEMAKER PLACEMENT:     He is now status post revision and has normal pacemaker function.   ATRIAL FIB:      Blake Burgess has a CHA2DS2 - VASc score of 5.  He tolerates anticoagulation.  No change in therapy.   CHEST PAIN:  .  He had patent grafts as above.  No change in therapy.  Is not  complaining of chest pain.  HTN:     His blood pressure is borderline I would not make med changes.   CHRONIC SYSTOLIC AND DIASTOLIC HF: I had a long discussion with his son about as needed  dosing of diuretics.  His last creatinine that I can see from the outside hospital was 1.6 Only used too much Lasix and we have recommendations about when to use an extra 40.  He will be on 40 daily.   AS/TAVR:  He is status post TAVR.   He had follow-up in August and I would not suspect dysfunction.  We are going to discontinue the aspirin.  Because of this potential neurologic event I would continue the aspirin along with the Eliquis.   TIA: This was a vague description.  As above we will continue the aspirin.  Current medicines are reviewed at length with the patient today.  The patient does not have concerns regarding medicines.  The following changes have been made:   None  Labs/ tests ordered today include:    Orders Placed This Encounter  Procedures   EKG 12-Lead      Disposition:   FU with APP in 3 months.   Signed, Minus Breeding, MD  09/17/2021 11:05 AM    [place]

## 2021-09-17 ENCOUNTER — Ambulatory Visit: Payer: Medicare HMO | Admitting: Cardiology

## 2021-09-17 ENCOUNTER — Other Ambulatory Visit: Payer: Self-pay

## 2021-09-17 ENCOUNTER — Encounter: Payer: Self-pay | Admitting: Cardiology

## 2021-09-17 VITALS — BP 140/58 | HR 56 | Ht 69.0 in | Wt 176.0 lb

## 2021-09-17 DIAGNOSIS — I5032 Chronic diastolic (congestive) heart failure: Secondary | ICD-10-CM | POA: Diagnosis not present

## 2021-09-17 DIAGNOSIS — I10 Essential (primary) hypertension: Secondary | ICD-10-CM

## 2021-09-17 DIAGNOSIS — I480 Paroxysmal atrial fibrillation: Secondary | ICD-10-CM

## 2021-09-17 NOTE — Patient Instructions (Signed)
Medication Instructions:  Your Physician recommend you continue on your current medication as directed.    *If you need a refill on your cardiac medications before your next appointment, please call your pharmacy*   Follow-Up: At [facility], you and your health needs are our priority.  As part of our continuing mission to provide you with exceptional heart care, we have created designated Provider Care Teams.  These Care Teams include your primary Cardiologist (physician) and Advanced Practice Providers (APPs -  Physician Assistants and Nurse Practitioners) who all work together to provide you with the care you need, when you need it.  We recommend signing up for the patient portal called "MyChart".  Sign up information is provided on this After Visit Summary.  MyChart is used to connect with patients for Virtual Visits (Telemedicine).  Patients are able to view lab/test results, encounter notes, upcoming appointments, etc.  Non-urgent messages can be sent to your provider as well.   To learn more about what you can do with MyChart, go to [email or web address].    Your next appointment:   3 month(s)  The format for your next appointment:   In Person  Provider:   Almyra Deforest, PA-C

## 2021-10-14 ENCOUNTER — Ambulatory Visit (INDEPENDENT_AMBULATORY_CARE_PROVIDER_SITE_OTHER): Payer: Medicare HMO

## 2021-10-14 DIAGNOSIS — I442 Atrioventricular block, complete: Secondary | ICD-10-CM

## 2021-10-14 LAB — CUP PACEART REMOTE DEVICE CHECK
Battery Remaining Longevity: 59 mo
Battery Voltage: 2.96 V
Brady Statistic AP VP Percent: 75.97 %
Brady Statistic AP VS Percent: 1.56 %
Brady Statistic AS VP Percent: 17.37 %
Brady Statistic AS VS Percent: 5.09 %
Brady Statistic RA Percent Paced: 82 %
Brady Statistic RV Percent Paced: 93.35 %
Date Time Interrogation Session: 20230222002026
Implantable Lead Implant Date: 20190211
Implantable Lead Implant Date: 20220908
Implantable Lead Location: 753859
Implantable Lead Location: 753860
Implantable Lead Model: 3830
Implantable Lead Model: 5076
Implantable Pulse Generator Implant Date: 20190211
Lead Channel Impedance Value: 228 Ohm
Lead Channel Impedance Value: 323 Ohm
Lead Channel Impedance Value: 342 Ohm
Lead Channel Impedance Value: 532 Ohm
Lead Channel Pacing Threshold Amplitude: 1.875 V
Lead Channel Pacing Threshold Amplitude: 2.375 V
Lead Channel Pacing Threshold Pulse Width: 0.4 ms
Lead Channel Pacing Threshold Pulse Width: 0.4 ms
Lead Channel Sensing Intrinsic Amplitude: 0.375 mV
Lead Channel Sensing Intrinsic Amplitude: 0.375 mV
Lead Channel Sensing Intrinsic Amplitude: 0.875 mV
Lead Channel Sensing Intrinsic Amplitude: 0.875 mV
Lead Channel Setting Pacing Amplitude: 2 V
Lead Channel Setting Pacing Amplitude: 2.5 V
Lead Channel Setting Pacing Pulse Width: 0.4 ms
Lead Channel Setting Sensing Sensitivity: 0.6 mV

## 2021-10-20 NOTE — Progress Notes (Signed)
Remote pacemaker transmission.   

## 2021-11-02 ENCOUNTER — Other Ambulatory Visit: Payer: Self-pay | Admitting: Physician Assistant

## 2021-11-02 DIAGNOSIS — Z952 Presence of prosthetic heart valve: Secondary | ICD-10-CM

## 2021-11-07 ENCOUNTER — Other Ambulatory Visit: Payer: Self-pay | Admitting: Medical

## 2021-11-27 ENCOUNTER — Other Ambulatory Visit: Payer: Self-pay | Admitting: Cardiology

## 2021-12-15 ENCOUNTER — Other Ambulatory Visit: Payer: Self-pay | Admitting: *Deleted

## 2021-12-15 MED ORDER — TRELEGY ELLIPTA 100-62.5-25 MCG/ACT IN AEPB: 1.0000 | INHALATION_SPRAY | Freq: Every day | RESPIRATORY_TRACT | 0 refills | Status: DC

## 2021-12-16 ENCOUNTER — Ambulatory Visit: Payer: Medicare HMO | Admitting: Physician Assistant

## 2021-12-16 ENCOUNTER — Encounter: Payer: Self-pay | Admitting: Physician Assistant

## 2021-12-16 VITALS — BP 130/58 | HR 50 | Ht 69.0 in | Wt 167.2 lb

## 2021-12-16 DIAGNOSIS — R600 Localized edema: Secondary | ICD-10-CM | POA: Diagnosis not present

## 2021-12-16 DIAGNOSIS — Z952 Presence of prosthetic heart valve: Secondary | ICD-10-CM

## 2021-12-16 DIAGNOSIS — Z950 Presence of cardiac pacemaker: Secondary | ICD-10-CM

## 2021-12-16 DIAGNOSIS — I5033 Acute on chronic diastolic (congestive) heart failure: Secondary | ICD-10-CM | POA: Diagnosis not present

## 2021-12-16 DIAGNOSIS — I10 Essential (primary) hypertension: Secondary | ICD-10-CM

## 2021-12-16 DIAGNOSIS — E785 Hyperlipidemia, unspecified: Secondary | ICD-10-CM

## 2021-12-16 DIAGNOSIS — I25810 Atherosclerosis of coronary artery bypass graft(s) without angina pectoris: Secondary | ICD-10-CM | POA: Diagnosis not present

## 2021-12-16 DIAGNOSIS — Z8673 Personal history of transient ischemic attack (TIA), and cerebral infarction without residual deficits: Secondary | ICD-10-CM

## 2021-12-16 DIAGNOSIS — I480 Paroxysmal atrial fibrillation: Secondary | ICD-10-CM

## 2021-12-16 DIAGNOSIS — R0600 Dyspnea, unspecified: Secondary | ICD-10-CM

## 2021-12-16 NOTE — Progress Notes (Signed)
?Cardiology Office Note:   ? ?Date:  12/18/2021  ? ?ID:  Blake Burgess, DOB 1931/09/09, MRN [id number] ? ?PCP:  Blake Bott, MD ?  ?[place] [facility] Providers ?Cardiologist:  Minus Breeding, MD ?Structural Heart:  Lauree Chandler, MD   ? ?Referring MD: Blake Bott, MD  ? ?Chief Complaint  ?Patient presents with  ? Follow-up  ?  Seen for Dr. Percival Spanish  ? ? ?History of Present Illness:   ? ?Blake Burgess is a 88 y.o. male with a hx of CAD s/p CABG, COPD, PAF, CHB s/p PPM, h/o TAVR, dementia, hypertension, hyperlipidemia, and history of CVA.  He had a abnormal stress test in 2012, subsequent cardiac catheterization demonstrated patent bypass grafts.  Myoview in 2017 was unremarkable.  In February 2019, he had chest pain and shortness of breath, EKG in the emergency room showed complete heart block.  He subsequently had a pacemaker placed.  He was evaluated again in April 2022 at [facility] ED with chest pain.  Echocardiogram suggested his EF was 45 to 50%, no regional wall motion abnormality, severe thickening of the aortic valve.  He underwent successful TAVR with a 26 mm Edwards SAPIEN 3 ultra THV on 03/03/2021.  He had revision of his pacemaker due to atrial lead malfunction in September 2022.  He is followed by pulmonology service for his COPD.  He was seen at [facility] ED in January 2023 due to visual disturbance.  CT showed no acute abnormality.  He had a small left thalamic lacunar infarct and a remote left frontal infarct. ? ?Patient presents today for follow-up.  He has significant lower extremity edema.  He appears to be volume overloaded.  I recommended venous Doppler.  I did ask him to increase his Lasix to 40 mg twice daily for 1 week before going back to 40 mg daily thereafter.  I will obtain basic metabolic panel today and also in 1 week. ? ? ?Past Medical History:  ?Diagnosis Date  ? Aortic stenosis   ? mild AS 09/2017 echo  ? Cancer [facility])   ? skin  ? COPD (chronic obstructive pulmonary disease) ([place])    ? Stage 3 per patient's son  ? Coronary artery disease   ? a.  s/p CABG;   b. cath 4/12: EF 55%, 3vCAD, patent L-LAD, patent S-RCA, patent S-CFX (done after a false pos. ETT)  ? Dementia (Blake Burgess)   ? Per son  ? Diverticular disease   ? GERD (gastroesophageal reflux disease)   ? GI bleed   ? Hemorrhoids   ? HH (hiatus hernia)   ? History of kidney stones   ? Hypertension   ? Osteoarthritis   ? Other and unspecified hyperlipidemia   ? Presence of permanent cardiac pacemaker   ? Schatzki's ring   ? Stroke [facility])   ? ? ?Past Surgical History:  ?Procedure Laterality Date  ? ARTERIOVENOUS GRAFT PLACEMENT W/ ENDOSCOPIC VEIN HARVEST    ? of the right leg greater spahenous vein. Surgeon: Tharon Aquas Trigt,M.D.  ? BACK SURGERY  2017  ? COLONOSCOPY  02/24/2010  ? Hemorrhoids, Diverticulosis. Performed at [place]. Normal terminal ileum. Dr. June Leap, [facility]  ? CORONARY ARTERY BYPASS GRAFT  06/21/2007  ? CABG x 3 Surgeon Ivin Poot, MD  ? EYE SURGERY    ? bilateral cataract removal  ? hip replace  06/09/2004  ? left hip Surgeon Pietro Cassis. Alvan Dame, MD  ? INTRAOPERATIVE TRANSTHORACIC ECHOCARDIOGRAM Left 03/03/2021  ? Procedure: INTRAOPERATIVE TRANSTHORACIC ECHOCARDIOGRAM;  Surgeon:  Burnell Blanks, MD;  Location: [place];  Service: Open Heart Surgery;  Laterality: Left;  ? LAPAROSCOPIC CHOLECYSTECTOMY  2021  ? LEFT HEART CATH AND CORS/GRAFTS ANGIOGRAPHY N/A 11/04/2020  ? Procedure: LEFT HEART CATH AND CORS/GRAFTS ANGIOGRAPHY;  Surgeon: Troy Sine, MD;  Location: [place] CV LAB;  Service: Cardiovascular;  Laterality: N/A;  ? MULTIPLE EXTRACTIONS WITH ALVEOLOPLASTY N/A 02/03/2021  ? Procedure: MULTIPLE EXTRACTION WITH ALVEOLOPLASTY;  Surgeon: Charlaine Dalton, DMD;  Location: [place];  Service: Dentistry;  Laterality: N/A;  ? PACEMAKER IMPLANT N/A 10/03/2017  ? Procedure: PACEMAKER IMPLANT;  Surgeon: Evans Lance, MD;  Location: [place] CV LAB;  Service: Cardiovascular;  Laterality: N/A;  ? PACEMAKER LEAD REMOVAL  N/A 04/30/2021  ? Procedure: PACEMAKER LEAD REMOVAL AND REPLACMENT;  Surgeon: Evans Lance, MD;  Location: [place];  Service: Cardiovascular;  Laterality: N/A;  ? REVERSE SHOULDER ARTHROPLASTY Right 08/25/2018  ? Procedure: REVERSE SHOULDER ARTHROPLASTY;  Surgeon: Netta Cedars, MD;  Location: [place];  Service: Orthopedics;  Laterality: Right;  ? TRANSCATHETER AORTIC VALVE REPLACEMENT, TRANSFEMORAL Bilateral 03/03/2021  ? Procedure: TRANSCATHETER AORTIC VALVE REPLACEMENT, TRANSFEMORAL;  Surgeon: Burnell Blanks, MD;  Location: [place];  Service: Open Heart Surgery;  Laterality: Bilateral;  ? ULTRASOUND GUIDANCE FOR VASCULAR ACCESS Bilateral 03/03/2021  ? Procedure: ULTRASOUND GUIDANCE FOR VASCULAR ACCESS;  Surgeon: Burnell Blanks, MD;  Location: [place];  Service: Open Heart Surgery;  Laterality: Bilateral;  ? ? ?Current Medications: ?Current Meds  ?Medication Sig  ? acetaminophen (TYLENOL) 500 MG tablet Take 1,000 mg by mouth every 8 (eight) hours as needed for moderate pain.  ? albuterol (PROVENTIL) (2.5 MG/3ML) 0.083% nebulizer solution Take 3 mLs (2.5 mg total) by nebulization every 6 (six) hours as needed for wheezing or shortness of breath.  ? amLODipine (NORVASC) 2.5 MG tablet Take 2.5 mg by mouth in the morning.  ? amoxicillin (AMOXIL) 500 MG capsule   ? apixaban (ELIQUIS) 5 MG TABS tablet TAKE 1 TABLET BY MOUTH TWICE A DAY  ? aspirin 81 MG chewable tablet Chew 1 tablet (81 mg total) by mouth daily.  ? atorvastatin (LIPITOR) 40 MG tablet Take 40 mg by mouth in the morning.  ? cyanocobalamin (,VITAMIN B-12,) 1000 MCG/ML injection Inject 1,000 mcg into the muscle every 30 (thirty) days.  ? donepezil (ARICEPT) 10 MG tablet Take 10 mg by mouth at bedtime.  ? enalapril (VASOTEC) 20 MG tablet Take 20 mg by mouth 2 (two) times daily.  ? Fluticasone-Umeclidin-Vilant (TRELEGY ELLIPTA) 100-62.5-25 MCG/ACT AEPB Inhale 1 puff into the lungs daily.  ? furosemide (LASIX) 40 MG tablet Take 40 mg by mouth daily.  Can take extra 40 mg daily as needed for swelling  ? isosorbide mononitrate (IMDUR) 60 MG 24 hr tablet TAKE 1 TABLET BY MOUTH EVERY DAY  ? Lidocaine 4 % PTCH Apply 1 patch topically daily as needed (pain).  ? metoprolol succinate (TOPROL-XL) 50 MG 24 hr tablet TAKE 1 TABLET BY MOUTH EVERY DAY  ? nitroGLYCERIN (NITROSTAT) 0.4 MG SL tablet Place 1 tablet (0.4 mg total) under the tongue every 5 (five) minutes as needed for chest pain.  ? omeprazole (PRILOSEC) 20 MG capsule Take 20 mg by mouth in the morning.  ? tamsulosin (FLOMAX) 0.4 MG CAPS capsule Take 0.4 mg by mouth in the morning.  ?  ? ?Allergies:   Patient has no known allergies.  ? ?Social History  ? ?Socioeconomic History  ? Marital status: Married  ?  Spouse  name: Not on file  ? Number of children: 2  ? Years of education: Not on file  ? Highest education level: Not on file  ?Occupational History  ? Occupation: [employer]  ?  Employer: RETIRED  ?Tobacco Use  ? Smoking status: Never  ? Smokeless tobacco: Never  ?Vaping Use  ? Vaping Use: Never used  ?Substance and Sexual Activity  ? Alcohol use: No  ? Drug use: No  ? Sexual activity: Not on file  ?Other Topics Concern  ? Not on file  ?Social History Narrative  ? No Regular exercise. Daily Caffeine: 24 oz pepsi and 1 cup coffee.   ? ?Social Determinants of Health  ? ?Financial Resource Strain: Not on file  ?Food Insecurity: Not on file  ?Transportation Needs: Not on file  ?Physical Activity: Not on file  ?Stress: Not on file  ?Social Connections: Not on file  ?  ? ?Family History: ?The patient's family history includes Diabetes in his brother, father, and sister; Heart attack in his mother; Hypertension in his mother. ? ?ROS:   ?Please see the history of present illness.    ? All other systems reviewed and are negative. ? ?EKGs/Labs/Other Studies Reviewed:   ? ?The following studies were reviewed today: ? ?Echo 04/08/2021 ?1. Left ventricular ejection fraction, by estimation, is 55 to 60%. The  ?left  ventricle has normal function. The left ventricle has no regional  ?wall motion abnormalities. Left ventricular diastolic parameters are  ?consistent with Grade I diastolic  ?dysfunction (impaired relaxation).

## 2021-12-16 NOTE — Patient Instructions (Addendum)
Medication Instructions:  ?INCREASE Lasix to 40 mg 2 times a day for 1 week then resume 40 mg daily ? ?*If you need a refill on your cardiac medications before your next appointment, please call your pharmacy* ? ?Lab Work: ?Your physician recommends that you return for lab work TODAY:  ?BMET  ? ?Your physician recommends that you return for lab work in 1 week:  ?BMET ? ?If you have labs (blood work) drawn today and your tests are completely normal, you will receive your results only by: ?MyChart Message (if you have MyChart) OR ?A paper copy in the mail ?If you have any lab test that is abnormal or we need to change your treatment, we will call you to review the results. ? ?Testing/Procedures: ?Your physician has requested that you have a lower extremity venous duplex. This test is an ultrasound of the veins in the legs. It looks at venous blood flow that carries blood from the heart to the legs or arms. Allow one hour for a Lower Venous exam. Allow thirty minutes for an Upper Venous exam. There are no restrictions or special instructions. This test is performed at the [facility] office [address] [place], [place] [address] ? ?Please schedule for 1-2 weeks  ? ?Follow-Up: ?At [facility], you and your health needs are our priority.  As part of our continuing mission to provide you with exceptional heart care, we have created designated Provider Care Teams.  These Care Teams include your primary Cardiologist (physician) and Advanced Practice Providers (APPs -  Physician Assistants and Nurse Practitioners) who all work together to provide you with the care you need, when you need it. ? ?Your next appointment:   ?2-3 week(s) ? ?The format for your next appointment:   ?In Person ? ?Provider:   ?Almyra Deforest, PA-C      ? ? ?Other Instructions ?Continue to monitor weight at home and bring log to next office appointment  ?When sitting for long periods of time prop legs to help with swelling  ? ?Important  Information About Sugar ? ? ? ? ? ?

## 2021-12-17 LAB — BASIC METABOLIC PANEL
BUN/Creatinine Ratio: 20 (ref 10–24)
BUN: 32 mg/dL — ABNORMAL HIGH (ref 8–27)
CO2: 21 mmol/L (ref 20–29)
Calcium: 9 mg/dL (ref 8.6–10.2)
Chloride: 102 mmol/L (ref 96–106)
Creatinine, Ser: 1.64 mg/dL — ABNORMAL HIGH (ref 0.76–1.27)
Glucose: 110 mg/dL — ABNORMAL HIGH (ref 70–99)
Potassium: 4.9 mmol/L (ref 3.5–5.2)
Sodium: 141 mmol/L (ref 134–144)
eGFR: 40 mL/min/{1.73_m2} — ABNORMAL LOW (ref >59)

## 2021-12-17 NOTE — Progress Notes (Signed)
Renal function and electrolyte unchanged when compare to 8 month ago.

## 2021-12-18 ENCOUNTER — Encounter: Payer: Self-pay | Admitting: Physician Assistant

## 2021-12-23 ENCOUNTER — Ambulatory Visit (HOSPITAL_COMMUNITY)
Admission: RE | Admit: 2021-12-23 | Discharge: 2021-12-23 | Disposition: A | Discharge status: Home / Self Care | Payer: Medicare HMO | Source: Ambulatory Visit | Attending: Cardiovascular Disease | Admitting: Cardiovascular Disease

## 2021-12-23 DIAGNOSIS — R600 Localized edema: Secondary | ICD-10-CM | POA: Diagnosis not present

## 2021-12-23 NOTE — Progress Notes (Signed)
No blood clot noted in either legs. Patient does have baker's cysts behind both knees which is a benign finding.

## 2022-01-08 ENCOUNTER — Encounter: Payer: Self-pay | Admitting: Physician Assistant

## 2022-01-08 ENCOUNTER — Ambulatory Visit: Payer: Medicare HMO | Admitting: Physician Assistant

## 2022-01-08 VITALS — BP 128/48 | HR 57 | Ht 69.0 in | Wt 161.0 lb

## 2022-01-08 DIAGNOSIS — I480 Paroxysmal atrial fibrillation: Secondary | ICD-10-CM | POA: Diagnosis not present

## 2022-01-08 DIAGNOSIS — I25810 Atherosclerosis of coronary artery bypass graft(s) without angina pectoris: Secondary | ICD-10-CM | POA: Diagnosis not present

## 2022-01-08 DIAGNOSIS — N289 Disorder of kidney and ureter, unspecified: Secondary | ICD-10-CM | POA: Diagnosis not present

## 2022-01-08 DIAGNOSIS — E785 Hyperlipidemia, unspecified: Secondary | ICD-10-CM

## 2022-01-08 DIAGNOSIS — N189 Chronic kidney disease, unspecified: Secondary | ICD-10-CM

## 2022-01-08 DIAGNOSIS — J449 Chronic obstructive pulmonary disease, unspecified: Secondary | ICD-10-CM

## 2022-01-08 DIAGNOSIS — Z952 Presence of prosthetic heart valve: Secondary | ICD-10-CM

## 2022-01-08 DIAGNOSIS — Z8673 Personal history of transient ischemic attack (TIA), and cerebral infarction without residual deficits: Secondary | ICD-10-CM

## 2022-01-08 DIAGNOSIS — I10 Essential (primary) hypertension: Secondary | ICD-10-CM

## 2022-01-08 DIAGNOSIS — Z950 Presence of cardiac pacemaker: Secondary | ICD-10-CM

## 2022-01-08 NOTE — Patient Instructions (Signed)
Medication Instructions:  DECREASE Lasix to 40 mg daily. May take an additional tablet for leg swelling   *If you need a refill on your cardiac medications before your next appointment, please call your pharmacy*  Lab Work: Your physician recommends that you return for lab work in 1 month: BMET  If you have labs (blood work) drawn today and your tests are completely normal, you will receive your results only by: [employer] (if you have MyChart) OR A paper copy in the mail If you have any lab test that is abnormal or we need to change your treatment, we will call you to review the results.  Testing/Procedures: NONE ordered at this time of appointment   Follow-Up: At [facility], you and your health needs are our priority.  As part of our continuing mission to provide you with exceptional heart care, we have created designated Provider Care Teams.  These Care Teams include your primary Cardiologist (physician) and Advanced Practice Providers (APPs -  Physician Assistants and Nurse Practitioners) who all work together to provide you with the care you need, when you need it.   Your next appointment:   6 month(s)  The format for your next appointment:   In Person  Provider:   Minus Breeding, MD     Other Instructions   Important Information About Sugar

## 2022-01-08 NOTE — Progress Notes (Signed)
Cardiology Office Note:    Date:  01/10/2022   ID:  MAYNARD DAVID, DOB 12/11/1931, MRN [id number]  PCP:  Raelene Bott, MD   [facility] Providers Cardiologist:  Minus Breeding, MD Structural Heart:  Lauree Chandler, MD    Referring MD: Raelene Bott, MD   Chief Complaint  Patient presents with   Follow-up    2-3 weeks.    History of Present Illness:    Blake Burgess is a 88 y.o. male with a hx of CAD s/p CABG, COPD, PAF, CHB s/p PPM, h/o TAVR, dementia, hypertension, hyperlipidemia, and history of CVA.  He had a abnormal stress test in 2012, subsequent cardiac catheterization demonstrated patent bypass grafts.  Myoview in 2017 was unremarkable.  In February 2019, he had chest pain and shortness of breath, EKG in the emergency room showed complete heart block.  He subsequently had a pacemaker placed.  He was evaluated again in April 2022 at [facility] ED with chest pain.  Echocardiogram suggested his EF was 45 to 50%, no regional wall motion abnormality, severe thickening of the aortic valve.  He underwent successful TAVR with a 26 mm Edwards SAPIEN 3 ultra THV on 03/03/2021.  He had revision of his pacemaker due to atrial lead malfunction in September 2022.  He is followed by pulmonology service for his COPD.  He was seen at [facility] ED in January 2023 due to visual disturbance.  CT showed no acute abnormality, but had a small left thalamic lacunar infarct and a remote left frontal infarct.  I last saw the patient on 12/16/2021 at which time he had a significant lower extremity edema.  I recommended venous Doppler and increase his Lasix to 40 mg twice a day for 1 week before going back to 40 mg daily thereafter.  His weight was 167 pounds at that time.  He was seen by his PCP on 12/28/2021, repeat blood work on that day showed creatinine 1.80.  Patient presents today accompanied by his son.  He is weight is now 161 pounds which is 6 pounds lower than the last visit.  Lower extremity edema  has completely resolved.  According to his son, they forgot to decrease his Lasix back down to 40 mg daily after 1 week, therefore he is still on 40 mg twice daily at this time.  Given physical exam and his recent blood work at PCPs office, I am concerned that he might be dehydrated, I will reduce his Lasix down to 40 mg daily.  He is to have a repeat basic metabolic panel in 1 month.  He has upcoming device interrogation and repeat echocardiogram.  He follows up with structural heart clinic in June.  He can follow-up with Dr. Percival Spanish in 6 months.  Past Medical History:  Diagnosis Date   Aortic stenosis    mild AS 09/2017 echo   Cancer [facility])    skin   COPD (chronic obstructive pulmonary disease) (HCC)    Stage 3 per patient's son   Coronary artery disease    a.  s/p CABG;   b. cath 4/12: EF 55%, 3vCAD, patent L-LAD, patent S-RCA, patent S-CFX (done after a false pos. ETT)   Dementia [facility])    Per son   Diverticular disease    GERD (gastroesophageal reflux disease)    GI bleed    Hemorrhoids    HH (hiatus hernia)    History of kidney stones    Hypertension    Osteoarthritis    Other  and unspecified hyperlipidemia    Presence of permanent cardiac pacemaker    Schatzki's ring    Stroke [facility])     Past Surgical History:  Procedure Laterality Date   ARTERIOVENOUS GRAFT PLACEMENT W/ ENDOSCOPIC VEIN HARVEST     of the right leg greater spahenous vein. Surgeon: Tharon Aquas Trigt,M.D.   BACK SURGERY  2017   COLONOSCOPY  02/24/2010   Hemorrhoids, Diverticulosis. Performed at [place]. Normal terminal ileum. Dr. June Leap, [place] ARTERY BYPASS GRAFT  06/21/2007   CABG x 3 Surgeon Ivin Poot, MD   EYE SURGERY     bilateral cataract removal   hip replace  06/09/2004   left hip Surgeon Pietro Cassis. Alvan Dame, MD   INTRAOPERATIVE TRANSTHORACIC ECHOCARDIOGRAM Left 03/03/2021   Procedure: INTRAOPERATIVE TRANSTHORACIC ECHOCARDIOGRAM;  Surgeon: Burnell Blanks, MD;  Location: [place];  Service: Open Heart Surgery;  Laterality: Left;   LAPAROSCOPIC CHOLECYSTECTOMY  2021   LEFT HEART CATH AND CORS/GRAFTS ANGIOGRAPHY N/A 11/04/2020   Procedure: LEFT HEART CATH AND CORS/GRAFTS ANGIOGRAPHY;  Surgeon: Troy Sine, MD;  Location: [place] CV LAB;  Service: Cardiovascular;  Laterality: N/A;   MULTIPLE EXTRACTIONS WITH ALVEOLOPLASTY N/A 02/03/2021   Procedure: MULTIPLE EXTRACTION WITH ALVEOLOPLASTY;  Surgeon: Charlaine Dalton, DMD;  Location: [place];  Service: Dentistry;  Laterality: N/A;   PACEMAKER IMPLANT N/A 10/03/2017   Procedure: PACEMAKER IMPLANT;  Surgeon: Evans Lance, MD;  Location: [place] CV LAB;  Service: Cardiovascular;  Laterality: N/A;   PACEMAKER LEAD REMOVAL N/A 04/30/2021   Procedure: PACEMAKER LEAD REMOVAL AND REPLACMENT;  Surgeon: Evans Lance, MD;  Location: [place];  Service: Cardiovascular;  Laterality: N/A;   REVERSE SHOULDER ARTHROPLASTY Right 08/25/2018   Procedure: REVERSE SHOULDER ARTHROPLASTY;  Surgeon: Netta Cedars, MD;  Location: [place];  Service: Orthopedics;  Laterality: Right;   TRANSCATHETER AORTIC VALVE REPLACEMENT, TRANSFEMORAL Bilateral 03/03/2021   Procedure: TRANSCATHETER AORTIC VALVE REPLACEMENT, TRANSFEMORAL;  Surgeon: Burnell Blanks, MD;  Location: [place];  Service: Open Heart Surgery;  Laterality: Bilateral;   ULTRASOUND GUIDANCE FOR VASCULAR ACCESS Bilateral 03/03/2021   Procedure: ULTRASOUND GUIDANCE FOR VASCULAR ACCESS;  Surgeon: Burnell Blanks, MD;  Location: [place];  Service: Open Heart Surgery;  Laterality: Bilateral;    Current Medications: Current Meds  Medication Sig   acetaminophen (TYLENOL) 500 MG tablet Take 1,000 mg by mouth every 8 (eight) hours as needed for moderate pain.   albuterol (PROVENTIL) (2.5 MG/3ML) 0.083% nebulizer solution Take 3 mLs (2.5 mg total) by nebulization every 6 (six) hours as needed for wheezing or shortness of breath.   amLODipine (NORVASC) 2.5 MG tablet Take 2.5 mg by  mouth in the morning.   amoxicillin (AMOXIL) 500 MG capsule    apixaban (ELIQUIS) 5 MG TABS tablet TAKE 1 TABLET BY MOUTH TWICE A DAY   aspirin 81 MG chewable tablet Chew 1 tablet (81 mg total) by mouth daily.   atorvastatin (LIPITOR) 40 MG tablet Take 40 mg by mouth in the morning.   cyanocobalamin (,VITAMIN B-12,) 1000 MCG/ML injection Inject 1,000 mcg into the muscle every 30 (thirty) days.   donepezil (ARICEPT) 10 MG tablet Take 10 mg by mouth at bedtime.   enalapril (VASOTEC) 20 MG tablet Take 20 mg by mouth 2 (two) times daily.   Fluticasone-Umeclidin-Vilant (TRELEGY ELLIPTA) 100-62.5-25 MCG/ACT AEPB Inhale 1 puff into the lungs daily.   furosemide (LASIX) 40 MG tablet Take 40 mg by mouth daily. Can take extra 40  mg daily as needed for swelling   isosorbide mononitrate (IMDUR) 60 MG 24 hr tablet TAKE 1 TABLET BY MOUTH EVERY DAY   Lidocaine 4 % PTCH Apply 1 patch topically daily as needed (pain).   metoprolol succinate (TOPROL-XL) 50 MG 24 hr tablet TAKE 1 TABLET BY MOUTH EVERY DAY   nitroGLYCERIN (NITROSTAT) 0.4 MG SL tablet Place 1 tablet (0.4 mg total) under the tongue every 5 (five) minutes as needed for chest pain.   omeprazole (PRILOSEC) 20 MG capsule Take 20 mg by mouth in the morning.   tamsulosin (FLOMAX) 0.4 MG CAPS capsule Take 0.4 mg by mouth in the morning.     Allergies:   Patient has no known allergies.   Social History   Socioeconomic History   Marital status: Married    Spouse name: Not on file   Number of children: 2   Years of education: Not on file   Highest education level: Not on file  Occupational History   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 Daily Caffeine:  24 oz pepsi and 1 cup coffee.    Social Determinants of Health   Financial Resource Strain: Not on file  Food Insecurity: Not on file  Transportation Needs: Not on file  Physical Activity: Not on file  Stress: Not on file  Social Connections: Not on file     Family History: The patient's family history includes Diabetes in his brother, father, and sister; Heart attack in his mother; Hypertension in his mother.  ROS:   Please see the history of present illness.     All other systems reviewed and are negative.  EKGs/Labs/Other Studies Reviewed:    The following studies were reviewed today:  Echo 04/08/2021 1. Left ventricular ejection fraction, by estimation, is 55 to 60%. The  left ventricle has normal function. The left ventricle has no regional  wall motion abnormalities. Left ventricular diastolic parameters are  consistent with Grade I diastolic  dysfunction (impaired relaxation).   2. Right ventricular systolic function is normal. The right ventricular  size is normal. There is normal pulmonary artery systolic pressure. The  estimated right ventricular systolic pressure is 94.8 mmHg.   3. Left atrial size was moderately dilated.   4. Right atrial size was mildly dilated.   5. The mitral valve is degenerative. Mild mitral valve regurgitation. No  evidence of mitral stenosis. Moderate mitral annular calcification.   6. Tricuspid valve regurgitation is mild to moderate.   7. The aortic valve has been repaired/replaced. Aortic valve  regurgitation is not visualized. There is a 26 mm Sapien prosthetic (TAVR)  valve present in the aortic position. Procedure Date: 03/03/2021. Aortic  valve mean gradient measures 10.0 mmHg.  Aortic valve Vmax measures 2.20 m/s. Aortic valve acceleration time  measures 90 msec.   EKG:  EKG is not ordered today.    Recent Labs: 02/27/2021: ALT 15 03/04/2021: Magnesium 2.0 03/26/2021: B Natriuretic Peptide 409.8 04/08/2021: Hemoglobin 10.2;  Platelets 243 12/16/2021: BUN 32; Creatinine, Ser 1.64; Potassium 4.9; Sodium 141  Recent Lipid Panel    Component Value Date/Time   CHOL 112 10/01/2017 0619  TRIG 68 10/01/2017 0619   HDL 39 (L) 10/01/2017 0619   CHOLHDL 2.9 10/01/2017 0619   VLDL 14 10/01/2017 0619   LDLCALC 59 10/01/2017 0619     Risk Assessment/Calculations:    CHA2DS2-VASc Score = 7   This indicates a 11.2% annual risk of stroke. The patient's score is based upon: CHF History: 1 HTN History: 1 Diabetes History: 0 Stroke History: 2 Vascular Disease History: 1 Age Score: 2 Gender Score: 0          Physical Exam:    VS:  BP (!) 128/48 (BP Location: Left Arm, Patient Position: Sitting, Cuff Size: Normal)   Pulse (!) 57   Ht '5\' 9"'$  (1.753 m)   Wt 161 lb (73 kg)   BMI 23.78 kg/m     Wt Readings from Last 3 Encounters:  01/08/22 161 lb (73 kg)  12/16/21 167 lb 3.2 oz (75.8 kg)  09/17/21 176 lb (79.8 kg)     GEN:  Well nourished, well developed in no acute distress HEENT: Normal NECK: No JVD; No carotid bruits LYMPHATICS: No lymphadenopathy CARDIAC: RRR, no murmurs, rubs, gallops RESPIRATORY:  Clear to auscultation without rales, wheezing or rhonchi  ABDOMEN: Soft, non-tender, non-distended MUSCULOSKELETAL:  No edema; No deformity  SKIN: Warm and dry NEUROLOGIC:  Alert and oriented x 3 PSYCHIATRIC:  Normal affect   ASSESSMENT:    1. Coronary artery disease involving coronary bypass graft of native heart without angina pectoris   2. Acute on chronic renal insufficiency   3. Chronic obstructive pulmonary disease, unspecified COPD type ([place])   4. PAF (paroxysmal atrial fibrillation) ([place])   5. Pacemaker   6. S/P TAVR (transcatheter aortic valve replacement)   7. Essential hypertension   8. Hyperlipidemia LDL goal <70   9. H/O: CVA (cerebrovascular accident)    PLAN:    In order of problems listed above:  CAD s/p CABG: Denies any recent chest pain  Acute on chronic renal  insufficiency: I previously placed him on 40 mg Lasix twice a day for 1 week before decreasing the Lasix down to 40 mg daily thereafter.  However apparently they have forgotten to reduce the dose of Lasix after 1 week, therefore he has been on twice a day dosing of Lasix since April.  Last blood work showed his creatinine was up to 1.8, I recommended reduce the Lasix down to 40 mg daily starting today.  He will need a repeat basic metabolic panel in 1 month  COPD: No recent exacerbation  PAF: On Eliquis and metoprolol  History of pacemaker: Followed by EP service  History of TAVR: Upcoming visit with structural heart team  Hypertension: Blood pressure stable  Hyperlipidemia: On Lipitor  History of CVA: No recent recurrence.            Medication Adjustments/Labs and Tests Ordered: Current medicines are reviewed at length with the patient today.  Concerns regarding medicines are outlined above.  Orders Placed This Encounter  Procedures   Basic metabolic panel   No orders of the defined types were placed in this encounter.   Patient Instructions  Medication Instructions:  DECREASE Lasix to 40 mg daily. May take an additional tablet for leg swelling   *If you need a refill on your cardiac medications before your next appointment, please call your pharmacy*  Lab Work: Your physician recommends that you return for lab work in 1 month: BMET  If you have labs (blood work) drawn today and your tests are completely  normal, you will receive your results only by: MyChart Message (if you have MyChart) OR A paper copy in the mail If you have any lab test that is abnormal or we need to change your treatment, we will call you to review the results.  Testing/Procedures: NONE ordered at this time of appointment   Follow-Up: At [facility], you and your health needs are our priority.  As part of our continuing mission to provide you with exceptional heart care, we have created  designated Provider Care Teams.  These Care Teams include your primary Cardiologist (physician) and Advanced Practice Providers (APPs -  Physician Assistants and Nurse Practitioners) who all work together to provide you with the care you need, when you need it.   Your next appointment:   6 month(s)  The format for your next appointment:   In Person  Provider:   Minus Breeding, MD     Other Instructions   Important Information About Sugar         Hilbert Corrigan, [place]  01/10/2022 11:38 PM    [place]

## 2022-01-13 ENCOUNTER — Ambulatory Visit (INDEPENDENT_AMBULATORY_CARE_PROVIDER_SITE_OTHER): Payer: Medicare HMO

## 2022-01-13 DIAGNOSIS — I442 Atrioventricular block, complete: Secondary | ICD-10-CM

## 2022-01-13 LAB — CUP PACEART REMOTE DEVICE CHECK
Battery Remaining Longevity: 51 mo
Battery Voltage: 2.95 V
Brady Statistic AP VP Percent: 64.36 %
Brady Statistic AP VS Percent: 0.58 %
Brady Statistic AS VP Percent: 31.73 %
Brady Statistic AS VS Percent: 3.33 %
Brady Statistic RA Percent Paced: 67.63 %
Brady Statistic RV Percent Paced: 96.09 %
Date Time Interrogation Session: 20230524012240
Implantable Lead Implant Date: 20190211
Implantable Lead Implant Date: 20220908
Implantable Lead Location: 753859
Implantable Lead Location: 753860
Implantable Lead Model: 3830
Implantable Lead Model: 5076
Implantable Pulse Generator Implant Date: 20190211
Lead Channel Impedance Value: 247 Ohm
Lead Channel Impedance Value: 342 Ohm
Lead Channel Impedance Value: 399 Ohm
Lead Channel Impedance Value: 475 Ohm
Lead Channel Pacing Threshold Amplitude: 1.875 V
Lead Channel Pacing Threshold Amplitude: 2.25 V
Lead Channel Pacing Threshold Pulse Width: 0.4 ms
Lead Channel Pacing Threshold Pulse Width: 0.4 ms
Lead Channel Sensing Intrinsic Amplitude: 0.375 mV
Lead Channel Sensing Intrinsic Amplitude: 0.375 mV
Lead Channel Sensing Intrinsic Amplitude: 1.5 mV
Lead Channel Sensing Intrinsic Amplitude: 1.5 mV
Lead Channel Setting Pacing Amplitude: 2 V
Lead Channel Setting Pacing Amplitude: 2.5 V
Lead Channel Setting Pacing Pulse Width: 0.4 ms
Lead Channel Setting Sensing Sensitivity: 0.6 mV

## 2022-01-26 NOTE — Progress Notes (Signed)
Remote pacemaker transmission.   

## 2022-01-28 NOTE — Progress Notes (Signed)
[facility] [place]                                     Cardiology Office Note:    Date:  01/29/2022   ID:  Blake Burgess, DOB [birth year], MRN [id number]  PCP:  Raelene Bott, MD  [facility] Cardiologist:  Minus Breeding, MD / Dr. Angelena Form & Dr. Roxy Manns (TAVR) [facility] Electrophysiologist:  None   Referring MD: Raelene Bott, MD   1 year s/p TAVR  History of Present Illness:    Blake Burgess is a 88 y.o. male with a hx of multivessel CAD s/p CABG in the remote past, HTN, severe COPD (due to chicken farming), PAF on Eliquis, CHB s/ PPM in 2019, ICM with chronic systolic CHF, CKD stage IIIb, previous stroke, GERD, hiatal hernia and severe aortic stenosis s/p TAVR (03/03/21) who presents to clinic for follow up.   Patient's cardiac history dates back to 2008 when he underwent coronary artery bypass grafting by Dr. Darcey Nora.  He has been followed for many years by Dr. Percival Spanish.  Patient has chronic exertional shortness of breath that has progressed.  Previous echocardiograms have documented the presence of moderately decreased left ventricular systolic function with moderate aortic stenosis.  The patient underwent diagnostic cardiac catheterization in March 2022 which revealed severe native coronary artery disease but continued patency of all bypass grafts involving all 3 vascular territories.  Follow-up transthoracic echocardiogram performed December 10, 2020 revealed some progression in the severity of the patient's aortic stenosis.  Left ventricular ejection fraction was estimated 45 to 50%.  The aortic valve was trileaflet with severe thickening and restricted leaflet mobility involving all 3 leaflets.  Peak velocity across aortic valve measured 3.2 m/s corresponding to mean transvalvular gradient estimated 21.9 mmHg and aortic valve area calculated 1.09 cm by VTI.  The DVI was notably 0.24 and stroke-volume index 47. He was evaluated by Dr. 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  The patient has never been a smoker.  He was evaluated by the multidisciplinary valve team and underwent a successful TAVR with a 26 mm Edwards Sapien 3 Ultra THV via the TF approach on 03/03/21. Post operative echo showed EF 50%, normally functioning TAVR with a mean gradient of 10 mmHg and no PVL. He was discharged on Eliquis and the addition of a baby aspirin x 6 months.1 month echo showed EF 55%, normally functioning TAVR with a mean gradient of 10 mm hg and no PVL.  He continued to have significant DOE. He had revision of his pacemaker due to atrial lead malfunction in September 2022.    He was seen at [facility] ED in January 2023 due to visual disturbance.  CT showed no acute abnormality, but had a small left thalamic lacunar infarct and a remote left frontal infarct.  He was recently seen in our office for worsening LE edema. Lasix increased to '40mg'$  BID x 1 week followed by Lasix '40mg'$  thereafter. His weight was 167 pounds at that time.  He was seen by his PCP on 12/28/2021, repeat blood work on that day showed creatinine 1.80. He had complete resolution of swelling.   Had LE arterial duplex this past week at [facility] and found to have PAD. He has had some non healing ulcers.  Today the patient presents to clinic for follow up.  He is here with his son today.  He is very hard of hearing.  He is doing okay but has worsening shortness of breath.  He has also some worsening lower extremity edema.  He has chronic lower extremity pain that is worse with exertion.  He has some nonhealing wounds on his  lateral shins.  He denies chest pain.  He denies dizziness or syncope.  Past Medical History:  Diagnosis Date   Aortic stenosis    mild AS 09/2017 echo   Cancer [facility])    skin   COPD (chronic obstructive pulmonary disease) (HCC)    Stage 3 per patient's son   Coronary artery  disease    a.  s/p CABG;   b. cath 4/12: EF 55%, 3vCAD, patent L-LAD, patent S-RCA, patent S-CFX (done after a false pos. ETT)   Dementia [facility])    Per son   Diverticular disease    GERD (gastroesophageal reflux disease)    GI bleed    Hemorrhoids    HH (hiatus hernia)    History of kidney stones    Hypertension    Osteoarthritis    Other and unspecified hyperlipidemia    Presence of permanent cardiac pacemaker    Schatzki's ring    Stroke [facility])     Past Surgical History:  Procedure Laterality Date   ARTERIOVENOUS GRAFT PLACEMENT W/ ENDOSCOPIC VEIN HARVEST     of the right leg greater spahenous vein. Surgeon: Tharon Aquas Trigt,M.D.   BACK SURGERY  2017   COLONOSCOPY  02/24/2010   Hemorrhoids, Diverticulosis. Performed at [place]. Normal terminal ileum. Dr. June Leap, [place] ARTERY BYPASS GRAFT  06/21/2007   CABG x 3 Surgeon Ivin Poot, MD   EYE SURGERY     bilateral cataract removal   hip replace  06/09/2004   left hip Surgeon Pietro Cassis. Alvan Dame, MD   INTRAOPERATIVE TRANSTHORACIC ECHOCARDIOGRAM Left 03/03/2021   Procedure: INTRAOPERATIVE TRANSTHORACIC ECHOCARDIOGRAM;  Surgeon: Burnell Blanks, MD;  Location: [place];  Service: Open Heart Surgery;  Laterality: Left;   LAPAROSCOPIC CHOLECYSTECTOMY  2021   LEFT HEART CATH AND CORS/GRAFTS ANGIOGRAPHY N/A 11/04/2020   Procedure: LEFT HEART CATH AND CORS/GRAFTS ANGIOGRAPHY;  Surgeon: Troy Sine, MD;  Location: [place] CV LAB;  Service: Cardiovascular;  Laterality: N/A;   MULTIPLE EXTRACTIONS WITH ALVEOLOPLASTY N/A 02/03/2021   Procedure: MULTIPLE EXTRACTION WITH ALVEOLOPLASTY;  Surgeon: Charlaine Dalton, DMD;  Location: [place];  Service: Dentistry;  Laterality: N/A;   PACEMAKER IMPLANT N/A 10/03/2017   Procedure: PACEMAKER IMPLANT;  Surgeon: Evans Lance, MD;  Location: [place] CV LAB;  Service: Cardiovascular;  Laterality: N/A;   PACEMAKER LEAD REMOVAL N/A 04/30/2021   Procedure: PACEMAKER LEAD  REMOVAL AND REPLACMENT;  Surgeon: Evans Lance, MD;  Location: [place];  Service: Cardiovascular;  Laterality: N/A;   REVERSE SHOULDER ARTHROPLASTY Right 08/25/2018   Procedure: REVERSE SHOULDER ARTHROPLASTY;  Surgeon: Netta Cedars, MD;  Location: [place];  Service: Orthopedics;  Laterality: Right;   TRANSCATHETER AORTIC VALVE REPLACEMENT, TRANSFEMORAL Bilateral 03/03/2021   Procedure: TRANSCATHETER AORTIC VALVE REPLACEMENT, TRANSFEMORAL;  Surgeon: Burnell Blanks, MD;  Location: [place];  Service: Open Heart Surgery;  Laterality: Bilateral;   ULTRASOUND GUIDANCE FOR VASCULAR ACCESS Bilateral 03/03/2021   Procedure: ULTRASOUND GUIDANCE FOR VASCULAR ACCESS;  Surgeon: Burnell Blanks, MD;  Location: [place];  Service: Open Heart Surgery;  Laterality: Bilateral;    Current  Medications: Current Meds  Medication Sig   acetaminophen (TYLENOL) 500 MG tablet Take 1,000 mg by mouth every 8 (eight) hours as needed for moderate pain.   albuterol (PROVENTIL) (2.5 MG/3ML) 0.083% nebulizer solution Take 3 mLs (2.5 mg total) by nebulization every 6 (six) hours as needed for wheezing or shortness of breath.   amLODipine (NORVASC) 2.5 MG tablet Take 2.5 mg by mouth in the morning.   amoxicillin (AMOXIL) 500 MG capsule    apixaban (ELIQUIS) 5 MG TABS tablet TAKE 1 TABLET BY MOUTH TWICE A DAY   atorvastatin (LIPITOR) 40 MG tablet Take 40 mg by mouth in the morning.   cyanocobalamin (,VITAMIN B-12,) 1000 MCG/ML injection Inject 1,000 mcg into the muscle every 30 (thirty) days.   donepezil (ARICEPT) 10 MG tablet Take 10 mg by mouth at bedtime.   enalapril (VASOTEC) 20 MG tablet Take 20 mg by mouth 2 (two) times daily.   Fluticasone-Umeclidin-Vilant (TRELEGY ELLIPTA) 100-62.5-25 MCG/ACT AEPB Inhale 1 puff into the lungs daily.   isosorbide mononitrate (IMDUR) 60 MG 24 hr tablet TAKE 1 TABLET BY MOUTH EVERY DAY   Lidocaine 4 % PTCH Apply 1 patch topically daily as needed (pain).   metoprolol succinate  (TOPROL-XL) 50 MG 24 hr tablet TAKE 1 TABLET BY MOUTH EVERY DAY   nitroGLYCERIN (NITROSTAT) 0.4 MG SL tablet Place 1 tablet (0.4 mg total) under the tongue every 5 (five) minutes as needed for chest pain.   omeprazole (PRILOSEC) 20 MG capsule Take 20 mg by mouth in the morning.   tamsulosin (FLOMAX) 0.4 MG CAPS capsule Take 0.4 mg by mouth in the morning.   [DISCONTINUED] aspirin 81 MG chewable tablet Chew 1 tablet (81 mg total) by mouth daily.   [DISCONTINUED] furosemide (LASIX) 40 MG tablet Take 40 mg by mouth daily. Can take extra 40 mg daily as needed for swelling   [DISCONTINUED] furosemide (LASIX) 40 MG tablet TAKE 40 MG INA THE AM AND 20 MG IN THE PM.     Allergies:   Patient has no known allergies.   Social History   Socioeconomic History   Marital status: Married    Spouse name: Not on file   Number of children: 2   Years of education: Not on file   Highest education level: Not on file  Occupational History   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 Daily Caffeine: 24 oz pepsi and 1 cup coffee.    Social Determinants of Health   Financial Resource Strain: Not on file  Food Insecurity: Not on file  Transportation Needs: Not on file  Physical Activity: Not on file  Stress: Not on file  Social Connections: Not on file     Family History: The patient's family history includes Diabetes in his brother, father, and sister; Heart attack in his mother; Hypertension in his mother.  ROS:   Please see the history of present illness.    All other systems reviewed and are negative.  EKGs/Labs/Other Studies Reviewed:    The following studies were reviewed today:  TAVR 03/03/21:      Date of Procedure:  03/03/2021   Preoperative Diagnosis:      Severe Aortic Stenosis   Postoperative Diagnosis:    Same   Procedure:        Transcatheter Aortic Valve Replacement - Transfemoral Approach             Edwards Sapien 3 THV (size 26 mm, model # [id number], serial # [id number])              Co-Surgeons:                        Lauree Chandler, MD and Valentina Gu. Roxy Manns, MD   Anesthesiologist:                  Kalman Shan   Echocardiographer:              Johnsie Cancel   Pre-operative Echo Findings: Severe aortic stenosis Normal left ventricular systolic function   Post-operative Echo Findings: No paravalvular leak Normal left ventricular systolic function   _________________________   Echo 03/04/21 IMPRESSIONS   1. Abnormal septal motion . Left ventricular ejection fraction, by  estimation, is 50 to 55%. The left ventricle has low normal function. The  left ventricle has no regional wall motion abnormalities. There is mild  left ventricular hypertrophy. Left  ventricular diastolic parameters are consistent with Grade I diastolic  dysfunction (impaired relaxation).   2. Pacing wires in RA/RV . Right ventricular systolic function is normal.  The right ventricular size is normal. There is normal pulmonary artery  systolic pressure.   3. Left atrial size was moderately dilated.   4. Right atrial size was moderately dilated.   5. The mitral valve is degenerative. Mild mitral valve regurgitation. No  evidence of mitral stenosis. Moderate mitral annular calcification.   6. Post TAVR 03/03/21 Well placed 26 mm Sapien 3 valve No PVL mean  gradient 10 peak 15 mmHg AVA 2.5 cm2. The aortic valve has been  repaired/replaced. Aortic valve regurgitation is not visualized. No aortic  stenosis is present. Procedure Date: 03/03/2021.   7. The inferior vena cava is normal in size with greater than 50%  respiratory variability, suggesting right atrial pressure of 3 mmHg.    ___________________________  Echo 04/08/21 IMPRESSIONS   1. Left ventricular ejection fraction, by estimation, is 55 to 60%. The  left ventricle has normal function. The left ventricle has no regional  wall motion abnormalities. Left ventricular diastolic parameters are  consistent with Grade I diastolic  dysfunction (impaired relaxation).   2. Right ventricular systolic function is normal. The right ventricular  size is normal. There is normal pulmonary artery systolic pressure. The  estimated right ventricular systolic pressure is 50.5 mmHg.   3. Left atrial size was moderately dilated.   4. Right atrial size was mildly dilated.   5. The mitral valve is degenerative. Mild mitral valve regurgitation. No  evidence of mitral stenosis. Moderate mitral annular calcification.   6. Tricuspid valve regurgitation is mild to moderate.   7. The aortic valve has been repaired/replaced. Aortic valve  regurgitation is not visualized. There is a 26 mm Sapien prosthetic (TAVR)  valve present in the aortic position. Procedure Date: 03/03/2021. Aortic  valve mean gradient measures 10.0 mmHg.  Aortic valve Vmax measures 2.20 m/s. Aortic valve acceleration time  measures 90 msec  ______________________  Arterial doppler 01/26/22 01/26/2022 4:18 PM EDT   --Atherosclerotic disease in the bilateral posterior and anterior tibial arteries.  --Redemonstrated occlusion in the right anterior  tibial artery.  --Left anterior and posterior tibial arteries are not visualized and may be occluded.     ______________________  Echo 01/29/22 IMPRESSIONS  1. Left ventricular ejection fraction, by estimation, is 55 to 60%. The left ventricle has normal function. The left ventricle has no regional wall motion abnormalities. There is mild left ventricular hypertrophy. Left ventricular diastolic parameters  are indeterminate.  2. Right ventricular systolic function is normal. The right ventricular size is normal.  There is normal pulmonary artery systolic pressure. The estimated right ventricular systolic pressure is 25.4 mmHg.  3. Left atrial size was moderately dilated.  4. Right atrial size was mildly dilated.  5. The mitral valve is degenerative. Moderate mitral valve regurgitation with diastolic MR noted. Mild mitral stenosis, gradient 3 mmHg at HR 50 bpm. Moderate mitral annular calcification.  6. The aortic valve has been repaired/replaced. Aortic valve regurgitation is not visualized. There is a 26 mm Sapien prosthetic (TAVR) valve present in the aortic position. Procedure Date: 03/03/21. Echo findings are consistent with stable structure and  function of the aortic valve prosthesis. Aortic valve area, by VTI measures 1.37 cm. Aortic valve mean gradient measures 10.0 mmHg. Aortic valve Vmax measures 2.16 m/s.  7. The inferior vena cava is dilated in size with <50% respiratory variability, suggesting right atrial pressure of 15 mmHg.    EKG:  EKG is NOT ordered today.   Recent Labs: 02/27/2021: ALT 15 03/04/2021: Magnesium 2.0 03/26/2021: B Natriuretic Peptide 409.8 04/08/2021: Hemoglobin 10.2; Platelets 243 12/16/2021: BUN 32; Creatinine, Ser 1.64; Potassium 4.9; Sodium 141  Recent Lipid Panel    Component Value Date/Time   CHOL 112 10/01/2017 0619   TRIG 68 10/01/2017 0619   HDL 39 (L) 10/01/2017 0619   CHOLHDL 2.9 10/01/2017 0619   VLDL 14 10/01/2017 0619   LDLCALC 59 10/01/2017 0619     Risk Assessment/Calculations:    CHA2DS2-VASc Score = 7  This indicates a 11.2% annual risk of stroke. The patient's score is based upon: CHF History: 1 HTN History: 1 Diabetes History: 0 Stroke History: 2 Vascular Disease History: 1 Age Score: 2 Gender Score: 0     Physical Exam:    VS:  BP (!) 142/60   Pulse (!) 45   Ht '5\' 9"'$  (1.753 m)   Wt 164 lb 12.8 oz (74.8 kg)   SpO2 93%   BMI 24.34 kg/m     Wt Readings from Last 3 Encounters:  01/29/22 164 lb 12.8 oz (74.8 kg)  01/08/22 161 lb  (73 kg)  12/16/21 167 lb 3.2 oz (75.8 kg)     GEN:  Well nourished, well developed in no acute distress. HOH HEENT: Normal NECK: + JVD  LYMPHATICS: No lymphadenopathy CARDIAC: RRR, no murmurs, rubs, gallops RESPIRATORY:  Clear to auscultation without rales, wheezing or rhonchi  ABDOMEN: Soft, non-tender, non-distended SKIN: Mild lower extremity edema.  Small patches of scabbed over wounds on lateral shin. NEUROLOGIC:  Alert and oriented x 3 PSYCHIATRIC:  Normal affect   ASSESSMENT:    1. S/P TAVR (transcatheter aortic valve replacement)   2. Cardiac pacemaker in situ   3. Coronary artery disease involving coronary bypass graft of native heart without angina pectoris   4. Essential hypertension   5. PAF (paroxysmal atrial fibrillation) ([place])   6. Chronic combined systolic and diastolic heart failure ([place])   7. Chronic kidney disease, unspecified CKD stage   8. PAD (peripheral artery disease) (HCC)     PLAN:    In  order of problems listed above:  Severe AS s/p TAVR: echo today shows EF 55%, normally functioning TAVR with a mean gradient of 10 mm hg and no PVL as well as moderate MR/mild MS and mild TR. He has NYHA class III symptoms with significant dyspnea. Still on Eliquis and aspirin. He can stop aspirin now. SBE prophylaxis discussed; he has amoxicillin.  Continue regular follow-up with Dr. Percival Spanish  S/p PPM: followed by Dr. Lovena Le. He is seeing him next week. Per last device interrogation he needs to discuss lead revision.   CAD: pre TAVR cath showed continued patency of all bypass grafts.  Continue medical therapy.  No chest pain  HTN: BP borderline today.  I have increased his Lasix.  PAF: continue on Eliquis 5 mg twice daily.  Acute on chronic diastolic CHF: his weight is up 3 to 4 pounds, he has mild lower extremity edema and worsening dyspnea on exertion.  Previously did well with 40 mg of Lasix twice a day, however his kidney function bumped and this was brought back  down to 40 mg daily.  Will increase it now to 40 mg in the morning and 20 mg in the afternoon.  Check a BMP and BNP today.  CKD stage IIIb: check BMET today   PAD: has some pain in legs with non healing wounds and recent dopplers showed PAD.  Will refer to vascular.  Medication Adjustments/Labs and Tests Ordered: Current medicines are reviewed at length with the patient today.  Concerns regarding medicines are outlined above.  Orders Placed This Encounter  Procedures   Basic metabolic panel   Pro b natriuretic peptide (BNP)   Ambulatory referral to Vascular Surgery    Meds ordered this encounter  Medications   DISCONTD: furosemide (LASIX) 40 MG tablet    Sig: TAKE 40 MG INA THE AM AND 20 MG IN THE PM.    Dispense:  135 tablet    Refill:  3   furosemide (LASIX) 40 MG tablet    Sig: TAKE 40 MG INA THE AM AND 20 MG IN THE PM.    Dispense:  135 tablet    Refill:  3      Patient Instructions  Medication Instructions:  Your physician has recommended you make the following change in your medication:  STOP ASPIRIN   2. TAKE LASIX 40 MG IN THE AM AND 20 MG IN PM  *If you need a refill on your cardiac medications before your next appointment, please call your pharmacy*   Lab Work: TODAY: BMET, BNP  If you have labs (blood work) drawn today and your tests are completely normal, you will receive your results only by: [place] (if you have MyChart) OR A paper copy in the mail If you have any lab test that is abnormal or we need to change your treatment, we will call you to review the results.   Testing/Procedures: NONE ORDERED    Follow-Up: At [facility], you and your health needs are our priority.  As part of our continuing mission to provide you with exceptional heart care, we have created designated Provider Care Teams.  These Care Teams include your primary Cardiologist (physician) and Advanced Practice Providers (APPs -  Physician Assistants and Nurse  Practitioners) who all work together to provide you with the care you need, when you need it.  We recommend signing up for the patient portal called "MyChart".  Sign up information is provided on this After Visit Summary.  MyChart is  used to connect with patients for Virtual Visits (Telemedicine).  Patients are able to view lab/test results, encounter notes, upcoming appointments, etc.  Non-urgent messages can be sent to your provider as well.   To learn more about what you can do with MyChart, go to [email or web address].    Your next appointment:   KEEP SCHEDULED FOLLOW-UP  YOU HAVE BEEN REFERRED TO [facility]. THEY WILL CALL YOU TO SCHEDULE AN APPOINTMENT   Important Information About Sugar         Signed, Angelena Form, PA-C  01/29/2022 2:14 PM    [name]

## 2022-01-29 ENCOUNTER — Ambulatory Visit: Payer: Medicare HMO | Admitting: Physician Assistant

## 2022-01-29 ENCOUNTER — Ambulatory Visit (HOSPITAL_COMMUNITY): Payer: Medicare HMO | Attending: Internal Medicine

## 2022-01-29 VITALS — BP 142/60 | HR 45 | Ht 69.0 in | Wt 164.8 lb

## 2022-01-29 DIAGNOSIS — I10 Essential (primary) hypertension: Secondary | ICD-10-CM | POA: Diagnosis not present

## 2022-01-29 DIAGNOSIS — Z952 Presence of prosthetic heart valve: Secondary | ICD-10-CM | POA: Diagnosis present

## 2022-01-29 DIAGNOSIS — Z950 Presence of cardiac pacemaker: Secondary | ICD-10-CM | POA: Diagnosis not present

## 2022-01-29 DIAGNOSIS — I25810 Atherosclerosis of coronary artery bypass graft(s) without angina pectoris: Secondary | ICD-10-CM

## 2022-01-29 DIAGNOSIS — I480 Paroxysmal atrial fibrillation: Secondary | ICD-10-CM

## 2022-01-29 DIAGNOSIS — I5042 Chronic combined systolic (congestive) and diastolic (congestive) heart failure: Secondary | ICD-10-CM

## 2022-01-29 DIAGNOSIS — N189 Chronic kidney disease, unspecified: Secondary | ICD-10-CM

## 2022-01-29 DIAGNOSIS — I739 Peripheral vascular disease, unspecified: Secondary | ICD-10-CM

## 2022-01-29 LAB — ECHOCARDIOGRAM COMPLETE
AR max vel: 1.33 cm2
AV Area VTI: 1.37 cm2
AV Area mean vel: 1.29 cm2
AV Mean grad: 10 mmHg
AV Peak grad: 18.6 mmHg
Ao pk vel: 2.16 m/s
Area-P 1/2: 3.43 cm2
MV M vel: 5.57 m/s
MV Peak grad: 124.1 mmHg
S' Lateral: 3.6 cm

## 2022-01-29 MED ORDER — FUROSEMIDE 40 MG PO TABS: ORAL_TABLET | ORAL | 3 refills | Status: DC

## 2022-01-29 NOTE — Patient Instructions (Addendum)
Medication Instructions:  Your physician has recommended you make the following change in your medication:  STOP ASPIRIN   2. TAKE LASIX 40 MG IN THE AM AND 20 MG IN PM  *If you need a refill on your cardiac medications before your next appointment, please call your pharmacy*   Lab Work: TODAY: BMET, BNP  If you have labs (blood work) drawn today and your tests are completely normal, you will receive your results only by: [place] (if you have MyChart) OR A paper copy in the mail If you have any lab test that is abnormal or we need to change your treatment, we will call you to review the results.   Testing/Procedures: NONE ORDERED    Follow-Up: At [facility], you and your health needs are our priority.  As part of our continuing mission to provide you with exceptional heart care, we have created designated Provider Care Teams.  These Care Teams include your primary Cardiologist (physician) and Advanced Practice Providers (APPs -  Physician Assistants and Nurse Practitioners) who all work together to provide you with the care you need, when you need it.  We recommend signing up for the patient portal called "MyChart".  Sign up information is provided on this After Visit Summary.  MyChart is used to connect with patients for Virtual Visits (Telemedicine).  Patients are able to view lab/test results, encounter notes, upcoming appointments, etc.  Non-urgent messages can be sent to your provider as well.   To learn more about what you can do with MyChart, go to [email or web address].    Your next appointment:   KEEP SCHEDULED FOLLOW-UP  YOU HAVE BEEN REFERRED TO [facility]. THEY WILL CALL YOU TO SCHEDULE AN APPOINTMENT   Important Information About Sugar

## 2022-01-30 LAB — BASIC METABOLIC PANEL
BUN/Creatinine Ratio: 19 (ref 10–24)
BUN: 30 mg/dL — ABNORMAL HIGH (ref 8–27)
CO2: 22 mmol/L (ref 20–29)
Calcium: 9.1 mg/dL (ref 8.6–10.2)
Chloride: 106 mmol/L (ref 96–106)
Creatinine, Ser: 1.59 mg/dL — ABNORMAL HIGH (ref 0.76–1.27)
Glucose: 97 mg/dL (ref 70–99)
Potassium: 4.9 mmol/L (ref 3.5–5.2)
Sodium: 140 mmol/L (ref 134–144)
eGFR: 42 mL/min/{1.73_m2} — ABNORMAL LOW (ref >59)

## 2022-01-30 LAB — PRO B NATRIURETIC PEPTIDE: NT-Pro BNP: 5387 pg/mL — ABNORMAL HIGH (ref 0–486)

## 2022-02-05 ENCOUNTER — Other Ambulatory Visit: Payer: Self-pay | Admitting: *Deleted

## 2022-02-05 DIAGNOSIS — M79606 Pain in leg, unspecified: Secondary | ICD-10-CM

## 2022-02-08 ENCOUNTER — Ambulatory Visit: Payer: Medicare HMO | Admitting: Internal Medicine

## 2022-02-08 ENCOUNTER — Encounter: Payer: Self-pay | Admitting: Internal Medicine

## 2022-02-08 VITALS — BP 106/50 | HR 60 | Ht 69.0 in | Wt 167.0 lb

## 2022-02-08 DIAGNOSIS — I442 Atrioventricular block, complete: Secondary | ICD-10-CM | POA: Diagnosis not present

## 2022-02-08 DIAGNOSIS — I2510 Atherosclerotic heart disease of native coronary artery without angina pectoris: Secondary | ICD-10-CM

## 2022-02-08 DIAGNOSIS — I10 Essential (primary) hypertension: Secondary | ICD-10-CM

## 2022-02-08 DIAGNOSIS — T82198D Other mechanical complication of other cardiac electronic device, subsequent encounter: Secondary | ICD-10-CM | POA: Diagnosis not present

## 2022-02-08 DIAGNOSIS — T82198A Other mechanical complication of other cardiac electronic device, initial encounter: Secondary | ICD-10-CM

## 2022-02-08 LAB — CUP PACEART INCLINIC DEVICE CHECK
Battery Remaining Longevity: 49 mo
Battery Voltage: 2.95 V
Brady Statistic AP VP Percent: 67.64 %
Brady Statistic AP VS Percent: 0.89 %
Brady Statistic AS VP Percent: 27.84 %
Brady Statistic AS VS Percent: 3.62 %
Brady Statistic RA Percent Paced: 71.55 %
Brady Statistic RV Percent Paced: 95.48 %
Date Time Interrogation Session: 20230619103308
Implantable Lead Implant Date: 20190211
Implantable Lead Implant Date: 20220908
Implantable Lead Location: 753859
Implantable Lead Location: 753860
Implantable Lead Model: 3830
Implantable Lead Model: 5076
Implantable Pulse Generator Implant Date: 20190211
Lead Channel Impedance Value: 228 Ohm
Lead Channel Impedance Value: 342 Ohm
Lead Channel Impedance Value: 418 Ohm
Lead Channel Impedance Value: 570 Ohm
Lead Channel Pacing Threshold Amplitude: 2.5 V
Lead Channel Pacing Threshold Pulse Width: 0.4 ms
Lead Channel Sensing Intrinsic Amplitude: 0.5 mV
Lead Channel Sensing Intrinsic Amplitude: 1 mV
Lead Channel Sensing Intrinsic Amplitude: 1.25 mV
Lead Channel Sensing Intrinsic Amplitude: 1.375 mV
Lead Channel Setting Pacing Amplitude: 2 V
Lead Channel Setting Pacing Amplitude: 2.5 V
Lead Channel Setting Pacing Pulse Width: 0.4 ms
Lead Channel Setting Sensing Sensitivity: 0.6 mV

## 2022-02-08 NOTE — Progress Notes (Signed)
HPI [name] returns today for followup. He is a pleasant elderly man with a h/o atrial lead disfunction s/p atrial lead extraction and insertion of a new atrial lead. He has been stable in the interim though he admits to being sedentary. No chest pain or sob. Noedema. No trouble healing.  He has been found to have some dysfunction on both his atrial lead and RV lead.  No Known Allergies   Current Outpatient Medications  Medication Sig Dispense Refill   acetaminophen (TYLENOL) 500 MG tablet Take 1,000 mg by mouth every 8 (eight) hours as needed for moderate pain.     albuterol (PROVENTIL) (2.5 MG/3ML) 0.083% nebulizer solution Take 3 mLs (2.5 mg total) by nebulization every 6 (six) hours as needed for wheezing or shortness of breath. 75 mL 12   amLODipine (NORVASC) 2.5 MG tablet Take 2.5 mg by mouth in the morning.     amoxicillin (AMOXIL) 500 MG capsule      apixaban (ELIQUIS) 5 MG TABS tablet TAKE 1 TABLET BY MOUTH TWICE A DAY 60 tablet 5   atorvastatin (LIPITOR) 40 MG tablet Take 40 mg by mouth in the morning.     cyanocobalamin (,VITAMIN B-12,) 1000 MCG/ML injection Inject 1,000 mcg into the muscle every 30 (thirty) days.     donepezil (ARICEPT) 10 MG tablet Take 10 mg by mouth at bedtime.     enalapril (VASOTEC) 20 MG tablet Take 20 mg by mouth 2 (two) times daily.     Fluticasone-Umeclidin-Vilant (TRELEGY ELLIPTA) 100-62.5-25 MCG/ACT AEPB Inhale 1 puff into the lungs daily. 60 each 0   furosemide (LASIX) 40 MG tablet TAKE 40 MG INA THE AM AND 20 MG IN THE PM. 135 tablet 3   isosorbide mononitrate (IMDUR) 60 MG 24 hr tablet TAKE 1 TABLET BY MOUTH EVERY DAY 90 tablet 3   Lidocaine 4 % PTCH Apply 1 patch topically daily as needed (pain).     metoprolol succinate (TOPROL-XL) 50 MG 24 hr tablet TAKE 1 TABLET BY MOUTH EVERY DAY 90 tablet 3   nitroGLYCERIN (NITROSTAT) 0.4 MG SL tablet Place 1 tablet (0.4 mg total) under the tongue every 5 (five) minutes as needed for chest pain. 25  tablet 3   omeprazole (PRILOSEC) 20 MG capsule Take 20 mg by mouth in the morning.     tamsulosin (FLOMAX) 0.4 MG CAPS capsule Take 0.4 mg by mouth in the morning.     No current facility-administered medications for this visit.     Past Medical History:  Diagnosis Date   Aortic stenosis    mild AS 09/2017 echo   Cancer [facility])    skin   COPD (chronic obstructive pulmonary disease) (HCC)    Stage 3 per patient's son   Coronary artery disease    a.  s/p CABG;   b. cath 4/12: EF 55%, 3vCAD, patent L-LAD, patent S-RCA, patent S-CFX (done after a false pos. ETT)   Dementia ([place])    Per son   Diverticular disease    GERD (gastroesophageal reflux disease)    GI bleed    Hemorrhoids    HH (hiatus hernia)    History of kidney stones    Hypertension    Osteoarthritis    Other and unspecified hyperlipidemia    Presence of permanent cardiac pacemaker    Schatzki's ring    Stroke ([place])     ROS:   All systems reviewed and negative except as noted in the HPI.  Past Surgical History:  Procedure Laterality Date   ARTERIOVENOUS GRAFT PLACEMENT W/ ENDOSCOPIC VEIN HARVEST     of the right leg greater spahenous vein. Surgeon: Tharon Aquas Trigt,M.D.   BACK SURGERY  2017   COLONOSCOPY  02/24/2010   Hemorrhoids, Diverticulosis. Performed at [place]. Normal terminal ileum. Dr. June Leap, [place] ARTERY BYPASS GRAFT  06/21/2007   CABG x 3 Surgeon Ivin Poot, MD   EYE SURGERY     bilateral cataract removal   hip replace  06/09/2004   left hip Surgeon Pietro Cassis. Alvan Dame, MD   INTRAOPERATIVE TRANSTHORACIC ECHOCARDIOGRAM Left 03/03/2021   Procedure: INTRAOPERATIVE TRANSTHORACIC ECHOCARDIOGRAM;  Surgeon: Burnell Blanks, MD;  Location: [place];  Service: Open Heart Surgery;  Laterality: Left;   LAPAROSCOPIC CHOLECYSTECTOMY  2021   LEFT HEART CATH AND CORS/GRAFTS ANGIOGRAPHY N/A 11/04/2020   Procedure: LEFT HEART CATH AND CORS/GRAFTS ANGIOGRAPHY;  Surgeon: Troy Sine, MD;   Location: [place] CV LAB;  Service: Cardiovascular;  Laterality: N/A;   MULTIPLE EXTRACTIONS WITH ALVEOLOPLASTY N/A 02/03/2021   Procedure: MULTIPLE EXTRACTION WITH ALVEOLOPLASTY;  Surgeon: Charlaine Dalton, DMD;  Location: [place];  Service: Dentistry;  Laterality: N/A;   PACEMAKER IMPLANT N/A 10/03/2017   Procedure: PACEMAKER IMPLANT;  Surgeon: Evans Lance, MD;  Location: [place] CV [facility];  Service: Cardiovascular;  Laterality: N/A;   PACEMAKER LEAD REMOVAL N/A 04/30/2021   Procedure: PACEMAKER LEAD REMOVAL AND REPLACMENT;  Surgeon: Evans Lance, MD;  Location: [place];  Service: Cardiovascular;  Laterality: N/A;   REVERSE SHOULDER ARTHROPLASTY Right 08/25/2018   Procedure: REVERSE SHOULDER ARTHROPLASTY;  Surgeon: Netta Cedars, MD;  Location: [place];  Service: Orthopedics;  Laterality: Right;   TRANSCATHETER AORTIC VALVE REPLACEMENT, TRANSFEMORAL Bilateral 03/03/2021   Procedure: TRANSCATHETER AORTIC VALVE REPLACEMENT, TRANSFEMORAL;  Surgeon: Burnell Blanks, MD;  Location: [place];  Service: Open Heart Surgery;  Laterality: Bilateral;   ULTRASOUND GUIDANCE FOR VASCULAR ACCESS Bilateral 03/03/2021   Procedure: ULTRASOUND GUIDANCE FOR VASCULAR ACCESS;  Surgeon: Burnell Blanks, MD;  Location: [place];  Service: Open Heart Surgery;  Laterality: Bilateral;     Family History  Problem Relation Age of Onset   Heart attack Mother    Hypertension Mother    Diabetes Father    Diabetes Brother    Diabetes Sister      Social History   Socioeconomic History   Marital status: Married    Spouse name: Not on file   Number of children: 2   Years of education: Not on file   Highest education level: Not on file  Occupational History   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 Daily Caffeine: 24 oz pepsi and 1 cup coffee.    Social Determinants of Health   Financial Resource Strain: Not on file  Food Insecurity: Not on file  Transportation Needs: Not on file  Physical Activity: Not on file  Stress: Not on file  Social Connections: Not on file  Intimate Partner Violence: Not on file     BP (!) 106/50   Pulse 60   Ht '5\' 9"'$  (1.753 m)  Wt 167 lb (75.8 kg)   SpO2 97%   BMI 24.66 kg/m   Physical Exam:  Elderly appearing NAD HEENT: Unremarkable Neck:  No JVD, no thyromegally Lymphatics:  No adenopathy Back:  No CVA tenderness Lungs:  Clear with no wheezes HEART:  Regular rate rhythm, no murmurs, no rubs, no clicks Abd:  soft, positive bowel sounds, no organomegally, no rebound, no guarding Ext:  2 plus pulses, no edema, no cyanosis, no clubbing Skin:  No rashes no nodules Neuro:  CN II through XII intact, motor grossly intact   DEVICE  See PaceArt for details. Atrial non-capture and ventricular undersensing are present.  Assess/Plan:   CHB - he is asymptomatic s/p PPM insertion. His escape is in the 30's. PPM - his Medtronic PPM non-capture in the atrium and undersensing in the ventricle. Because he is coming in with an atrial rate of 50-60 and pacing all of the time in the ventricle with good capture threshold and because of his advanced age, we will not pursue additional PM lead revision. Chronic diastolic heart failure - He will continue his current meds. I have asked him to increase his activity. 4. CAD - he is sedentary but he denies anginal symptoms.      Carleene Overlie Natalynn Pedone,MD

## 2022-02-08 NOTE — Patient Instructions (Addendum)
Medication Instructions:  Your physician recommends that you continue on your current medications as directed. Please refer to the Current Medication list given to you today.  Labwork:  We will draw your Labwork: BMET and BNP TODAY.  Testing/Procedures: None ordered.  Follow-Up: Your physician wants you to follow-up in: one year with Cristopher Peru, MD or one of the following Advanced Practice Providers on your designated Care Team:   Tommye Standard, [place] Legrand Como "Jonni Sanger" Chalmers Cater, [place]  Remote monitoring is used to monitor your Pacemaker of ICD from home. This monitoring reduces the number of office visits required to check your device to one time per year. It allows [place] to keep an eye on the functioning of your device to ensure it is working properly. You are scheduled for a device check from home on 04/14/2022. You may send your transmission at any time that day. If you have a wireless device, the transmission will be sent automatically. After your physician reviews your transmission, you will receive a postcard with your next transmission date.  Any Other Special Instructions Will Be Listed Below (If Applicable).  If you need a refill on your cardiac medications before your next appointment, please call your pharmacy.   Important Information About Sugar

## 2022-02-09 ENCOUNTER — Other Ambulatory Visit: Payer: Self-pay

## 2022-02-09 ENCOUNTER — Ambulatory Visit: Payer: Medicare HMO | Admitting: Vascular Surgery

## 2022-02-09 ENCOUNTER — Telehealth: Payer: Self-pay

## 2022-02-09 ENCOUNTER — Ambulatory Visit (HOSPITAL_COMMUNITY)
Admission: RE | Admit: 2022-02-09 | Discharge: 2022-02-09 | Disposition: A | Discharge status: Home / Self Care | Payer: Medicare HMO | Source: Ambulatory Visit | Attending: Vascular Surgery | Admitting: Vascular Surgery

## 2022-02-09 ENCOUNTER — Other Ambulatory Visit: Payer: Self-pay | Admitting: Medical

## 2022-02-09 ENCOUNTER — Encounter: Payer: Self-pay | Admitting: Vascular Surgery

## 2022-02-09 DIAGNOSIS — I739 Peripheral vascular disease, unspecified: Secondary | ICD-10-CM

## 2022-02-09 DIAGNOSIS — I10 Essential (primary) hypertension: Secondary | ICD-10-CM

## 2022-02-09 DIAGNOSIS — Z79899 Other long term (current) drug therapy: Secondary | ICD-10-CM

## 2022-02-09 DIAGNOSIS — M79606 Pain in leg, unspecified: Secondary | ICD-10-CM | POA: Insufficient documentation

## 2022-02-09 DIAGNOSIS — I480 Paroxysmal atrial fibrillation: Secondary | ICD-10-CM

## 2022-02-09 DIAGNOSIS — N189 Chronic kidney disease, unspecified: Secondary | ICD-10-CM

## 2022-02-09 LAB — BASIC METABOLIC PANEL
BUN/Creatinine Ratio: 20 (ref 10–24)
BUN: 37 mg/dL — ABNORMAL HIGH (ref 8–27)
CO2: 22 mmol/L (ref 20–29)
Calcium: 9.1 mg/dL (ref 8.6–10.2)
Chloride: 105 mmol/L (ref 96–106)
Creatinine, Ser: 1.84 mg/dL — ABNORMAL HIGH (ref 0.76–1.27)
Glucose: 89 mg/dL (ref 70–99)
Potassium: 4.6 mmol/L (ref 3.5–5.2)
Sodium: 141 mmol/L (ref 134–144)
eGFR: 35 mL/min/{1.73_m2} — ABNORMAL LOW (ref >59)

## 2022-02-09 LAB — PRO B NATRIURETIC PEPTIDE: NT-Pro BNP: 4421 pg/mL — ABNORMAL HIGH (ref 0–486)

## 2022-02-09 NOTE — Telephone Encounter (Signed)
Spoke with patient and discussed lab results.  Per Angelena Form, PA-C: Creat a little more elevated but may need to accept worsening of kidney function to treat heart failure. Will keep lasix the same: '40mg'$  in am and '20mg'$  in PM. BNP still elevated but improved. Lets keep on same and have him repeat a BMET in 2 weeks to follow trend.   Patient requests lab order be released to [place] so he can go to a local [place] facility to have labs drawn rather than drive up to our office.  BMET ordered and released to [place] patient he is due to have these labs drawn around 02/24/22. Patient verbalized understanding and expressed appreciation for assistance.

## 2022-02-09 NOTE — Telephone Encounter (Signed)
-----   Message from Blake Burgess, [place] sent at 02/09/2022  7:56 AM EDT ----- Creat a little more elevated but may need to accept worsening of kidney function to treat heart failure. Will keep lasix the same: '40mg'$  in am and '20mg'$  in PM. BNP still elevated but improved. Lets keep on same and have him repeat a BMET in 2 weeks to follow trend.

## 2022-02-09 NOTE — Telephone Encounter (Signed)
Eliquis '5mg'$  refill request received. Patient is 88 years old, weight-77.1kg, Crea- 1.84 on 02/08/2022, Diagnosis-Afib, and last seen by Dr. Lovena Le on 02/08/2022. Dose is inappropriate based on dosing criteria. According to lab notes by Valetta Fuller PA, she states: Blake Stanford, PA-C  02/09/2022  7:56 AM EDT     Creat a little more elevated but may need to accept worsening of kidney function to treat heart failure. Will keep lasix the same: '40mg'$  in am and '20mg'$  in PM. BNP still elevated but improved. Lets keep on same and have him repeat a BMET in 2 weeks to follow trend.  Will need to call pt to see how many tablets he has since he is having repeated labs & this will evaluate creatinine.   Called pt and spoke with son and he stated the pt has a couple days left of the eliquis and will need a refill. Advised and educated on the dose and that the dose may change when he has his repeat blood work in 2 weeks. Spoke with Gerald Stabs, Pharmacist regarding this and and advised to send the 30 day supply and reevaluate at that time for dose once labs are redrawn per the order.

## 2022-02-09 NOTE — Progress Notes (Signed)
Patient name: Blake Burgess MRN: [id number] DOB: 09/23/1931 Sex: male  REASON FOR CONSULT: Pain in legs with wounds (per referral)  HPI: Blake Burgess is a 88 y.o. male, with history of coronary artery disease status post CABG, severe COPD, A-fib on Eliquis, heart failure, stage III CKD, severe aortic stenosis status post TAVR now with a EF of 55% presents for what the referral states is pain in legs with wounds.  Some of the history is obtained from the patient's son.  Patient's son states he has no active wounds at this time and what abrasion he did have on the left calf healed.  ABIs today show noncompressible with monophasic waveforms.  Toe pressure of 0 on the left.  Toe pressure 41 on the right.  Patient denies any pain in his legs at night keeping him awake consistent with rest pain.  He has no open wounds.  He walks with a walker and really does not endorse any significant discomfort.  Has chronic numbness in both feet.  Past Medical History:  Diagnosis Date   Aortic stenosis    mild AS 09/2017 echo   Cancer [facility])    skin   COPD (chronic obstructive pulmonary disease) (HCC)    Stage 3 per patient's son   Coronary artery disease    a.  s/p CABG;   b. cath 4/12: EF 55%, 3vCAD, patent L-LAD, patent S-RCA, patent S-CFX (done after a false pos. ETT)   Dementia [facility])    Per son   Diverticular disease    GERD (gastroesophageal reflux disease)    GI bleed    Hemorrhoids    HH (hiatus hernia)    History of kidney stones    Hypertension    Osteoarthritis    Other and unspecified hyperlipidemia    Presence of permanent cardiac pacemaker    Schatzki's ring    Stroke [facility])     Past Surgical History:  Procedure Laterality Date   ARTERIOVENOUS GRAFT PLACEMENT W/ ENDOSCOPIC VEIN HARVEST     of the right leg greater spahenous vein. Surgeon: Tharon Aquas Trigt,M.D.   BACK SURGERY  2017   COLONOSCOPY  02/24/2010   Hemorrhoids, Diverticulosis. Performed at [place]. Normal terminal  ileum. Dr. June Leap, [place] ARTERY BYPASS GRAFT  06/21/2007   CABG x 3 Surgeon Ivin Poot, MD   EYE SURGERY     bilateral cataract removal   hip replace  06/09/2004   left hip Surgeon Pietro Cassis. Alvan Dame, MD   INTRAOPERATIVE TRANSTHORACIC ECHOCARDIOGRAM Left 03/03/2021   Procedure: INTRAOPERATIVE TRANSTHORACIC ECHOCARDIOGRAM;  Surgeon: Burnell Blanks, MD;  Location: [place];  Service: Open Heart Surgery;  Laterality: Left;   LAPAROSCOPIC CHOLECYSTECTOMY  2021   LEFT HEART CATH AND CORS/GRAFTS ANGIOGRAPHY N/A 11/04/2020   Procedure: LEFT HEART CATH AND CORS/GRAFTS ANGIOGRAPHY;  Surgeon: Troy Sine, MD;  Location: [place] CV LAB;  Service: Cardiovascular;  Laterality: N/A;   MULTIPLE EXTRACTIONS WITH ALVEOLOPLASTY N/A 02/03/2021   Procedure: MULTIPLE EXTRACTION WITH ALVEOLOPLASTY;  Surgeon: Charlaine Dalton, DMD;  Location: [place];  Service: Dentistry;  Laterality: N/A;   PACEMAKER IMPLANT N/A 10/03/2017   Procedure: PACEMAKER IMPLANT;  Surgeon: Evans Lance, MD;  Location: [place] [facility];  Service: Cardiovascular;  Laterality: N/A;   PACEMAKER LEAD REMOVAL N/A 04/30/2021   Procedure: PACEMAKER LEAD REMOVAL AND REPLACMENT;  Surgeon: Evans Lance, MD;  Location: [place];  Service: Cardiovascular;  Laterality: N/A;   REVERSE  SHOULDER ARTHROPLASTY Right 08/25/2018   Procedure: REVERSE SHOULDER ARTHROPLASTY;  Surgeon: Netta Cedars, MD;  Location: [place];  Service: Orthopedics;  Laterality: Right;   TRANSCATHETER AORTIC VALVE REPLACEMENT, TRANSFEMORAL Bilateral 03/03/2021   Procedure: TRANSCATHETER AORTIC VALVE REPLACEMENT, TRANSFEMORAL;  Surgeon: Burnell Blanks, MD;  Location: [place];  Service: Open Heart Surgery;  Laterality: Bilateral;   ULTRASOUND GUIDANCE FOR VASCULAR ACCESS Bilateral 03/03/2021   Procedure: ULTRASOUND GUIDANCE FOR VASCULAR ACCESS;  Surgeon: Burnell Blanks, MD;  Location: [place];  Service: Open Heart Surgery;  Laterality: Bilateral;     Family History  Problem Relation Age of Onset   Heart attack Mother    Hypertension Mother    Diabetes Father    Diabetes Brother    Diabetes Sister     SOCIAL HISTORY: Social History   Socioeconomic History   Marital status: Married    Spouse name: Not on file   Number of children: 2   Years of education: Not on file   Highest education level: Not on file  Occupational History   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 Daily Caffeine: 24 oz pepsi and 1 cup coffee.    Social Determinants of Health   Financial Resource Strain: Not on file  Food Insecurity: Not on file  Transportation Needs: Not on file  Physical Activity: Not on file  Stress: Not on file  Social Connections: Not on file  Intimate Partner Violence: Not on file    No Known Allergies  Current Outpatient Medications  Medication Sig Dispense Refill   acetaminophen (TYLENOL) 500 MG tablet Take 1,000 mg by mouth every 8 (eight) hours as needed for moderate pain.     albuterol (PROVENTIL) (2.5 MG/3ML) 0.083% nebulizer solution Take 3 mLs (2.5 mg total) by nebulization every 6 (six) hours as needed for wheezing or shortness of breath. 75 mL 12   amLODipine (NORVASC) 2.5 MG tablet Take 2.5 mg by mouth in the morning.     amoxicillin (AMOXIL) 500 MG capsule      apixaban (ELIQUIS) 5 MG TABS tablet TAKE 1 TABLET BY MOUTH TWICE A DAY 60 tablet 5   atorvastatin (LIPITOR) 40 MG tablet Take 40 mg by mouth in the morning.     cyanocobalamin (,VITAMIN B-12,) 1000 MCG/ML injection Inject 1,000 mcg into the muscle every 30 (thirty) days.     donepezil (ARICEPT) 10 MG tablet Take 10 mg by mouth at bedtime.     enalapril  (VASOTEC) 20 MG tablet Take 20 mg by mouth 2 (two) times daily.     Fluticasone-Umeclidin-Vilant (TRELEGY ELLIPTA) 100-62.5-25 MCG/ACT AEPB Inhale 1 puff into the lungs daily. 60 each 0   furosemide (LASIX) 40 MG tablet TAKE 40 MG INA THE AM AND 20 MG IN THE PM. 135 tablet 3   isosorbide mononitrate (IMDUR) 60 MG 24 hr tablet TAKE 1 TABLET BY MOUTH EVERY DAY 90 tablet 3   Lidocaine 4 % PTCH Apply 1 patch topically daily as needed (pain).     metoprolol succinate (TOPROL-XL) 50 MG 24 hr tablet TAKE 1 TABLET BY MOUTH EVERY DAY 90 tablet 3   nitroGLYCERIN (  NITROSTAT) 0.4 MG SL tablet Place 1 tablet (0.4 mg total) under the tongue every 5 (five) minutes as needed for chest pain. 25 tablet 3   omeprazole (PRILOSEC) 20 MG capsule Take 20 mg by mouth in the morning.     tamsulosin (FLOMAX) 0.4 MG CAPS capsule Take 0.4 mg by mouth in the morning.     No current facility-administered medications for this visit.    REVIEW OF SYSTEMS:  '[X]'$  denotes positive finding, '[ ]'$  denotes negative finding Cardiac  Comments:  Chest pain or chest pressure:    Shortness of breath upon exertion:    Short of breath when lying flat:    Irregular heart rhythm:        Vascular    Pain in calf, thigh, or hip brought on by ambulation:    Pain in feet at night that wakes you up from your sleep:     Blood clot in your veins:    Leg swelling:         Pulmonary    Oxygen at home:    Productive cough:     Wheezing:         Neurologic    Sudden weakness in arms or legs:     Sudden numbness in arms or legs:     Sudden onset of difficulty speaking or slurred speech:    Temporary loss of vision in one eye:     Problems with dizziness:         Gastrointestinal    Blood in stool:     Vomited blood:         Genitourinary    Burning when urinating:     Blood in urine:        Psychiatric    Major depression:         Hematologic    Bleeding problems:    Problems with blood clotting too easily:        Skin     Rashes or ulcers:        Constitutional    Fever or chills:      PHYSICAL EXAM: There were no vitals filed for this visit.  GENERAL: The patient is a well-nourished male, in no acute distress. The vital signs are documented above. CARDIAC: There is a regular rate and rhythm.  VASCULAR:  1+ palpable femoral pulses bilaterally No palpable pedal pulses No open wounds or ulcerations appreciated PULMONARY: No respiratory distress. ABDOMEN: Soft and non-tender. MUSCULOSKELETAL: There are no major deformities or cyanosis. NEUROLOGIC: No focal weakness or paresthesias are detected. SKIN: There are no ulcers or rashes noted. PSYCHIATRIC: The patient has a normal affect.     DATA:   ABI's today are noncompressible with TP 41 right and 0 left  Assessment/Plan:  88 year old male with multiple medical comorbidities as noted above that presents as a referral for with the notes suggest are evaluation of leg pain and nonhealing wounds.  Certainly has evidence of peripheral arterial disease based on ABIs that are noncompressible with toe pressure of 0 on the left and a diminished 41 on the right.  He has no palpable pedal pulses on exam.  That being said, I do not appreciate any open wounds at this time and his son is here for the visit and agrees.  Patient states he sleeps at night and has no pain.  He has chronic numbness in both feet that has been present for years.  Really does not endorse any claudication symptoms and  this may be because he has limited ambulation with a walker and/or cane and spends most of his time sitting.  Overall he is quite frail.  Would favor continued surveillance with no intervention at this time given no clear indication that he has any symptoms from his PAD.  We will see him in 6 months.   Marty Heck, MD [facility] Office: [phone]

## 2022-02-27 LAB — BASIC METABOLIC PANEL
BUN/Creatinine Ratio: 21 (ref 10–24)
BUN: 32 mg/dL — ABNORMAL HIGH (ref 8–27)
CO2: 17 mmol/L — ABNORMAL LOW (ref 20–29)
Calcium: 8.9 mg/dL (ref 8.6–10.2)
Chloride: 107 mmol/L — ABNORMAL HIGH (ref 96–106)
Creatinine, Ser: 1.51 mg/dL — ABNORMAL HIGH (ref 0.76–1.27)
Glucose: 100 mg/dL — ABNORMAL HIGH (ref 70–99)
Potassium: 4.9 mmol/L (ref 3.5–5.2)
Sodium: 143 mmol/L (ref 134–144)
eGFR: 44 mL/min/{1.73_m2} — ABNORMAL LOW (ref >59)

## 2022-03-05 IMAGING — CT CT ANGIO CHEST
2 of 7 series · 15 of 36 positions shown · IV contrast (omnipaque)
Comparison: Chest CTA 07/10/2020.  In.

CLINICAL DATA: 88-year-old male with history of severe aortic
stenosis. Preprocedural study prior to potential transcatheter
aortic valve replacement (TAVR) procedure.

EXAM:
CT ANGIOGRAPHY CHEST, ABDOMEN AND PELVIS
TECHNIQUE: Multidetector CT imaging through the chest, abdomen and pelvis was
performed using the standard protocol during bolus administration of
intravenous contrast. Multiplanar reconstructed images and MIPs were
obtained and reviewed to evaluate the vascular anatomy.
CONTRAST:  100mL OMNIPAQUE IOHEXOL 350 MG/ML SOLN

[Series 3: ax thins · axial · 0.59mm/px · z∈[+864,+1540]mm · 14 of 757 slices shown]
[im 40/757  lung]
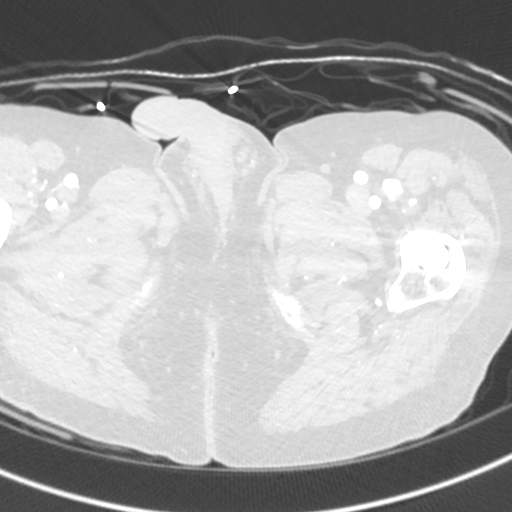
[im 80/757  mediastinal]
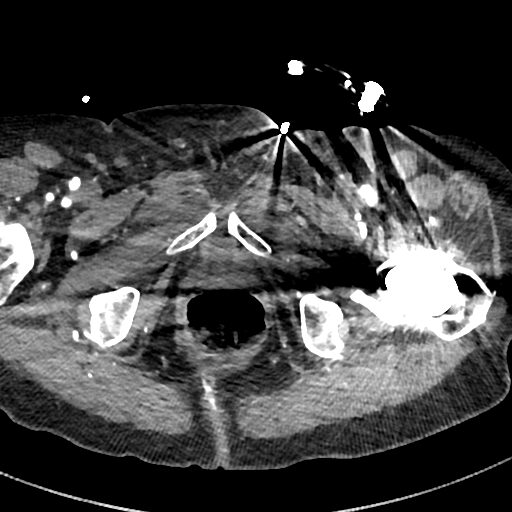
[im 160/757  lung]
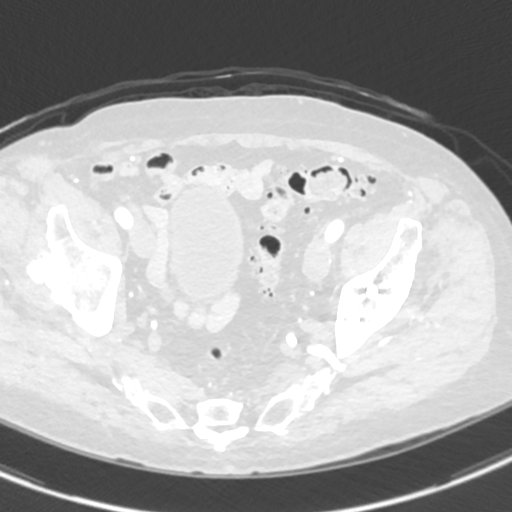
[im 199/757  mediastinal]
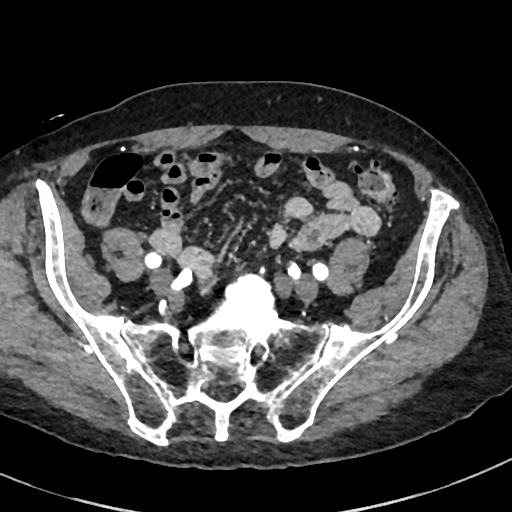
[im 239/757  lung]
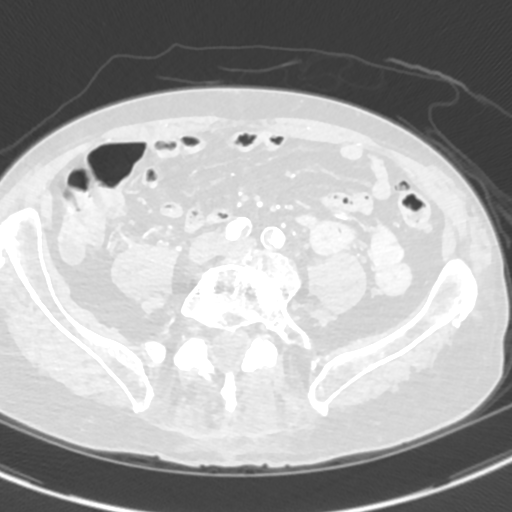
[im 319/757  mediastinal]
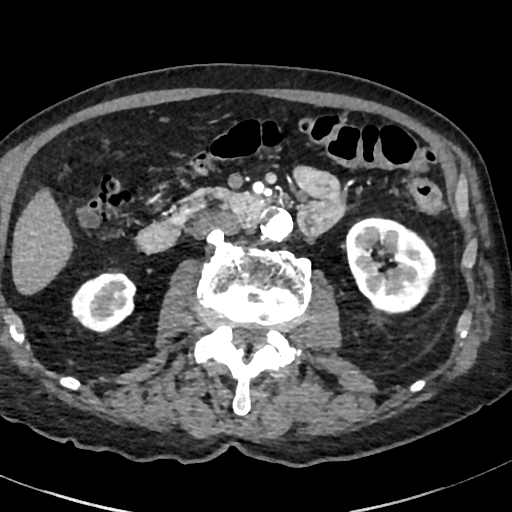
[im 359/757  lung]
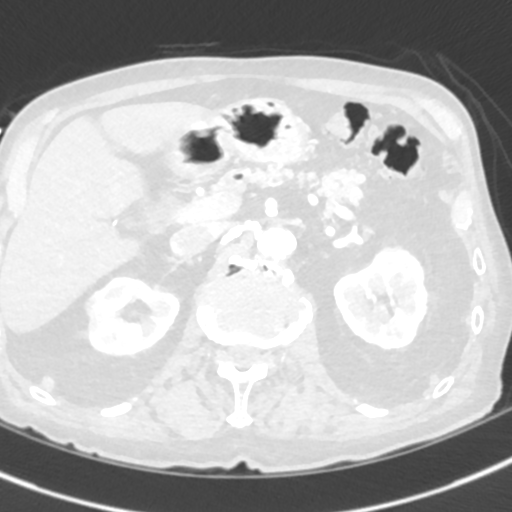
[im 398/757  mediastinal]
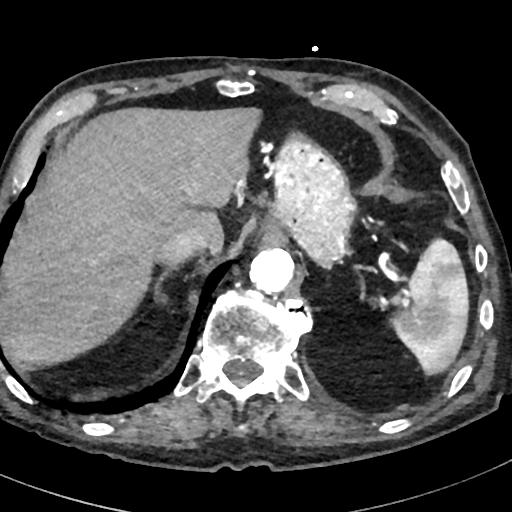
[im 438/757  lung]
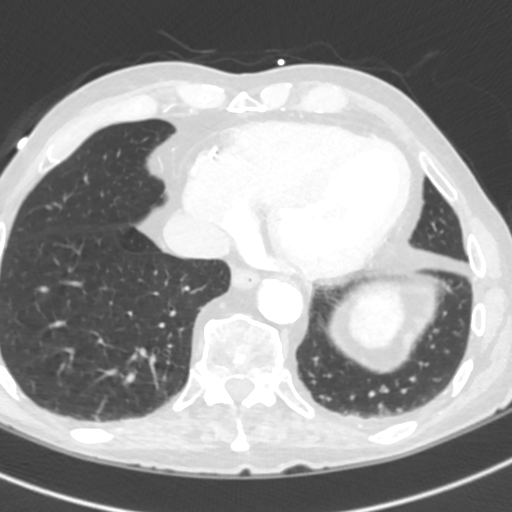
[im 518/757  mediastinal]
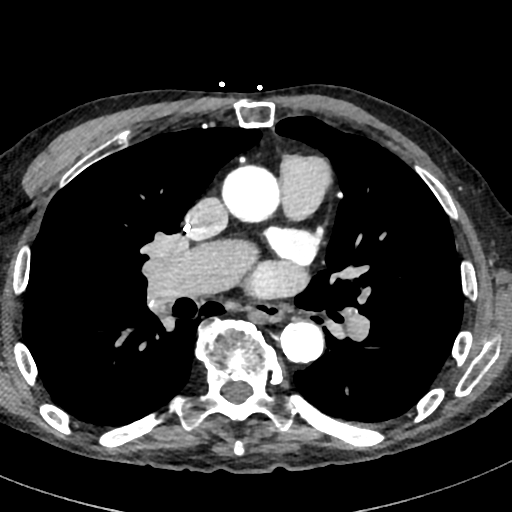
[im 558/757  lung]
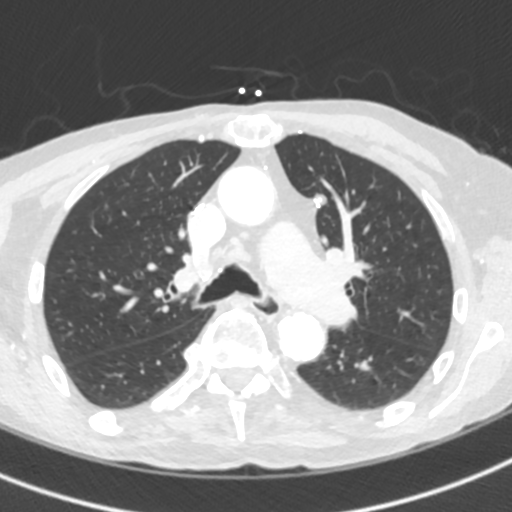
[im 597/757  mediastinal]
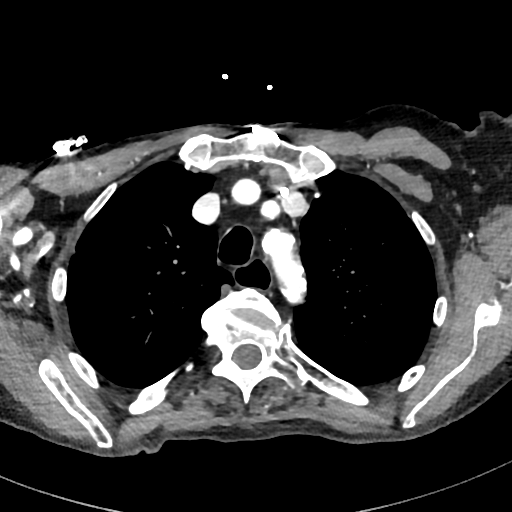
[im 677/757  lung]
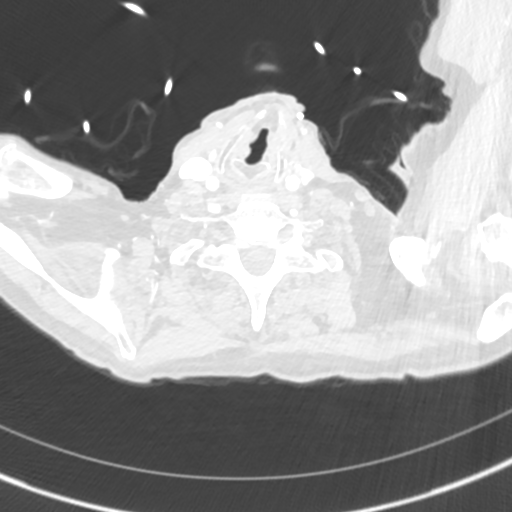
[im 717/757  mediastinal]
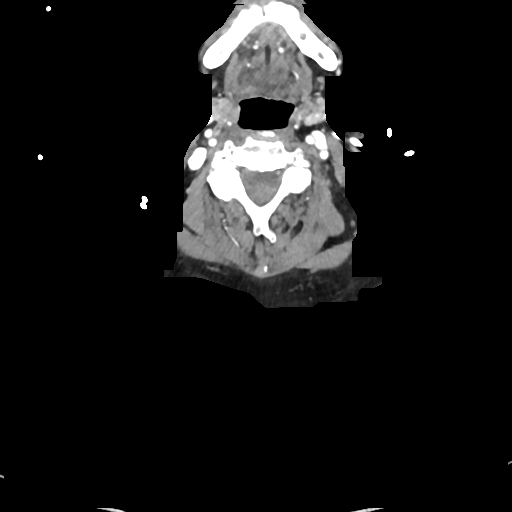

[Series 6: cor · coronal · 0.67mm/px · 1 of 183 slices shown]
[im 92/183  mediastinal]
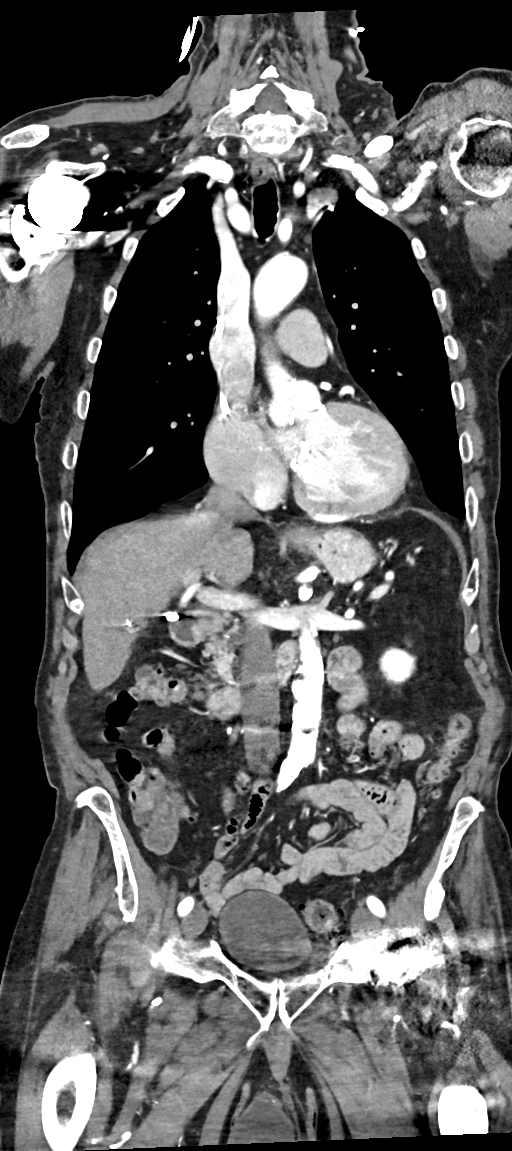

[15 of 36 positions shown; findings below may reference images not displayed]

FINDINGS: CTA CHEST FINDINGS

Cardiovascular: Heart size is mildly enlarged with left atrial
dilatation. There is no significant pericardial fluid, thickening or
pericardial calcification. There is aortic atherosclerosis, as well
as atherosclerosis of the great vessels of the mediastinum and the
coronary arteries, including calcified atherosclerotic plaque in the
left main, left anterior descending, left circumflex and right
coronary arteries. Status post median sternotomy for CABG including
[REDACTED] to the LAD. Severe thickening and calcification of the aortic
valve. Calcifications of the mitral annulus.

Mediastinum/Lymph Nodes: No pathologically enlarged mediastinal or
hilar lymph nodes. Please note that accurate exclusion of hilar
adenopathy is limited on noncontrast CT scans. Esophagus is
unremarkable in appearance. No axillary lymphadenopathy.

Lungs/Pleura: 2 mm right upper lobe pulmonary nodule (axial image 37
of series 4), stable compared to the prior examination, likely
benign. No other larger more suspicious appearing pulmonary nodules
or masses are noted. No acute consolidative airspace disease. No
pleural effusions.

Musculoskeletal/Soft Tissues: Median sternotomy wires. There are no
aggressive appearing lytic or blastic lesions noted in the
visualized portions of the skeleton.

CTA ABDOMEN AND PELVIS FINDINGS

Hepatobiliary: No suspicious cystic or solid hepatic lesions. No
intra or extrahepatic biliary ductal dilatation. Status post
cholecystectomy.

Pancreas: No pancreatic mass. No pancreatic ductal dilatation. No
pancreatic or peripancreatic fluid collections or inflammatory
changes.

Spleen: Unremarkable.

Adrenals/Urinary Tract: Multifocal cortical scarring in the kidneys
bilaterally (right greater than left). No suspicious renal lesions.
No hydroureteronephrosis. Bilateral adrenal glands are normal in
appearance. Urinary bladder is partially obscured by beam hardening
artifact from the patient's left hip arthroplasty. Visualized
portions of the urinary bladder are unremarkable.

Stomach/Bowel: The appearance of the stomach is normal. No
pathologic dilatation of small bowel or colon. Numerous colonic
diverticulae are noted, without surrounding inflammatory changes to
suggest an acute diverticulitis at this time. Normal appendix.

Vascular/Lymphatic: Aortic atherosclerosis, without evidence of
aneurysm or dissection in the abdominal or pelvic vasculature.
Vascular findings and measurements pertinent to potential TAVR
procedure, as detailed above. No lymphadenopathy noted in the
abdomen or pelvis.

Reproductive: Prostate gland and seminal vesicles are unremarkable
in appearance. Right-sided hydrocele incompletely imaged.

Other: No significant volume of ascites.  No pneumoperitoneum.

Musculoskeletal: Status post PLIF from L3-L5 with interbody cage at
the L4-L5 interspace. There are no aggressive appearing lytic or
blastic lesions noted in the visualized portions of the skeleton.
Status post left hip arthroplasty.

VASCULAR MEASUREMENTS PERTINENT TO TAVR:

AORTA:

Minimal Aortic Uiameter-XG x 13 mm

Severity of Aortic Calcification-severe

RIGHT PELVIS:

Right Common Iliac Artery -

Minimal 3iameter-U.Z x 7.1 mm

Tortuosity-mild

Calcification-moderate to severe

Right External Iliac Artery -

Minimal 1iameter-D.O x 8.6 mm

Tortuosity-mild

Calcification-mild

Right Common Femoral Artery -

Minimal Aiameter-9.T x 5.1 mm

Tortuosity-mild

Calcification-moderate to severe

LEFT PELVIS:

Left Common Iliac Artery -

Minimal Miameter-K.B x 4.8 mm

Tortuosity-mild

Calcification-moderate to severe

Left External Iliac Artery -

Minimal Aiameter-Y.6 x 8.7 mm

Tortuosity-mild

Calcification-mild

Left Common Femoral Artery -

Minimal Miameter-8.U x 1.4 mm

Tortuosity-mild

Calcification-moderate to severe

Review of the MIP images confirms the above findings.
IMPRESSION: 1. Vascular findings and measurements pertinent to potential TAVR
procedure, as detailed above.
2. Severe thickening calcification of the aortic valve, compatible
with reported clinical history of severe aortic stenosis.
3. Cardiomegaly with left atrial dilatation.
4. Aortic atherosclerosis, in addition to left main and 3 vessel
coronary artery disease. Status post median sternotomy for CABG
including [REDACTED] to the LAD.
5. 2 mm right upper lobe pulmonary nodule, stable compared to the
prior study, nonspecific, but statistically likely benign.
6. Colonic diverticulosis without evidence of acute diverticulitis
at this time.
7. Additional incidental findings, as above.

## 2022-04-14 ENCOUNTER — Ambulatory Visit (INDEPENDENT_AMBULATORY_CARE_PROVIDER_SITE_OTHER): Payer: Medicare HMO

## 2022-04-14 DIAGNOSIS — I442 Atrioventricular block, complete: Secondary | ICD-10-CM | POA: Diagnosis not present

## 2022-04-15 LAB — CUP PACEART REMOTE DEVICE CHECK
Battery Remaining Longevity: 43 mo
Battery Voltage: 2.94 V
Brady Statistic AP VP Percent: 70.54 %
Brady Statistic AP VS Percent: 0.37 %
Brady Statistic AS VP Percent: 28.71 %
Brady Statistic AS VS Percent: 0.38 %
Brady Statistic RA Percent Paced: 71.1 %
Brady Statistic RV Percent Paced: 99.25 %
Date Time Interrogation Session: 20230823012054
Implantable Lead Implant Date: 20190211
Implantable Lead Implant Date: 20220908
Implantable Lead Location: 753859
Implantable Lead Location: 753860
Implantable Lead Model: 3830
Implantable Lead Model: 5076
Implantable Pulse Generator Implant Date: 20190211
Lead Channel Impedance Value: 247 Ohm
Lead Channel Impedance Value: 323 Ohm
Lead Channel Impedance Value: 342 Ohm
Lead Channel Impedance Value: 399 Ohm
Lead Channel Pacing Threshold Amplitude: 1.875 V
Lead Channel Pacing Threshold Amplitude: 2.5 V
Lead Channel Pacing Threshold Pulse Width: 0.4 ms
Lead Channel Pacing Threshold Pulse Width: 0.4 ms
Lead Channel Sensing Intrinsic Amplitude: 0.5 mV
Lead Channel Sensing Intrinsic Amplitude: 0.5 mV
Lead Channel Sensing Intrinsic Amplitude: 1.125 mV
Lead Channel Sensing Intrinsic Amplitude: 1.125 mV
Lead Channel Setting Pacing Amplitude: 2 V
Lead Channel Setting Pacing Amplitude: 2.5 V
Lead Channel Setting Pacing Pulse Width: 0.4 ms
Lead Channel Setting Sensing Sensitivity: 0.6 mV

## 2022-04-21 ENCOUNTER — Other Ambulatory Visit: Payer: Self-pay | Admitting: Internal Medicine

## 2022-04-21 DIAGNOSIS — I480 Paroxysmal atrial fibrillation: Secondary | ICD-10-CM

## 2022-04-21 NOTE — Telephone Encounter (Signed)
Prescription refill request for Eliquis received. Indication: CVA Last office visit: 02/08/22  Beckie Salts MD Scr: 1.90 on 04/07/22 Age:  88 Weight: 75.8kg  Pt is on Eliquis '5mg'$  twice daily.  Based on above findings Eliquis 2.'5mg'$  twice daily would be the appropriate dose.  Message sent to Pharmacist to approved dose adjustment.

## 2022-04-28 NOTE — Telephone Encounter (Signed)
Yes, recommend changing to 2.'5mg'$  BID

## 2022-04-28 NOTE — Telephone Encounter (Signed)
Notified pt of Eliquis dose reduction and new RX sent to pharmacy.

## 2022-05-11 NOTE — Progress Notes (Signed)
Remote pacemaker transmission.   

## 2022-06-26 NOTE — Progress Notes (Unsigned)
Cardiology Office Note   Date:  06/28/2022   ID:  Blake Burgess, DOB 06-10-1932, MRN [id number]  PCP:  Raelene Bott, MD  Cardiologist:   Minus Breeding, MD   Chief Complaint  Patient presents with   Fatigue       History of Present Illness: Blake Burgess is a 88 y.o. male who presents for follow up of CAD and CABG.  In 2012 he did have an abnormal stress test followed by catheterization which demonstrated patent bypass grafts.  His last stress in 2017 was unremarkable.  In Feb 2019 he had chest pain and SOB and came to the ED and was noted to be in CHB.  He had a pacemaker placed.  He was again in the hospital in late April 2022 at [facility] with chest pain.  There was no objective evidence of ischemia and he was not admitted.  An echocardiogram suggested that his EF was 45 to 50% which is about the same or perhaps mildly reduced from below.  There were no regional wall motion abnormalities.  He has some mild mitral vegetation.  He has severe thickening of his aortic valve his mean gradient is 21.9.  This had progressed since 2019.  He underwent a successful TAVR with a 26 mm Edwards Sapien 3 Ultra THV via the TF approach on 03/03/21.  He had revision of his pacemaker for an atrial lead malfunction in Sept.    He returns for follow up.  Since I last saw him he has had no new cardiovascular complaints.  His son provides most of the answers.  He says his dad and sleeping a lot but he is not having any trouble with breathing.  There is been no mention of shortness of breath.  He is not having any PND or orthopnea.  There is been no weight gain or edema.  He gets around slowly.   Past Medical History:  Diagnosis Date   Aortic stenosis    mild AS 09/2017 echo   Cancer [facility])    skin   COPD (chronic obstructive pulmonary disease) (HCC)    Stage 3 per patient's son   Coronary artery disease    a.  s/p CABG;   b. cath 4/12: EF 55%, 3vCAD, patent L-LAD, patent S-RCA, patent S-CFX (done after  a false pos. ETT)   Dementia [facility])    Per son   Diverticular disease    GERD (gastroesophageal reflux disease)    GI bleed    Hemorrhoids    HH (hiatus hernia)    History of kidney stones    Hypertension    Osteoarthritis    Other and unspecified hyperlipidemia    Presence of permanent cardiac pacemaker    Schatzki's ring    Stroke [facility])     Past Surgical History:  Procedure Laterality Date   ARTERIOVENOUS GRAFT PLACEMENT W/ ENDOSCOPIC VEIN HARVEST     of the right leg greater spahenous vein. Surgeon: Tharon Aquas Trigt,M.D.   BACK SURGERY  2017   COLONOSCOPY  02/24/2010   Hemorrhoids, Diverticulosis. Performed at [facility]. Normal terminal ileum. Dr. June Leap, [place] ARTERY BYPASS GRAFT  06/21/2007   CABG x 3 Surgeon Ivin Poot, MD   EYE SURGERY     bilateral cataract removal   hip replace  06/09/2004   left hip Surgeon Pietro Cassis. Alvan Dame, MD   INTRAOPERATIVE TRANSTHORACIC ECHOCARDIOGRAM Left 03/03/2021   Procedure: INTRAOPERATIVE TRANSTHORACIC ECHOCARDIOGRAM;  Surgeon: Burnell Blanks,  MD;  Location: [facility] OR;  Service: Open Heart Surgery;  Laterality: Left;   LAPAROSCOPIC CHOLECYSTECTOMY  2021   LEFT HEART CATH AND CORS/GRAFTS ANGIOGRAPHY N/A 11/04/2020   Procedure: LEFT HEART CATH AND CORS/GRAFTS ANGIOGRAPHY;  Surgeon: Troy Sine, MD;  Location: [place] CV LAB;  Service: Cardiovascular;  Laterality: N/A;   MULTIPLE EXTRACTIONS WITH ALVEOLOPLASTY N/A 02/03/2021   Procedure: MULTIPLE EXTRACTION WITH ALVEOLOPLASTY;  Surgeon: Charlaine Dalton, DMD;  Location: [place];  Service: Dentistry;  Laterality: N/A;   PACEMAKER IMPLANT N/A 10/03/2017   Procedure: PACEMAKER IMPLANT;  Surgeon: Evans Lance, MD;  Location: [place] CV LAB;  Service: Cardiovascular;  Laterality: N/A;   PACEMAKER LEAD REMOVAL N/A 04/30/2021   Procedure: PACEMAKER LEAD REMOVAL AND REPLACMENT;  Surgeon: Evans Lance, MD;  Location: [place];  Service: Cardiovascular;  Laterality: N/A;    REVERSE SHOULDER ARTHROPLASTY Right 08/25/2018   Procedure: REVERSE SHOULDER ARTHROPLASTY;  Surgeon: Netta Cedars, MD;  Location: [place];  Service: Orthopedics;  Laterality: Right;   TRANSCATHETER AORTIC VALVE REPLACEMENT, TRANSFEMORAL Bilateral 03/03/2021   Procedure: TRANSCATHETER AORTIC VALVE REPLACEMENT, TRANSFEMORAL;  Surgeon: Burnell Blanks, MD;  Location: [place];  Service: Open Heart Surgery;  Laterality: Bilateral;   ULTRASOUND GUIDANCE FOR VASCULAR ACCESS Bilateral 03/03/2021   Procedure: ULTRASOUND GUIDANCE FOR VASCULAR ACCESS;  Surgeon: Burnell Blanks, MD;  Location: [place];  Service: Open Heart Surgery;  Laterality: Bilateral;     Current Outpatient Medications  Medication Sig Dispense Refill   acetaminophen (TYLENOL) 500 MG tablet Take 1,000 mg by mouth every 8 (eight) hours as needed for moderate pain.     albuterol (PROVENTIL) (2.5 MG/3ML) 0.083% nebulizer solution Take 3 mLs (2.5 mg total) by nebulization every 6 (six) hours as needed for wheezing or shortness of breath. 75 mL 12   amLODipine (NORVASC) 2.5 MG tablet Take 2.5 mg by mouth in the morning.     amoxicillin (AMOXIL) 500 MG capsule      apixaban (ELIQUIS) 2.5 MG TABS tablet Take 1 tablet (2.5 mg total) by mouth 2 (two) times daily. 60 tablet 3   atorvastatin (LIPITOR) 40 MG tablet Take 40 mg by mouth in the morning.     cyanocobalamin (,VITAMIN B-12,) 1000 MCG/ML injection Inject 1,000 mcg into the muscle every 30 (thirty) days.     donepezil (ARICEPT) 10 MG tablet Take 10 mg by mouth at bedtime.     enalapril (VASOTEC) 20 MG tablet Take 20 mg by mouth 2 (two) times daily.     Fluticasone-Umeclidin-Vilant (TRELEGY ELLIPTA) 100-62.5-25 MCG/ACT AEPB Inhale 1 puff into the lungs daily. 60 each 0   furosemide (LASIX) 40 MG tablet TAKE 40 MG INA THE AM AND 20 MG IN THE PM. 135 tablet 3   isosorbide mononitrate (IMDUR) 60 MG 24 hr tablet TAKE 1 TABLET BY MOUTH EVERY DAY 90 tablet 3   Lidocaine 4 % PTCH  Apply 1 patch topically daily as needed (pain).     nitroGLYCERIN (NITROSTAT) 0.4 MG SL tablet Place 1 tablet (0.4 mg total) under the tongue every 5 (five) minutes as needed for chest pain. 25 tablet 3   omeprazole (PRILOSEC) 20 MG capsule Take 20 mg by mouth in the morning.     tamsulosin (FLOMAX) 0.4 MG CAPS capsule Take 0.4 mg by mouth in the morning.     metoprolol succinate (TOPROL-XL) 25 MG 24 hr tablet Take 1 tablet (25 mg total) by mouth daily. Take with or immediately following a  meal. 90 tablet 1   No current facility-administered medications for this visit.    Allergies:   Patient has no known allergies.    ROS:  Please see the history of present illness.   Otherwise, review of systems are positive for loose stools.   All other systems are reviewed and negative.    PHYSICAL EXAM: VS:  BP (!) 116/58 (BP Location: Left Arm, Patient Position: Sitting, Cuff Size: Normal)   Pulse (!) 53   Ht '5\' 9"'$  (1.753 m)   Wt 166 lb (75.3 kg)   BMI 24.51 kg/m  , BMI Body mass index is 24.51 kg/m.  GENERAL:  Well appearing NECK:  No jugular venous distention, waveform within normal limits, carotid upstroke brisk and symmetric, no bruits, no thyromegaly LUNGS:  Clear to auscultation bilaterally CHEST: Well-healed pacemaker pocket HEART:  PMI not displaced or sustained,S1 and S2 within normal limits, no S3, no S4, no clicks, no rubs, 2 out of 6 apical systolic murmur and radiating out the right aortic outflow tract, no diastolic murmurs ABD:  Flat, positive bowel sounds normal in frequency in pitch, no bruits, no rebound, no guarding, no midline pulsatile mass, no hepatomegaly, no splenomegaly EXT:  2 plus pulses throughout, no edema, no cyanosis no clubbing   EKG:  EKG is  ordered today. Normal sinus rhythm, probable junctional beats, premature atrial contractions, no acute ST-T wave changes.  Failure of lead capture  Diagnostic cath  2022 Dominance: Right     Recent Labs: 02/08/2022:  NT-Pro BNP 4,421 02/26/2022: BUN 32; Creatinine, Ser 1.51; Potassium 4.9; Sodium 143    Lipid Panel    Component Value Date/Time   CHOL 112 10/01/2017 0619   TRIG 68 10/01/2017 0619   HDL 39 (L) 10/01/2017 0619   CHOLHDL 2.9 10/01/2017 0619   VLDL 14 10/01/2017 0619   LDLCALC 59 10/01/2017 0619      Wt Readings from Last 3 Encounters:  06/28/22 166 lb (75.3 kg)  02/09/22 170 lb (77.1 kg)  02/08/22 167 lb (75.8 kg)      Other studies Reviewed: Additional studies/ records that were reviewed today include: None Review of the above records demonstrates:  Please see elsewhere in the note.     ASSESSMENT AND PLAN:   CHB/PACEMAKER PLACEMENT:     He has pacemaker dysfunction as described.  He is now status post revision .  However, he has continued issues with atrial non capture and undersensing of the ventricle.    However, he saw Dr. Lovena Le earlier this year and the plan is not to pursue another revision.  I will reduce his beta-blocker to 25 mg daily  ATRIAL FIB:      Mr. SINJIN AMERO has a CHA2DS2 - VASc score of 5.   He will continue the meds as listed.  He tolerates anticoagulation.  CAD:   The patient has no new sypmtoms.  No further cardiovascular testing is indicated.  We will continue with aggressive risk reduction and meds as listed.  HTN:     Blood pressure is running on the low side and I will make changes as below.  His blood pressure is borderline I would not make med changes as above  CHRONIC SYSTOLIC AND DIASTOLIC HF:    He seems to be euvolemic.  No change in therapy.  AS/TAVR:  He is status post TAVR.   He had stable anatomy in June.  He does have moderate mitral valve regurgitation which we will follow clinically.  Current medicines are reviewed at length with the patient today.  The patient does not have concerns regarding medicines.  The following changes have been made:   None  Labs/ tests ordered today include:   None  Orders Placed This Encounter   Procedures   EKG 12-Lead     Disposition:   FU with 6 months.    Signed, Minus Breeding, MD  06/28/2022 10:29 AM    [facility]

## 2022-06-28 ENCOUNTER — Encounter: Payer: Self-pay | Admitting: Cardiology

## 2022-06-28 ENCOUNTER — Ambulatory Visit: Payer: Medicare HMO | Attending: Cardiology | Admitting: Cardiology

## 2022-06-28 VITALS — BP 116/58 | HR 53 | Ht 69.0 in | Wt 166.0 lb

## 2022-06-28 DIAGNOSIS — I25810 Atherosclerosis of coronary artery bypass graft(s) without angina pectoris: Secondary | ICD-10-CM | POA: Diagnosis not present

## 2022-06-28 DIAGNOSIS — Z952 Presence of prosthetic heart valve: Secondary | ICD-10-CM

## 2022-06-28 DIAGNOSIS — I10 Essential (primary) hypertension: Secondary | ICD-10-CM | POA: Diagnosis not present

## 2022-06-28 DIAGNOSIS — I5042 Chronic combined systolic (congestive) and diastolic (congestive) heart failure: Secondary | ICD-10-CM

## 2022-06-28 DIAGNOSIS — I480 Paroxysmal atrial fibrillation: Secondary | ICD-10-CM

## 2022-06-28 MED ORDER — METOPROLOL SUCCINATE ER 25 MG PO TB24: 25.0000 mg | ORAL_TABLET | Freq: Every day | ORAL | 1 refills | Status: DC

## 2022-06-28 NOTE — Patient Instructions (Signed)
Medication Instructions:  Decrease Metoprolol to 25 mg daily   *If you need a refill on your cardiac medications before your next appointment, please call your pharmacy*   Follow-Up: At [facility], you and your health needs are our priority.  As part of our continuing mission to provide you with exceptional heart care, we have created designated Provider Care Teams.  These Care Teams include your primary Cardiologist (physician) and Advanced Practice Providers (APPs -  Physician Assistants and Nurse Practitioners) who all work together to provide you with the care you need, when you need it.  We recommend signing up for the patient portal called "MyChart".  Sign up information is provided on this After Visit Summary.  MyChart is used to connect with patients for Virtual Visits (Telemedicine).  Patients are able to view lab/test results, encounter notes, upcoming appointments, etc.  Non-urgent messages can be sent to your provider as well.   To learn more about what you can do with MyChart, go to [email or web address].    Your next appointment:   6 month(s)  The format for your next appointment:   In Person  Provider:   Minus Breeding, MD

## 2022-07-14 ENCOUNTER — Ambulatory Visit (INDEPENDENT_AMBULATORY_CARE_PROVIDER_SITE_OTHER): Payer: Medicare HMO

## 2022-07-14 DIAGNOSIS — I442 Atrioventricular block, complete: Secondary | ICD-10-CM

## 2022-07-14 LAB — CUP PACEART REMOTE DEVICE CHECK
Battery Remaining Longevity: 38 mo
Battery Voltage: 2.94 V
Brady Statistic AP VP Percent: 81.06 %
Brady Statistic AP VS Percent: 0.16 %
Brady Statistic AS VP Percent: 17.36 %
Brady Statistic AS VS Percent: 1.42 %
Brady Statistic RA Percent Paced: 82.35 %
Brady Statistic RV Percent Paced: 98.42 %
Date Time Interrogation Session: 20231122002126
Implantable Lead Connection Status: 753985
Implantable Lead Connection Status: 753985
Implantable Lead Implant Date: 20190211
Implantable Lead Implant Date: 20220908
Implantable Lead Location: 753859
Implantable Lead Location: 753860
Implantable Lead Model: 3830
Implantable Lead Model: 5076
Implantable Pulse Generator Implant Date: 20190211
Lead Channel Impedance Value: 228 Ohm
Lead Channel Impedance Value: 342 Ohm
Lead Channel Impedance Value: 342 Ohm
Lead Channel Impedance Value: 437 Ohm
Lead Channel Pacing Threshold Amplitude: 1.875 V
Lead Channel Pacing Threshold Amplitude: 2.5 V
Lead Channel Pacing Threshold Pulse Width: 0.4 ms
Lead Channel Pacing Threshold Pulse Width: 0.4 ms
Lead Channel Sensing Intrinsic Amplitude: 0.5 mV
Lead Channel Sensing Intrinsic Amplitude: 0.5 mV
Lead Channel Sensing Intrinsic Amplitude: 1.125 mV
Lead Channel Sensing Intrinsic Amplitude: 1.125 mV
Lead Channel Setting Pacing Amplitude: 2 V
Lead Channel Setting Pacing Amplitude: 2.5 V
Lead Channel Setting Pacing Pulse Width: 0.4 ms
Lead Channel Setting Sensing Sensitivity: 0.6 mV
Zone Setting Status: 755011
Zone Setting Status: 755011

## 2022-07-16 ENCOUNTER — Other Ambulatory Visit: Payer: Self-pay | Admitting: Cardiology

## 2022-07-16 DIAGNOSIS — R0602 Shortness of breath: Secondary | ICD-10-CM

## 2022-07-16 DIAGNOSIS — I480 Paroxysmal atrial fibrillation: Secondary | ICD-10-CM

## 2022-07-16 DIAGNOSIS — I10 Essential (primary) hypertension: Secondary | ICD-10-CM

## 2022-07-16 DIAGNOSIS — I2510 Atherosclerotic heart disease of native coronary artery without angina pectoris: Secondary | ICD-10-CM

## 2022-07-29 ENCOUNTER — Observation Stay (HOSPITAL_COMMUNITY)
Admission: EM | Admit: 2022-07-29 | Discharge: 2022-07-31 | Disposition: A | Discharge status: Home Health | Payer: Medicare HMO | Attending: Family Medicine | Admitting: Family Medicine

## 2022-07-29 ENCOUNTER — Emergency Department (HOSPITAL_COMMUNITY): Payer: Medicare HMO

## 2022-07-29 ENCOUNTER — Encounter (HOSPITAL_COMMUNITY): Payer: Self-pay | Admitting: Emergency Medicine

## 2022-07-29 ENCOUNTER — Other Ambulatory Visit: Payer: Self-pay

## 2022-07-29 DIAGNOSIS — N1832 Chronic kidney disease, stage 3b: Secondary | ICD-10-CM | POA: Insufficient documentation

## 2022-07-29 DIAGNOSIS — I5042 Chronic combined systolic (congestive) and diastolic (congestive) heart failure: Secondary | ICD-10-CM | POA: Diagnosis not present

## 2022-07-29 DIAGNOSIS — Z8673 Personal history of transient ischemic attack (TIA), and cerebral infarction without residual deficits: Secondary | ICD-10-CM | POA: Diagnosis not present

## 2022-07-29 DIAGNOSIS — I130 Hypertensive heart and chronic kidney disease with heart failure and stage 1 through stage 4 chronic kidney disease, or unspecified chronic kidney disease: Secondary | ICD-10-CM | POA: Diagnosis not present

## 2022-07-29 DIAGNOSIS — I2510 Atherosclerotic heart disease of native coronary artery without angina pectoris: Secondary | ICD-10-CM | POA: Diagnosis not present

## 2022-07-29 DIAGNOSIS — Z7901 Long term (current) use of anticoagulants: Secondary | ICD-10-CM | POA: Insufficient documentation

## 2022-07-29 DIAGNOSIS — J449 Chronic obstructive pulmonary disease, unspecified: Secondary | ICD-10-CM | POA: Diagnosis not present

## 2022-07-29 DIAGNOSIS — Z79899 Other long term (current) drug therapy: Secondary | ICD-10-CM | POA: Insufficient documentation

## 2022-07-29 DIAGNOSIS — R079 Chest pain, unspecified: Secondary | ICD-10-CM | POA: Diagnosis present

## 2022-07-29 DIAGNOSIS — Z951 Presence of aortocoronary bypass graft: Secondary | ICD-10-CM | POA: Insufficient documentation

## 2022-07-29 DIAGNOSIS — E119 Type 2 diabetes mellitus without complications: Secondary | ICD-10-CM

## 2022-07-29 DIAGNOSIS — I442 Atrioventricular block, complete: Secondary | ICD-10-CM | POA: Diagnosis present

## 2022-07-29 DIAGNOSIS — E1122 Type 2 diabetes mellitus with diabetic chronic kidney disease: Secondary | ICD-10-CM | POA: Insufficient documentation

## 2022-07-29 DIAGNOSIS — Z950 Presence of cardiac pacemaker: Secondary | ICD-10-CM | POA: Insufficient documentation

## 2022-07-29 DIAGNOSIS — F0390 Unspecified dementia without behavioral disturbance: Secondary | ICD-10-CM | POA: Diagnosis not present

## 2022-07-29 DIAGNOSIS — I10 Essential (primary) hypertension: Secondary | ICD-10-CM | POA: Diagnosis present

## 2022-07-29 DIAGNOSIS — N179 Acute kidney failure, unspecified: Secondary | ICD-10-CM | POA: Diagnosis not present

## 2022-07-29 DIAGNOSIS — I480 Paroxysmal atrial fibrillation: Secondary | ICD-10-CM | POA: Diagnosis not present

## 2022-07-29 HISTORY — DX: Heart failure, unspecified: I50.9

## 2022-07-29 LAB — BASIC METABOLIC PANEL
Anion gap: 7 (ref 5–15)
BUN: 31 mg/dL — ABNORMAL HIGH (ref 8–23)
CO2: 24 mmol/L (ref 22–32)
Calcium: 8.4 mg/dL — ABNORMAL LOW (ref 8.9–10.3)
Chloride: 108 mmol/L (ref 98–111)
Creatinine, Ser: 2.32 mg/dL — ABNORMAL HIGH (ref 0.61–1.24)
GFR, Estimated: 26 mL/min — ABNORMAL LOW (ref >60)
Glucose, Bld: 132 mg/dL — ABNORMAL HIGH (ref 70–99)
Potassium: 4.4 mmol/L (ref 3.5–5.1)
Sodium: 139 mmol/L (ref 135–145)

## 2022-07-29 LAB — CBC
HCT: 29.6 % — ABNORMAL LOW (ref 39.0–52.0)
Hemoglobin: 9.7 g/dL — ABNORMAL LOW (ref 13.0–17.0)
MCH: 31.3 pg (ref 26.0–34.0)
MCHC: 32.8 g/dL (ref 30.0–36.0)
MCV: 95.5 fL (ref 80.0–100.0)
Platelets: 195 10*3/uL (ref 150–400)
RBC: 3.1 MIL/uL — ABNORMAL LOW (ref 4.22–5.81)
RDW: 13.6 % (ref 11.5–15.5)
WBC: 7.6 10*3/uL (ref 4.0–10.5)
nRBC: 0 % (ref 0.0–0.2)

## 2022-07-29 LAB — TROPONIN I (HIGH SENSITIVITY): Troponin I (High Sensitivity): 18 ng/L — ABNORMAL HIGH (ref <18)

## 2022-07-29 NOTE — ED Provider Triage Note (Signed)
Emergency Medicine Provider Triage Evaluation Note  Blake Burgess , a 88 y.o. male  was evaluated in triage.  Pt complains of left sided chest pain, non radiating, associated mild shortness of breath, started around 8 pm. He is on eliquis and no missed doses. He has a pacemaker. He was seen at another emergency department about one week ago for dizzy spells and was determined to be dehydrated.   Review of Systems  Positive:  Negative:   Physical Exam  BP (!) 137/59 (BP Location: Right Arm)   Pulse (!) 50   Temp 97.8 F (36.6 C) (Oral)   Resp 18   SpO2 98%  Gen:   Awake, no distress   Resp:  Normal effort  MSK:   Moves extremities without difficulty  Other:  Lungs clear, Heart RRR, pulses equal bilaterally, no LE edema, abd soft, nontender  Medical Decision Making  Medically screening exam initiated at 9:44 PM.  Appropriate orders placed.  Blake Burgess was informed that the remainder of the evaluation will be completed by another provider, this initial triage assessment does not replace that evaluation, and the importance of remaining in the ED until their evaluation is complete.     Blake Burgess, [place] 07/29/22 2145

## 2022-07-29 NOTE — ED Triage Notes (Signed)
Patient reports mid/left chest pain this evening with mild SOB , no emesis or diaphoresis , his cardiologist is Dr. Percival Spanish , history of CAD/CABG/CHF.

## 2022-07-30 ENCOUNTER — Encounter (HOSPITAL_COMMUNITY): Payer: Self-pay

## 2022-07-30 DIAGNOSIS — N179 Acute kidney failure, unspecified: Secondary | ICD-10-CM | POA: Diagnosis not present

## 2022-07-30 DIAGNOSIS — I10 Essential (primary) hypertension: Secondary | ICD-10-CM

## 2022-07-30 DIAGNOSIS — R079 Chest pain, unspecified: Secondary | ICD-10-CM | POA: Diagnosis present

## 2022-07-30 DIAGNOSIS — Z952 Presence of prosthetic heart valve: Secondary | ICD-10-CM

## 2022-07-30 DIAGNOSIS — Z950 Presence of cardiac pacemaker: Secondary | ICD-10-CM | POA: Diagnosis not present

## 2022-07-30 DIAGNOSIS — Z951 Presence of aortocoronary bypass graft: Secondary | ICD-10-CM | POA: Diagnosis not present

## 2022-07-30 DIAGNOSIS — I5042 Chronic combined systolic (congestive) and diastolic (congestive) heart failure: Secondary | ICD-10-CM

## 2022-07-30 DIAGNOSIS — I442 Atrioventricular block, complete: Secondary | ICD-10-CM | POA: Diagnosis not present

## 2022-07-30 DIAGNOSIS — E119 Type 2 diabetes mellitus without complications: Secondary | ICD-10-CM

## 2022-07-30 LAB — TROPONIN I (HIGH SENSITIVITY): Troponin I (High Sensitivity): 18 ng/L — ABNORMAL HIGH (ref <18)

## 2022-07-30 LAB — MRSA NEXT GEN BY PCR, NASAL: MRSA by PCR Next Gen: NOT DETECTED

## 2022-07-30 LAB — BASIC METABOLIC PANEL
Anion gap: 9 (ref 5–15)
BUN: 32 mg/dL — ABNORMAL HIGH (ref 8–23)
CO2: 23 mmol/L (ref 22–32)
Calcium: 8.2 mg/dL — ABNORMAL LOW (ref 8.9–10.3)
Chloride: 105 mmol/L (ref 98–111)
Creatinine, Ser: 2.13 mg/dL — ABNORMAL HIGH (ref 0.61–1.24)
GFR, Estimated: 29 mL/min — ABNORMAL LOW (ref >60)
Glucose, Bld: 117 mg/dL — ABNORMAL HIGH (ref 70–99)
Potassium: 3.9 mmol/L (ref 3.5–5.1)
Sodium: 137 mmol/L (ref 135–145)

## 2022-07-30 LAB — GLUCOSE, CAPILLARY
Glucose-Capillary: 170 mg/dL — ABNORMAL HIGH (ref 70–99)
Glucose-Capillary: 167 mg/dL — ABNORMAL HIGH (ref 70–99)

## 2022-07-30 LAB — CBG MONITORING, ED
Glucose-Capillary: 116 mg/dL — ABNORMAL HIGH (ref 70–99)
Glucose-Capillary: 114 mg/dL — ABNORMAL HIGH (ref 70–99)
Glucose-Capillary: 117 mg/dL — ABNORMAL HIGH (ref 70–99)

## 2022-07-30 MED ORDER — ALBUTEROL SULFATE (2.5 MG/3ML) 0.083% IN NEBU: 2.5000 mg | INHALATION_SOLUTION | RESPIRATORY_TRACT | Status: DC | PRN

## 2022-07-30 MED ORDER — ONDANSETRON HCL 4 MG/2ML IJ SOLN: 4.0000 mg | Freq: Four times a day (QID) | INTRAMUSCULAR | Status: DC | PRN

## 2022-07-30 MED ORDER — ISOSORBIDE MONONITRATE ER 60 MG PO TB24
60.0000 mg | ORAL_TABLET | Freq: Every day | ORAL | Status: DC
  Administered 2022-07-30 – 2022-07-31 (×2): 60 mg via ORAL
  Filled 2022-07-30: qty 1
  Filled 2022-07-30: qty 2

## 2022-07-30 MED ORDER — HYDRALAZINE HCL 10 MG PO TABS
10.0000 mg | ORAL_TABLET | Freq: Four times a day (QID) | ORAL | Status: DC
  Administered 2022-07-30 – 2022-07-31 (×3): 10 mg via ORAL
  Filled 2022-07-30 (×4): qty 1

## 2022-07-30 MED ORDER — HYDRALAZINE HCL 25 MG PO TABS: 25.0000 mg | ORAL_TABLET | Freq: Four times a day (QID) | ORAL | Status: DC

## 2022-07-30 MED ORDER — LACTATED RINGERS IV SOLN: INTRAVENOUS | Status: AC

## 2022-07-30 MED ORDER — DONEPEZIL HCL 10 MG PO TABS
10.0000 mg | ORAL_TABLET | Freq: Every day | ORAL | Status: DC
  Administered 2022-07-30: 10 mg via ORAL
  Filled 2022-07-30: qty 1

## 2022-07-30 MED ORDER — AMLODIPINE BESYLATE 5 MG PO TABS
2.5000 mg | ORAL_TABLET | Freq: Every morning | ORAL | Status: DC
  Administered 2022-07-30: 2.5 mg via ORAL
  Filled 2022-07-30: qty 1

## 2022-07-30 MED ORDER — PANTOPRAZOLE SODIUM 40 MG PO TBEC
80.0000 mg | DELAYED_RELEASE_TABLET | Freq: Every day | ORAL | Status: DC
  Administered 2022-07-30 – 2022-07-31 (×2): 80 mg via ORAL
  Filled 2022-07-30 (×2): qty 2

## 2022-07-30 MED ORDER — TAMSULOSIN HCL 0.4 MG PO CAPS
0.4000 mg | ORAL_CAPSULE | Freq: Every morning | ORAL | Status: DC
  Administered 2022-07-30 – 2022-07-31 (×2): 0.4 mg via ORAL
  Filled 2022-07-30 (×2): qty 1

## 2022-07-30 MED ORDER — METOPROLOL SUCCINATE ER 25 MG PO TB24
25.0000 mg | ORAL_TABLET | Freq: Every day | ORAL | Status: DC
  Filled 2022-07-30 (×2): qty 1

## 2022-07-30 MED ORDER — ATORVASTATIN CALCIUM 40 MG PO TABS
40.0000 mg | ORAL_TABLET | Freq: Every morning | ORAL | Status: DC
  Administered 2022-07-30 – 2022-07-31 (×2): 40 mg via ORAL
  Filled 2022-07-30 (×2): qty 1

## 2022-07-30 MED ORDER — APIXABAN 2.5 MG PO TABS
2.5000 mg | ORAL_TABLET | Freq: Two times a day (BID) | ORAL | Status: DC
  Administered 2022-07-30 – 2022-07-31 (×3): 2.5 mg via ORAL
  Filled 2022-07-30 (×3): qty 1

## 2022-07-30 MED ORDER — AMLODIPINE BESYLATE 5 MG PO TABS
5.0000 mg | ORAL_TABLET | Freq: Every morning | ORAL | Status: DC
  Administered 2022-07-30 – 2022-07-31 (×2): 5 mg via ORAL
  Filled 2022-07-30 (×2): qty 1

## 2022-07-30 MED ORDER — ACETAMINOPHEN 325 MG PO TABS: 650.0000 mg | ORAL_TABLET | ORAL | Status: DC | PRN

## 2022-07-30 NOTE — Assessment & Plan Note (Addendum)
Once med rec completed: Cont norvasc, imdur, toprol Holding enalapril due to AKI.

## 2022-07-30 NOTE — Assessment & Plan Note (Signed)
Small B pleural effusions on CXR today. Think that pt is clinically dehydrated given his AKI since starting diuretics x10 days ago.

## 2022-07-30 NOTE — Progress Notes (Signed)
PROGRESS NOTE  Brief Narrative: EULAS SCHWEITZER is an 88 y.o. male with a history of CAD s/p CABG, PAF, CHB s/p PPM, AS s/p TAVR, chronic combined HFrEF, HTN, stage IIIb CKD, T2DM, and dementia who presented to the ED with left/mid chest pain with associated shortness of breath. Troponin level was 18 > 18, ECG without acute ischemic changes, HR bradycardic which is baseline, ASVP on ECG. He appeared dehydrated on exam with creatinine and BUN elevated from baseline. Due to reports of ongoing pain he was admitted this morning by Dr. Fabio Neighbors and cardiology consulted. They are recommending observation of chest pain for recurrence without intention of ischemic evaluation acutely. Lasix and enalapril are held due to AKI.     Subjective: Seen early in the ED, still thinks it's night time, no one at bedside at time of my exam. He denies chest pain, dyspnea, or any other concerns.   When I speak to his son by phone, he confirms recent history of increased lasix dosing per PCP for a time which the patient has since gone back to his '40mg'$  in AM '20mg'$  in PM, and son thinks he's not drinking enough fluids of late. He was seen in [facility] ED, given 1L IVF and felt stable for discharge, creatinine 1.8 at that time.   Objective: BP (!) 188/53   Pulse (!) 103   Temp 98 F (36.7 C) (Oral)   Resp 17   Ht '5\' 9"'$  (1.753 m)   Wt 75.3 kg   SpO2 96%   BMI 24.51 kg/m   Gen: Elderly, pleasantly confused male, HOH, in no distress Pulm: Nonlabored at rest, no crackles.  CV: RRR, early systolic murmur at base, no JVD, no edema GI: Soft, NT, ND, +BS  Neuro: Alert. No focal deficits. Skin: Large SebK on right scalp without erythema. Nontender PPM in left chest.   Assessment & Plan: Please see H&P by Dr. Fabio Neighbors this morning for full details on the plan.   Due to continued severe range HTN/ISH, will continue norvasc, augment dosing a bit, continue imdur, add low dose hydralazine. Note pt remains on beta blocker despite  bradycardic rate with PPM in situ.  Due to AKI, ACEi and furosemide are being held. To avoid overcorrection, IVF were not given. He's been NPO for breakfast and lunch, will give gentle IVF x500cc. Recheck BMP in AM. If continuing improvement, we can discharge him in AM.  Appreciate cardiology evaluation. Pt well known to [facility], seen by Dr. Johnsie Cancel. No plans for ischemic evaluation at this time, I've ordered a diet. Troponin essentially negative for his degree of CAD. No further chest pain. Not on antiplatelet, will continue reduced dose eliquis (hx PAF remotely). Continue BB, statin.  Patrecia Pour, MD Pager on amion 07/30/2022, 12:49 PM

## 2022-07-30 NOTE — Progress Notes (Signed)
Patient refused to stay in bed with bed alarm on. Found patient standing at foot of bed, unsteady on feet, and HOH. Patient does not have hearing aids. MD called and he placed order for soft wrist restraints for his safety. Restraints applied and frequent monitoring started. Staff aware of patient safety issues. Call bell remains intact. Patient removed his primofit external cath and another one was placed on patient. Patient remains confused and doesn't know where is he. Frequent checks done on patient and reorientation started. Patient has history for dementia. Will continue to monitor patient closely.

## 2022-07-30 NOTE — Assessment & Plan Note (Signed)
Creat 2.3 today up from baseline of about 1.5-1.8. Suspect diuresis started by PCP x10 days ago as major contributor. Hold diuretics Hold ACEi Strict intake and output Daily BMP BP already on high side this AM -> will hold off on IVF for the moment.

## 2022-07-30 NOTE — H&P (Addendum)
History and Physical    Patient: Blake Burgess VOZ:[id number] DOB: 07-05-1932 DOA: 07/29/2022 DOS: the patient was seen and examined on 07/30/2022 PCP: Raelene Bott, MD  Patient coming from: Home  Chief Complaint:  Chief Complaint  Patient presents with   Chest Pain    Hx. CABG/CHF   HPI: Blake Burgess is a 88 y.o. male with medical history significant of CAD s/p CABG 2008, last LHC March 2022 showed patent grafts but 3 vessel dz.  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  CHF.  Most recent echo in June 2023 = EF 55-60% with functioning TAVR.  CHB s/p PPM.  Also HTN, HLD, COPD.  Having peripheral edema at end of Nov, PCP started him on increased lasix dose as of 07/20/22.  Pt in to ED via EMS for evaluation of CP.  Onset around 8pm.  Given ASA + NTG PTA.  Still has some CP currently.    Review of Systems: As mentioned in the history of present illness. All other systems reviewed and are negative. Past Medical History:  Diagnosis Date   Aortic stenosis    mild AS 09/2017 echo   Cancer [facility])    skin   CHF (congestive heart failure) (HCC)    COPD (chronic obstructive pulmonary disease) (HCC)    Stage 3 per patient's son   Coronary artery disease    a.  s/p CABG;   b. cath 4/12: EF 55%, 3vCAD, patent L-LAD, patent S-RCA, patent S-CFX (done after a false pos. ETT)   Dementia [facility])    Per son   Diverticular disease    GERD (gastroesophageal reflux disease)    GI bleed    Hemorrhoids    HH (hiatus hernia)    History of kidney stones    Hypertension    Osteoarthritis    Other and unspecified hyperlipidemia    Presence of permanent cardiac pacemaker    Schatzki's ring    Stroke [facility])    Past Surgical History:  Procedure Laterality Date   ARTERIOVENOUS GRAFT PLACEMENT W/ ENDOSCOPIC VEIN HARVEST     of the right leg greater spahenous vein. Surgeon: Tharon Aquas Trigt,M.D.   BACK SURGERY  2017   COLONOSCOPY  02/24/2010   Hemorrhoids, Diverticulosis. Performed at [place]. Normal  terminal ileum. Dr. June Leap, [place] ARTERY BYPASS GRAFT  06/21/2007   CABG x 3 Surgeon Ivin Poot, MD   EYE SURGERY     bilateral cataract removal   hip replace  06/09/2004   left hip Surgeon Pietro Cassis. Alvan Dame, MD   INTRAOPERATIVE TRANSTHORACIC ECHOCARDIOGRAM Left 03/03/2021   Procedure: INTRAOPERATIVE TRANSTHORACIC ECHOCARDIOGRAM;  Surgeon: Burnell Blanks, MD;  Location: [place];  Service: Open Heart Surgery;  Laterality: Left;   LAPAROSCOPIC CHOLECYSTECTOMY  2021   LEFT HEART CATH AND CORS/GRAFTS ANGIOGRAPHY N/A 11/04/2020   Procedure: LEFT HEART CATH AND CORS/GRAFTS ANGIOGRAPHY;  Surgeon: Troy Sine, MD;  Location: [place] CV LAB;  Service: Cardiovascular;  Laterality: N/A;   MULTIPLE EXTRACTIONS WITH ALVEOLOPLASTY N/A 02/03/2021   Procedure: MULTIPLE EXTRACTION WITH ALVEOLOPLASTY;  Surgeon: Charlaine Dalton, DMD;  Location: [place];  Service: Dentistry;  Laterality: N/A;   PACEMAKER IMPLANT N/A 10/03/2017   Procedure: PACEMAKER IMPLANT;  Surgeon: Evans Lance, MD;  Location: [place] CV LAB;  Service: Cardiovascular;  Laterality: N/A;   PACEMAKER LEAD REMOVAL N/A 04/30/2021   Procedure: PACEMAKER LEAD REMOVAL AND REPLACMENT;  Surgeon: Evans Lance, MD;  Location: [place];  Service: Cardiovascular;  Laterality: N/A;   REVERSE SHOULDER ARTHROPLASTY Right 08/25/2018   Procedure: REVERSE SHOULDER ARTHROPLASTY;  Surgeon: Netta Cedars, MD;  Location: [place];  Service: Orthopedics;  Laterality: Right;   TRANSCATHETER AORTIC VALVE REPLACEMENT, TRANSFEMORAL Bilateral 03/03/2021   Procedure: TRANSCATHETER AORTIC VALVE REPLACEMENT, TRANSFEMORAL;  Surgeon: Burnell Blanks, MD;  Location: [place];  Service: Open Heart Surgery;  Laterality: Bilateral;   ULTRASOUND GUIDANCE FOR VASCULAR ACCESS Bilateral 03/03/2021   Procedure: ULTRASOUND GUIDANCE FOR VASCULAR ACCESS;  Surgeon: Burnell Blanks, MD;  Location: [place];  Service: Open Heart Surgery;  Laterality:  Bilateral;   Social History:  reports that he has never smoked. He has never used smokeless tobacco. He reports that he does not drink alcohol and does not use drugs.  No Known Allergies  Family History  Problem Relation Age of Onset   Heart attack Mother    Hypertension Mother    Diabetes Father    Diabetes Brother    Diabetes Sister     Prior to Admission medications   Medication Sig Start Date End Date Taking? Authorizing Provider  acetaminophen (TYLENOL) 500 MG tablet Take 1,000 mg by mouth every 8 (eight) hours as needed for moderate pain.    [provider]  albuterol (PROVENTIL) (2.5 MG/3ML) 0.083% nebulizer solution Take 3 mLs (2.5 mg total) by nebulization every 6 (six) hours as needed for wheezing or shortness of breath. 02/11/21   Icard, Leory Plowman L, DO  amLODipine (NORVASC) 2.5 MG tablet Take 2.5 mg by mouth in the morning. 10/18/19   [provider]  amoxicillin (AMOXIL) 500 MG capsule  03/18/21   [provider]  apixaban (ELIQUIS) 2.5 MG TABS tablet Take 1 tablet (2.5 mg total) by mouth 2 (two) times daily. 04/28/22   Evans Lance, MD  atorvastatin (LIPITOR) 40 MG tablet Take 40 mg by mouth in the morning. 10/22/20   [provider]  cyanocobalamin (,VITAMIN B-12,) 1000 MCG/ML injection Inject 1,000 mcg into the muscle every 30 (thirty) days. 12/18/20   [provider]  donepezil (ARICEPT) 10 MG tablet Take 10 mg by mouth at bedtime.    [provider]  enalapril (VASOTEC) 20 MG tablet Take 20 mg by mouth 2 (two) times daily.    [provider]  Fluticasone-Umeclidin-Vilant (TRELEGY ELLIPTA) 100-62.5-25 MCG/ACT AEPB Inhale 1 puff into the lungs daily. 12/15/21   Icard, Octavio Graves, DO  furosemide (LASIX) 40 MG tablet TAKE 40 MG INA THE AM AND 20 MG IN THE PM. 01/29/22   Eileen Stanford, PA-C  isosorbide mononitrate (IMDUR) 60 MG 24 hr tablet TAKE 1 TABLET BY MOUTH EVERY DAY 11/27/21   Minus Breeding, MD  Lidocaine 4 %  PTCH Apply 1 patch topically daily as needed (pain).    [provider]  metoprolol succinate (TOPROL-XL) 25 MG 24 hr tablet Take 1 tablet (25 mg total) by mouth daily. Take with or immediately following a meal. 06/28/22   Minus Breeding, MD  nitroGLYCERIN (NITROSTAT) 0.4 MG SL tablet Place 1 tablet (0.4 mg total) under the tongue every 5 (five) minutes as needed for chest pain. 11/12/20   Kroeger, Lorelee Cover., PA-C  omeprazole (PRILOSEC) 20 MG capsule Take 20 mg by mouth in the morning. 01/19/21   [provider]  tamsulosin (FLOMAX) 0.4 MG CAPS capsule Take 0.4 mg by mouth in the morning.    [provider]    Physical Exam: Vitals:   07/29/22 2139 07/29/22 2344 07/30/22 0045  07/30/22 0049  BP: (!) 137/59 (!) 129/58 (!) 174/61   Pulse: (!) 50 (!) 58 (!) 50   Resp: '18 19 16   '$ Temp: 97.8 F (36.6 C) 97.9 F (36.6 C) 98 F (36.7 C)   TempSrc: Oral  Oral   SpO2: 98% 98% 98%   Weight:    75.3 kg  Height:    '5\' 9"'$  (1.753 m)   Constitutional: NAD, calm, comfortable Eyes: PERRL, lids and conjunctivae normal ENMT: Mucous membranes are moist. Posterior pharynx clear of any exudate or lesions.Normal dentition. Hard of hearing. Neck: normal, supple, no masses, no thyromegaly Respiratory: clear to auscultation bilaterally, no wheezing, no crackles. Normal respiratory effort. No accessory muscle use.  Cardiovascular: Regular rate and rhythm, no murmurs / rubs / gallops. No extremity edema. 2+ pedal pulses. No carotid bruits.  Abdomen: no tenderness, no masses palpated. No hepatosplenomegaly. Bowel sounds positive.  Musculoskeletal: no clubbing / cyanosis. No joint deformity upper and lower extremities. Good ROM, no contractures. Normal muscle tone.  Skin: Skin lesion of scalp that looks somewhat worrisome for malignancy to me, apparently pt has seen dermatologist for this already per patients son. Neurologic: CN 2-12 grossly intact. Sensation intact, DTR normal. Strength 5/5  in all 4.  Psychiatric: Pleasantly confused / demented, baseline per patients son.  Data Reviewed:    Trop 18 -> 18     Latest Ref Rng & Units 07/29/2022    9:50 PM 02/26/2022   11:44 AM 02/08/2022   10:32 AM  CMP  Glucose 70 - 99 mg/dL 132  100  89   BUN 8 - 23 mg/dL 31  32  37   Creatinine 0.61 - 1.24 mg/dL 2.32  1.51  1.84   Sodium 135 - 145 mmol/L 139  143  141   Potassium 3.5 - 5.1 mmol/L 4.4  4.9  4.6   Chloride 98 - 111 mmol/L 108  107  105   CO2 22 - 32 mmol/L '24  17  22   '$ Calcium 8.9 - 10.3 mg/dL 8.4  8.9  9.1    CXR: IMPRESSION: 1. Small bilateral pleural effusions.  EKG = ASVP rate 52.  Assessment and Plan: * Chest pain, rule out acute myocardial infarction With extensive cardiac history including CAD, valvular disease, as per HPI above. CP obs pathway NPO Trops flat thus far Tele monitor Message sent to P. Trent for AM cards eval. Still having CP, offered morphine but per son pt with severe confusion after getting narcotics in past, said to try tylenol instead.  AKI (acute kidney injury) ([place]) Creat 2.3 today up from baseline of about 1.5-1.8. Suspect diuresis started by PCP x10 days ago as major contributor. Hold diuretics Hold ACEi Strict intake and output Daily BMP BP already on high side this AM -> will hold off on IVF for the moment.  Type II diabetes mellitus ([place]) Med rec pending, will just check CBG Q4H for the moment.  Chronic combined systolic and diastolic heart failure (HCC) Small B pleural effusions on CXR today. Think that pt is clinically dehydrated given his AKI since starting diuretics x10 days ago.  CHB (complete heart block) (HCC) PPM in place, pacing at rate of 50, apparently PPM chronically not sensing correctly, cards aware and not much to be done about this per prior cards notes it seems.  Essential hypertension Once med rec completed: Cont norvasc, imdur, toprol Holding enalapril due to AKI.      Advance Care Planning:  Code Status: Full Code  Consults: Message sent to P. Abagail Kitchens for AM cards eval  Family Communication: Son at bedside  Severity of Illness: The appropriate patient status for this patient is OBSERVATION. Observation status is judged to be reasonable and necessary in order to provide the required intensity of service to ensure the patient's safety. The patient's presenting symptoms, physical exam findings, and initial radiographic and laboratory data in the context of their medical condition is felt to place them at decreased risk for further clinical deterioration. Furthermore, it is anticipated that the patient will be medically stable for discharge from the hospital within 2 midnights of admission.   Author: Etta Quill., DO 07/30/2022 2:57 AM  For on call review [email or web address].

## 2022-07-30 NOTE — Progress Notes (Signed)
Attempted to get out of bed multiple times claiming he is going to do a phone call. A phone call made to his son Rackley. Continue to monitor.

## 2022-07-30 NOTE — ED Notes (Signed)
Pt incontinent of urine, pt pants removed clean sheets replaced and warm blankets.

## 2022-07-30 NOTE — ED Provider Notes (Signed)
[facility] EMERGENCY DEPARTMENT Provider Note   CSN: [id number] Arrival date & time: 07/29/22  2131     History  Chief Complaint  Patient presents with   Chest Pain    Hx. CABG/CHF    Blake Burgess is a 88 y.o. male.  Patient brought to the emergency department by ambulance for evaluation of chest pain.  Pain started around 8 PM.  He does have an extensive cardiac history.  Patient was given 4 low-dose aspirin and a nitroglycerin prior to arrival.  Patient indicates that he does still have some pain currently.       Home Medications Prior to Admission medications   Medication Sig Start Date End Date Taking? Authorizing Provider  acetaminophen (TYLENOL) 500 MG tablet Take 1,000 mg by mouth every 8 (eight) hours as needed for moderate pain.    [provider]  albuterol (PROVENTIL) (2.5 MG/3ML) 0.083% nebulizer solution Take 3 mLs (2.5 mg total) by nebulization every 6 (six) hours as needed for wheezing or shortness of breath. 02/11/21   Icard, Leory Plowman L, DO  amLODipine (NORVASC) 2.5 MG tablet Take 2.5 mg by mouth in the morning. 10/18/19   [provider]  amoxicillin (AMOXIL) 500 MG capsule  03/18/21   [provider]  apixaban (ELIQUIS) 2.5 MG TABS tablet Take 1 tablet (2.5 mg total) by mouth 2 (two) times daily. 04/28/22   Evans Lance, MD  atorvastatin (LIPITOR) 40 MG tablet Take 40 mg by mouth in the morning. 10/22/20   [provider]  cyanocobalamin (,VITAMIN B-12,) 1000 MCG/ML injection Inject 1,000 mcg into the muscle every 30 (thirty) days. 12/18/20   [provider]  donepezil (ARICEPT) 10 MG tablet Take 10 mg by mouth at bedtime.    [provider]  enalapril (VASOTEC) 20 MG tablet Take 20 mg by mouth 2 (two) times daily.    [provider]  Fluticasone-Umeclidin-Vilant (TRELEGY ELLIPTA) 100-62.5-25 MCG/ACT AEPB Inhale 1 puff into the lungs daily. 12/15/21   Icard, Octavio Graves, DO  furosemide  (LASIX) 40 MG tablet TAKE 40 MG INA THE AM AND 20 MG IN THE PM. 01/29/22   Eileen Stanford, PA-C  isosorbide mononitrate (IMDUR) 60 MG 24 hr tablet TAKE 1 TABLET BY MOUTH EVERY DAY 11/27/21   Minus Breeding, MD  Lidocaine 4 % PTCH Apply 1 patch topically daily as needed (pain).    [provider]  metoprolol succinate (TOPROL-XL) 25 MG 24 hr tablet Take 1 tablet (25 mg total) by mouth daily. Take with or immediately following a meal. 06/28/22   Minus Breeding, MD  nitroGLYCERIN (NITROSTAT) 0.4 MG SL tablet Place 1 tablet (0.4 mg total) under the tongue every 5 (five) minutes as needed for chest pain. 11/12/20   Kroeger, Lorelee Cover., PA-C  omeprazole (PRILOSEC) 20 MG capsule Take 20 mg by mouth in the morning. 01/19/21   [provider]  tamsulosin (FLOMAX) 0.4 MG CAPS capsule Take 0.4 mg by mouth in the morning.    [provider]      Allergies    Patient has no known allergies.    Review of Systems   Review of Systems  Physical Exam Updated Vital Signs BP (!) 174/61   Pulse (!) 50   Temp 98 F (36.7 C) (Oral)   Resp 16   Ht '5\' 9"'$  (1.753 m)   Wt 75.3 kg   SpO2 98%   BMI 24.51 kg/m  Physical Exam Vitals and nursing note  reviewed.  Constitutional:      General: He is not in acute distress.    Appearance: He is well-developed.  HENT:     Head: Normocephalic and atraumatic.     Mouth/Throat:     Mouth: Mucous membranes are moist.  Eyes:     General: Vision grossly intact. Gaze aligned appropriately.     Extraocular Movements: Extraocular movements intact.     Conjunctiva/sclera: Conjunctivae normal.  Cardiovascular:     Rate and Rhythm: Normal rate and regular rhythm.     Pulses: Normal pulses.     Heart sounds: Normal heart sounds, S1 normal and S2 normal. No murmur heard.    No friction rub. No gallop.  Pulmonary:     Effort: Pulmonary effort is normal. No respiratory distress.     Breath sounds: Normal breath sounds.  Abdominal:     Palpations:  Abdomen is soft.     Tenderness: There is no abdominal tenderness. There is no guarding or rebound.     Hernia: No hernia is present.  Musculoskeletal:        General: No swelling.     Cervical back: Full passive range of motion without pain, normal range of motion and neck supple. No pain with movement, spinous process tenderness or muscular tenderness. Normal range of motion.     Right lower leg: No edema.     Left lower leg: No edema.  Skin:    General: Skin is warm and dry.     Capillary Refill: Capillary refill takes less than 2 seconds.     Findings: No ecchymosis, erythema, lesion or wound.  Neurological:     Mental Status: He is alert and oriented to person, place, and time.     GCS: GCS eye subscore is 4. GCS verbal subscore is 5. GCS motor subscore is 6.     Cranial Nerves: Cranial nerves 2-12 are intact.     Sensory: Sensation is intact.     Motor: Motor function is intact. No weakness or abnormal muscle tone.     Coordination: Coordination is intact.  Psychiatric:        Mood and Affect: Mood normal.        Speech: Speech normal.        Behavior: Behavior normal.     ED Results / Procedures / Treatments   Labs (all labs ordered are listed, but only abnormal results are displayed) Labs Reviewed  BASIC METABOLIC PANEL - Abnormal; Notable for the following components:      Result Value   Glucose, Bld 132 (*)    BUN 31 (*)    Creatinine, Ser 2.32 (*)    Calcium 8.4 (*)    GFR, Estimated 26 (*)    All other components within normal limits  CBC - Abnormal; Notable for the following components:   RBC 3.10 (*)    Hemoglobin 9.7 (*)    HCT 29.6 (*)    All other components within normal limits  TROPONIN I (HIGH SENSITIVITY) - Abnormal; Notable for the following components:   Troponin I (High Sensitivity) 18 (*)    All other components within normal limits  TROPONIN I (HIGH SENSITIVITY) - Abnormal; Notable for the following components:   Troponin I (High Sensitivity)  18 (*)    All other components within normal limits    EKG EKG Interpretation  Date/Time:  Thursday July 29 2022 21:45:48 EST Ventricular Rate:  52 PR Interval:  192 QRS Duration: 96 QT Interval:  512 QTC Calculation: 476 R Axis:   77 Text Interpretation: Atrial-sensed ventricular-paced rhythm Abnormal ECG When compared with ECG of 01-May-2021 04:28, PREVIOUS ECG IS PRESENT Confirmed by Orpah Greek [phone]) on 07/30/2022 2:18:36 AM  Radiology DG Chest Port 1 View  Result Date: 07/29/2022 CLINICAL DATA:  Chest pain EXAM: PORTABLE CHEST 1 VIEW COMPARISON:  05/01/2021 FINDINGS: The lungs are symmetrically well expanded. Lungs are clear. Small bilateral pleural effusions are suspected. No pneumothorax. Transcatheter aortic valve replacement and coronary artery bypass grafting has been performed. Cardiac size is within normal limits. Left subclavian dual lead pacemaker unchanged. Pulmonary vascularity is normal. Right total shoulder arthroplasty has been performed. No acute bone abnormality. IMPRESSION: 1. Small bilateral pleural effusions. Electronically Signed   By: Fidela Salisbury M.D.   On: 07/29/2022 22:15    Procedures Procedures    Medications Ordered in ED Medications - No data to display  ED Course/ Medical Decision Making/ A&P                           Medical Decision Making Amount and/or Complexity of Data Reviewed Independent Historian: caregiver External Data Reviewed: labs, radiology, ECG and notes. Labs: ordered. Decision-making details documented in ED Course. Radiology: ordered and independent interpretation performed. Decision-making details documented in ED Course. ECG/medicine tests: ordered and independent interpretation performed. Decision-making details documented in ED Course.   Presents to the emergency department for evaluation of chest pain.  Patient complained of chest pain earlier tonight, took aspirin and nitroglycerin.  This is apparently  unusual for him.  He does have a history of coronary artery disease, status post bypass 2008.  He had a heart catheterization in 2012 that showed patent bypass grafts.  Further record review reveals that he did develop aortic stenosis and underwent TAVR in 2019.  He is monitored by Dr. Percival Spanish.  Patient has a pacemaker secondary to complete heart block, pacemaker has been noted to have atrial noncapture and under sensing of the ventricle.  He is on a beta-blocker for underlying A-fib.  EKG at arrival reveals atrial sensed ventricular pacing.  No obvious ischemia or elevations.  Troponin 18 and 18.  Family tells me that he was hospitalized about a week ago and [place] secondary to syncope.  It appears from records that he was seen in the ED, diagnosed with dehydration, given IV fluids and was not admitted at that time.  He does have a history of aortic stenosis, with ongoing chest pain, recent syncope, would be concerned about recurrent aortic valve dysfunction.  Additionally, he does have multiple bypasses and is at risk for recurrent CAD.  Recommend hospitalization due to his complexity and history.  CRITICAL CARE Performed by: Orpah Greek   Total critical care time: 33 minutes  Critical care time was exclusive of separately billable procedures and treating other patients.  Critical care was necessary to treat or prevent imminent or life-threatening deterioration.  Critical care was time spent personally by me on the following activities: development of treatment plan with patient and/or surrogate as well as nursing, discussions with consultants, evaluation of patient's response to treatment, examination of patient, obtaining history from patient or surrogate, ordering and performing treatments and interventions, ordering and review of laboratory studies, ordering and review of radiographic studies, pulse oximetry and re-evaluation of patient's condition.         Final Clinical  Impression(s) / ED Diagnoses Final diagnoses:  Chest pain, unspecified type  Rx / DC Orders ED Discharge Orders     None         Jaydee Ingman, Gwenyth Allegra, MD 07/30/22 0221

## 2022-07-30 NOTE — Assessment & Plan Note (Signed)
PPM in place, pacing at rate of 50, apparently PPM chronically not sensing correctly, cards aware and not much to be done about this per prior cards notes it seems.

## 2022-07-30 NOTE — Assessment & Plan Note (Addendum)
With extensive cardiac history including CAD, valvular disease, as per HPI above. CP obs pathway NPO Trops flat thus far Tele monitor Message sent to P. Trent for AM cards eval. Still having CP, offered morphine but per son pt with severe confusion after getting narcotics in past, said to try tylenol instead.

## 2022-07-30 NOTE — Consult Note (Signed)
CARDIOLOGY CONSULT NOTE       Patient ID: Blake Burgess MRN: [id number] DOB/AGE: June 19, 1932 88 y.o.  Admit date: 07/29/2022 Referring Physician: Alcario Drought Primary Physician: Raelene Bott, MD Primary Cardiologist: Hochrein Reason for Consultation: Chest pain  Principal Problem:   Chest pain, rule out acute myocardial infarction Active Problems:   Essential hypertension   CHB (complete heart block) (HCC)   Chronic combined systolic and diastolic heart failure (HCC)   Type II diabetes mellitus ([place])   AKI (acute kidney injury) ([place])   HPI:  88 y.o. seen at the request of Dr Alcario Drought Of note on my evaluation in ER patient confused and denies any chest pain. He has a long cardiac history with CABG in 2008. All grafts  ( SVG RCA SVG OM, LIMA to LAD ) patent by cath March 2022 prior to TAVR. He had successful TAVR for severe AS 03/03/21 with a 26 mm Sapien 3 valve. Post procedure TTE 01/29/22 showed normal function of valve with mean gradient 10 mmhg and no PVL. History of CHB with Medtronic PPM he has had some issues with non capture of atrium and undersensing in ventricle but Dr Lovena Le did not think additional lead revision needed In ER he is AV pacing with rates in the 50's  ECG in ER shows AV pacing rate 52 with relatively normal QRS and ST segments His troponins are negative being 18 x 2  His CXR shows small bilateral effusions Sats ok and he also has a history of COPD.   Labs show azotemia with BUN 32 Cr 2.13  K 3.9 Hct 29.6   ROS All other systems reviewed and negative except as noted above  Past Medical History:  Diagnosis Date   Aortic stenosis    mild AS 09/2017 echo   Cancer (HCC)    skin   CHF (congestive heart failure) (HCC)    COPD (chronic obstructive pulmonary disease) ([place])    Stage 3 per patient's son   Coronary artery disease    a.  s/p CABG;   b. cath 4/12: EF 55%, 3vCAD, patent L-LAD, patent S-RCA, patent S-CFX (done after a false pos. ETT)   Dementia ([place])    Per  son   Diverticular disease    GERD (gastroesophageal reflux disease)    GI bleed    Hemorrhoids    HH (hiatus hernia)    History of kidney stones    Hypertension    Osteoarthritis    Other and unspecified hyperlipidemia    Presence of permanent cardiac pacemaker    Schatzki's ring    Stroke [facility])     Family History  Problem Relation Age of Onset   Heart attack Mother    Hypertension Mother    Diabetes Father    Diabetes Brother    Diabetes Sister     Social History   Socioeconomic History   Marital status: Married    Spouse name: Not on file   Number of children: 2   Years of education: Not on file   Highest education level: Not on file  Occupational History   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 Daily Caffeine: 24 oz pepsi and 1 cup coffee.    Social Determinants of Health   Financial Resource Strain: Not on file  Food Insecurity: Not on file  Transportation Needs: Not on file  Physical Activity: Not on file  Stress: Not on file  Social Connections: Not on file  Intimate Partner Violence: Not on file    Past Surgical History:  Procedure Laterality Date   ARTERIOVENOUS GRAFT PLACEMENT W/ ENDOSCOPIC VEIN HARVEST     of the right leg greater spahenous vein. Surgeon: Tharon Aquas Trigt,M.D.   BACK SURGERY  2017   COLONOSCOPY  02/24/2010   Hemorrhoids, Diverticulosis. Performed at [place]. Normal terminal ileum. Dr. June Leap, [place] ARTERY BYPASS GRAFT  06/21/2007   CABG x 3 Surgeon Ivin Poot, MD   EYE SURGERY     bilateral cataract removal   hip replace  06/09/2004   left hip Surgeon Pietro Cassis. Alvan Dame, MD   INTRAOPERATIVE TRANSTHORACIC ECHOCARDIOGRAM  Left 03/03/2021   Procedure: INTRAOPERATIVE TRANSTHORACIC ECHOCARDIOGRAM;  Surgeon: Burnell Blanks, MD;  Location: [place];  Service: Open Heart Surgery;  Laterality: Left;   LAPAROSCOPIC CHOLECYSTECTOMY  2021   LEFT HEART CATH AND CORS/GRAFTS ANGIOGRAPHY N/A 11/04/2020   Procedure: LEFT HEART CATH AND CORS/GRAFTS ANGIOGRAPHY;  Surgeon: Troy Sine, MD;  Location: [place] CV LAB;  Service: Cardiovascular;  Laterality: N/A;   MULTIPLE EXTRACTIONS WITH ALVEOLOPLASTY N/A 02/03/2021   Procedure: MULTIPLE EXTRACTION WITH ALVEOLOPLASTY;  Surgeon: Charlaine Dalton, DMD;  Location: [place];  Service: Dentistry;  Laterality: N/A;   PACEMAKER IMPLANT N/A 10/03/2017   Procedure: PACEMAKER IMPLANT;  Surgeon: Evans Lance, MD;  Location: [place] CV LAB;  Service: Cardiovascular;  Laterality: N/A;   PACEMAKER LEAD REMOVAL N/A 04/30/2021   Procedure: PACEMAKER LEAD REMOVAL AND REPLACMENT;  Surgeon: Evans Lance, MD;  Location: [place];  Service: Cardiovascular;  Laterality: N/A;   REVERSE SHOULDER ARTHROPLASTY Right 08/25/2018   Procedure: REVERSE SHOULDER ARTHROPLASTY;  Surgeon: Netta Cedars, MD;  Location: [place];  Service: Orthopedics;  Laterality: Right;   TRANSCATHETER AORTIC VALVE REPLACEMENT, TRANSFEMORAL Bilateral 03/03/2021   Procedure: TRANSCATHETER AORTIC VALVE REPLACEMENT, TRANSFEMORAL;  Surgeon: Burnell Blanks, MD;  Location: [place];  Service: Open Heart Surgery;  Laterality: Bilateral;   ULTRASOUND GUIDANCE FOR VASCULAR ACCESS Bilateral 03/03/2021   Procedure: ULTRASOUND GUIDANCE FOR VASCULAR ACCESS;  Surgeon: Burnell Blanks, MD;  Location: [place];  Service: Open Heart Surgery;  Laterality: Bilateral;      Current Facility-Administered Medications:    acetaminophen (TYLENOL) tablet 650 mg, 650 mg, Oral, Q4H PRN, Alcario Drought, Jared M, DO   amLODipine (NORVASC) tablet 2.5 mg, 2.5 mg, Oral, q AM, Alcario Drought, Jared M, DO   apixaban [facility] [place]) tablet 2.5 mg, 2.5 mg, Oral,  BID, Alcario Drought, Jared M, DO   atorvastatin (LIPITOR) tablet 40 mg, 40 mg, Oral, q AM, Alcario Drought, Jared M, DO   donepezil (ARICEPT) tablet 10 mg, 10 mg, Oral, QHS, Alcario Drought, Jared M, DO   isosorbide mononitrate (IMDUR) 24 hr tablet 60 mg, 60 mg, Oral, Daily, Alcario Drought, Jared M, DO   metoprolol succinate (TOPROL-XL) 24 hr tablet 25 mg, 25 mg, Oral, Daily, Alcario Drought, Jared M, DO   ondansetron [facility]) injection 4 mg, 4 mg, Intravenous, Q6H PRN, Alcario Drought, Jared M, DO   pantoprazole (PROTONIX) EC tablet 80 mg, 80 mg, Oral, Daily, Alcario Drought, Jared M, DO   tamsulosin (FLOMAX) capsule 0.4 mg, 0.4 mg, Oral, q AM, Etta Quill,  DO  Current Outpatient Medications:    ACETAMINOPHEN PO, Take 650 mg by mouth in the morning and at bedtime., Disp: , Rfl:    amLODipine (NORVASC) 2.5 MG tablet, Take 2.5 mg by mouth in the morning., Disp: , Rfl:    apixaban (ELIQUIS) 2.5 MG TABS tablet, Take 1 tablet (2.5 mg total) by mouth 2 (two) times daily., Disp: 60 tablet, Rfl: 3   atorvastatin (LIPITOR) 40 MG tablet, Take 40 mg by mouth in the morning., Disp: , Rfl:    cyanocobalamin (VITAMIN B12) 1000 MCG tablet, Take 1,000 mcg by mouth daily., Disp: , Rfl:    donepezil (ARICEPT) 10 MG tablet, Take 10 mg by mouth at bedtime., Disp: , Rfl:    enalapril (VASOTEC) 20 MG tablet, Take 20 mg by mouth 2 (two) times daily., Disp: , Rfl:    furosemide (LASIX) 40 MG tablet, TAKE 40 MG INA THE AM AND 20 MG IN THE PM., Disp: 135 tablet, Rfl: 3   isosorbide mononitrate (IMDUR) 60 MG 24 hr tablet, TAKE 1 TABLET BY MOUTH EVERY DAY, Disp: 90 tablet, Rfl: 3   Lidocaine 4 % PTCH, Apply 1 patch topically daily as needed (pain)., Disp: , Rfl:    metoprolol succinate (TOPROL-XL) 25 MG 24 hr tablet, Take 1 tablet (25 mg total) by mouth daily. Take with or immediately following a meal., Disp: 90 tablet, Rfl: 1   nitroGLYCERIN (NITROSTAT) 0.4 MG SL tablet, Place 1 tablet (0.4 mg total) under the tongue every 5 (five) minutes as needed for chest pain.,  Disp: 25 tablet, Rfl: 3   omeprazole (PRILOSEC) 20 MG capsule, Take 20 mg by mouth in the morning., Disp: , Rfl:    tamsulosin (FLOMAX) 0.4 MG CAPS capsule, Take 0.4 mg by mouth in the morning., Disp: , Rfl:    albuterol (PROVENTIL) (2.5 MG/3ML) 0.083% nebulizer solution, Take 3 mLs (2.5 mg total) by nebulization every 6 (six) hours as needed for wheezing or shortness of breath. (Patient not taking: Reported on 07/30/2022), Disp: 75 mL, Rfl: 12   Fluticasone-Umeclidin-Vilant (TRELEGY ELLIPTA) 100-62.5-25 MCG/ACT AEPB, Inhale 1 puff into the lungs daily. (Patient not taking: Reported on 07/30/2022), Disp: 60 each, Rfl: 0  amLODipine  2.5 mg Oral q AM   apixaban  2.5 mg Oral BID   atorvastatin  40 mg Oral q AM   donepezil  10 mg Oral QHS   isosorbide mononitrate  60 mg Oral Daily   metoprolol succinate  25 mg Oral Daily   pantoprazole  80 mg Oral Daily   tamsulosin  0.4 mg Oral q AM     Physical Exam: BP (!) 188/53   Pulse (!) 50   Temp 98 F (36.7 C) (Oral)   Resp 15   Ht '5\' 9"'$  (1.753 m)   Wt 75.3 kg   SpO2 96%   BMI 24.51 kg/m   Confused elderly pale male PPM low in left chest Mild rhonchi bilateral lungs  SEM through TAVR valve no AR Abdomen benign No edema   Labs:   Lab Results  Component Value Date   WBC 7.6 07/29/2022   HGB 9.7 (L) 07/29/2022   HCT 29.6 (L) 07/29/2022   MCV 95.5 07/29/2022   PLT 195 07/29/2022    Recent Labs  Lab 07/30/22 0526  NA 137  K 3.9  CL 105  CO2 23  BUN 32*  CREATININE 2.13*  CALCIUM 8.2*  GLUCOSE 117*   Lab Results  Component Value Date   CKTOTAL 111  05/22/2018   CKMB 2.2 06/17/2007   TROPONINI <0.03 10/01/2017    Lab Results  Component Value Date   CHOL 112 10/01/2017   CHOL 176 09/11/2010   CHOL 105 02/05/2009   Lab Results  Component Value Date   HDL 39 (L) 10/01/2017   HDL 46.70 09/11/2010   HDL 43.70 02/05/2009   Lab Results  Component Value Date   LDLCALC 59 10/01/2017   LDLCALC 106 (H) 09/11/2010    LDLCALC 53 02/05/2009   Lab Results  Component Value Date   TRIG 68 10/01/2017   TRIG 116.0 09/11/2010   TRIG 44.0 02/05/2009   Lab Results  Component Value Date   CHOLHDL 2.9 10/01/2017   CHOLHDL 4 09/11/2010   CHOLHDL 2 02/05/2009   No results found for: "LDLDIRECT"    Radiology: [facility] Chest Port 1 View  Result Date: 07/29/2022 CLINICAL DATA:  Chest pain EXAM: PORTABLE CHEST 1 VIEW COMPARISON:  05/01/2021 FINDINGS: The lungs are symmetrically well expanded. Lungs are clear. Small bilateral pleural effusions are suspected. No pneumothorax. Transcatheter aortic valve replacement and coronary artery bypass grafting has been performed. Cardiac size is within normal limits. Left subclavian dual lead pacemaker unchanged. Pulmonary vascularity is normal. Right total shoulder arthroplasty has been performed. No acute bone abnormality. IMPRESSION: 1. Small bilateral pleural effusions. Electronically Signed   By: Fidela Salisbury M.D.   On: 07/29/2022 22:15   CUP PACEART REMOTE DEVICE CHECK  Result Date: 07/14/2022 Scheduled remote reviewed. Normal device function.  Known elevated RV threshold on remotes.  (0.75V @ 0.90m 6/19 ov) Next remote 91 days. LA   EKG: see HPI   ASSESSMENT AND PLAN:   Chest Pain:  patient has no complaints of such on my interview as witnessed by nurse He seems more confused and has history of dementia. Troponin negative no acute ECG changes and grafts patent on cath March 2022 prior to TAVR Would observe for now No indication for ischemic evaluation TAVR:  normal function by TTE 01/29/22 no AR murmur stable PPM:  some issues with atrial capture and ventricular undersensing Per Dr TLovena Leno more revision of leads needed Medtronic device Pacing at lower rate Continue telemetry  Dementia:  ? Further w/u of confusion r/o infection check UA on Aricept  PAF:  distant in sinus on low dose eliquis given age and renal function  COPD:  abnormal lung exam CXR small bilateral  effusions consider nebs  Signed: PJenkins Rouge12/03/2022, 9:57 AM

## 2022-07-30 NOTE — Assessment & Plan Note (Signed)
Med rec pending, will just check CBG Q4H for the moment.

## 2022-07-31 DIAGNOSIS — I5042 Chronic combined systolic (congestive) and diastolic (congestive) heart failure: Secondary | ICD-10-CM | POA: Diagnosis not present

## 2022-07-31 DIAGNOSIS — I442 Atrioventricular block, complete: Secondary | ICD-10-CM | POA: Diagnosis not present

## 2022-07-31 DIAGNOSIS — R079 Chest pain, unspecified: Secondary | ICD-10-CM | POA: Diagnosis not present

## 2022-07-31 LAB — URINALYSIS, ROUTINE W REFLEX MICROSCOPIC
Bilirubin Urine: NEGATIVE
Glucose, UA: NEGATIVE mg/dL
Ketones, ur: 5 mg/dL — AB
Leukocytes,Ua: NEGATIVE
Nitrite: NEGATIVE
Protein, ur: NEGATIVE mg/dL
Specific Gravity, Urine: 1.017 (ref 1.005–1.030)
pH: 5 (ref 5.0–8.0)

## 2022-07-31 LAB — GLUCOSE, CAPILLARY
Glucose-Capillary: 127 mg/dL — ABNORMAL HIGH (ref 70–99)
Glucose-Capillary: 145 mg/dL — ABNORMAL HIGH (ref 70–99)

## 2022-07-31 LAB — BASIC METABOLIC PANEL
Anion gap: 10 (ref 5–15)
BUN: 29 mg/dL — ABNORMAL HIGH (ref 8–23)
CO2: 23 mmol/L (ref 22–32)
Calcium: 8.6 mg/dL — ABNORMAL LOW (ref 8.9–10.3)
Chloride: 106 mmol/L (ref 98–111)
Creatinine, Ser: 1.91 mg/dL — ABNORMAL HIGH (ref 0.61–1.24)
GFR, Estimated: 33 mL/min — ABNORMAL LOW (ref >60)
Glucose, Bld: 132 mg/dL — ABNORMAL HIGH (ref 70–99)
Potassium: 4.3 mmol/L (ref 3.5–5.1)
Sodium: 139 mmol/L (ref 135–145)

## 2022-07-31 MED ORDER — AMLODIPINE BESYLATE 5 MG PO TABS: 2.5000 mg | ORAL_TABLET | Freq: Every morning | ORAL | 0 refills | Status: DC

## 2022-07-31 NOTE — Care Management Obs Status (Signed)
[facility] NOTIFICATION   Patient Details  Name: CAMPBELL KRAY MRN: [id number] Date of Birth: 1932/02/06   Medicare Observation Status Notification Given:  Yes    Avelyn Touch G., RN 07/31/2022, 8:39 AM

## 2022-07-31 NOTE — Evaluation (Signed)
Clinical/Bedside Swallow Evaluation Patient Details  Name: Blake Burgess MRN: [id number] Date of Birth: [birth year]  Today's Date: 07/31/2022 Time: SLP Start Time (ACUTE ONLY): 44 SLP Stop Time (ACUTE ONLY): 1129 SLP Time Calculation (min) (ACUTE ONLY): 24 min  Past Medical History:  Past Medical History:  Diagnosis Date   Aortic stenosis    mild AS 09/2017 echo   Cancer ([place])    skin   CHF (congestive heart failure) (HCC)    COPD (chronic obstructive pulmonary disease) ([place])    Stage 3 per patient's son   Coronary artery disease    a.  s/p CABG;   b. cath 4/12: EF 55%, 3vCAD, patent L-LAD, patent S-RCA, patent S-CFX (done after a false pos. ETT)   Dementia [facility])    Per son   Diverticular disease    GERD (gastroesophageal reflux disease)    GI bleed    Hemorrhoids    HH (hiatus hernia)    History of kidney stones    Hypertension    Osteoarthritis    Other and unspecified hyperlipidemia    Presence of permanent cardiac pacemaker    Schatzki's ring    Stroke [facility])    Past Surgical History:  Past Surgical History:  Procedure Laterality Date   ARTERIOVENOUS GRAFT PLACEMENT W/ ENDOSCOPIC VEIN HARVEST     of the right leg greater spahenous vein. Surgeon: Tharon Aquas Trigt,M.D.   BACK SURGERY  2017   COLONOSCOPY  02/24/2010   Hemorrhoids, Diverticulosis. Performed at [place]. Normal terminal ileum. Dr. June Leap, [place] ARTERY BYPASS GRAFT  06/21/2007   CABG x 3 Surgeon Ivin Poot, MD   EYE SURGERY     bilateral cataract removal   hip replace  06/09/2004   left hip Surgeon Pietro Cassis. Alvan Dame, MD   INTRAOPERATIVE TRANSTHORACIC ECHOCARDIOGRAM Left 03/03/2021   Procedure: INTRAOPERATIVE TRANSTHORACIC ECHOCARDIOGRAM;  Surgeon: Burnell Blanks, MD;  Location: [place];  Service: Open Heart Surgery;  Laterality: Left;   LAPAROSCOPIC CHOLECYSTECTOMY  2021   LEFT HEART CATH AND CORS/GRAFTS ANGIOGRAPHY N/A 11/04/2020   Procedure: LEFT HEART CATH AND CORS/GRAFTS  ANGIOGRAPHY;  Surgeon: Troy Sine, MD;  Location: [place] CV LAB;  Service: Cardiovascular;  Laterality: N/A;   MULTIPLE EXTRACTIONS WITH ALVEOLOPLASTY N/A 02/03/2021   Procedure: MULTIPLE EXTRACTION WITH ALVEOLOPLASTY;  Surgeon: Charlaine Dalton, DMD;  Location: [place];  Service: Dentistry;  Laterality: N/A;   PACEMAKER IMPLANT N/A 10/03/2017   Procedure: PACEMAKER IMPLANT;  Surgeon: Evans Lance, MD;  Location: [place] CV LAB;  Service: Cardiovascular;  Laterality: N/A;   PACEMAKER LEAD REMOVAL N/A 04/30/2021   Procedure: PACEMAKER LEAD REMOVAL AND REPLACMENT;  Surgeon: Evans Lance, MD;  Location: [place];  Service: Cardiovascular;  Laterality: N/A;   REVERSE SHOULDER ARTHROPLASTY Right 08/25/2018   Procedure: REVERSE SHOULDER ARTHROPLASTY;  Surgeon: Netta Cedars, MD;  Location: [place];  Service: Orthopedics;  Laterality: Right;   TRANSCATHETER AORTIC VALVE REPLACEMENT, TRANSFEMORAL Bilateral 03/03/2021   Procedure: TRANSCATHETER AORTIC VALVE REPLACEMENT, TRANSFEMORAL;  Surgeon: Burnell Blanks, MD;  Location: [place];  Service: Open Heart Surgery;  Laterality: Bilateral;   ULTRASOUND GUIDANCE FOR VASCULAR ACCESS Bilateral 03/03/2021   Procedure: ULTRASOUND GUIDANCE FOR VASCULAR ACCESS;  Surgeon: Burnell Blanks, MD;  Location: [place];  Service: Open Heart Surgery;  Laterality: Bilateral;   HPI:  Blake Burgess is a 88 y.o. male with medical history significant of CAD s/p CABG 2008, last LHC March 2022 showed patent  grafts but 3 vessel dz.  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  CHF.  Most recent echo in June 2023 = EF 55-60% with functioning TAVR.  CHB s/p PPM. Pt with swallowing difficulty with PO medicines and increased coughing with thin liquids per staff report this admit prompting SLP swallowing consult.    Assessment / Plan / Recommendation  Clinical Impression  Pt with dypshagia this admission as nursing reports difficulty with PO medicines and coughing with thin liquids.  Oral motor exam notable for missing dentition in poor condition and xersotmomia; improving post oral care by SLP. Pt with frequent isolated coughing and throat clearing following thin liquids by cup and straw concerning for reduced airway protection. Pts son does report pt with hx of chronic cough but pts cough appeared to be triggered during PO trials with SLP. Trialed nectar thick liquids and no overt s/sx of aspiration were exhibited. Nurse present and administered PO meds crushed in puree (small one; whole in puree) without pt difficulty. Recommend dysphagia 3 (mechanical soft) and nectar thick liquids with meds in puree (crush meds as needed). SLP called pts son, [name] and educated upon recommendations and strategies to maximize swallow safety and efficiency. Discussed that patient has not had hx of PNA and strategies to reduce risk for PNA including frequent oral care, safe swallowing strategies. Pt with planned DC this date; recommend [facility] SLP follow up to manage dysphagia. Pt may benefit from future outpatient swallowing study if difficulties with swallowing persist at home.   SLP Visit Diagnosis: Dysphagia, oral phase (R13.11);Dysphagia, unspecified (R13.10)    Aspiration Risk  Mild aspiration risk;Moderate aspiration risk    Diet Recommendation   Dysphagia 3 (mechanical soft), nectar thick liquids   Medication Administration: Crushed with puree    Other  Recommendations Oral Care Recommendations: Oral care BID Other Recommendations: Order thickener from pharmacy    Recommendations for follow up therapy are one component of a multi-disciplinary discharge planning process, led by the attending physician.  Recommendations may be updated based on patient status, additional functional criteria and insurance authorization.  Follow up Recommendations Home health SLP      Assistance Recommended at Discharge    Functional Status Assessment Patient has had a recent decline in their functional  status and demonstrates the ability to make significant improvements in function in a reasonable and predictable amount of time.  Frequency and Duration min 2x/week  2 weeks       Prognosis Prognosis for Safe Diet Advancement: Good Barriers to Reach Goals: Cognitive deficits      Swallow Study   General Date of Onset: 07/30/22 HPI: KRESTON AHRENDT is a 88 y.o. male with medical history significant of CAD s/p CABG 2008, last LHC March 2022 showed patent grafts but 3 vessel dz.  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  CHF.  Most recent echo in June 2023 = EF 55-60% with functioning TAVR.  CHB s/p PPM. Pt with swallowing difficulty with PO medicines and increased coughing with thin liquids per staff report this admit prompting SLP swallowing consult. Type of Study: Bedside Swallow Evaluation Previous Swallow Assessment: none on file Diet Prior to this Study: NPO Temperature Spikes Noted: No Respiratory Status: Room air History of Recent Intubation: No Behavior/Cognition: Alert;Cooperative (hx of dementia) Oral Cavity Assessment: Dry Oral Care Completed by SLP: Yes Oral Cavity - Dentition: Missing dentition;Poor condition Vision: Functional for self-feeding Self-Feeding Abilities: Needs assist Patient Positioning: Upright in bed Baseline Vocal Quality: Normal Volitional Cough: Congested Volitional  Swallow: Able to elicit    Oral/Motor/Sensory Function Overall Oral Motor/Sensory Function: Generalized oral weakness   Ice Chips Ice chips: Within functional limits Presentation: Spoon   Thin Liquid Thin Liquid: Impaired Presentation: Cup;Straw Oral Phase Functional Implications: Prolonged oral transit Pharyngeal  Phase Impairments: Suspected delayed Swallow;Multiple swallows;Throat Clearing - Delayed;Cough - Delayed    Nectar Thick Nectar Thick Liquid: Impaired Presentation: Cup Pharyngeal Phase Impairments: Suspected delayed Swallow;Multiple swallows   Honey Thick Honey Thick Liquid: Not tested    Puree Puree: Within functional limits   Solid     Solid: Impaired Presentation: Self Fed Oral Phase Functional Implications: Prolonged oral transit Pharyngeal Phase Impairments: Suspected delayed Swallow;Multiple swallows      Malyk Girouard H. MA, CCC-SLP Acute Rehabilitation Services   07/31/2022,1:00 PM

## 2022-07-31 NOTE — TOC Initial Note (Signed)
Transition of Care [facility]) - Initial/Assessment Note    Patient Details  Name: Blake Burgess MRN: [id number] Date of Birth: [birth year]  Transition of Care [facility]) CM/SW Contact:    Blake Burgess., RN Phone Number: 07/31/2022, 1:35 PM  Clinical Narrative:    I: Blake Burgess is a 88 y.o. male with medical history significant of CAD s/p CABG 2008, last LHC March 2022 showed patent grafts but 3 vessel dz.  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  CHF.  Most recent echo in June 2023 = EF 55-60% with functioning TAVR.  CHB s/p PPM.   Also HTN, HLD, COPD.  RNCM received HH orders for HHPT and SLP.  RNCM spoke to patient's son and offered choice.  Patient's son stated they would like to use [facility] as they have used them before and liked them.  RNCM called [facility] and spoke to answering service.  RNCM left name and number for return call...have not received return call.  RNCM spoke to patient's son to update him and he does not want to choose another [facility] agency-wants to use [facility].  Patient's son states he will call  Blake Burgess himself if they do not call back and he will arrange [facility] services for his Father in collaboration with his PCP.               Expected Discharge Plan: [place] Barriers to Discharge: No Barriers Identified  Patient Goals and CMS Choice Patient states their goals for this hospitalization and ongoing recovery are:: return to his home CMS Medicare.gov Compare Post Acute Care list provided to:: Patient Represenative (must comment) (son) Choice offered to / list presented to : Adult Children  Expected Discharge Plan and Services Expected Discharge Plan: [place]   Discharge Planning Services: CM Consult Post Acute Care Choice: [place] arrangements for the past 2 months: Single Family Home Expected Discharge Date: 07/31/22               DME Arranged: N/A DME Agency: NA       HH Arranged: PT, Speech Therapy HH Agency: [place] Date [place]: 07/31/22 Time [place]: 1829 Representative spoke with at [place]: spoke to answering service  Prior Living Arrangements/Services Living arrangements for the past 2 months: [place] with:: Self   Do you feel safe going back to the place where you live?: Yes      Need for Family Participation in Patient Care: Yes (Comment) Care giver support system in place?: Yes (comment)   Criminal Activity/Legal Involvement Pertinent to Current Situation/Hospitalization: No - Comment as needed  Activities of Daily Living Home Assistive Devices/Equipment: Walker (specify type), Cane (specify quad or straight) ADL Screening (condition at time of admission) Patient's cognitive ability adequate to safely complete daily activities?: No Is the patient deaf or have difficulty hearing?: Yes Does the patient have difficulty seeing, even when wearing glasses/contacts?: No Does the patient have difficulty concentrating, remembering, or making decisions?: Yes Patient able to express need for assistance with ADLs?: Yes Does the patient have difficulty dressing or bathing?: Yes Independently performs ADLs?: No Communication: Independent Dressing (OT): Needs assistance Is this a change from baseline?: Pre-admission baseline Grooming: Needs assistance Is this a change from baseline?: Pre-admission baseline Feeding: Independent Bathing: Needs assistance Is this a change from baseline?: Pre-admission baseline Toileting: Needs assistance Is this a change from baseline?: Pre-admission baseline In/Out Bed: Needs assistance  Is this a change from baseline?: Pre-admission baseline Walks in Home: Independent with device (comment) Does the patient have difficulty walking or climbing stairs?: Yes Weakness of Legs: Right Weakness of Arms/Hands: Left  Permission Sought/Granted                 Emotional Assessment Appearance:: Appears stated  age Attitude/Demeanor/Rapport: Gracious Affect (typically observed): Accepting     Psych Involvement: No (comment)  Admission diagnosis:  Chest pain, rule out acute myocardial infarction [R07.9] Chest pain, unspecified type [R07.9] Patient Active Problem List   Diagnosis Date Noted   Chest pain, rule out acute myocardial infarction 07/30/2022   AKI (acute kidney injury) ([place]) 07/30/2022   PAD (peripheral artery disease) ([place]) 02/09/2022   PAF (paroxysmal atrial fibrillation) ([place]) 07/23/2021   Pacemaker lead failure, subsequent encounter 04/30/2021   Malfunction of electrode lead of cardiac pacemaker 04/30/2021   S/P TAVR (transcatheter aortic valve replacement) 03/03/2021   [place]    Chronic apical periodontitis    Retained dental root    Chronic periodontitis    Persistent atrial fibrillation ([place]) 11/19/2020   Precordial chest pain 11/19/2020   Pacemaker 11/06/2019   Educated about COVID-19 virus infection 12/12/2018   Aortic stenosis, severe 12/12/2018   Actinic skin damage 10/13/2018   Back pain 10/13/2018   Cancer ([place]) 10/13/2018   Diverticulosis 10/13/2018   Spinal stenosis 10/13/2018   Hearing deficit 10/13/2018   Complete atrioventricular block ([place]) 10/13/2018   Dyspnea 10/13/2018   S/P shoulder replacement, right 08/25/2018   History of artificial joint 08/25/2018   Preop cardiovascular exam 07/13/2018   Coronary artery disease involving native coronary artery of native heart without angina pectoris 06/05/2018   Chronic combined systolic and diastolic heart failure ([place]) 06/05/2018   Dyslipidemia 06/05/2018   Pain in joint of right shoulder 05/09/2018   Osteoarthritis of right glenohumeral joint 05/09/2018   ................................................................................... 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   Cardiac pacemaker in situ 95/04/3266   Acute diastolic (congestive) heart  failure ([place]) 12/45/8099   Acute diastolic heart failure ([place]) 10/11/2017   CHB (complete heart block) (H[facility])    SOB (shortness of breath)    Dyslipidemia, goal LDL below 70    Risk for falls 12/21/2016   Parent-foster child problem 11/25/2016   Lumbar stenosis with neurogenic claudication 08/04/2016   Lightheadedness 02/03/2016   Right ear pain 03/22/2013   Shingles 03/22/2013   Other specified cardiac arrhythmias 02/19/2013   Old myocardial infarction 02/19/2013   History of total hip replacement 02/19/2013   History of TIA (transient ischemic attack) 02/19/2013   Type II diabetes mellitus ([place]) 02/19/2013   Generalized ischemic cerebrovascular disease 02/19/2013   Heart disease 02/19/2013   Status post aorto-coronary artery bypass graft 02/19/2013   Anemia 02/18/2013   B12 deficiency 02/18/2013   Kidney stones 02/18/2013   GERD (gastroesophageal reflux disease) 02/18/2013   Esophageal stricture 02/18/2013   Stroke ([place]) 02/18/2013   DJD (degenerative joint disease) 02/18/2013   Hyperlipidemia 02/18/2013   Arthritis 01/10/2013   Angina pectoris ([place]) 12/14/2010   Malaise and fatigue 10/19/2010   COUGH 08/07/2010   ANEMIA, SECONDARY TO ACUTE BLOOD LOSS 03/03/2010   GI BLEED 03/03/2010   Osteoarthritis 03/03/2010   Gastrointestinal hemorrhage, unspecified 03/03/2010   Atherosclerotic heart disease of native coronary artery without angina pectoris 02/23/2010   DIZZINESS 12/04/2009   Essential hypertension 02/05/2009   Hx of CABG-2008 02/05/2009   History of coronary artery bypass  graft 02/05/2009   H/O acute myocardial infarction 08/23/2006   PCP:  Raelene Bott, MD Pharmacy:   [id number] [place] [place] [address]Phone: [phone]: [phone] Social Determinants of Health (SDOH) Interventions   Readmission Risk Interventions     No data to display

## 2022-07-31 NOTE — Evaluation (Signed)
Physical Therapy Evaluation Patient Details Name: Blake Burgess MRN: [id number] DOB: 01/17/1932 Today's Date: 07/31/2022  History of Present Illness  Pt is an 88 y.o. male who presented 07/29/22 with CP. Troponin negative and no acute ECG changes. 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 PTA, he was mod I for functional mobility using his cane, but often forgetting it throughout the house. Pt lives with his wife and his son lives across the street and is present often to assist in caring for the pt due to his dementia. The pt stays on the main level of his house with 1 small STE. Currently, pt is disoriented to location but following simple commands. He is requiring min guard assist for safety with transfers and when ambulating with a RW and up to minA for bed mobility and when ambulating with a cane. He demonstrates deficits in cognition, balance, activity tolerance, and strength and is at high risk for falls. Educated pt's son on his high risk for falls and recommendation for close supervision and use of RW at d/c. He verbalized understanding. Recommending follow-up with HHPT. Will continue to follow acutely.     Recommendations for follow up therapy are one component of a multi-disciplinary discharge planning process, led by the attending physician.  Recommendations may be updated based on patient status, additional functional criteria and insurance authorization.  Follow Up Recommendations Home health PT      Assistance Recommended at Discharge Frequent or constant Supervision/Assistance  Patient can return home with the following  A little help with walking and/or transfers;A little help with bathing/dressing/bathroom;Assistance with cooking/housework;Direct supervision/assist for medications management;Direct supervision/assist for  financial management;Assist for transportation;Help with stairs or ramp for entrance    Equipment Recommendations None recommended by PT  Recommendations for Other Services       Functional Status Assessment Patient has had a recent decline in their functional status and demonstrates the ability to make significant improvements in function in a reasonable and predictable amount of time.     Precautions / Restrictions Precautions Precautions: Fall Precaution Comments: wrist restraints Restrictions Weight Bearing Restrictions: No      Mobility  Bed Mobility Overal bed mobility: Needs Assistance Bed Mobility: Supine to Sit, Sit to Supine     Supine to sit: Min assist, HOB elevated Sit to supine: Min assist, HOB elevated   General bed mobility comments: Light minA for trunk control coming up to sit and returning to supine, extra time for pt to complete all mobility    Transfers Overall transfer level: Needs assistance Equipment used: Rolling walker (2 wheels), Straight cane Transfers: Sit to/from Stand Sit to Stand: Min guard           General transfer comment: Extra time and effort but pt able to come to stand from EOB to cane 1x and from toilet to RW 1x and EOB to RW 1x with min guard assist. Instability noted but no LOB    Ambulation/Gait Ambulation/Gait assistance: Min guard, Min assist Gait Distance (Feet): 72 Feet (x3 bouts ~12 ft > ~6 ft > ~72 ft) Assistive device: Rolling walker (2 wheels), Straight cane Gait Pattern/deviations: Step-through pattern, Decreased stride length, Shuffle Gait velocity: reduced Gait velocity interpretation: <1.31 ft/sec, indicative of household ambulator   General Gait Details: Pt with slow, unsteady gait when trying to ambulate  in room with cane, often reaching for furniture for support also, min guard-minA to prevent LOB. Improved stability when using RW, min guard assist for safety  Stairs            Wheelchair Mobility     Modified Rankin (Stroke Patients Only)       Balance Overall balance assessment: Needs assistance Sitting-balance support: No upper extremity supported, Feet supported Sitting balance-Leahy Scale: Fair     Standing balance support: Bilateral upper extremity supported, Single extremity supported, During functional activity, No upper extremity supported Standing balance-Leahy Scale: Fair Standing balance comment: Able to stand without UE support but benefits from RW to ambulate                             Pertinent Vitals/Pain Pain Assessment Pain Assessment: Faces Faces Pain Scale: No hurt Pain Intervention(s): Monitored during session    Home Living Family/patient expects to be discharged to:: Private residence Living Arrangements: Spouse/significant other Available Help at Discharge: Family;Available 24 hours/day Type of Home: House Home Access: Stairs to enter Entrance Stairs-Rails: None Entrance Stairs-Number of Steps: 1 (small)   Home Layout: Two level;Able to live on main level with bedroom/bathroom Home Equipment: Shower seat;Cane - single point;Rolling Walker (2 wheels);Grab bars - tub/shower Additional Comments: son lives across the street and is there majority of time to assist pt    Prior Function Prior Level of Function : Needs assist  Cognitive Assist : ADLs (cognitive)     Physical Assist : ADLs (physical)   ADLs (physical): IADLs Mobility Comments: Mod I - uses cane, but son has to remind pt to use it often as pt will often leave it somewhere. Son in agreement that pt needs to use a RW instead though ADLs Comments: Son and wife assist with transport, iADLs, meds, finances etc due to dementia but pt bathes and dresses himself mod I     Hand Dominance        Extremity/Trunk Assessment   Upper Extremity Assessment Upper Extremity Assessment: Generalized weakness    Lower Extremity Assessment Lower Extremity Assessment: Generalized  weakness;RLE deficits/detail;LLE deficits/detail RLE Deficits / Details: MMT scores of 4- to 4 grossly LLE Deficits / Details: MMT scores of 4 to 4+ grossly    Cervical / Trunk Assessment Cervical / Trunk Assessment: Kyphotic  Communication   Communication: HOH  Cognition Arousal/Alertness: Awake/alert Behavior During Therapy: WFL for tasks assessed/performed Overall Cognitive Status: History of cognitive impairments - at baseline                                 General Comments: Pt with dementia and often forgets to use his cane at home. Pt disoriented to location currently, believing he was at home. follows simple cues with extra time.        General Comments General comments (skin integrity, edema, etc.): Educated pt's son via phone that pt is needing close supervision and would benefit from using RW at home due to his high risk for falls. He verbalized understanding; 149/47 and 56 bpm supine, 146/94 and 79 bpm sitting, 150/32 and 90 bpm standing; SpO2 88-90s% on RA    Exercises     Assessment/Plan    PT Assessment Patient needs continued PT services  PT Problem List Decreased strength;Decreased activity tolerance;Decreased mobility;Decreased balance;Decreased coordination;Decreased cognition;Decreased knowledge of use of DME  PT Treatment Interventions DME instruction;Stair training;Gait training;Functional mobility training;Therapeutic activities;Therapeutic exercise;Balance training;Neuromuscular re-education;Cognitive remediation;Patient/family education    PT Goals (Current goals can be found in the Care Plan section)  Acute Rehab PT Goals Patient Stated Goal: to reduce his dizziness PT Goal Formulation: With patient/family Time For Goal Achievement: 08/14/22 Potential to Achieve Goals: Good    Frequency Min 3X/week     Co-evaluation               AM-PAC PT "6 Clicks" Mobility  Outcome Measure Help needed turning from your back to your  side while in a flat bed without using bedrails?: A Little Help needed moving from lying on your back to sitting on the side of a flat bed without using bedrails?: A Little Help needed moving to and from a bed to a chair (including a wheelchair)?: A Little Help needed standing up from a chair using your arms (e.g., wheelchair or bedside chair)?: A Little Help needed to walk in hospital room?: A Little Help needed climbing 3-5 steps with a railing? : A Little 6 Click Score: 18    End of Session Equipment Utilized During Treatment: Gait belt Activity Tolerance: Patient tolerated treatment well Patient left: in bed;with call bell/phone within reach;with bed alarm set;with restraints reapplied;with nursing/sitter in room;Other (comment) (RN aware R wrist restraint not applied due to her working on R wrist IV) Nurse Communication: Mobility status PT Visit Diagnosis: Unsteadiness on feet (R26.81);Other abnormalities of gait and mobility (R26.89);Muscle weakness (generalized) (M62.81);Difficulty in walking, not elsewhere classified (R26.2)    Time: 7169-6789 PT Time Calculation (min) (ACUTE ONLY): 52 min   Charges:   PT Evaluation $PT Eval Moderate Complexity: 1 Mod PT Treatments $Gait Training: 8-22 mins $Therapeutic Activity: 8-22 mins        Moishe Spice, PT, DPT Acute Rehabilitation Services  Office: [phone]   Orvan Falconer 07/31/2022, 8:52 AM

## 2022-07-31 NOTE — Progress Notes (Addendum)
Cardiology Progress Note  Patient ID: SENICA CRALL MRN: [id number] DOB: 1931-11-05 Date of Encounter: 07/31/2022  Primary Cardiologist: Minus Breeding, MD  Subjective   Chief Complaint: Chest pain  HPI: Complains of intermittent chest pain this morning.  Otherwise resting comfortably.  Very confused this morning.  Restraints applied by nursing.  Alert and oriented to self only.  Blood pressure very elevated today.  Has not gotten his home medications yet.  ROS:  All other ROS reviewed and negative. Pertinent positives noted in the HPI.     Inpatient Medications  Scheduled Meds:  amLODipine  5 mg Oral q AM   apixaban  2.5 mg Oral BID   atorvastatin  40 mg Oral q AM   donepezil  10 mg Oral QHS   hydrALAZINE  10 mg Oral Q6H   isosorbide mononitrate  60 mg Oral Daily   metoprolol succinate  25 mg Oral Daily   pantoprazole  80 mg Oral Daily   tamsulosin  0.4 mg Oral q AM   Continuous Infusions:  PRN Meds: acetaminophen, albuterol, ondansetron (ZOFRAN) IV   Vital Signs   Vitals:   07/31/22 0000 07/31/22 0300 07/31/22 0500 07/31/22 0628  BP: (!) 165/51 (!) 184/71  (!) 160/62  Pulse: (!) 50 (!) 50    Resp: 13 17    Temp:  97.8 F (36.6 C)    TempSrc:  Oral    SpO2: 93% 93%    Weight:   74.6 kg   Height:        Intake/Output Summary (Last 24 hours) at 07/31/2022 0833 Last data filed at 07/31/2022 0447 Gross per 24 hour  Intake 280.83 ml  Output 500 ml  Net -219.17 ml      07/31/2022    5:00 AM 07/30/2022   12:49 AM 06/28/2022    9:50 AM  Last 3 Weights  Weight (lbs) 164 lb 7.4 oz 166 lb 0.1 oz 166 lb  Weight (kg) 74.6 kg 75.3 kg 75.297 kg      Telemetry  Overnight telemetry shows V paced rhythm in the 50s, which I personally reviewed.   ECG  The most recent ECG shows V paced rhythm at 52 bpm, which I personally reviewed.   Physical Exam   Vitals:   07/31/22 0000 07/31/22 0300 07/31/22 0500 07/31/22 0628  BP: (!) 165/51 (!) 184/71  (!) 160/62  Pulse: (!)  50 (!) 50    Resp: 13 17    Temp:  97.8 F (36.6 C)    TempSrc:  Oral    SpO2: 93% 93%    Weight:   74.6 kg   Height:        Intake/Output Summary (Last 24 hours) at 07/31/2022 0833 Last data filed at 07/31/2022 0447 Gross per 24 hour  Intake 280.83 ml  Output 500 ml  Net -219.17 ml       07/31/2022    5:00 AM 07/30/2022   12:49 AM 06/28/2022    9:50 AM  Last 3 Weights  Weight (lbs) 164 lb 7.4 oz 166 lb 0.1 oz 166 lb  Weight (kg) 74.6 kg 75.3 kg 75.297 kg    Body mass index is 24.29 kg/m.  General: Well nourished, well developed, in no acute distress Head: Atraumatic, normal size  Eyes: PEERLA, EOMI  Neck: Supple, no JVD Endocrine: No thryomegaly Cardiac: Normal S1, S2; RRR; no murmurs, rubs, or gallops Lungs: Clear to auscultation bilaterally, no wheezing, rhonchi or rales  Abd: Soft, nontender, no hepatomegaly  Ext: No edema, pulses 2+ Musculoskeletal: No deformities, BUE and BLE strength normal and equal Skin: Warm and dry, no rashes   Neuro: Alert, awake, oriented to self only Psych: Normal mood and affect   Labs  High Sensitivity Troponin:   Recent Labs  Lab 07/29/22 2150 07/30/22 0040  TROPONINIHS 18* 18*     Cardiac EnzymesNo results for input(s): "TROPONINI" in the last 168 hours. No results for input(s): "TROPIPOC" in the last 168 hours.  Chemistry Recent Labs  Lab 07/29/22 2150 07/30/22 0526 07/31/22 0013  NA 139 137 139  K 4.4 3.9 4.3  CL 108 105 106  CO2 '24 23 23  '$ GLUCOSE 132* 117* 132*  BUN 31* 32* 29*  CREATININE 2.32* 2.13* 1.91*  CALCIUM 8.4* 8.2* 8.6*  GFRNONAA 26* 29* 33*  ANIONGAP '7 9 10    '$ Hematology Recent Labs  Lab 07/29/22 2150  WBC 7.6  RBC 3.10*  HGB 9.7*  HCT 29.6*  MCV 95.5  MCH 31.3  MCHC 32.8  RDW 13.6  PLT 195   BNPNo results for input(s): "BNP", "PROBNP" in the last 168 hours.  DDimer No results for input(s): "DDIMER" in the last 168 hours.   Radiology  DG Chest Port 1 View  Result Date:  07/29/2022 CLINICAL DATA:  Chest pain EXAM: PORTABLE CHEST 1 VIEW COMPARISON:  05/01/2021 FINDINGS: The lungs are symmetrically well expanded. Lungs are clear. Small bilateral pleural effusions are suspected. No pneumothorax. Transcatheter aortic valve replacement and coronary artery bypass grafting has been performed. Cardiac size is within normal limits. Left subclavian dual lead pacemaker unchanged. Pulmonary vascularity is normal. Right total shoulder arthroplasty has been performed. No acute bone abnormality. IMPRESSION: 1. Small bilateral pleural effusions. Electronically Signed   By: Fidela Salisbury M.D.   On: 07/29/2022 22:15    Cardiac Studies  LHC 11/04/2020 Severe native coronary calcification and multivessel CAD without left main and a common ostium giving  rise to a severely calcified proximal LAD with 95% stenosis and total occlusion of the LAD after a prominent septal perforating artery; and 80% ostial calcified circumflex stenosis followed by 90% proximal to mid circumflex stenosis.  There is competitive filling of the distal circumflex via the SVG supplying the OM 3 vessel.   Total mid occlusion of the native RCA.   Patent LIMA graft supplying the mid LAD with the distal LAD being a small caliber vessel.   Patent SVG supplying the OM 3 vessel of the circumflex coronary artery.   Patent SVG supplying the distal RCA.   Mild aortic valve stenosis with a 17 mm peak gradient on LV to AO pullback.  TTE 01/29/2022  1. Left ventricular ejection fraction, by estimation, is 55 to 60%. The  left ventricle has normal function. The left ventricle has no regional  wall motion abnormalities. There is mild left ventricular hypertrophy.  Left ventricular diastolic parameters  are indeterminate.   2. Right ventricular systolic function is normal. The right ventricular  size is normal. There is normal pulmonary artery systolic pressure. The  estimated right ventricular systolic pressure is 49.4  mmHg.   3. Left atrial size was moderately dilated.   4. Right atrial size was mildly dilated.   5. The mitral valve is degenerative. Moderate mitral valve regurgitation  with diastolic MR noted. Mild mitral stenosis, gradient 3 mmHg at HR 50  bpm. Moderate mitral annular calcification.   6. The aortic valve has been repaired/replaced. Aortic valve  regurgitation is not visualized. There is  a 26 mm Sapien prosthetic (TAVR)  valve present in the aortic position. Procedure Date: 03/03/21. Echo  findings are consistent with stable structure and   function of the aortic valve prosthesis. Aortic valve area, by VTI  measures 1.37 cm. Aortic valve mean gradient measures 10.0 mmHg. Aortic  valve Vmax measures 2.16 m/s.   7. The inferior vena cava is dilated in size with <50% respiratory  variability, suggesting right atrial pressure of 15 mmHg.   Patient Profile  Blake Burgess is a 88 y.o. male with CAD status post CABG, TAVR, dementia, hypertension, CKD, complete heart block status post pacemaker, HFpEF who was admitted on 07/30/2022 for chest pain.  Troponins are flat negative.  EKG unremarkable.  He is very confused this morning.  Assessment & Plan   # Chest pain, noncardiac #CAD s/p CABG -Admitted with chest symptoms.  Has been intermittently confused.  No chest pain reported cardiology yesterday.  Reports some this morning.  Blood pressure severely elevated.  Known three-vessel CAD with patent grafts last year. -No signs of volume overload.  Suspect his chest pain symptoms are related to small vessel disease.  His troponin is 18 x 2.  This is negative.  His symptoms are not concerning to me for unstable angina.  Would recommend good blood pressure control and medical management.  He is not on aspirin due to Eliquis.  Continue amlodipine 5 mg daily, Imdur 60 mg daily, metoprolol succinate 25 mg daily.  Continue statin therapy. -Given CKD would recommend against invasive angiography.  I do not see  how a stress test will change her management.  Again would just recommend medical therapy for now.  He is also on Protonix in the event this is acid reflux related.  #HTN -Would hold home ACE inhibitor.  Hydralazine is a good substitute.  He has CKD stage IV.  Close monitoring as an outpatient.  # HFpEF -Euvolemic.  Continue home medications.  Resume home Lasix.  # History of TAVR -Stable on echo from this summer.  # Complete heart block status post pacemaker implant -Seems to be stable.  # Paroxysmal A-fib -No recurrence.  Continue Eliquis.  [place] will sign off.   Medication Recommendations: As above Other recommendations (labs, testing, etc): None Follow up as an outpatient: We will arrange outpatient follow-up in 2 to 3 weeks  For questions or updates, please contact [facility] Please consult [email or web address] for contact info under    Signed, [place] T. Audie Box, MD, [place]  07/31/2022 8:33 AM

## 2022-07-31 NOTE — TOC Transition Note (Signed)
Transition of Care [facility]) - CM/SW Discharge Note  Patient Details  Name: Blake Burgess MRN: [id number] Date of Birth: 11-26-1931  Transition of Care [facility]) CM/SW Contact:  Gayla Medicus., RN Phone Number: 07/31/2022, 2:04 PM  Clinical Narrative:     RNCM received return call from [facility] and they accepted servoce for HHPT and SLP.  Patient's son aware.  Final next level of care: [place] Barriers to Discharge: No Barriers Identified  Patient Goals and CMS Choice Patient states their goals for this hospitalization and ongoing recovery are:: return to his home CMS Medicare.gov Compare Post Acute Care list provided to:: Patient Represenative (must comment) (son) Choice offered to / list presented to : Adult Children  Discharge Placement                      Discharge Plan and Services   Discharge Planning Services: CM Consult Post Acute Care Choice: Home Health          DME Arranged: N/A DME Agency: NA       HH Arranged: PT, Speech Therapy HH Agency: [place] Date [facility] Agency Contacted: 07/31/22 Time [place] Agency Contacted: [phone] Representative spoke with at [place]: spoke to answering service  Social Determinants of Health (SDOH) Interventions   Readmission Risk Interventions     No data to display

## 2022-07-31 NOTE — Progress Notes (Signed)
Due meds given,crushed  and mixed with apple sauce . Tolerated nectar thick water with the supervision of the speech therapist.

## 2022-07-31 NOTE — Discharge Summary (Signed)
Physician Discharge Summary   Patient: Blake Burgess MRN: [id number] DOB: [birth year]  Admit date:     07/29/2022  Discharge date: 07/31/22  Discharge Physician: Patrecia Pour   PCP: Raelene Bott, MD   Recommendations at discharge:  Follow up with cardiology will be arranged per cardiology consultation in 2-3 weeks.  Follow up with PCP in 1-2 weeks, suggest recheck BMP at follow up. Holding enalapril until that follow up, increased norvasc 2.'5mg'$  > '5mg'$  daily for improved BP control. Continue PT and SLP with home health.  Discharge Diagnoses: Principal Problem:   Chest pain, rule out acute myocardial infarction Active Problems:   AKI (acute kidney injury) ([place])   Essential hypertension   CHB (complete heart block) (HCC)   Chronic combined systolic and diastolic heart failure (HCC)   Type II diabetes mellitus [facility])  Hospital Course: Blake Burgess is an 88 y.o. male with a history of CAD s/p CABG, PAF, CHB s/p PPM, AS s/p TAVR, chronic combined HFrEF, HTN, stage IIIb CKD, T2DM, and dementia who presented to the ED with left/mid chest pain with associated shortness of breath. Troponin level was 18 > 18, ECG without acute ischemic changes, HR bradycardic which is baseline, ASVP on ECG. He appeared dehydrated on exam with creatinine and BUN elevated from baseline. Due to reports of ongoing pain he was admitted. Cardiology recommended observation during which time the patient has had no recurrence of chest pain. Ischemic evaluation is deferred acutely. Lasix and enalapril were held due to AKI with improvement, bland UA. The patient experienced delirium overnight while admitted but remains hemodynamically stable and will be discharged 12/9.      Assessment and Plan: CAD s/p CABG, demand myocardial ischemia, mild: ACS has been ruled out, no longer with chest pain.  Appreciate cardiology evaluation. Pt well known to [facility], seen by Dr. Johnsie Cancel. No plans for ischemic evaluation at this time, I've  ordered a diet. Troponin essentially negative for his degree of CAD. No further chest pain. Not on antiplatelet, will continue reduced dose eliquis (hx PAF remotely). Continue BB, statin.  Severe range HTN/ISH:  - Continue norvasc, augment dosing a bit, continue imdur. Note pt remains on beta blocker despite bradycardic rate with PPM in situ.   AKI on stage IIIb CKD: May be progressing to stage IV CKD.  Creat 2.3 at admission from baseline of about 1.5-1.8. Has returned to 1.9 with IVF and is tolerating diet. Will hold home ACE inhibitor until follow up, but can restart lasix.    T2DM: Has remained at inpatient goal.   Chronic combined systolic and diastolic heart failure (HCC) Small B pleural effusions on CXR, though no respiratory distress or hypoxia. Appeared volume down on admission, euvolemic on day of discharge.    CHB, remote PAF:  PPM in place, pacing at rate of 50, apparently PPM chronically not sensing correctly, cards aware and not much to be done about this per prior cards notes it seems. - Continue eliquis and cardiology follow up  Consultants: Cardiology Procedures performed: None  Disposition: Home Diet recommendation:  Cardiac and Carb modified diet DISCHARGE MEDICATION: Allergies as of 07/31/2022       Reactions   Morphine And Related Other (See Comments)   Narcotics will cause severe confusion per patients son, try to avoid.        Medication List     STOP taking these medications    enalapril 20 MG tablet Commonly known as: VASOTEC  TAKE these medications    ACETAMINOPHEN PO Take 650 mg by mouth in the morning and at bedtime.   albuterol (2.5 MG/3ML) 0.083% nebulizer solution Commonly known as: PROVENTIL Take 3 mLs (2.5 mg total) by nebulization every 6 (six) hours as needed for wheezing or shortness of breath.   amLODipine 5 MG tablet Commonly known as: NORVASC Take 0.5 tablets (2.5 mg total) by mouth in the morning. What changed:  medication strength   apixaban 2.5 MG Tabs tablet Commonly known as: ELIQUIS Take 1 tablet (2.5 mg total) by mouth 2 (two) times daily.   atorvastatin 40 MG tablet Commonly known as: LIPITOR Take 40 mg by mouth in the morning.   cyanocobalamin 1000 MCG tablet Commonly known as: VITAMIN B12 Take 1,000 mcg by mouth daily.   donepezil 10 MG tablet Commonly known as: ARICEPT Take 10 mg by mouth at bedtime.   furosemide 40 MG tablet Commonly known as: LASIX TAKE 40 MG INA THE AM AND 20 MG IN THE PM.   isosorbide mononitrate 60 MG 24 hr tablet Commonly known as: IMDUR TAKE 1 TABLET BY MOUTH EVERY DAY   lidocaine 4 % Apply 1 patch topically daily as needed (pain).   metoprolol succinate 25 MG 24 hr tablet Commonly known as: TOPROL-XL Take 1 tablet (25 mg total) by mouth daily. Take with or immediately following a meal.   nitroGLYCERIN 0.4 MG SL tablet Commonly known as: NITROSTAT Place 1 tablet (0.4 mg total) under the tongue every 5 (five) minutes as needed for chest pain.   omeprazole 20 MG capsule Commonly known as: PRILOSEC Take 20 mg by mouth in the morning.   tamsulosin 0.4 MG Caps capsule Commonly known as: FLOMAX Take 0.4 mg by mouth in the morning.   Trelegy Ellipta 100-62.5-25 MCG/ACT Aepb Generic drug: Fluticasone-Umeclidin-Vilant Inhale 1 puff into the lungs daily.        Follow-up Information     Raelene Bott, MD. Schedule an appointment as soon as possible for a visit in 1 week(s).   Specialty: Internal Medicine Contact information: [place] [address] [phone]         Lendon Colonel, NP Follow up.   Specialties: Cardiology, Radiology, Cardiology Why: Hospital follow-up with Cardiology scheduled for 08/06/2022 at 2:20pm. Please arrive 15 minutes early for check-in. If this date/time does not work for you, please call our office to reschedule. Contact information: [address] [place] [address] [phone]                Discharge Exam: Danley Danker Weights   07/30/22 0049 07/31/22 0500  Weight: 75.3 kg 74.6 kg  BP (!) 160/62   Pulse (!) 50   Temp 98.4 F (36.9 C) (Oral)   Resp 17   Ht '5\' 9"'$  (1.753 m)   Wt 74.6 kg   SpO2 93%   BMI 24.29 kg/m   Confused male in no acute distress. Has significant delirium last night, didn't sleep much. Denies chest pain.  Regular rate of 50, no pitting edema Clear, nonlabored  Condition at discharge: stable  The results of significant diagnostics from this hospitalization (including imaging, microbiology, ancillary and laboratory) are listed below for reference.   Imaging Studies: DG Chest Port 1 View  Result Date: 07/29/2022 CLINICAL DATA:  Chest pain EXAM: PORTABLE CHEST 1 VIEW COMPARISON:  05/01/2021 FINDINGS: The lungs are symmetrically well expanded. Lungs are clear. Small bilateral pleural effusions are suspected. No pneumothorax.  Transcatheter aortic valve replacement and coronary artery bypass grafting has been performed. Cardiac size is within normal limits. Left subclavian dual lead pacemaker unchanged. Pulmonary vascularity is normal. Right total shoulder arthroplasty has been performed. No acute bone abnormality. IMPRESSION: 1. Small bilateral pleural effusions. Electronically Signed   By: Fidela Salisbury M.D.   On: 07/29/2022 22:15   CUP PACEART REMOTE DEVICE CHECK  Result Date: 07/14/2022 Scheduled remote reviewed. Normal device function.  Known elevated RV threshold on remotes.  (0.75V @ 0.78m 6/19 ov) Next remote 91 days. [place]  Microbiology: Results for orders placed or performed during the hospital encounter of 07/29/22  MRSA Next Gen by PCR, Nasal     Status: None   Collection Time: 07/30/22  3:13 PM   Specimen: Nasal Mucosa; Nasal Swab  Result Value Ref Range Status   MRSA by PCR Next Gen NOT DETECTED NOT DETECTED Final    Comment: (NOTE) The GeneXpert MRSA Assay (FDA approved for NASAL  specimens only), is one component of a comprehensive MRSA colonization surveillance program. It is not intended to diagnose MRSA infection nor to guide or monitor treatment for MRSA infections. Test performance is not FDA approved in patients less than 291years old. Performed at [facility] [id number], [place] [address]    Labs: CBC: Recent Labs  Lab 07/29/22 2150  WBC 7.6  HGB 9.7*  HCT 29.6*  MCV 95.5  PLT 1220  Basic Metabolic Panel: Recent Labs  Lab 07/29/22 2150 07/30/22 0526 07/31/22 0013  NA 139 137 139  K 4.4 3.9 4.3  CL 108 105 106  CO2 '24 23 23  '$ GLUCOSE 132* 117* 132*  BUN 31* 32* 29*  CREATININE 2.32* 2.13* 1.91*  CALCIUM 8.4* 8.2* 8.6*   Liver Function Tests: No results for input(s): "AST", "ALT", "ALKPHOS", "BILITOT", "PROT", "ALBUMIN" in the last 168 hours. CBG: Recent Labs  Lab 07/30/22 1216 07/30/22 1701 07/30/22 1920 07/31/22 0354 07/31/22 0755  GLUCAP 117* 170* 167* 127* 145*    Discharge time spent: greater than 30 minutes.  Signed: RPatrecia Pour MD [facility] 07/31/2022

## 2022-07-31 NOTE — Progress Notes (Signed)
With discharge order early, pending swallowing evaluation with speech therapist  who came to see him at around 11 am with recommendations. Daughter came in to visit, claiming that if he is going home his brother will be the one to pick him up for discharge.

## 2022-07-31 NOTE — Progress Notes (Signed)
Discharged home accompanied by son, cane and other belongings  taken home.

## 2022-08-04 NOTE — Progress Notes (Signed)
Remote pacemaker transmission.   

## 2022-08-05 NOTE — Progress Notes (Signed)
Cardiology Clinic Note   Patient Name: Blake Burgess Date of Encounter: 08/06/2022  Primary Care Provider:  Raelene Bott, MD Primary Cardiologist:  Minus Breeding, MD  Patient Profile    88 year old male with history of CAD s/p CABG, PAF, CHB s/p PPM, AS s/p TAVR, chronic combined HFrEF, HTN, stage IIIb CKD, T2DM, and dementia . Recent admission from 07/29/2022 to 07/31/2022 for chest pain. He was found to be dehydrated with AKI (Creatinine of 2.3), which improved with hydration to 1.9. Cardiology was consulted and did not recommend ischemic workup as troponin and EKG were negative for ACS. Enalapril was discontinued.He was continued on amlodipine, isosorbide, and metoprolol.   Past Medical History    Past Medical History:  Diagnosis Date   Aortic stenosis    mild AS 09/2017 echo   Cancer (HCC)    skin   CHF (congestive heart failure) (HCC)    COPD (chronic obstructive pulmonary disease) (HCC)    Stage 3 per patient's son   Coronary artery disease    a.  s/p CABG;   b. cath 4/12: EF 55%, 3vCAD, patent L-LAD, patent S-RCA, patent S-CFX (done after a false pos. ETT)   Dementia [facility])    Per son   Diverticular disease    GERD (gastroesophageal reflux disease)    GI bleed    Hemorrhoids    HH (hiatus hernia)    History of kidney stones    Hypertension    Osteoarthritis    Other and unspecified hyperlipidemia    Presence of permanent cardiac pacemaker    Schatzki's ring    Stroke [facility])    Past Surgical History:  Procedure Laterality Date   ARTERIOVENOUS GRAFT PLACEMENT W/ ENDOSCOPIC VEIN HARVEST     of the right leg greater spahenous vein. Surgeon: Tharon Aquas Trigt,M.D.   BACK SURGERY  2017   COLONOSCOPY  02/24/2010   Hemorrhoids, Diverticulosis. Performed at [place]. Normal terminal ileum. Dr. June Leap, [place] ARTERY BYPASS GRAFT  06/21/2007   CABG x 3 Surgeon Ivin Poot, MD   EYE SURGERY     bilateral cataract removal   hip replace  06/09/2004   left  hip Surgeon Pietro Cassis. Alvan Dame, MD   INTRAOPERATIVE TRANSTHORACIC ECHOCARDIOGRAM Left 03/03/2021   Procedure: INTRAOPERATIVE TRANSTHORACIC ECHOCARDIOGRAM;  Surgeon: Burnell Blanks, MD;  Location: [place];  Service: Open Heart Surgery;  Laterality: Left;   LAPAROSCOPIC CHOLECYSTECTOMY  2021   LEFT HEART CATH AND CORS/GRAFTS ANGIOGRAPHY N/A 11/04/2020   Procedure: LEFT HEART CATH AND CORS/GRAFTS ANGIOGRAPHY;  Surgeon: Troy Sine, MD;  Location: [place] CV LAB;  Service: Cardiovascular;  Laterality: N/A;   MULTIPLE EXTRACTIONS WITH ALVEOLOPLASTY N/A 02/03/2021   Procedure: MULTIPLE EXTRACTION WITH ALVEOLOPLASTY;  Surgeon: Charlaine Dalton, DMD;  Location: [place];  Service: Dentistry;  Laterality: N/A;   PACEMAKER IMPLANT N/A 10/03/2017   Procedure: PACEMAKER IMPLANT;  Surgeon: Evans Lance, MD;  Location: [place] CV LAB;  Service: Cardiovascular;  Laterality: N/A;   PACEMAKER LEAD REMOVAL N/A 04/30/2021   Procedure: PACEMAKER LEAD REMOVAL AND REPLACMENT;  Surgeon: Evans Lance, MD;  Location: [place];  Service: Cardiovascular;  Laterality: N/A;   REVERSE SHOULDER ARTHROPLASTY Right 08/25/2018   Procedure: REVERSE SHOULDER ARTHROPLASTY;  Surgeon: Netta Cedars, MD;  Location: [place];  Service: Orthopedics;  Laterality: Right;   TRANSCATHETER AORTIC VALVE REPLACEMENT, TRANSFEMORAL Bilateral 03/03/2021   Procedure: TRANSCATHETER AORTIC VALVE REPLACEMENT, TRANSFEMORAL;  Surgeon: Burnell Blanks, MD;  Location: [facility];  Service: Open Heart Surgery;  Laterality: Bilateral;   ULTRASOUND GUIDANCE FOR VASCULAR ACCESS Bilateral 03/03/2021   Procedure: ULTRASOUND GUIDANCE FOR VASCULAR ACCESS;  Surgeon: Burnell Blanks, MD;  Location: [place];  Service: Open Heart Surgery;  Laterality: Bilateral;    Allergies  Allergies  Allergen Reactions   Morphine And Related Other (See Comments)    Narcotics will cause severe confusion per patients son, try to avoid.    History of Present  Illness    Blake Burgess is an 87 year old male patient with history as outlined above with recent hospitalization for chest pain and was ruled out for ACS diagnosed with AKI and dehydration.  Blake Burgess is nonverbal due to dementia.  He is accompanied by his son.  After returning home the patient did go back to ED at [facility] for recurrent chest pain and again was found to be dehydrated.  He was given IV fluids and return to home.  His son states that he takes very hot showers and also stays in her room with a portable heater on high for over an hour and becomes very hot and complains of a headache.  They feel like this is contributing.  The patient is nonverbal and therefore review of systems is difficult.  Information is provided by his son who accompanies him.  Home Medications    Current Outpatient Medications  Medication Sig Dispense Refill   ACETAMINOPHEN PO Take 650 mg by mouth in the morning and at bedtime.     albuterol (PROVENTIL) (2.5 MG/3ML) 0.083% nebulizer solution Take 3 mLs (2.5 mg total) by nebulization every 6 (six) hours as needed for wheezing or shortness of breath. 75 mL 12   amLODipine (NORVASC) 5 MG tablet Take 0.5 tablets (2.5 mg total) by mouth in the morning. 30 tablet 0   apixaban (ELIQUIS) 2.5 MG TABS tablet Take 1 tablet (2.5 mg total) by mouth 2 (two) times daily. 60 tablet 3   atorvastatin (LIPITOR) 40 MG tablet Take 40 mg by mouth in the morning.     cyanocobalamin (VITAMIN B12) 1000 MCG tablet Take 1,000 mcg by mouth daily.     donepezil (ARICEPT) 10 MG tablet Take 10 mg by mouth at bedtime.     Fluticasone-Umeclidin-Vilant (TRELEGY ELLIPTA) 100-62.5-25 MCG/ACT AEPB Inhale 1 puff into the lungs daily. 60 each 0   isosorbide mononitrate (IMDUR) 60 MG 24 hr tablet TAKE 1 TABLET BY MOUTH EVERY DAY 90 tablet 3   Lidocaine 4 % PTCH Apply 1 patch topically daily as needed (pain).     metoprolol succinate (TOPROL-XL) 25 MG 24 hr tablet Take 1 tablet (25 mg total) by  mouth daily. Take with or immediately following a meal. 90 tablet 1   nitroGLYCERIN (NITROSTAT) 0.4 MG SL tablet Place 1 tablet (0.4 mg total) under the tongue every 5 (five) minutes as needed for chest pain. 25 tablet 3   omeprazole (PRILOSEC) 20 MG capsule Take 20 mg by mouth in the morning.     tamsulosin (FLOMAX) 0.4 MG CAPS capsule Take 0.4 mg by mouth in the morning.     furosemide (LASIX) 40 MG tablet Take 40 mg by mouth in the morning. 60 tablet 3   No current facility-administered medications for this visit.     Family History    Family History  Problem Relation Age of Onset   Heart attack Mother    Hypertension Mother    Diabetes Father    Diabetes Brother  Diabetes Sister    He indicated that his mother is deceased. He indicated that his father is deceased. He indicated that the status of his sister is unknown. He indicated that the status of his brother is unknown. He indicated that his maternal grandmother is deceased. He indicated that his maternal grandfather is alive. He indicated that his paternal grandmother is deceased. He indicated that his paternal grandfather is deceased.  Social History    Social History   Socioeconomic History   Marital status: Married    Spouse name: Not on file   Number of children: 2   Years of education: Not on file   Highest education level: Not on file  Occupational History   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 Daily Caffeine: 24 oz pepsi and 1 cup coffee.    Social Determinants of Health   Financial Resource Strain: Not on file  Food Insecurity: Not on file  Transportation Needs: Not on file  Physical Activity: Not on file   Stress: Not on file  Social Connections: Not on file  Intimate Partner Violence: Not on file     Review of Systems    General:  No chills, fever, night sweats or weight changes.  Cardiovascular:  No chest pain, dyspnea on exertion, edema, orthopnea, palpitations, paroxysmal nocturnal dyspnea. Dermatological: No rash, lesions/masses Respiratory: No cough, dyspnea Urologic: No hematuria, dysuria Abdominal:   No nausea, vomiting, diarrhea, bright red blood per rectum, melena, or hematemesis Neurologic:  No visual changes, wkns, changes in mental status. All other systems reviewed and are otherwise negative except as noted above.     Physical Exam    VS:  BP (!) 120/50 (BP Location: Left Arm, Patient Position: Sitting, Cuff Size: Normal)   Pulse (!) 57   Ht _0  (1.753 m)   BMI 24.29 kg/m  , BMI Body mass index is 24.29 kg/m.     GEN: Well nourished, well developed, in no acute distress.  Frail, nonverbal HEENT: normal. Neck: Supple, no JVD, carotid bruits, or masses. Cardiac: RRR, 2/6 systolic murmurs, heard best at the left sternal border, rubs, or gallops. No clubbing, cyanosis, edema.  Radials/DP/PT 2+ and equal bilaterally.  Respiratory:  Respirations regular and unlabored, clear to auscultation bilaterally. GI: Soft, nontender, nondistended, BS + x 4. MS: no deformity or atrophy. Skin: warm and dry, no rash.  Multiple skin tags and lesions on scalp. Neuro:  Strength and sensation are intact. Psych: Flat affect.  Accessory Clinical Findings    ECG personally reviewed by me today-AV pacing, heart rate of 57 bpm.- No acute changes  Lab Results  Component Value Date   WBC 7.6 07/29/2022   HGB 9.7 (L) 07/29/2022   HCT 29.6 (L) 07/29/2022   MCV 95.5 07/29/2022   PLT 195 07/29/2022   Lab Results  Component Value Date   CREATININE 1.91 (H) 07/31/2022   BUN 29 (H) 07/31/2022   NA 139 07/31/2022   K 4.3 07/31/2022   CL 106 07/31/2022   CO2 23 07/31/2022   Lab  Results  Component Value Date   ALT 15 02/27/2021   AST 20  02/27/2021   ALKPHOS 51 02/27/2021   BILITOT 0.7 02/27/2021   Lab Results  Component Value Date   CHOL 112 10/01/2017   HDL 39 (L) 10/01/2017   LDLCALC 59 10/01/2017   TRIG 68 10/01/2017   CHOLHDL 2.9 10/01/2017    Lab Results  Component Value Date   HGBA1C (H) 06/16/2007    6.5 (NOTE)   The ADA recommends the following therapeutic goals for glycemic   control related to Hgb A1C measurement:   Goal of Therapy:   < 7.0% Hgb A1C   Action Suggested:  > 8.0% Hgb A1C   Ref:  Diabetes Care, 22, Suppl. 1, 1999    Review of Prior Studies: Echocardiogram 01/29/2022 1. Left ventricular ejection fraction, by estimation, is 55 to 60%. The  left ventricle has normal function. The left ventricle has no regional  wall motion abnormalities. There is mild left ventricular hypertrophy.  Left ventricular diastolic parameters  are indeterminate.   2. Right ventricular systolic function is normal. The right ventricular  size is normal. There is normal pulmonary artery systolic pressure. The  estimated right ventricular systolic pressure is 02.7 mmHg.   3. Left atrial size was moderately dilated.   4. Right atrial size was mildly dilated.   5. The mitral valve is degenerative. Moderate mitral valve regurgitation  with diastolic MR noted. Mild mitral stenosis, gradient 3 mmHg at HR 50  bpm. Moderate mitral annular calcification.   6. The aortic valve has been repaired/replaced. Aortic valve  regurgitation is not visualized. There is a 26 mm Sapien prosthetic (TAVR)  valve present in the aortic position. Procedure Date: 03/03/21. Echo  findings are consistent with stable structure and   function of the aortic valve prosthesis. Aortic valve area, by VTI  measures 1.37 cm. Aortic valve mean gradient measures 10.0 mmHg. Aortic  valve Vmax measures 2.16 m/s.   7. The inferior vena cava is dilated in size with <50% respiratory  variability,  suggesting right atrial pressure of 15 mmHg.   Left Heart Cath 11/04/2020  Mid LM to Prox LAD lesion is 95% stenosed. Mid LAD lesion is 100% stenosed. Dist LAD lesion is 25% stenosed. Ost Cx to Prox Cx lesion is 80% stenosed. Mid Cx lesion is 90% stenosed. 2nd Mrg lesion is 50% stenosed. Prox RCA lesion is 95% stenosed. Mid RCA lesion is 100% stenosed.   Severe native coronary calcification and multivessel CAD without left main and a common ostium giving  rise to a severely calcified proximal LAD with 95% stenosis and total occlusion of the LAD after a prominent septal perforating artery; and 80% ostial calcified circumflex stenosis followed by 90% proximal to mid circumflex stenosis.  There is competitive filling of the distal circumflex via the SVG supplying the OM 3 vessel.   Total mid occlusion of the native RCA.   Patent LIMA graft supplying the mid LAD with the distal LAD being a small caliber vessel.   Patent SVG supplying the OM 3 vessel of the circumflex coronary artery.   Patent SVG supplying the distal RCA.   Mild aortic valve stenosis with a 17 mm peak gradient on LV to AO pullback.   RECOMMENDATION: Increase medical therapy with possible further titration of amlodipine and beta-blocker therapy  Assessment & Plan   1.  Chronic dehydration: Seen in ED this week for same in [facility], and recent hospitalization in early December for same with AKI.  He is currently on Lasix 40 mg twice daily.  I  have decreased this to once a day, and plan to wean him down to 20 mg daily if this is tolerated without recurrent fluid accumulation.  He has no evidence of volume overload at this time.  His family becomes concerned that he does have some lower extremity edema.  He is not very active and often keeps his legs in dependent position.  He is on isosorbide and amlodipine which can add to dependent edema.  I do not think he requires as much Lasix as he is taking and therefore I am  weaning him down and will repeat a be met in 1 month.  I have explained this to his son who verbalizes understanding.  2.  CAD: Status post CABG.  The patient has chronic chest discomfort but has been ruled out for ACS on 2 ER visits and 1 admission.  Continue isosorbide and metoprolol as directed along with blood pressure control.  No planned testing or medication changes other than decreasing Lasix.  3.  Chronic combined HFrEF: Most recent echocardiogram June 2023 revealed a normal ejection fraction of 55 to 60%, with mild left ventricular hypertrophy.  Continue current Kaisha regimen with reduction of Lasix dose.  4.  Status post aortic valve replacement: 26 mm SAPIEN prosthetic TAVR.  Most recent echocardiogram revealed normal functioning of valve.  5.  Hypertension: Current blood pressure is well-controlled.  He was taken off of enalapril during hospitalization in the setting of AKI.  Continue amlodipine, isosorbide, and metoprolol only.  Do not wish to reinstate ACE inhibitor at this time.  Will reevaluate on follow-up   6.  Paroxysmal atrial fibrillation: Heart rate is well-controlled, EKG reveals AV pacing, continue Eliquis.  Currently not a fall risk.    Current medicines are reviewed at length with the patient today.  I have spent 45 min's  dedicated to the care of this patient on the date of this encounter to include pre-visit review of records, assessment, management and diagnostic testing,with shared decision making. Signed, Phill Myron. West Pugh, ANP, AACC   08/06/2022 4:36 PM      Office [phone] Fax [phone]  Notice: This dictation was prepared with Dragon dictation along with smaller phrase technology. Any transcriptional errors that result from this process are unintentional and may not be corrected upon review.

## 2022-08-06 ENCOUNTER — Ambulatory Visit: Payer: Medicare HMO | Attending: Adult Health | Admitting: Adult Health

## 2022-08-06 ENCOUNTER — Encounter: Payer: Self-pay | Admitting: Adult Health

## 2022-08-06 VITALS — BP 120/50 | HR 57 | Ht 69.0 in

## 2022-08-06 DIAGNOSIS — I442 Atrioventricular block, complete: Secondary | ICD-10-CM

## 2022-08-06 DIAGNOSIS — N189 Chronic kidney disease, unspecified: Secondary | ICD-10-CM

## 2022-08-06 DIAGNOSIS — Z952 Presence of prosthetic heart valve: Secondary | ICD-10-CM

## 2022-08-06 DIAGNOSIS — I5042 Chronic combined systolic (congestive) and diastolic (congestive) heart failure: Secondary | ICD-10-CM | POA: Diagnosis not present

## 2022-08-06 DIAGNOSIS — Z951 Presence of aortocoronary bypass graft: Secondary | ICD-10-CM

## 2022-08-06 DIAGNOSIS — I480 Paroxysmal atrial fibrillation: Secondary | ICD-10-CM

## 2022-08-06 DIAGNOSIS — I25810 Atherosclerosis of coronary artery bypass graft(s) without angina pectoris: Secondary | ICD-10-CM

## 2022-08-06 MED ORDER — FUROSEMIDE 40 MG PO TABS: ORAL_TABLET | ORAL | 3 refills | Status: DC

## 2022-08-06 NOTE — Patient Instructions (Signed)
Medication Instructions:  Decrease Lasix to 40 mg daily.   *If you need a refill on your cardiac medications before your next appointment, please call your pharmacy*   Lab Work: BMET 1 month before follow up appointment.  No lab appointment needed.   If you have labs (blood work) drawn today and your tests are completely normal, you will receive your results only by: [place] (if you have MyChart) OR A paper copy in the mail If you have any lab test that is abnormal or we need to change your treatment, we will call you to review the results.   Follow-Up: At [facility], you and your health needs are our priority.  As part of our continuing mission to provide you with exceptional heart care, we have created designated Provider Care Teams.  These Care Teams include your primary Cardiologist (physician) and Advanced Practice Providers (APPs -  Physician Assistants and Nurse Practitioners) who all work together to provide you with the care you need, when you need it.  We recommend signing up for the patient portal called "MyChart".  Sign up information is provided on this After Visit Summary.  MyChart is used to connect with patients for Virtual Visits (Telemedicine).  Patients are able to view lab/test results, encounter notes, upcoming appointments, etc.  Non-urgent messages can be sent to your provider as well.   To learn more about what you can do with MyChart, go to [email or web address].    Your next appointment:   1 month(s)  The format for your next appointment:   In Person  Provider:   Jory Sims, NP or Minus Breeding, MD

## 2022-08-27 ENCOUNTER — Encounter: Payer: Self-pay | Admitting: Internal Medicine

## 2022-09-03 LAB — BASIC METABOLIC PANEL
BUN/Creatinine Ratio: 14 (ref 10–24)
BUN: 22 mg/dL (ref 8–27)
CO2: 22 mmol/L (ref 20–29)
Calcium: 8.6 mg/dL (ref 8.6–10.2)
Chloride: 106 mmol/L (ref 96–106)
Creatinine, Ser: 1.59 mg/dL — ABNORMAL HIGH (ref 0.76–1.27)
Glucose: 106 mg/dL — ABNORMAL HIGH (ref 70–99)
Potassium: 4 mmol/L (ref 3.5–5.2)
Sodium: 143 mmol/L (ref 134–144)
eGFR: 41 mL/min/{1.73_m2} — ABNORMAL LOW (ref >59)

## 2022-09-10 ENCOUNTER — Telehealth: Payer: Self-pay

## 2022-09-10 NOTE — Telephone Encounter (Deleted)
Results viewed via MyChart----- Message from Lendon Colonel, NP sent at 09/03/2022  7:11 AM EST ----- Overall improvement in kidney function with reduction of his lasix to once a day.  Will continue to monitor this on follow up appointments. Continue the lasix 40 mg daily. Call for excessive fluid retention.

## 2022-09-10 NOTE — Telephone Encounter (Deleted)
Results viewed by patient via MyChart----- Message from Lendon Colonel, NP sent at 09/03/2022  7:11 AM EST ----- Overall improvement in kidney function with reduction of his lasix to once a day.  Will continue to monitor this on follow up appointments. Continue the lasix 40 mg daily. Call for excessive fluid retention.

## 2022-09-10 NOTE — Telephone Encounter (Signed)
Patient viewed via [place].

## 2022-09-16 NOTE — Progress Notes (Signed)
Cardiology Clinic Note   Patient Name: Blake Burgess Date of Encounter: 09/17/2022  Primary Care Provider:  Raelene Bott, MD Primary Cardiologist:  Minus Breeding, MD  Patient Profile    89 year old male with history of CAD s/p CABG, PAF, CHB s/p PPM, AS s/p TAVR, chronic combined HFrEF, HTN, stage IIIb CKD, T2DM, and dementia . Recent admission from 07/29/2022 to 07/31/2022 for chest pain. He was found to be dehydrated with AKI (Creatinine of 2.3), which improved with hydration to 1.9.   Cardiology was consulted and did not recommend ischemic workup as troponin and EKG were negative for ACS. Enalapril was discontinued.He was continued on amlodipine, isosorbide, and metoprolol.  The patient is nonverbal and therefore review of systems is difficult.  Information is provided by his son who accompanies him.  Past Medical History    Past Medical History:  Diagnosis Date   Aortic stenosis    mild AS 09/2017 echo   Cancer (HCC)    skin   CHF (congestive heart failure) (HCC)    COPD (chronic obstructive pulmonary disease) (HCC)    Stage 3 per patient's son   Coronary artery disease    a.  s/p CABG;   b. cath 4/12: EF 55%, 3vCAD, patent L-LAD, patent S-RCA, patent S-CFX (done after a false pos. ETT)   Dementia [facility])    Per son   Diverticular disease    GERD (gastroesophageal reflux disease)    GI bleed    Hemorrhoids    HH (hiatus hernia)    History of kidney stones    Hypertension    Osteoarthritis    Other and unspecified hyperlipidemia    Presence of permanent cardiac pacemaker    Schatzki's ring    Stroke [facility])    Past Surgical History:  Procedure Laterality Date   ARTERIOVENOUS GRAFT PLACEMENT W/ ENDOSCOPIC VEIN HARVEST     of the right leg greater spahenous vein. Surgeon: Tharon Aquas Trigt,M.D.   BACK SURGERY  2017   COLONOSCOPY  02/24/2010   Hemorrhoids, Diverticulosis. Performed at [place]. Normal terminal ileum. Dr. June Leap, [place] ARTERY BYPASS GRAFT   06/21/2007   CABG x 3 Surgeon Ivin Poot, MD   EYE SURGERY     bilateral cataract removal   hip replace  06/09/2004   left hip Surgeon Pietro Cassis. Alvan Dame, MD   INTRAOPERATIVE TRANSTHORACIC ECHOCARDIOGRAM Left 03/03/2021   Procedure: INTRAOPERATIVE TRANSTHORACIC ECHOCARDIOGRAM;  Surgeon: Burnell Blanks, MD;  Location: [place];  Service: Open Heart Surgery;  Laterality: Left;   LAPAROSCOPIC CHOLECYSTECTOMY  2021   LEFT HEART CATH AND CORS/GRAFTS ANGIOGRAPHY N/A 11/04/2020   Procedure: LEFT HEART CATH AND CORS/GRAFTS ANGIOGRAPHY;  Surgeon: Troy Sine, MD;  Location: [place] CV LAB;  Service: Cardiovascular;  Laterality: N/A;   MULTIPLE EXTRACTIONS WITH ALVEOLOPLASTY N/A 02/03/2021   Procedure: MULTIPLE EXTRACTION WITH ALVEOLOPLASTY;  Surgeon: Charlaine Dalton, DMD;  Location: [place];  Service: Dentistry;  Laterality: N/A;   PACEMAKER IMPLANT N/A 10/03/2017   Procedure: PACEMAKER IMPLANT;  Surgeon: Evans Lance, MD;  Location: [place] CV LAB;  Service: Cardiovascular;  Laterality: N/A;   PACEMAKER LEAD REMOVAL N/A 04/30/2021   Procedure: PACEMAKER LEAD REMOVAL AND REPLACMENT;  Surgeon: Evans Lance, MD;  Location: [place];  Service: Cardiovascular;  Laterality: N/A;   REVERSE SHOULDER ARTHROPLASTY Right 08/25/2018   Procedure: REVERSE SHOULDER ARTHROPLASTY;  Surgeon: Netta Cedars, MD;  Location: [place];  Service: Orthopedics;  Laterality: Right;  TRANSCATHETER AORTIC VALVE REPLACEMENT, TRANSFEMORAL Bilateral 03/03/2021   Procedure: TRANSCATHETER AORTIC VALVE REPLACEMENT, TRANSFEMORAL;  Surgeon: Burnell Blanks, MD;  Location: [place];  Service: Open Heart Surgery;  Laterality: Bilateral;   ULTRASOUND GUIDANCE FOR VASCULAR ACCESS Bilateral 03/03/2021   Procedure: ULTRASOUND GUIDANCE FOR VASCULAR ACCESS;  Surgeon: Burnell Blanks, MD;  Location: [place];  Service: Open Heart Surgery;  Laterality: Bilateral;    Allergies  Allergies  Allergen Reactions    Morphine And Related Other (See Comments)    Narcotics will cause severe confusion per patients son, try to avoid.    History of Present Illness    Mr. Dasaro returns today for ongoing assessment and management of CAD, Chronic combined CHF, s/p AoV replacement, 26 mm SAPIEN prosthetic TAVR.    HTN and PAF. On last office visit, I decreased his lasix to daily dose of 40 mg to avoid dehydration and hypotension. He was to continue amlodipine, isosorbide, and metoprolol. BMET is to be ordered today.  Mr. Snowdon, the patient's son, states that he has been doing much better.  There is been no unsteadiness on his feet, he appears more alert.  He still is not very verbal.  There is been no edema in his lower extremities.  He has not had any falls.  Home health nurses state that his blood pressures been running in the 130s over 60s pretty consistently.  They do ask about frequent diarrhea.  Usually occurring in the morning.  The patient's wife gives him Imodium at night and in the morning to help with this.  The patient appetite is adequate but he could eat more.  His son states that he eats like a bird.  He enjoys sweets.  Home Medications    Current Outpatient Medications  Medication Sig Dispense Refill   ACETAMINOPHEN PO Take 650 mg by mouth in the morning and at bedtime.     amLODipine (NORVASC) 5 MG tablet Take 0.5 tablets (2.5 mg total) by mouth in the morning. 30 tablet 0   apixaban (ELIQUIS) 2.5 MG TABS tablet Take 1 tablet (2.5 mg total) by mouth 2 (two) times daily. 60 tablet 3   atorvastatin (LIPITOR) 40 MG tablet Take 40 mg by mouth in the morning.     cyanocobalamin (VITAMIN B12) 1000 MCG tablet Take 1,000 mcg by mouth daily.     donepezil (ARICEPT) 10 MG tablet Take 10 mg by mouth at bedtime.     furosemide (LASIX) 40 MG tablet Take 40 mg by mouth in the morning. 60 tablet 3   isosorbide mononitrate (IMDUR) 60 MG 24 hr tablet TAKE 1 TABLET BY MOUTH EVERY DAY 90 tablet 3   Lidocaine 4 %  PTCH Apply 1 patch topically daily as needed (pain).     metoprolol succinate (TOPROL-XL) 25 MG 24 hr tablet Take 1 tablet (25 mg total) by mouth daily. Take with or immediately following a meal. 90 tablet 1   nitroGLYCERIN (NITROSTAT) 0.4 MG SL tablet Place 1 tablet (0.4 mg total) under the tongue every 5 (five) minutes as needed for chest pain. 25 tablet 3   omeprazole (PRILOSEC) 20 MG capsule Take 20 mg by mouth in the morning.     tamsulosin (FLOMAX) 0.4 MG CAPS capsule Take 0.4 mg by mouth in the morning.     No current facility-administered medications for this visit.     Family History    Family History  Problem Relation Age of Onset   Heart attack Mother  Hypertension Mother    Diabetes Father    Diabetes Brother    Diabetes Sister    He indicated that his mother is deceased. He indicated that his father is deceased. He indicated that the status of his sister is unknown. He indicated that the status of his brother is unknown. He indicated that his maternal grandmother is deceased. He indicated that his maternal grandfather is alive. He indicated that his paternal grandmother is deceased. He indicated that his paternal grandfather is deceased.  Social History    Social History   Socioeconomic History   Marital status: Married    Spouse name: Not on file   Number of children: 2   Years of education: Not on file   Highest education level: Not on file  Occupational History   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 Daily Caffeine: 24 oz pepsi and 1 cup coffee.    Social Determinants of Health   Financial Resource Strain: Not on file  Food Insecurity: Not on file   Transportation Needs: Not on file  Physical Activity: Not on file  Stress: Not on file  Social Connections: Not on file  Intimate Partner Violence: Not on file     Review of Systems    General:  No chills, fever, night sweats or weight changes.  Cardiovascular:  No chest pain, dyspnea on exertion, edema, orthopnea, palpitations, paroxysmal nocturnal dyspnea. Dermatological: No rash, lesions/masses Respiratory: No cough, dyspnea Urologic: No hematuria, dysuria Abdominal:   No nausea, vomiting, diarrhea, bright red blood per rectum, melena, or hematemesis Neurologic:  No visual changes, wkns, changes in mental status. All other systems reviewed and are otherwise negative except as noted above.     Physical Exam    VS:  BP (!) 162/47   Pulse (!) 56   Ht '5\' 9"'$  (1.753 m)   Wt 162 lb 3.2 oz (73.6 kg)   SpO2 99%   BMI 23.95 kg/m  , BMI Body mass index is 23.95 kg/m.     GEN: Well nourished, well developed, in no acute distress. HEENT: normal. Neck: Supple, no JVD, carotid bruits, or masses. Cardiac: RRR, 2/6 systolic murmurs, rubs, or gallops. No clubbing, cyanosis, edema.  Radials/DP/PT 2+ and equal bilaterally.  Respiratory:  Respirations regular and unlabored, clear to auscultation bilaterally. GI: Soft, nontender, nondistended, BS + x 4. MS: no deformity or atrophy. Skin: warm and dry, no rash. Neuro:  Strength and sensation are intact. Psych: Normal affect.  Nonverbal, but does smile.  Accessory Clinical Findings    ECG personally reviewed by me today-not completed this office visit  Lab Results  Component Value Date   WBC 7.6 07/29/2022   HGB 9.7 (L) 07/29/2022   HCT 29.6 (L) 07/29/2022   MCV 95.5 07/29/2022   PLT 195 07/29/2022   Lab Results  Component Value Date   CREATININE 1.59 (H) 09/02/2022   BUN 22 09/02/2022   NA 143 09/02/2022   K 4.0 09/02/2022   CL 106 09/02/2022   CO2 22 09/02/2022   Lab Results  Component Value Date   ALT 15 02/27/2021    AST 20 02/27/2021  ALKPHOS 51 02/27/2021   BILITOT 0.7 02/27/2021   Lab Results  Component Value Date   CHOL 112 10/01/2017   HDL 39 (L) 10/01/2017   LDLCALC 59 10/01/2017   TRIG 68 10/01/2017   CHOLHDL 2.9 10/01/2017    Lab Results  Component Value Date   HGBA1C (H) 06/16/2007    6.5 (NOTE)   The ADA recommends the following therapeutic goals for glycemic   control related to Hgb A1C measurement:   Goal of Therapy:   < 7.0% Hgb A1C   Action Suggested:  > 8.0% Hgb A1C   Ref:  Diabetes Care, 22, Suppl. 1, 1999    Review of Prior Studies: Echocardiogram 01/29/2022 1. Left ventricular ejection fraction, by estimation, is 55 to 60%. The  left ventricle has normal function. The left ventricle has no regional  wall motion abnormalities. There is mild left ventricular hypertrophy.  Left ventricular diastolic parameters  are indeterminate.   2. Right ventricular systolic function is normal. The right ventricular  size is normal. There is normal pulmonary artery systolic pressure. The  estimated right ventricular systolic pressure is 61.4 mmHg.   3. Left atrial size was moderately dilated.   4. Right atrial size was mildly dilated.   5. The mitral valve is degenerative. Moderate mitral valve regurgitation  with diastolic MR noted. Mild mitral stenosis, gradient 3 mmHg at HR 50  bpm. Moderate mitral annular calcification.   6. The aortic valve has been repaired/replaced. Aortic valve  regurgitation is not visualized. There is a 26 mm Sapien prosthetic (TAVR)  valve present in the aortic position. Procedure Date: 03/03/21. Echo  findings are consistent with stable structure and   function of the aortic valve prosthesis. Aortic valve area, by VTI  measures 1.37 cm. Aortic valve mean gradient measures 10.0 mmHg. Aortic  valve Vmax measures 2.16 m/s.   7. The inferior vena cava is dilated in size with <50% respiratory  variability, suggesting right atrial pressure of 15 mmHg.    Left  Heart Cath 11/04/2020   Mid LM to Prox LAD lesion is 95% stenosed. Mid LAD lesion is 100% stenosed. Dist LAD lesion is 25% stenosed. Ost Cx to Prox Cx lesion is 80% stenosed. Mid Cx lesion is 90% stenosed. 2nd Mrg lesion is 50% stenosed. Prox RCA lesion is 95% stenosed. Mid RCA lesion is 100% stenosed.   Severe native coronary calcification and multivessel CAD without left main and a common ostium giving  rise to a severely calcified proximal LAD with 95% stenosis and total occlusion of the LAD after a prominent septal perforating artery; and 80% ostial calcified circumflex stenosis followed by 90% proximal to mid circumflex stenosis.  There is competitive filling of the distal circumflex via the SVG supplying the OM 3 vessel.   Total mid occlusion of the native RCA.   Patent LIMA graft supplying the mid LAD with the distal LAD being a small caliber vessel.   Patent SVG supplying the OM 3 vessel of the circumflex coronary artery.   Patent SVG supplying the distal RCA.   Mild aortic valve stenosis with a 17 mm peak gradient on LV to AO pullback.   RECOMMENDATION: Increase medical therapy with possible further titration of amlodipine and beta-blocker therapy  Assessment & Plan   1.  Coronary artery disease: Coronary artery bypass grafting by history.  The patient denies any chest pain nor expresses any discomfort to his family.  He is able to get up and walk around without having  to stop to breathe or because of discomfort in his chest.  Continue secondary management with no planned intervention or testing due to age and comorbidities.  He will continue blood pressure control, statin therapy, beta-blocker.  He is doing well.  2.  Paroxysmal atrial fibrillation: Per auscultation the patient has a regular rhythm.  Continue Eliquis and metoprolol as directed.  The family has not expressed any issues of melena or hemoptysis or epistaxis on Eliquis.  Will continue to monitor.  He was found to  be slightly anemic on lab work 1 year ago.  This is followed by his PCP.  3.  Hypertension: I rechecked his blood pressure in the clinic room.  It was 130/58.  This corresponds to what his son states the home health nurses are getting.  I will not make any changes in his medication regimen at this time.  He is doing well with lower dose of Lasix to 40 mg daily and discontinuation of lisinopril.  Repeat labs revealed an improvement in serum creatinine from 2.32-1.59.  4.  Daily loose stools: I have suggested that they stop the omeprazole for about a week to see if this would be helpful.  If no change he would need to try Metamucil Gummies every other day.  His wife is giving him daily Imodium due to the loose stools.  Hopefully he will need less of this to avoid anticholinergic side effects of sedation.  He will follow-up with PCP if this still becomes an issue with these recommendations.     Current medicines are reviewed at length with the patient today.  I have spent 25 min's  dedicated to the care of this patient on the date of this encounter to include pre-visit review of records, assessment, management and diagnostic testing,with shared decision making. Signed, Phill Myron. West Pugh, ANP, [place]   09/17/2022 4:29 PM      Office [phone] Fax [phone]  Notice: This dictation was prepared with Dragon dictation along with smaller phrase technology. Any transcriptional errors that result from this process are unintentional and may not be corrected upon review.

## 2022-09-17 ENCOUNTER — Ambulatory Visit: Payer: Medicare HMO | Attending: Adult Health | Admitting: Adult Health

## 2022-09-17 ENCOUNTER — Encounter: Payer: Self-pay | Admitting: Adult Health

## 2022-09-17 VITALS — BP 162/47 | HR 56 | Ht 69.0 in | Wt 162.2 lb

## 2022-09-17 DIAGNOSIS — I480 Paroxysmal atrial fibrillation: Secondary | ICD-10-CM | POA: Diagnosis not present

## 2022-09-17 DIAGNOSIS — I10 Essential (primary) hypertension: Secondary | ICD-10-CM

## 2022-09-17 DIAGNOSIS — E782 Mixed hyperlipidemia: Secondary | ICD-10-CM | POA: Diagnosis not present

## 2022-09-17 DIAGNOSIS — Z952 Presence of prosthetic heart valve: Secondary | ICD-10-CM

## 2022-09-17 DIAGNOSIS — I2510 Atherosclerotic heart disease of native coronary artery without angina pectoris: Secondary | ICD-10-CM

## 2022-09-17 DIAGNOSIS — I442 Atrioventricular block, complete: Secondary | ICD-10-CM

## 2022-09-17 DIAGNOSIS — R195 Other fecal abnormalities: Secondary | ICD-10-CM

## 2022-09-17 NOTE — Patient Instructions (Signed)
Medication Instructions:  No Changes *If you need a refill on your cardiac medications before your next appointment, please call your pharmacy*   Lab Work: No Labs  If you have labs (blood work) drawn today and your tests are completely normal, you will receive your results only by: [place] (if you have MyChart) OR A paper copy in the mail If you have any lab test that is abnormal or we need to change your treatment, we will call you to review the results.   Testing/Procedures: No Testing   Follow-Up: At [facility], you and your health needs are our priority.  As part of our continuing mission to provide you with exceptional heart care, we have created designated Provider Care Teams.  These Care Teams include your primary Cardiologist (physician) and Advanced Practice Providers (APPs -  Physician Assistants and Nurse Practitioners) who all work together to provide you with the care you need, when you need it.  We recommend signing up for the patient portal called "MyChart".  Sign up information is provided on this After Visit Summary.  MyChart is used to connect with patients for Virtual Visits (Telemedicine).  Patients are able to view lab/test results, encounter notes, upcoming appointments, etc.  Non-urgent messages can be sent to your provider as well.   To learn more about what you can do with MyChart, go to [email or web address].    Your next appointment:   6 month(s)  Provider:   Jory Sims, DNP, ANP     Other Instructions Recommend Metamucill Chewables

## 2022-10-13 ENCOUNTER — Ambulatory Visit (INDEPENDENT_AMBULATORY_CARE_PROVIDER_SITE_OTHER): Payer: Medicare HMO

## 2022-10-13 DIAGNOSIS — I442 Atrioventricular block, complete: Secondary | ICD-10-CM

## 2022-10-13 LAB — CUP PACEART REMOTE DEVICE CHECK
Battery Remaining Longevity: 34 mo
Battery Voltage: 2.93 V
Brady Statistic AP VP Percent: 78.71 %
Brady Statistic AP VS Percent: 0.71 %
Brady Statistic AS VP Percent: 18.59 %
Brady Statistic AS VS Percent: 1.99 %
Brady Statistic RA Percent Paced: 81.07 %
Brady Statistic RV Percent Paced: 97.3 %
Date Time Interrogation Session: 20240221001832
Implantable Lead Connection Status: 753985
Implantable Lead Connection Status: 753985
Implantable Lead Implant Date: 20190211
Implantable Lead Implant Date: 20220908
Implantable Lead Location: 753859
Implantable Lead Location: 753860
Implantable Lead Model: 3830
Implantable Lead Model: 5076
Implantable Pulse Generator Implant Date: 20190211
Lead Channel Impedance Value: 228 Ohm
Lead Channel Impedance Value: 342 Ohm
Lead Channel Impedance Value: 342 Ohm
Lead Channel Impedance Value: 475 Ohm
Lead Channel Pacing Threshold Amplitude: 1.875 V
Lead Channel Pacing Threshold Amplitude: 2.5 V
Lead Channel Pacing Threshold Pulse Width: 0.4 ms
Lead Channel Pacing Threshold Pulse Width: 0.4 ms
Lead Channel Sensing Intrinsic Amplitude: 0.375 mV
Lead Channel Sensing Intrinsic Amplitude: 0.375 mV
Lead Channel Sensing Intrinsic Amplitude: 1.375 mV
Lead Channel Sensing Intrinsic Amplitude: 1.375 mV
Lead Channel Setting Pacing Amplitude: 2 V
Lead Channel Setting Pacing Amplitude: 2.5 V
Lead Channel Setting Pacing Pulse Width: 0.4 ms
Lead Channel Setting Sensing Sensitivity: 0.6 mV
Zone Setting Status: 755011
Zone Setting Status: 755011

## 2022-11-02 ENCOUNTER — Encounter: Payer: Self-pay | Admitting: Vascular Surgery

## 2022-11-02 ENCOUNTER — Ambulatory Visit: Payer: Medicare HMO | Admitting: Vascular Surgery

## 2022-11-02 VITALS — BP 160/74 | HR 83 | Temp 97.7°F | Resp 16 | Ht 69.0 in | Wt 163.0 lb

## 2022-11-02 DIAGNOSIS — I739 Peripheral vascular disease, unspecified: Secondary | ICD-10-CM | POA: Diagnosis not present

## 2022-11-02 NOTE — Progress Notes (Signed)
Patient name: Blake Burgess MRN: [id number] DOB: [birth year] Sex: male  REASON FOR CONSULT: 45 month follow-up PAD  HPI: Blake Burgess is a 89 y.o. male, with history of coronary artery disease status post CABG, severe COPD, A-fib on Eliquis, heart failure, stage III CKD, severe aortic stenosis status post TAVR now with a EF of 55% presents for 6 month follow-up of his PAD.  He was previously referred for leg wounds 6 months ago and on initial evaluation we did not appreciate any wounds or other abrasions.  Noncompressible ABIs with monophasic waveforms with evidence of PAD that was asymptomatic.  On follow-up today he has no new wounds.  Still has chronic numbness in his feet.  He is seeing podiatry now.  No new complaints.  Past Medical History:  Diagnosis Date   Aortic stenosis    mild AS 09/2017 echo   Cancer [facility])    skin   CHF (congestive heart failure) (HCC)    COPD (chronic obstructive pulmonary disease) (HCC)    Stage 3 per patient's son   Coronary artery disease    a.  s/p CABG;   b. cath 4/12: EF 55%, 3vCAD, patent L-LAD, patent S-RCA, patent S-CFX (done after a false pos. ETT)   [facility])    Per son   Diverticular disease    GERD (gastroesophageal reflux disease)    GI bleed    Hemorrhoids    HH (hiatus hernia)    History of kidney stones    Hypertension    Osteoarthritis    Other and unspecified hyperlipidemia    Presence of permanent cardiac pacemaker    Schatzki's ring    Stroke [facility])     Past Surgical History:  Procedure Laterality Date   ARTERIOVENOUS GRAFT PLACEMENT W/ ENDOSCOPIC VEIN HARVEST     of the right leg greater spahenous vein. Surgeon: Tharon Aquas Trigt,M.D.   BACK SURGERY  2017   COLONOSCOPY  02/24/2010   Hemorrhoids, Diverticulosis. Performed at [place]. Normal terminal ileum. Dr. June Leap, [place] ARTERY BYPASS GRAFT  06/21/2007   CABG x 3 Surgeon Ivin Poot, MD   EYE SURGERY     bilateral cataract removal   hip  replace  06/09/2004   left hip Surgeon Pietro Cassis. Alvan Dame, MD   INTRAOPERATIVE TRANSTHORACIC ECHOCARDIOGRAM Left 03/03/2021   Procedure: INTRAOPERATIVE TRANSTHORACIC ECHOCARDIOGRAM;  Surgeon: Burnell Blanks, MD;  Location: [place];  Service: Open Heart Surgery;  Laterality: Left;   LAPAROSCOPIC CHOLECYSTECTOMY  2021   LEFT HEART CATH AND CORS/GRAFTS ANGIOGRAPHY N/A 11/04/2020   Procedure: LEFT HEART CATH AND CORS/GRAFTS ANGIOGRAPHY;  Surgeon: Troy Sine, MD;  Location: [place] [facility];  Service: Cardiovascular;  Laterality: N/A;   MULTIPLE EXTRACTIONS WITH ALVEOLOPLASTY N/A 02/03/2021   Procedure: MULTIPLE EXTRACTION WITH ALVEOLOPLASTY;  Surgeon: Charlaine Dalton, DMD;  Location: [place];  Service: Dentistry;  Laterality: N/A;   PACEMAKER IMPLANT N/A 10/03/2017   Procedure: PACEMAKER IMPLANT;  Surgeon: Evans Lance, MD;  Location: [place] [facility];  Service: Cardiovascular;  Laterality: N/A;   PACEMAKER LEAD REMOVAL N/A 04/30/2021   Procedure: PACEMAKER LEAD REMOVAL AND REPLACMENT;  Surgeon: Evans Lance, MD;  Location: [place];  Service: Cardiovascular;  Laterality: N/A;   REVERSE SHOULDER ARTHROPLASTY Right 08/25/2018   Procedure: REVERSE SHOULDER ARTHROPLASTY;  Surgeon: Netta Cedars, MD;  Location: [place];  Service: Orthopedics;  Laterality: Right;   TRANSCATHETER AORTIC VALVE REPLACEMENT, TRANSFEMORAL Bilateral 03/03/2021  Procedure: TRANSCATHETER AORTIC VALVE REPLACEMENT, TRANSFEMORAL;  Surgeon: Burnell Blanks, MD;  Location: [place];  Service: Open Heart Surgery;  Laterality: Bilateral;   ULTRASOUND GUIDANCE FOR VASCULAR ACCESS Bilateral 03/03/2021   Procedure: ULTRASOUND GUIDANCE FOR VASCULAR ACCESS;  Surgeon: Burnell Blanks, MD;  Location: [place];  Service: Open Heart Surgery;  Laterality: Bilateral;    Family History  Problem Relation Age of Onset   Heart attack Mother    Hypertension Mother    Diabetes Father    Diabetes Brother    Diabetes Sister      SOCIAL HISTORY: Social History   Socioeconomic History   Marital status: Married    Spouse name: Not on file   Number of children: 2   Years of education: Not on file   Highest education level: Not on file  Occupational History   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 Daily Caffeine: 24 oz pepsi and 1 cup coffee.    Social Determinants of Health   Financial Resource Strain: Not on file  Food Insecurity: Not on file  Transportation Needs: Not on file  Physical Activity: Not on file  Stress: Not on file  Social Connections: Not on file  Intimate Partner Violence: Not on file    Allergies  Allergen Reactions   Morphine And Related Other (See Comments)    Narcotics will cause severe confusion per patients son, try to avoid.    Current Outpatient Medications  Medication Sig Dispense Refill   ACETAMINOPHEN PO Take 650 mg by mouth in the morning and at bedtime.     amLODipine (NORVASC) 5 MG tablet Take 0.5 tablets (2.5 mg total) by mouth in the morning. 30 tablet 0   apixaban (ELIQUIS) 2.5 MG TABS tablet Take 1 tablet (2.5 mg total) by mouth 2 (two) times daily. 60 tablet 3   atorvastatin (LIPITOR) 40 MG tablet Take 40 mg by mouth in the morning.     cyanocobalamin (VITAMIN B12) 1000 MCG tablet Take 1,000 mcg by mouth daily.     donepezil (ARICEPT) 10 MG tablet Take 10 mg by mouth at bedtime.     furosemide (LASIX) 40 MG tablet Take 40 mg by mouth in the morning. 60 tablet 3   isosorbide mononitrate (IMDUR) 60 MG 24 hr tablet TAKE 1 TABLET BY MOUTH EVERY DAY 90 tablet 3   Lidocaine 4 % PTCH Apply 1 patch topically daily as needed (pain).     metoprolol succinate (TOPROL-XL) 25 MG  24 hr tablet Take 1 tablet (25 mg total) by mouth daily. Take with or immediately following a meal. 90 tablet 1   nitroGLYCERIN (NITROSTAT) 0.4 MG SL tablet Place 1 tablet (0.4 mg total) under the tongue every 5 (five) minutes as needed for chest pain. 25 tablet 3   tamsulosin (FLOMAX) 0.4 MG CAPS capsule Take 0.4 mg by mouth in the morning.     omeprazole (PRILOSEC) 20 MG capsule Take 20 mg by mouth in the morning. (Patient not taking: Reported on 11/02/2022)     No current facility-administered medications for this visit.    REVIEW OF SYSTEMS:  '[X]'$  denotes positive  finding, '[ ]'$  denotes negative finding Cardiac  Comments:  Chest pain or chest pressure:    Shortness of breath upon exertion:    Short of breath when lying flat:    Irregular heart rhythm:        Vascular    Pain in calf, thigh, or hip brought on by ambulation:    Pain in feet at night that wakes you up from your sleep:     Blood clot in your veins:    Leg swelling:         Pulmonary    Oxygen at home:    Productive cough:     Wheezing:         Neurologic    Sudden weakness in arms or legs:     Sudden numbness in arms or legs:     Sudden onset of difficulty speaking or slurred speech:    Temporary loss of vision in one eye:     Problems with dizziness:         Gastrointestinal    Blood in stool:     Vomited blood:         Genitourinary    Burning when urinating:     Blood in urine:        Psychiatric    Major depression:         Hematologic    Bleeding problems:    Problems with blood clotting too easily:        Skin    Rashes or ulcers:        Constitutional    Fever or chills:      PHYSICAL EXAM: Vitals:   11/02/22 1525  BP: (!) 160/74  Pulse: 83  Resp: 16  Temp: 97.7 F (36.5 C)  TempSrc: Temporal  Weight: 163 lb (73.9 kg)  Height: '5\' 9"'$  (1.753 m)    GENERAL: The patient is a well-nourished male, in no acute distress. The vital signs are documented above. CARDIAC: There is a regular  rate and rhythm.  VASCULAR:  1+ palpable femoral pulses bilaterally Right DP monophasic and PT more brisk Left DP and PT more brisk No lower extremity tissue loss PULMONARY: No respiratory distress. ABDOMEN: Soft and non-tender. MUSCULOSKELETAL: There are no major deformities or cyanosis. NEUROLOGIC: No focal weakness or paresthesias are detected. SKIN: There are no ulcers or rashes noted. PSYCHIATRIC: The patient has a normal affect.   DATA:   ABI's 6 month ago were noncompressible with TP 41 right and 0 left  Assessment/Plan:  89 year old male with multiple medical comorbidities as noted above that presents for 28-monthfollow-up of his PAD.  Previously referred for what the referral stated was wounds but we did not appreciate any on initial exam.  Again today he has no evidence of wounds.  He has no rest pain to suggest CLI.  He does have evidence of PAD based on his previous ABIs being noncompressible with monophasic waveforms but again discussed for asymptomatic PAD we recommend medical management with observation.  I will see him again in 1 year in the [facility] clinic with repeat ABIs.  Discussed with his son he call if he develops any non-healing wounds or other concerns.     CMarty Heck MD [facility]St. AugustineOffice: [phone]

## 2022-11-09 ENCOUNTER — Other Ambulatory Visit: Payer: Self-pay | Admitting: Internal Medicine

## 2022-11-09 DIAGNOSIS — I480 Paroxysmal atrial fibrillation: Secondary | ICD-10-CM

## 2022-11-09 NOTE — Telephone Encounter (Signed)
Prescription refill request for Eliquis received. Indication Afib  Last office visit: 09/17/22 Joline Salt)  Scr: 1.50 (09/28/22)  Age: 89 Weight: 73.9kg  Appropriate dose. Refill sent.

## 2022-11-10 NOTE — Progress Notes (Signed)
Remote pacemaker transmission.   

## 2022-11-22 ENCOUNTER — Other Ambulatory Visit: Payer: Self-pay | Admitting: Cardiology

## 2022-12-22 ENCOUNTER — Other Ambulatory Visit: Payer: Self-pay | Admitting: Cardiology

## 2023-01-12 ENCOUNTER — Ambulatory Visit (INDEPENDENT_AMBULATORY_CARE_PROVIDER_SITE_OTHER): Payer: Medicare HMO

## 2023-01-12 DIAGNOSIS — I442 Atrioventricular block, complete: Secondary | ICD-10-CM | POA: Diagnosis not present

## 2023-01-12 LAB — CUP PACEART REMOTE DEVICE CHECK
Battery Remaining Longevity: 43 mo
Battery Voltage: 2.93 V
Brady Statistic AP VP Percent: 78.9 %
Brady Statistic AP VS Percent: 0.38 %
Brady Statistic AS VP Percent: 19.8 %
Brady Statistic AS VS Percent: 0.92 %
Brady Statistic RA Percent Paced: 79.97 %
Brady Statistic RV Percent Paced: 98.7 %
Date Time Interrogation Session: 20240522012210
Implantable Lead Connection Status: 753985
Implantable Lead Connection Status: 753985
Implantable Lead Implant Date: 20190211
Implantable Lead Implant Date: 20220908
Implantable Lead Location: 753859
Implantable Lead Location: 753860
Implantable Lead Model: 3830
Implantable Lead Model: 5076
Implantable Pulse Generator Implant Date: 20190211
Lead Channel Impedance Value: 228 Ohm
Lead Channel Impedance Value: 361 Ohm
Lead Channel Impedance Value: 380 Ohm
Lead Channel Impedance Value: 513 Ohm
Lead Channel Pacing Threshold Amplitude: 1.875 V
Lead Channel Pacing Threshold Amplitude: 2.5 V
Lead Channel Pacing Threshold Pulse Width: 0.4 ms
Lead Channel Pacing Threshold Pulse Width: 0.4 ms
Lead Channel Sensing Intrinsic Amplitude: 0.375 mV
Lead Channel Sensing Intrinsic Amplitude: 0.375 mV
Lead Channel Sensing Intrinsic Amplitude: 1.5 mV
Lead Channel Sensing Intrinsic Amplitude: 1.5 mV
Lead Channel Setting Pacing Amplitude: 2 V
Lead Channel Setting Pacing Amplitude: 2.5 V
Lead Channel Setting Pacing Pulse Width: 0.4 ms
Lead Channel Setting Sensing Sensitivity: 0.6 mV
Zone Setting Status: 755011
Zone Setting Status: 755011

## 2023-02-02 NOTE — Progress Notes (Signed)
Remote pacemaker transmission.   

## 2023-02-12 ENCOUNTER — Other Ambulatory Visit: Payer: Self-pay | Admitting: Physician Assistant

## 2023-04-13 ENCOUNTER — Ambulatory Visit (INDEPENDENT_AMBULATORY_CARE_PROVIDER_SITE_OTHER): Payer: Medicare HMO

## 2023-04-13 DIAGNOSIS — I442 Atrioventricular block, complete: Secondary | ICD-10-CM

## 2023-04-13 LAB — CUP PACEART REMOTE DEVICE CHECK
Battery Remaining Longevity: 29 mo
Battery Voltage: 2.92 V
Brady Statistic AP VP Percent: 70.93 %
Brady Statistic AP VS Percent: 0.8 %
Brady Statistic AS VP Percent: 26.71 %
Brady Statistic AS VS Percent: 1.56 %
Brady Statistic RA Percent Paced: 72.99 %
Brady Statistic RV Percent Paced: 97.64 %
Date Time Interrogation Session: 20240821012238
Implantable Lead Connection Status: 753985
Implantable Lead Connection Status: 753985
Implantable Lead Implant Date: 20190211
Implantable Lead Implant Date: 20220908
Implantable Lead Location: 753859
Implantable Lead Location: 753860
Implantable Lead Model: 3830
Implantable Lead Model: 5076
Implantable Pulse Generator Implant Date: 20190211
Lead Channel Impedance Value: 228 Ohm
Lead Channel Impedance Value: 342 Ohm
Lead Channel Impedance Value: 380 Ohm
Lead Channel Impedance Value: 494 Ohm
Lead Channel Pacing Threshold Amplitude: 1.875 V
Lead Channel Pacing Threshold Amplitude: 2.5 V
Lead Channel Pacing Threshold Pulse Width: 0.4 ms
Lead Channel Pacing Threshold Pulse Width: 0.4 ms
Lead Channel Sensing Intrinsic Amplitude: 0.625 mV
Lead Channel Sensing Intrinsic Amplitude: 0.625 mV
Lead Channel Sensing Intrinsic Amplitude: 0.875 mV
Lead Channel Sensing Intrinsic Amplitude: 0.875 mV
Lead Channel Setting Pacing Amplitude: 2 V
Lead Channel Setting Pacing Amplitude: 2.5 V
Lead Channel Setting Pacing Pulse Width: 0.4 ms
Lead Channel Setting Sensing Sensitivity: 0.6 mV
Zone Setting Status: 755011
Zone Setting Status: 755011

## 2023-04-26 NOTE — Progress Notes (Signed)
Remote pacemaker transmission.   

## 2023-05-26 ENCOUNTER — Other Ambulatory Visit: Payer: Self-pay | Admitting: Cardiology

## 2023-05-26 DIAGNOSIS — I480 Paroxysmal atrial fibrillation: Secondary | ICD-10-CM

## 2023-05-26 NOTE — Telephone Encounter (Signed)
Prescription refill request for Eliquis received. Indication: AF Last office visit: 09/17/22  Harriet Pho NP Scr: 1.50 Age: 89 Weight: 73.6kg  Based on above finding Eliquis 2.5mg  twice daily is the appropriate dose.  Refill approved.

## 2023-06-21 ENCOUNTER — Other Ambulatory Visit: Payer: Self-pay | Admitting: Cardiology

## 2023-07-13 ENCOUNTER — Ambulatory Visit (INDEPENDENT_AMBULATORY_CARE_PROVIDER_SITE_OTHER)

## 2023-07-13 DIAGNOSIS — I442 Atrioventricular block, complete: Secondary | ICD-10-CM

## 2023-07-13 LAB — CUP PACEART REMOTE DEVICE CHECK
Battery Remaining Longevity: 27 mo
Battery Voltage: 2.91 V
Brady Statistic AP VP Percent: 73.71 %
Brady Statistic AP VS Percent: 0.7 %
Brady Statistic AS VP Percent: 24.22 %
Brady Statistic AS VS Percent: 1.36 %
Brady Statistic RA Percent Paced: 75.53 %
Brady Statistic RV Percent Paced: 97.93 %
Date Time Interrogation Session: 20241120002343
Implantable Lead Connection Status: 753985
Implantable Lead Connection Status: 753985
Implantable Lead Implant Date: 20190211
Implantable Lead Implant Date: 20220908
Implantable Lead Location: 753859
Implantable Lead Location: 753860
Implantable Lead Model: 3830
Implantable Lead Model: 5076
Implantable Pulse Generator Implant Date: 20190211
Lead Channel Impedance Value: 247 Ohm
Lead Channel Impedance Value: 361 Ohm
Lead Channel Impedance Value: 399 Ohm
Lead Channel Impedance Value: 513 Ohm
Lead Channel Pacing Threshold Amplitude: 1.875 V
Lead Channel Pacing Threshold Amplitude: 2.5 V
Lead Channel Pacing Threshold Pulse Width: 0.4 ms
Lead Channel Pacing Threshold Pulse Width: 0.4 ms
Lead Channel Sensing Intrinsic Amplitude: 0.375 mV
Lead Channel Sensing Intrinsic Amplitude: 0.375 mV
Lead Channel Sensing Intrinsic Amplitude: 1.375 mV
Lead Channel Sensing Intrinsic Amplitude: 1.375 mV
Lead Channel Setting Pacing Amplitude: 2 V
Lead Channel Setting Pacing Amplitude: 2.5 V
Lead Channel Setting Pacing Pulse Width: 0.4 ms
Lead Channel Setting Sensing Sensitivity: 0.6 mV
Zone Setting Status: 755011
Zone Setting Status: 755011

## 2023-08-08 NOTE — Progress Notes (Signed)
Remote pacemaker transmission.   

## 2023-08-12 ENCOUNTER — Telehealth: Payer: Self-pay

## 2023-08-12 NOTE — Telephone Encounter (Signed)
Pt passed away. I canceled all upcoming remote appointments. I marked him inactive in [place] and took him out of [facility]. I order a return Kit as well.

## 2023-08-24 DEATH — deceased
